# Patient Record
Sex: Male | Born: 1945
Health system: Southern US, Community
[De-identification: ages and names within clinical notes are randomized; demographics above are authoritative.]

## PROBLEM LIST (undated history)

## (undated) DIAGNOSIS — I5042 Chronic combined systolic (congestive) and diastolic (congestive) heart failure: Secondary | ICD-10-CM

## (undated) DIAGNOSIS — I10 Essential (primary) hypertension: Secondary | ICD-10-CM

## (undated) DIAGNOSIS — I214 Non-ST elevation (NSTEMI) myocardial infarction: Secondary | ICD-10-CM

## (undated) DIAGNOSIS — I509 Heart failure, unspecified: Secondary | ICD-10-CM

## (undated) DIAGNOSIS — N189 Chronic kidney disease, unspecified: Secondary | ICD-10-CM

## (undated) DIAGNOSIS — E785 Hyperlipidemia, unspecified: Secondary | ICD-10-CM

## (undated) DIAGNOSIS — N183 Chronic kidney disease, stage 3 unspecified: Secondary | ICD-10-CM

## (undated) DIAGNOSIS — L97919 Non-pressure chronic ulcer of unspecified part of right lower leg with unspecified severity: Secondary | ICD-10-CM

## (undated) DIAGNOSIS — I2510 Atherosclerotic heart disease of native coronary artery without angina pectoris: Secondary | ICD-10-CM

## (undated) DIAGNOSIS — D649 Anemia, unspecified: Secondary | ICD-10-CM

## (undated) DIAGNOSIS — I739 Peripheral vascular disease, unspecified: Secondary | ICD-10-CM

## (undated) DIAGNOSIS — N179 Acute kidney failure, unspecified: Secondary | ICD-10-CM

## (undated) HISTORY — DX: Atherosclerotic heart disease of native coronary artery without angina pectoris: I25.10

## (undated) HISTORY — DX: Hyperlipidemia, unspecified: E78.5

## (undated) HISTORY — DX: Essential (primary) hypertension: I10

## (undated) HISTORY — DX: Chronic combined systolic (congestive) and diastolic (congestive) heart failure: I50.42

## (undated) HISTORY — DX: Anemia, unspecified: D64.9

## (undated) HISTORY — DX: Peripheral vascular disease, unspecified: I73.9

## (undated) HISTORY — DX: Chronic kidney disease, stage 3 unspecified: N18.30

## (undated) HISTORY — DX: Non-ST elevation (NSTEMI) myocardial infarction: I21.4

## (undated) HISTORY — PX: COLONOSCOPY: SHX5424

## (undated) HISTORY — DX: Non-pressure chronic ulcer of unspecified part of right lower leg with unspecified severity: L97.919

---
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
## 2020-01-05 ENCOUNTER — Other Ambulatory Visit: Payer: Self-pay

## 2020-01-05 ENCOUNTER — Ambulatory Visit (INDEPENDENT_AMBULATORY_CARE_PROVIDER_SITE_OTHER): Payer: Medicare Other | Admitting: Primary Care

## 2020-01-05 ENCOUNTER — Encounter: Payer: Self-pay | Admitting: Primary Care

## 2020-01-05 ENCOUNTER — Other Ambulatory Visit (INDEPENDENT_AMBULATORY_CARE_PROVIDER_SITE_OTHER): Payer: Self-pay | Admitting: Vascular Surgery

## 2020-01-05 ENCOUNTER — Ambulatory Visit (INDEPENDENT_AMBULATORY_CARE_PROVIDER_SITE_OTHER): Payer: Medicare Other | Admitting: Cardiovascular Disease

## 2020-01-05 ENCOUNTER — Encounter: Payer: Self-pay | Admitting: Cardiovascular Disease

## 2020-01-05 VITALS — BP 180/120 | HR 77 | Temp 97.2°F | Ht 71.0 in | Wt 144.2 lb

## 2020-01-05 VITALS — BP 140/80 | HR 68 | Ht 71.0 in | Wt 138.0 lb

## 2020-01-05 DIAGNOSIS — R7989 Other specified abnormal findings of blood chemistry: Secondary | ICD-10-CM | POA: Insufficient documentation

## 2020-01-05 DIAGNOSIS — I252 Old myocardial infarction: Secondary | ICD-10-CM | POA: Diagnosis not present

## 2020-01-05 DIAGNOSIS — I10 Essential (primary) hypertension: Secondary | ICD-10-CM

## 2020-01-05 DIAGNOSIS — L97909 Non-pressure chronic ulcer of unspecified part of unspecified lower leg with unspecified severity: Secondary | ICD-10-CM

## 2020-01-05 DIAGNOSIS — I739 Peripheral vascular disease, unspecified: Secondary | ICD-10-CM | POA: Insufficient documentation

## 2020-01-05 DIAGNOSIS — I509 Heart failure, unspecified: Secondary | ICD-10-CM | POA: Diagnosis not present

## 2020-01-05 DIAGNOSIS — N183 Chronic kidney disease, stage 3 unspecified: Secondary | ICD-10-CM | POA: Insufficient documentation

## 2020-01-05 DIAGNOSIS — N189 Chronic kidney disease, unspecified: Secondary | ICD-10-CM

## 2020-01-05 DIAGNOSIS — L97912 Non-pressure chronic ulcer of unspecified part of right lower leg with fat layer exposed: Secondary | ICD-10-CM | POA: Diagnosis not present

## 2020-01-05 DIAGNOSIS — I5022 Chronic systolic (congestive) heart failure: Secondary | ICD-10-CM

## 2020-01-05 DIAGNOSIS — I5030 Unspecified diastolic (congestive) heart failure: Secondary | ICD-10-CM | POA: Insufficient documentation

## 2020-01-05 LAB — LIPID PANEL
Cholesterol: 133 mg/dL (ref 0–200)
HDL: 43.7 mg/dL (ref >39.00)
LDL Cholesterol: 77 mg/dL (ref 0–99)
NonHDL: 89.01
Total CHOL/HDL Ratio: 3
Triglycerides: 62 mg/dL (ref 0.0–149.0)
VLDL: 12.4 mg/dL (ref 0.0–40.0)

## 2020-01-05 LAB — COMPREHENSIVE METABOLIC PANEL
ALT: 14 U/L (ref 0–53)
AST: 22 U/L (ref 0–37)
Albumin: 3 g/dL — ABNORMAL LOW (ref 3.5–5.2)
Alkaline Phosphatase: 55 U/L (ref 39–117)
BUN: 36 mg/dL — ABNORMAL HIGH (ref 6–23)
CO2: 26 mEq/L (ref 19–32)
Calcium: 8.7 mg/dL (ref 8.4–10.5)
Chloride: 111 mEq/L (ref 96–112)
Creatinine, Ser: 2.03 mg/dL — ABNORMAL HIGH (ref 0.40–1.50)
GFR: 39.07 mL/min — ABNORMAL LOW (ref >60.00)
Glucose, Bld: 93 mg/dL (ref 70–99)
Potassium: 4.1 mEq/L (ref 3.5–5.1)
Sodium: 144 mEq/L (ref 135–145)
Total Bilirubin: 0.3 mg/dL (ref 0.2–1.2)
Total Protein: 7.9 g/dL (ref 6.0–8.3)

## 2020-01-05 LAB — CBC WITH DIFFERENTIAL/PLATELET
Basophils Absolute: 0 10*3/uL (ref 0.0–0.1)
Basophils Relative: 0.8 % (ref 0.0–3.0)
Eosinophils Absolute: 0.1 10*3/uL (ref 0.0–0.7)
Eosinophils Relative: 3 % (ref 0.0–5.0)
HCT: 28.6 % — ABNORMAL LOW (ref 39.0–52.0)
Hemoglobin: 8.9 g/dL — ABNORMAL LOW (ref 13.0–17.0)
Lymphocytes Relative: 27.5 % (ref 12.0–46.0)
Lymphs Abs: 1.3 10*3/uL (ref 0.7–4.0)
MCHC: 31 g/dL (ref 30.0–36.0)
MCV: 78.2 fl (ref 78.0–100.0)
Monocytes Absolute: 0.6 10*3/uL (ref 0.1–1.0)
Monocytes Relative: 11.7 % (ref 3.0–12.0)
Neutro Abs: 2.7 10*3/uL (ref 1.4–7.7)
Neutrophils Relative %: 57 % (ref 43.0–77.0)
Platelets: 268 10*3/uL (ref 150.0–400.0)
RBC: 3.66 Mil/uL — ABNORMAL LOW (ref 4.22–5.81)
RDW: 21.3 % — ABNORMAL HIGH (ref 11.5–15.5)
WBC: 4.8 10*3/uL (ref 4.0–10.5)

## 2020-01-05 LAB — TSH: TSH: 4.17 u[IU]/mL (ref 0.35–4.50)

## 2020-01-05 MED ORDER — ASPIRIN EC 81 MG PO TBEC: 81.0000 mg | DELAYED_RELEASE_TABLET | Freq: Every day | ORAL | Status: DC

## 2020-01-05 MED ORDER — AMLODIPINE BESYLATE 5 MG PO TABS: 5.0000 mg | ORAL_TABLET | Freq: Every day | ORAL | 5 refills | Status: DC

## 2020-01-05 NOTE — Assessment & Plan Note (Addendum)
Chronic for years, previously following with vascular services around 2007, no evaluation since.  Numerous ulcers to lower extremities, worse to right side. No obvious infection today. Referral placed to wound center for ulcer care, referral placed to vascular services for evaluation.  Will scan in LE arterial seg pressure report.

## 2020-01-05 NOTE — Assessment & Plan Note (Signed)
Uncontrolled today on current regimen. No recent renal function studies on file. History of NSTEMI with AKI and underlying (untreated CKD).  Stat referral to cardiology, will be seen today. Labs pending.

## 2020-01-05 NOTE — Assessment & Plan Note (Signed)
Undiagnosed until just recently. Renal US reviewed from hospital stay. Suspect CKD is secondary to uncontrolled hypertension.  Renal function testing pending. Patient to see cardiology today, we need to gain better BP control but given his recent history will defer to cards. He may need nephrology evaluation.

## 2020-01-05 NOTE — Patient Instructions (Signed)
Medication Instructions:  Your physician has recommended you make the following change in your medication:   START Aspirin 81 mg daily. This can be purchased over the counter.  START Amlodipine 5 mg daily. An Rx has been sent to your pharmacy.   *If you need a refill on your cardiac medications before your next appointment, please call your pharmacy*   Lab Work: None ordered If you have labs (blood work) drawn today and your tests are completely normal, you will receive your results only by: [name] MyChart Message (if you have MyChart) OR . A paper copy in the mail If you have any lab test that is abnormal or we need to change your treatment, we will call you to review the results.   Testing/Procedures: None ordered   Follow-Up: At [facility], you and your health needs are our priority.  As part of our continuing mission to provide you with exceptional heart care, we have created designated Provider Care Teams.  These Care Teams include your primary Cardiologist (physician) and Advanced Practice Providers (APPs -  Physician Assistants and Nurse Practitioners) who all work together to provide you with the care you need, when you need it.  We recommend signing up for the patient portal called "MyChart".  Sign up information is provided on this After Visit Summary.  MyChart is used to connect with patients for Virtual Visits (Telemedicine).  Patients are able to view lab/test results, encounter notes, upcoming appointments, etc.  Non-urgent messages can be sent to your provider as well.   To learn more about what you can do with MyChart, go to [email or web address].    Your next appointment:   2 month(s)  The format for your next appointment:   In Person  Provider:    You may see  Dr. [name] or one of the following Advanced Practice Providers on your designated Care Team:    Murray Hodgkins, NP  Christell Faith, PA-C  Marrianne Mood, PA-C    Other Instructions N/A

## 2020-01-05 NOTE — Assessment & Plan Note (Addendum)
Diagnosed during recent hospital admission in [place], echocardiogram reviewed. No recent use of furosemide per patient due to symptoms of frequent urination.   Continue carvedilol and isosorbide mononitrate.   Patient to see cardiology this afternoon.

## 2020-01-05 NOTE — Progress Notes (Signed)
Cardiology Office Note   Date:  01/05/2020   ID:  Narayan Scull, DOB 11/14/1945, MRN [id number]  PCP:  [name], NP  Cardiologist:   [name], MD   Chief Complaint  Patient presents with  . New Patient (Initial Visit)    Patient c/o some palpitations at time in his chest and SOB when walking. Meds reviewed verbally with patient.       History of Present Illness: Azariah Bonura is a 74 y.o. male who was referred by Alma Friendly to establish cardiovascular care.  The patient moved from [place] to [place] to be with his sister.  She had self-neglect there as he is nearly blind.  He also has history of peripheral arterial disease with lower extremity ulceration but ABI in March was normal. The patient did not seek medical attention for many years at least since 2007 and was living in a poor living condition by himself. He was diagnosed with heart failure in [place] and March 2021 with an EF of 40 to 45%.  He did have non-ST elevation myocardial infarction but was treated medically.  He does have chronic kidney disease with creatinine of 2.77.  His initial presentation was with a possible syncopal episode.  He was noted to be anemic and underwent an EGD which showed moderately erythematous stomach mucosa.  Colonoscopy showed internal hemorrhoids.  It was not clear if he had acute kidney injury or whether he had chronic kidney disease. The patient also has chronic ulceration of the right lower extremity for many years that has not been addressed. He is not a smoker and quit alcohol 12 years ago.  He is not diabetic.  He is retired and used to work in Tourist information centre manager.  He has no family history of coronary artery disease.   He has been doing reasonably well overall with no recent chest pain or leg edema.  He reports stable exertional dyspnea.  Past Medical History:  Diagnosis Date  . AKI (acute kidney injury) ([place])   . CHF (congestive heart failure) ([place])   . Chronic  ulcer of right lower extremity ([place])   . CKD (chronic kidney disease)   . NSTEMI (non-ST elevated myocardial infarction) ([place])   . PVD (peripheral vascular disease) ([place])     History reviewed. No pertinent surgical history.   Current Outpatient Medications  Medication Sig Dispense Refill  . carvedilol (COREG) 12.5 MG tablet Take 12.5 mg by mouth 2 (two) times daily with a meal.    . furosemide (LASIX) 40 MG tablet Take 40 mg by mouth.    . hydrALAZINE (APRESOLINE) 100 MG tablet Take 100 mg by mouth 2 (two) times daily.    . isosorbide mononitrate (IMDUR) 30 MG 24 hr tablet Take 30 mg by mouth daily.     No current facility-administered medications for this visit.    Allergies:   Patient has no known allergies.    Social History:  The patient  reports that he has never smoked. He has never used smokeless tobacco. He reports previous alcohol use.   Family History:  The patient's family history is negative for coronary artery disease.  There is family history of hypertension.   ROS:  Please see the history of present illness.   Otherwise, review of systems are positive for none.   All other systems are reviewed and negative.    PHYSICAL EXAM: VS:  BP 140/80 (BP Location: Right Arm, Patient Position: Sitting, Cuff Size: Normal)  Pulse 68   Ht 5\' 11"  (1.803 m)   Wt 138 lb (62.6 kg)   SpO2 90%   BMI 19.25 kg/m  , BMI Body mass index is 19.25 kg/m. GEN: Well nourished, well developed, in no acute distress  HEENT: normal  Neck: no JVD, carotid bruits, or masses Cardiac: RRR; no murmurs, rubs, or gallops,no edema  Respiratory:  clear to auscultation bilaterally, normal work of breathing GI: soft, nontender, nondistended, + BS MS: no deformity or atrophy  Skin: warm and dry, no rash Neuro:  Strength and sensation are intact Psych: euthymic mood, full affect   EKG:  EKG is ordered today. The ekg ordered today demonstrates sinus rhythm with PACs, LVH with repolarization  abnormalities.   Recent Labs: No results found for requested labs within last 8760 hours.    Lipid Panel No results found for: CHOL, TRIG, HDL, CHOLHDL, VLDL, LDLCALC, LDLDIRECT    Wt Readings from Last 3 Encounters:  01/05/20 138 lb (62.6 kg)  01/05/20 144 lb 4 oz (65.4 kg)      Other studies Reviewed: Additional studies/ records that were reviewed today include: Reviewed records from his hospitalization. Review of the above records demonstrates: Summarized above  No flowsheet data found.    ASSESSMENT AND PLAN:  1.  Chronic systolic heart failure: Likely due to hypertensive heart disease.  Ischemic cardiomyopathy cannot be completely excluded.  He is not a good candidate for invasive cardiac evaluation given advanced chronic kidney disease.  In addition, he has no convincing symptoms of angina at the present time.  I recommend continued medical therapy for now.  Continue treatment with carvedilol 12.5 mg twice daily, hydralazine 100 mg twice daily and Imdur 30 mg once daily.  No ACE inhibitors, ARB's or aldosterone blocker medications due to advanced chronic kidney disease. Continue current dose of furosemide 40 mg once daily I instructed him to start aspirin 81 mg once daily.  2.  Essential hypertension: Blood pressure is still not optimally controlled.  I elected to add amlodipine 5 mg once daily.  3.  Possible hyperlipidemia.  Will await the results of blood work with low threshold to start treatment with a statin given reported recent non-ST elevation myocardial infarction.   Disposition:   FU with me in 2 months  Signed,  [name], MD  01/05/2020 3:17 PM    [place]

## 2020-01-05 NOTE — Assessment & Plan Note (Signed)
Noted on labs from 12/14/19 from hospital stay, TSH of 4.5. repeat TSH pending. No known history of thyroid disease.

## 2020-01-05 NOTE — Assessment & Plan Note (Signed)
Recent hospital admission in [place], we have very little records, but will scan in what we have.  No evidence of cardiac catheterization.  Referral placed to cardiology, he will be seeing them later today.

## 2020-01-05 NOTE — Assessment & Plan Note (Signed)
Untreated for years, no obvious infection today. He has completed antibiotic treatment. Referral placed to vascular services and wound care services.

## 2020-01-05 NOTE — Progress Notes (Signed)
Subjective:    Patient ID: Michael Chang, male    DOB: October 11, 1945, 74 y.o.   MRN: [id number]  HPI  This visit occurred during the SARS-CoV-2 public health emergency.  Safety protocols were in place, including screening questions prior to the visit, additional usage of staff PPE, and extensive cleaning of exam room while observing appropriate contact time as indicated for disinfecting solutions.   Michael Chang is a 74 year old male who presents today to establish care and discuss the problems mentioned below. Will obtain/review records.   He is here with Michael Chang who is providing most of the information for HPI. She moved him from Michael home in [place] to her home here in [place] due to self neglect while living by himself. The patient is nearly blind, cannot take care of himself.   1) CHF: Appears to be new diagnosis from hospital stay in [place]. Echocardiogram from March 2021 with LVEF of 40-45%, increased left atrial pressure with reduced LV compliance, mild to moderate mitral regurgitation and tricuspid regurgitation. Chest xray with pulmonary edema. Currently managed on furosemide 40 mg. Diagnosed with NSTEMI at the time.   He is not taking furosemide as it causes him to have to urinate often. He does not have a local cardiologist. Compliant to carvedilol, isosorbide mononitrate. He denies lower extremity edema.   2) CKD: New diagnosis but appears to be a chronic issue. Creatinine of 2.77 with GFR of 27 from December 14, 2019. No recent labs. Hypertensive in the office today, no known diabetes. Renal ultrasound with bilateral renal cysts, and evidence of renal disease.   3) PVD/Chronic Ulcer: Chronic ulcer to right lower extremity that has been present since 1997. ABI's from hospital stay in March 2021 negative for arterial disease. Xray of right ankle without osteomyelitis   No medical care since 2007. He typically covers the site with pads and covers with neosporin. Treated with Amoxicillin  BID and clarithromycin BID x 14 days. Michael Chang endorses that Michael wound does appear better. He has another minor wound to right lateral ankle and left medial ankle.   4) Syncope/NSTEMI: Recent episode of syncope occurring at a [employer] , fell and hit Michael head on the refrigerator, woke up seconds later. Witnessed by bystandards. Patient evaluated at [facility] on 12/14/19. Admitted for syncope, NSTEMI, AKI, Hypertensive Urgency. Also noted to be covered in bed bugs upon arrival.  We have very little records from this visit but he was admitted to [facility] on 12/14/19, discharged home on 12/21/19 and moved in with Michael Chang who is Michael caregiver. He underwent upper and lower endoscopy, gastric polyps removed. Internal hemorrhoids on colonoscopy, otherwise unremarkable and negative for GI bleed. No evidence of cardiac catheterization from records.   5) Essential Hypertension: Currently managed on carvedilol 12.5 mg BID, hydralazine 100 mg BID, isosorbide mononitrite ER 30 mg daily. These medications were initiated during Michael hospital stay in [place]. Currently does not follow with cardiology, has not had medical care in over 10 years. Michael Chang is checking Michael BP at home which is running 170's-220/80's-110's.   BP Readings from Last 3 Encounters:  01/05/20 (!) 180/120      Review of Systems  Eyes:       Nearly blind  Respiratory: Negative for shortness of breath.   Cardiovascular: Negative for chest pain.  Gastrointestinal: Negative for abdominal pain.  Musculoskeletal: Positive for back pain.  Skin: Positive for wound.  Neurological: Negative for headaches.       Past Medical History:  Diagnosis Date  . AKI (acute kidney injury) ([place])   . CHF (congestive heart failure) ([place])   . Chronic ulcer of right lower extremity ([place])   . CKD (chronic kidney disease)   . NSTEMI (non-ST elevated myocardial infarction) ([place])   . PVD (peripheral  vascular disease) ([place])      Social History   Socioeconomic History  . Marital status: Single    Spouse name: Not on file  . Number of children: Not on file  . Years of education: Not on file  . Highest education level: Not on file  Occupational History  . Not on file  Tobacco Use  . Smoking status: Never Smoker  . Smokeless tobacco: Never Used  Substance and Sexual Activity  . Alcohol use: Not Currently  . Drug use: Not on file  . Sexual activity: Not on file  Other Topics Concern  . Not on file  Social History Narrative  . Not on file   Social Determinants of Health   Financial Resource Strain:   . Difficulty of Paying Living Expenses:   Food Insecurity:   . Worried About Charity fundraiser in the Last Year:   . Arboriculturist in the Last Year:   Transportation Needs:   . Film/video editor (Medical):   [name] Lack of Transportation (Non-Medical):   Physical Activity:   . Days of Exercise per Week:   . Minutes of Exercise per Session:   Stress:   . Feeling of Stress :   Social Connections:   . Frequency of Communication with Friends and Family:   . Frequency of Social Gatherings with Friends and Family:   . Attends Religious Services:   . Active Member of Clubs or Organizations:   . Attends Archivist Meetings:   [name] Marital Status:   Intimate Partner Violence:   . Fear of Current or Ex-Partner:   . Emotionally Abused:   [name] Physically Abused:   . Sexually Abused:     History reviewed. No pertinent surgical history.  No family history on file.  No Known Allergies  Current Outpatient Medications on File Prior to Visit  Medication Sig Dispense Refill  . amoxicillin (AMOXIL) 500 MG tablet Take 500 mg by mouth 2 (two) times daily.    . carvedilol (COREG) 12.5 MG tablet Take 12.5 mg by mouth 2 (two) times daily with a meal.    . furosemide (LASIX) 40 MG tablet Take 40 mg by mouth.    . hydrALAZINE (APRESOLINE) 100 MG tablet Take 100 mg by mouth 2  (two) times daily.    . isosorbide mononitrate (IMDUR) 30 MG 24 hr tablet Take 30 mg by mouth daily.     No current facility-administered medications on file prior to visit.    BP (!) 180/120   Pulse 77   Temp (!) 97.2 F (36.2 C) (Temporal)   Ht 5\' 11"  (1.803 m)   Wt 144 lb 4 oz (65.4 kg)   SpO2 98%   BMI 20.12 kg/m    Objective:   Physical Exam  Constitutional: He appears well-nourished.  Cardiovascular: Normal rate and regular rhythm.  Respiratory: Effort normal and breath sounds normal.  Musculoskeletal:     Cervical back: Neck supple.  Neurological: He is alert.  Answers questions correctly   Skin:  Moderately sized ulcer to right medial ankle with fat layer exposure. No foul smell.  Smaller superficial ulcer to right lateral ankle.   Psychiatric: He has a normal mood and affect.           Assessment & Plan:

## 2020-01-05 NOTE — Patient Instructions (Signed)
Stop by the lab prior to leaving today. I will notify you of your results once received.   Stop by the front desk and speak with either Rosaria Ferries or Charmaine regarding your referral to Cardiology.  It was a pleasure to meet you today! Please don't hesitate to call or message me with any questions. Welcome to [facility]!

## 2020-01-07 ENCOUNTER — Ambulatory Visit (INDEPENDENT_AMBULATORY_CARE_PROVIDER_SITE_OTHER): Payer: Medicare Other

## 2020-01-07 ENCOUNTER — Encounter: Payer: Medicare Other | Attending: Physician Assistant | Admitting: Physician Assistant

## 2020-01-07 ENCOUNTER — Other Ambulatory Visit: Payer: Self-pay

## 2020-01-07 ENCOUNTER — Encounter (INDEPENDENT_AMBULATORY_CARE_PROVIDER_SITE_OTHER): Payer: Self-pay | Admitting: Vascular Surgery

## 2020-01-07 ENCOUNTER — Ambulatory Visit (INDEPENDENT_AMBULATORY_CARE_PROVIDER_SITE_OTHER): Payer: Medicare Other | Admitting: Vascular Surgery

## 2020-01-07 VITALS — BP 144/75 | HR 44 | Ht 71.0 in | Wt <= 1120 oz

## 2020-01-07 DIAGNOSIS — I890 Lymphedema, not elsewhere classified: Secondary | ICD-10-CM | POA: Diagnosis not present

## 2020-01-07 DIAGNOSIS — I2510 Atherosclerotic heart disease of native coronary artery without angina pectoris: Secondary | ICD-10-CM | POA: Insufficient documentation

## 2020-01-07 DIAGNOSIS — I739 Peripheral vascular disease, unspecified: Secondary | ICD-10-CM

## 2020-01-07 DIAGNOSIS — L97312 Non-pressure chronic ulcer of right ankle with fat layer exposed: Secondary | ICD-10-CM | POA: Insufficient documentation

## 2020-01-07 DIAGNOSIS — L97522 Non-pressure chronic ulcer of other part of left foot with fat layer exposed: Secondary | ICD-10-CM | POA: Diagnosis present

## 2020-01-07 DIAGNOSIS — L97909 Non-pressure chronic ulcer of unspecified part of unspecified lower leg with unspecified severity: Secondary | ICD-10-CM | POA: Diagnosis not present

## 2020-01-07 DIAGNOSIS — I25118 Atherosclerotic heart disease of native coronary artery with other forms of angina pectoris: Secondary | ICD-10-CM | POA: Diagnosis not present

## 2020-01-07 DIAGNOSIS — I10 Essential (primary) hypertension: Secondary | ICD-10-CM

## 2020-01-07 DIAGNOSIS — N186 End stage renal disease: Secondary | ICD-10-CM | POA: Diagnosis not present

## 2020-01-07 DIAGNOSIS — I872 Venous insufficiency (chronic) (peripheral): Secondary | ICD-10-CM | POA: Insufficient documentation

## 2020-01-07 DIAGNOSIS — I120 Hypertensive chronic kidney disease with stage 5 chronic kidney disease or end stage renal disease: Secondary | ICD-10-CM | POA: Diagnosis not present

## 2020-01-07 DIAGNOSIS — L97529 Non-pressure chronic ulcer of other part of left foot with unspecified severity: Secondary | ICD-10-CM

## 2020-01-07 DIAGNOSIS — L97519 Non-pressure chronic ulcer of other part of right foot with unspecified severity: Secondary | ICD-10-CM | POA: Insufficient documentation

## 2020-01-07 NOTE — Progress Notes (Signed)
MRN : [id number]  Michael Chang is a 74 y.o. (04/08/1946) male who presents with chief complaint of  Chief Complaint  Patient presents with  . Follow-up    ultrasound follow up   .  History of Present Illness:   The patient is seen for evaluation of painful lower extremities and diminished pulses associated with ulceration of the foot.  The patient notes the ulcer has been present for multiple weeks and has not been improving.  It is very painful and has had some drainage.  No specific history of trauma noted by the patient.  The patient denies fever or chills.  the patient does have diabetes which has been difficult to control.  Patient notes prior to the ulcer developing the extremities were painful particularly with ambulation or activity and the discomfort is very consistent day today. Typically, the pain occurs at less than one block, progress is as activity continues to the point that the patient must stop walking. Resting including standing still for several minutes allowed resumption of the activity and the ability to walk a similar distance before stopping again. Uneven terrain and inclined shorten the distance. The pain has been progressive over the past several years.   The patient denies rest pain or dangling of an extremity off the side of the bed during the night for relief. No prior interventions or surgeries.  No history of back problems or DJD of the lumbar sacral spine.   The patient denies amaurosis fugax or recent TIA symptoms. There are no recent neurological changes noted. The patient denies history of DVT, PE or superficial thrombophlebitis. The patient denies recent episodes of angina or shortness of breath.   Current Meds  Medication Sig  . amLODipine (NORVASC) 5 MG tablet Take 1 tablet (5 mg total) by mouth daily.  [name] aspirin EC 81 MG tablet Take 1 tablet (81 mg total) by mouth daily.  . carvedilol (COREG) 12.5 MG tablet Take 12.5 mg by mouth 2 (two) times  daily with a meal.  . furosemide (LASIX) 40 MG tablet Take 40 mg by mouth.  . hydrALAZINE (APRESOLINE) 100 MG tablet Take 100 mg by mouth 2 (two) times daily.  . isosorbide mononitrate (IMDUR) 30 MG 24 hr tablet Take 30 mg by mouth daily.    Past Medical History:  Diagnosis Date  . AKI (acute kidney injury) ([place])   . CHF (congestive heart failure) ([place])   . Chronic ulcer of right lower extremity ([place])   . CKD (chronic kidney disease)   . NSTEMI (non-ST elevated myocardial infarction) ([place])   . PVD (peripheral vascular disease) ([place])     No past surgical history on file.  Social History Social History   Tobacco Use  . Smoking status: Never Smoker  . Smokeless tobacco: Never Used  Substance Use Topics  . Alcohol use: Not Currently  . Drug use: Not on file    Family History No family history of bleeding/clotting disorders, porphyria or autoimmune disease  No Known Allergies   REVIEW OF SYSTEMS (Negative unless checked)  Constitutional: [] Weight loss  [] Fever  [] Chills Cardiac: [] Chest pain   [] Chest pressure   [] Palpitations   [] Shortness of breath when laying flat   [] Shortness of breath with exertion. Vascular:  [] Pain in legs with walking   [x] Pain in legs at rest  [] History of DVT   [] Phlebitis   [] Swelling in legs   [] Varicose veins   [x] Non-healing ulcers Pulmonary:   [] Uses home oxygen   []   Productive cough   [] Hemoptysis   [] Wheeze  [] COPD   [] Asthma Neurologic:  [] Dizziness   [] Seizures   [] History of stroke   [] History of TIA  [] Aphasia   [] Vissual changes   [] Weakness or numbness in arm   [x] Weakness or numbness in leg Musculoskeletal:   [] Joint swelling   [x] Joint pain   [] Low back pain Hematologic:  [] Easy bruising  [] Easy bleeding   [] Hypercoagulable state   [] Anemic Gastrointestinal:  [] Diarrhea   [] Vomiting  [] Gastroesophageal reflux/heartburn   [] Difficulty swallowing. Genitourinary:  [] Chronic kidney disease   [] Difficult urination  [] Frequent urination    [] Blood in urine Skin:  [] Rashes   [] Ulcers  Psychological:  [] History of anxiety   []  History of major depression.  Physical Examination  Vitals:   01/07/20 1453  BP: (!) 144/75  Pulse: (!) 44  Weight: 70 lb (31.8 kg)  Height: 5\' 11"  (1.803 m)   Body mass index is 9.76 kg/m. Gen: WD/WN, NAD Head: [place]/AT, No temporalis wasting.  Ear/Nose/Throat: Hearing grossly intact, nares w/o erythema or drainage, poor dentition Eyes: PER, EOMI, sclera nonicteric.  Neck: Supple, no masses.  No bruit or JVD.  Pulmonary:  Good air movement, clear to auscultation bilaterally, no use of accessory muscles.  Cardiac: RRR, normal S1, S2, no Murmurs. Vascular: ulcers bilateral feet noninfected Vessel Right Left  Radial Palpable Palpable  PT Not Palpable Not Palpable  DP Not Palpable Not Palpable  Gastrointestinal: soft, non-distended. No guarding/no peritoneal signs.  Musculoskeletal: M/S 3/5 throughout.  No deformity but modest atrophy.  Neurologic: CN 2-12 intact. Pain and light touch intact in extremities.  Symmetrical.  Speech is fluent. Motor exam as listed above. Psychiatric: Judgment intact, Mood & affect appropriate for pt's clinical situation. Dermatologic: No rashes or ulcers noted.  No changes consistent with cellulitis.   CBC Lab Results  Component Value Date   WBC 4.8 01/05/2020   HGB 8.9 Repeated and verified X2. (L) 01/05/2020   HCT 28.6 (L) 01/05/2020   MCV 78.2 01/05/2020   PLT 268.0 01/05/2020    BMET    Component Value Date/Time   NA 144 01/05/2020 1028   K 4.1 01/05/2020 1028   CL 111 01/05/2020 1028   CO2 26 01/05/2020 1028   GLUCOSE 93 01/05/2020 1028   BUN 36 (H) 01/05/2020 1028   CREATININE 2.03 (H) 01/05/2020 1028   CALCIUM 8.7 01/05/2020 1028   Estimated Creatinine Clearance: 14.6 mL/min (A) (by C-G formula based on SCr of 2.03 mg/dL (H)).  COAG No results found for: INR, PROTIME  Radiology No results found.   Assessment/Plan 1. Skin ulcers of  foot, bilateral ([place]) Continue dressing as noted Will refer to Podiatry  2. PVD (peripheral vascular disease) ([place])  Recommend:  The patient has evidence of atherosclerosis of the lower extremities.  Noninvasive studies suggest good perfusion with triphasic signals bilaterally and good toe tracing.  The patient does not voice lifestyle limiting changes at this point in time.  No invasive studies, angiography or surgery at this time The patient should continue walking and begin a more formal exercise program.  The patient should continue antiplatelet therapy and aggressive treatment of the lipid abnormalities  No changes in the patient's medications at this time  3. Essential hypertension Continue antihypertensive medications as already ordered, these medications have been reviewed and there are no changes at this time.   4. Coronary artery disease of native artery of native heart with stable angina pectoris (HCC) Continue cardiac and antihypertensive medications  as already ordered and reviewed, no changes at this time.  Continue statin as ordered and reviewed, no changes at this time  Nitrates PRN for chest pain     Hortencia Pilar, MD  01/07/2020 3:17 PM

## 2020-01-08 ENCOUNTER — Telehealth: Payer: Self-pay | Admitting: Primary Care

## 2020-01-08 ENCOUNTER — Ambulatory Visit: Payer: Medicare Other | Admitting: Physician Assistant

## 2020-01-08 NOTE — Telephone Encounter (Signed)
Patient's sister returned your call  Unable to attache to lab encounter

## 2020-01-08 NOTE — Progress Notes (Signed)
BOBBI, KOZAKIEWICZ ([id number]) Visit Report for 01/07/2020 Abuse/Suicide Risk Screen Details Patient Name: Michael Chang, Michael Chang Date of Service: 01/07/2020 9:45 AM Medical Record Number: [id number] Patient Account Number: [id number] Date of Birth/Sex: [birth year] (74 y.o. M) Treating RN: Montey Hora Primary Care Mar Zettler: Alma Friendly Other Clinician: Referring Malaika Arnall: Alma Friendly Treating Derrisha Foos/Extender: Melburn Hake, HOYT Weeks in Treatment: 0 Abuse/Suicide Risk Screen Items Answer ABUSE RISK SCREEN: Has anyone close to you tried to hurt or harm you recentlyo No Do you feel uncomfortable with anyone in your familyo No Has anyone forced you do things that you didnot want to doo No Electronic Signature(s) Signed: 01/07/2020 4:13:45 PM By: Montey Hora Entered By: Montey Hora on 01/07/2020 09:49:41 Michael Chang ([id number]) -------------------------------------------------------------------------------- Activities of Daily Living Details Patient Name: Michael Chang Date of Service: 01/07/2020 9:45 AM Medical Record Number: [id number] Patient Account Number: [id number] Date of Birth/Sex: [birth year] (73 y.o. M) Treating RN: Montey Hora Primary Care Joliana Claflin: Alma Friendly Other Clinician: Referring Samina Weekes: Alma Friendly Treating Devean Skoczylas/Extender: Melburn Hake, HOYT Weeks in Treatment: 0 Activities of Daily Living Items Answer Activities of Daily Living (Please select one for each item) Drive Automobile Not Able Take Medications Need Assistance Use Telephone Completely Able Care for Appearance Completely Able Use Toilet Completely Able Bath / Shower Need Assistance Dress Self Completely Able Feed Self Completely Able Walk Need Assistance Get In / Out Bed Completely [place] for Self Need Assistance Electronic Signature(s) Signed: 01/07/2020 4:13:45 PM By: Montey Hora Entered  By: Montey Hora on 01/07/2020 09:50:37 Michael Chang ([id number]) -------------------------------------------------------------------------------- Education Screening Details Patient Name: Michael Chang Date of Service: 01/07/2020 9:45 AM Medical Record Number: [id number] Patient Account Number: [id number] Date of Birth/Sex: 1946/05/16 (73 y.o. M) Treating RN: Montey Hora Primary Care Leylani Duley: Alma Friendly Other Clinician: Referring Akita Maxim: Alma Friendly Treating Jashley Yellin/Extender: Melburn Hake, HOYT Weeks in Treatment: 0 Primary Learner Assessed: Patient Learning Preferences/Education Level/Primary Language Learning Preference: Explanation, Demonstration Highest Education Level: High School Preferred Language: English Cognitive Barrier Language Barrier: No Translator Needed: No Memory Deficit: No Emotional Barrier: No Cultural/Religious Beliefs Affecting Medical Care: No Physical Barrier Impaired Vision: No Impaired Hearing: No Decreased Hand dexterity: No Knowledge/Comprehension Knowledge Level: Medium Comprehension Level: Medium Ability to understand written instructions: Medium Ability to understand verbal instructions: Medium Motivation Anxiety Level: Calm Cooperation: Cooperative Education Importance: Acknowledges Need Interest in Health Problems: Asks Questions Perception: Coherent Willingness to Engage in Self-Management Medium Activities: Readiness to Engage in Self-Management Medium Activities: Electronic Signature(s) Signed: 01/07/2020 4:13:45 PM By: Montey Hora Entered By: Montey Hora on 01/07/2020 09:50:59 Michael Chang ([id number]) -------------------------------------------------------------------------------- Fall Risk Assessment Details Patient Name: Michael Chang Date of Service: 01/07/2020 9:45 AM Medical Record Number: [id number] Patient Account Number: [id number] Date of Birth/Sex: 07/29/1946 (73 y.o. M) Treating RN: Montey Hora Primary Care Lassie Demorest: Alma Friendly Other Clinician: Referring Jeanifer Halliday: Alma Friendly Treating Arrietty Dercole/Extender: Melburn Hake, HOYT Weeks in Treatment: 0 Fall Risk Assessment Items Have you had 2 or more falls in the last 12 monthso 0 No Have you had any fall that resulted in injury in the last 12 monthso 0 No FALLS RISK SCREEN History of falling - immediate or within 3 months 25 Yes Secondary diagnosis (Do you have 2 or more medical diagnoseso) 0 No Ambulatory aid None/bed rest/wheelchair/nurse 0 No Crutches/cane/walker 15 Yes Furniture 0 No Intravenous therapy Access/Saline/Heparin Lock 0 No Gait/Transferring Normal/ bed rest/ wheelchair 0 No Weak (short steps with or without shuffle, stooped but able to  lift head while walking, may seek 10 Yes support from furniture) Impaired (short steps with shuffle, may have difficulty arising from chair, head down, impaired 0 No balance) Mental Status Oriented to own ability 0 Yes Electronic Signature(s) Signed: 01/07/2020 4:13:45 PM By: Montey Hora Entered By: Montey Hora on 01/07/2020 09:55:21 Michael Chang ([id number]) -------------------------------------------------------------------------------- Nutrition Risk Screening Details Patient Name: Michael Chang Date of Service: 01/07/2020 9:45 AM Medical Record Number: [id number] Patient Account Number: [id number] Date of Birth/Sex: June 28, 1946 (73 y.o. M) Treating RN: Montey Hora Primary Care Markham Dumlao: Alma Friendly Other Clinician: Referring Marjean Imperato: Alma Friendly Treating Gailene Youkhana/Extender: Melburn Hake, HOYT Weeks in Treatment: 0 Height (in): Weight (lbs): Body Mass Index (BMI): Nutrition Risk Screening Items Score Screening NUTRITION RISK SCREEN: I have an illness or condition that made me change the kind and/or amount of food I eat 0 No I eat fewer than two meals per day 0 No I eat few fruits and vegetables, or milk products 0 No I have three or more  drinks of beer, liquor or wine almost every day 0 No I have tooth or mouth problems that make it hard for me to eat 0 No I don't always have enough money to buy the food I need 0 No I eat alone most of the time 0 No I take three or more different prescribed or over-the-counter drugs a day 1 Yes Without wanting to, I have lost or gained 10 pounds in the last six months 0 No I am not always physically able to shop, cook and/or feed myself 0 No Nutrition Protocols Good Risk Protocol 0 No interventions needed Moderate Risk Protocol High Risk Proctocol Risk Level: Good Risk Score: 1 Electronic Signature(s) Signed: 01/07/2020 4:13:45 PM By: Montey Hora Entered By: Montey Hora on 01/07/2020 09:55:31

## 2020-01-08 NOTE — Progress Notes (Addendum)
JJESUS, DINGLEY ([id number]) Visit Report for 01/07/2020 Allergy List Details Patient Name: Michael Chang, Michael Chang Date of Service: 01/07/2020 9:45 AM Medical Record Number: [id number] Patient Account Number: [id number] Date of Birth/Sex: 1946-04-12 (74 y.o. M) Treating RN: Montey Hora Primary Care Aranza Geddes: Alma Friendly Other Clinician: Referring Jemmie Ledgerwood: Alma Friendly Treating Arham Symmonds/Extender: Melburn Hake, HOYT Weeks in Treatment: 0 Allergies Active Allergies furosemide Reaction: diarrhea Allergy Notes Electronic Signature(s) Signed: 01/07/2020 4:13:45 PM By: Montey Hora Entered By: Montey Hora on 01/07/2020 09:49:33 Michael Chang ([id number]) -------------------------------------------------------------------------------- Arrival Information Details Patient Name: Michael Chang Date of Service: 01/07/2020 9:45 AM Medical Record Number: [id number] Patient Account Number: [id number] Date of Birth/Sex: [birth year] (73 y.o. M) Treating RN: Montey Hora Primary Care Deasha Clendenin: Alma Friendly Other Clinician: Referring Ling Flesch: Alma Friendly Treating Josedaniel Haye/Extender: Melburn Hake, HOYT Weeks in Treatment: 0 Visit Information Patient Arrived: Wheel Chair Arrival Time: 09:47 Accompanied By: sister Transfer Assistance: None Patient Identification Verified: Yes Secondary Verification Process Completed: Yes Electronic Signature(s) Signed: 01/07/2020 4:13:45 PM By: Montey Hora Entered By: Montey Hora on 01/07/2020 09:48:51 Michael Chang ([id number]) -------------------------------------------------------------------------------- Clinic Level of Care Assessment Details Patient Name: Michael Chang Date of Service: 01/07/2020 9:45 AM Medical Record Number: [id number] Patient Account Number: [id number] Date of Birth/Sex: Dec 04, 1945 (73 y.o. M) Treating RN: Army Melia Primary Care Davinci Glotfelty: Alma Friendly Other Clinician: Referring Bettylee Feig: Alma Friendly Treating  Imanii Gosdin/Extender: Melburn Hake, HOYT Weeks in Treatment: 0 Clinic Level of Care Assessment Items TOOL 2 Quantity Score []  - Use when only an EandM is performed on the INITIAL visit 0 ASSESSMENTS - Nursing Assessment / Reassessment X - General Physical Exam (combine w/ comprehensive assessment (listed just below) when performed on new pt. 1 20 evals) X- 1 25 Comprehensive Assessment (HX, ROS, Risk Assessments, Wounds Hx, etc.) ASSESSMENTS - Wound and Skin Assessment / Reassessment []  - Simple Wound Assessment / Reassessment - one wound 0 X- 3 5 Complex Wound Assessment / Reassessment - multiple wounds []  - 0 Dermatologic / Skin Assessment (not related to wound area) ASSESSMENTS - Ostomy and/or Continence Assessment and Care []  - Incontinence Assessment and Management 0 []  - 0 Ostomy Care Assessment and Management (repouching, etc.) PROCESS - Coordination of Care X - Simple Patient / Family Education for ongoing care 1 15 []  - 0 Complex (extensive) Patient / Family Education for ongoing care []  - 0 Staff obtains Programmer, systems, Records, Test Results / Process Orders []  - 0 Staff telephones HHA, Nursing Homes / Clarify orders / etc []  - 0 Routine Transfer to another Facility (non-emergent condition) []  - 0 Routine Hospital Admission (non-emergent condition) []  - 0 New Admissions / Biomedical engineer / Ordering NPWT, Apligraf, etc. []  - 0 Emergency Hospital Admission (emergent condition) X- 1 10 Simple Discharge Coordination []  - 0 Complex (extensive) Discharge Coordination PROCESS - Special Needs []  - Pediatric / Minor Patient Management 0 []  - 0 Isolation Patient Management []  - 0 Hearing / Language / Visual special needs []  - 0 Assessment of Community assistance (transportation, D/C planning, etc.) []  - 0 Additional assistance / Altered mentation []  - 0 Support Surface(s) Assessment (bed, cushion, seat, etc.) INTERVENTIONS - Wound Cleansing / Measurement X - Wound  Imaging (photographs - any number of wounds) 1 5 Beets, Shirley ([id number]) []  - 0 Wound Tracing (instead of photographs) []  - 0 Simple Wound Measurement - one wound X- 3 5 Complex Wound Measurement - multiple wounds []  - 0 Simple Wound Cleansing - one wound X- 3 5 Complex Wound Cleansing - multiple wounds INTERVENTIONS - Wound  Dressings []  - Small Wound Dressing one or multiple wounds 0 X- 3 15 Medium Wound Dressing one or multiple wounds []  - 0 Large Wound Dressing one or multiple wounds []  - 0 Application of Medications - injection INTERVENTIONS - Miscellaneous []  - External ear exam 0 []  - 0 Specimen Collection (cultures, biopsies, blood, body fluids, etc.) []  - 0 Specimen(s) / Culture(s) sent or taken to Lab for analysis []  - 0 Patient Transfer (multiple staff / Civil Service fast streamer / Similar devices) []  - 0 Simple Staple / Suture removal (25 or less) []  - 0 Complex Staple / Suture removal (26 or more) []  - 0 Hypo / Hyperglycemic Management (close monitor of Blood Glucose) X- 1 15 Ankle / Brachial Index (ABI) - do not check if billed separately Has the patient been seen at the hospital within the last three years: Yes Total Score: 180 Level Of Care: New/Established - Level 5 Electronic Signature(s) Signed: 01/08/2020 10:29:33 AM By: Army Melia Previous Signature: 01/07/2020 1:30:06 PM Version By: Army Melia Entered By: Army Melia on 01/08/2020 10:26:11 Michael Chang ([id number]) -------------------------------------------------------------------------------- Encounter Discharge Information Details Patient Name: Michael Chang Date of Service: 01/07/2020 9:45 AM Medical Record Number: [id number] Patient Account Number: [id number] Date of Birth/Sex: 12-30-1945 (74 y.o. M) Treating RN: Army Melia Primary Care Jesstin Studstill: Alma Friendly Other Clinician: Referring Tamara Kenyon: Alma Friendly Treating Yves Fodor/Extender: Melburn Hake, HOYT Weeks in Treatment: 0 Encounter  Discharge Information Items Discharge Condition: Stable Ambulatory Status: Wheelchair Discharge Destination: Home Transportation: Private Auto Accompanied By: daughter Schedule Follow-up Appointment: Yes Clinical Summary of Care: Electronic Signature(s) Signed: 01/07/2020 1:30:06 PM By: Army Melia Entered By: Army Melia on 01/07/2020 11:07:59 Michael Chang ([id number]) -------------------------------------------------------------------------------- Lower Extremity Assessment Details Patient Name: Michael Chang Date of Service: 01/07/2020 9:45 AM Medical Record Number: [id number] Patient Account Number: [id number] Date of Birth/Sex: 10-07-1945 (73 y.o. M) Treating RN: Montey Hora Primary Care Jessee Mezera: Alma Friendly Other Clinician: Referring Dondrell Loudermilk: Alma Friendly Treating Gemini Beaumier/Extender: Melburn Hake, HOYT Weeks in Treatment: 0 Edema Assessment Assessed: [Left: No] [Right: No] Edema: [Left: Yes] [Right: Yes] Calf Left: Right: Point of Measurement: 36 cm From Medial Instep 29 cm 31 cm Ankle Left: Right: Point of Measurement: 12 cm From Medial Instep 20.5 cm 22.5 cm Vascular Assessment Pulses: Dorsalis Pedis Palpable: [Left:Yes] [Right:Yes] Posterior Tibial Palpable: [Left:No] [Right:No] Notes ABIs scheduled for this afternoon Electronic Signature(s) Signed: 01/07/2020 4:13:45 PM By: Montey Hora Entered By: Montey Hora on 01/07/2020 10:21:36 Michael Chang ([id number]) -------------------------------------------------------------------------------- Multi Wound Chart Details Patient Name: Michael Chang Date of Service: 01/07/2020 9:45 AM Medical Record Number: [id number] Patient Account Number: [id number] Date of Birth/Sex: May 20, 1946 (74 y.o. M) Treating RN: Army Melia Primary Care Adra Shepler: Alma Friendly Other Clinician: Referring Kelby Lotspeich: Alma Friendly Treating Lorelee Mclaurin/Extender: Melburn Hake, HOYT Weeks in Treatment: 0 Vital Signs Height(in):  71 Pulse(bpm): 56 Weight(lbs): 138 Blood Pressure(mmHg): 208/110 Body Mass Index(BMI): 19 Temperature(F): 98.6 Respiratory Rate(breaths/min): 16 Photos: Wound Location: Left Foot Right, Medial Ankle Right, Lateral Malleolus Wounding Event: Blister Gradually Appeared Gradually Appeared Primary Etiology: Venous Leg Ulcer Venous Leg Ulcer Venous Leg Ulcer Comorbid History: Hypertension, Myocardial Infarction, Hypertension, Myocardial Infarction, Hypertension, Myocardial Infarction, Peripheral Venous Disease, End Peripheral Venous Disease, End Peripheral Venous Disease, End Stage Renal Disease Stage Renal Disease Stage Renal Disease Date Acquired: 01/07/2019 01/07/2019 01/07/2019 Weeks of Treatment: 0 0 0 Wound Status: Open Open Open Measurements L x W x D (cm) 1.4x1x0.1 8x5.2x0.4 1.2x0.8x0.1 Area (cm) : 1.1 32.673 0.754 Volume (cm) : 0.11 13.069 0.075 Classification: Full Thickness Without Exposed  Full Thickness Without Exposed Full Thickness Without Exposed Support Structures Support Structures Support Structures Exudate Amount: Medium Medium Medium Exudate Type: Serous Serous Serous Exudate Color: amber amber amber Wound Margin: Indistinct, nonvisible Thickened Indistinct, nonvisible Granulation Amount: None Present (0%) Large (67-100%) Medium (34-66%) Granulation Quality: N/A Pink Pink Necrotic Amount: Large (67-100%) Small (1-33%) Medium (34-66%) Exposed Structures: Fat Layer (Subcutaneous Tissue) Fat Layer (Subcutaneous Tissue) Fat Layer (Subcutaneous Tissue) Exposed: Yes Exposed: Yes Exposed: Yes Fascia: No Fascia: No Fascia: No Tendon: No Tendon: No Tendon: No Muscle: No Muscle: No Muscle: No Joint: No Joint: No Joint: No Bone: No Bone: No Bone: No Epithelialization: None None None Treatment Notes Electronic Signature(s) Signed: 01/07/2020 1:30:06 PM By: Army Melia Entered By: Army Melia on 01/07/2020 10:58:58 JHONATAN, LOMELI ([id number]) LAVANTE, TOSO  ([id number]) -------------------------------------------------------------------------------- Multi-Disciplinary Care Plan Details Patient Name: Michael Chang Date of Service: 01/07/2020 9:45 AM Medical Record Number: [id number] Patient Account Number: [id number] Date of Birth/Sex: 03/03/1946 (73 y.o. M) Treating RN: Army Melia Primary Care Hartlee Amedee: Alma Friendly Other Clinician: Referring Owenn Rothermel: Alma Friendly Treating Kadedra Vanaken/Extender: Melburn Hake, HOYT Weeks in Treatment: 0 Active Inactive Orientation to the Wound Care Program Nursing Diagnoses: Knowledge deficit related to the wound healing center program Goals: Patient/caregiver will verbalize understanding of the [place] Program Date Initiated: 01/08/2020 Target Resolution Date: 02/05/2020 Goal Status: Active Interventions: Provide education on orientation to the wound center Notes: Venous Leg Ulcer Nursing Diagnoses: Potential for venous Insuffiency (use before diagnosis confirmed) Goals: Patient/caregiver will verbalize understanding of disease process and disease management Date Initiated: 01/08/2020 Target Resolution Date: 02/05/2020 Goal Status: Active Interventions: Assess peripheral edema status every visit. Notes: Wound/Skin Impairment Nursing Diagnoses: Impaired tissue integrity Goals: Ulcer/skin breakdown will have a volume reduction of 30% by week 4 Date Initiated: 01/08/2020 Target Resolution Date: 02/05/2020 Goal Status: Active Interventions: Assess ulceration(s) every visit Notes: Electronic Signature(s) Signed: 01/08/2020 10:28:03 AM By: Army Melia Previous Signature: 01/07/2020 1:30:06 PM Version By: Army Melia Entered By: Army Melia on 01/08/2020 10:28:03 CARLAS, VANDYNE ([id number]) ELHADJ, GIRTON ([id number]) -------------------------------------------------------------------------------- Pain Assessment Details Patient Name: Michael Chang Date of Service: 01/07/2020 9:45  AM Medical Record Number: [id number] Patient Account Number: [id number] Date of Birth/Sex: [birth year] (74 y.o. M) Treating RN: Montey Hora Primary Care Caroly Purewal: Alma Friendly Other Clinician: Referring Oseph Imburgia: Alma Friendly Treating Hakeen Shipes/Extender: Melburn Hake, HOYT Weeks in Treatment: 0 Active Problems Location of Pain Severity and Description of Pain Patient Has Paino No Site Locations Pain Management and Medication Current Pain Management: Electronic Signature(s) Signed: 01/07/2020 4:13:45 PM By: Montey Hora Entered By: Montey Hora on 01/07/2020 09:58:47 Michael Chang ([id number]) -------------------------------------------------------------------------------- Patient/Caregiver Education Details Patient Name: Michael Chang Date of Service: 01/07/2020 9:45 AM Medical Record Number: [id number] Patient Account Number: [id number] Date of Birth/Gender: 09-05-1946 (73 y.o. M) Treating RN: Army Melia Primary Care Physician: Alma Friendly Other Clinician: Referring Physician: Alma Friendly Treating Physician/Extender: Sharalyn Ink in Treatment: 0 Education Assessment Education Provided To: Patient Education Topics Provided Wound/Skin Impairment: Handouts: Caring for Your Ulcer Methods: Demonstration, Explain/Verbal Responses: State content correctly Electronic Signature(s) Signed: 01/07/2020 1:30:06 PM By: Army Melia Entered By: Army Melia on 01/07/2020 11:07:21 Michael Chang ([id number]) -------------------------------------------------------------------------------- Wound Assessment Details Patient Name: Michael Chang Date of Service: 01/07/2020 9:45 AM Medical Record Number: [id number] Patient Account Number: [id number] Date of Birth/Sex: 09/04/1946 (73 y.o. M) Treating RN: Montey Hora Primary Care Krithi Bray: Alma Friendly Other Clinician: Referring Bienvenido Proehl: Alma Friendly Treating Isabellamarie Randa/Extender: Melburn Hake, HOYT Weeks in  Treatment: 0 Wound Status Wound Number: 1  Primary Venous Leg Ulcer Etiology: Wound Location: Left Foot Wound Open Wounding Event: Blister Status: Date Acquired: 01/07/2019 Comorbid Hypertension, Myocardial Infarction, Peripheral Venous Weeks Of Treatment: 0 History: Disease, End Stage Renal Disease Clustered Wound: No Photos Wound Measurements Length: (cm) 1.4 Width: (cm) 1 Depth: (cm) 0.1 Area: (cm) 1.1 Volume: (cm) 0.11 % Reduction in Area: % Reduction in Volume: Epithelialization: None Tunneling: No Undermining: No Wound Description Classification: Full Thickness Without Exposed Support Structu Wound Margin: Indistinct, nonvisible Exudate Amount: Medium Exudate Type: Serous Exudate Color: amber res Foul Odor After Cleansing: No Slough/Fibrino Yes Wound Bed Granulation Amount: None Present (0%) Exposed Structure Necrotic Amount: Large (67-100%) Fascia Exposed: No Necrotic Quality: Adherent Slough Fat Layer (Subcutaneous Tissue) Exposed: Yes Tendon Exposed: No Muscle Exposed: No Joint Exposed: No Bone Exposed: No Electronic Signature(s) Signed: 01/07/2020 4:13:45 PM By: Montey Hora Entered By: Montey Hora on 01/07/2020 10:15:30 Michael Chang ([id number]) -------------------------------------------------------------------------------- Wound Assessment Details Patient Name: Michael Chang Date of Service: 01/07/2020 9:45 AM Medical Record Number: [id number] Patient Account Number: [id number] Date of Birth/Sex: [birth year] (73 y.o. M) Treating RN: Montey Hora Primary Care Anasophia Pecor: Alma Friendly Other Clinician: Referring Dashonna Chagnon: Alma Friendly Treating Happy Begeman/Extender: Melburn Hake, HOYT Weeks in Treatment: 0 Wound Status Wound Number: 2 Primary Venous Leg Ulcer Etiology: Wound Location: Right, Medial Ankle Wound Open Wounding Event: Gradually Appeared Status: Date Acquired: 01/07/2019 Comorbid Hypertension, Myocardial Infarction, Peripheral  Venous Weeks Of Treatment: 0 History: Disease, End Stage Renal Disease Clustered Wound: No Photos Wound Measurements Length: (cm) 8 Width: (cm) 5.2 Depth: (cm) 0.4 Area: (cm) 32.673 Volume: (cm) 13.069 % Reduction in Area: % Reduction in Volume: Epithelialization: None Tunneling: No Undermining: No Wound Description Classification: Full Thickness Without Exposed Support Structu Wound Margin: Thickened Exudate Amount: Medium Exudate Type: Serous Exudate Color: amber res Foul Odor After Cleansing: No Slough/Fibrino Yes Wound Bed Granulation Amount: Large (67-100%) Exposed Structure Granulation Quality: Pink Fascia Exposed: No Necrotic Amount: Small (1-33%) Fat Layer (Subcutaneous Tissue) Exposed: Yes Necrotic Quality: Adherent Slough Tendon Exposed: No Muscle Exposed: No Joint Exposed: No Bone Exposed: No Treatment Notes Wound #2 (Right, Medial Ankle) Notes prisma, ABD, conform, tubi F Electronic Signature(s) Signed: 01/07/2020 4:13:45 PM By: Anne Shutter ([id number]) Entered By: Montey Hora on 01/07/2020 10:16:40 Michael Chang ([id number]) -------------------------------------------------------------------------------- Wound Assessment Details Patient Name: Michael Chang Date of Service: 01/07/2020 9:45 AM Medical Record Number: [id number] Patient Account Number: [id number] Date of Birth/Sex: 13-May-1946 (74 y.o. M) Treating RN: Montey Hora Primary Care Lexington Devine: Alma Friendly Other Clinician: Referring Bluford Sedler: Alma Friendly Treating Makenzi Bannister/Extender: Melburn Hake, HOYT Weeks in Treatment: 0 Wound Status Wound Number: 3 Primary Venous Leg Ulcer Etiology: Wound Location: Right, Lateral Malleolus Wound Open Wounding Event: Gradually Appeared Status: Date Acquired: 01/07/2019 Comorbid Hypertension, Myocardial Infarction, Peripheral Venous Weeks Of Treatment: 0 History: Disease, End Stage Renal Disease Clustered Wound:  No Photos Wound Measurements Length: (cm) 1.2 Width: (cm) 0.8 Depth: (cm) 0.1 Area: (cm) 0.754 Volume: (cm) 0.075 % Reduction in Area: % Reduction in Volume: Epithelialization: None Tunneling: No Undermining: No Wound Description Classification: Full Thickness Without Exposed Support Structu Wound Margin: Indistinct, nonvisible Exudate Amount: Medium Exudate Type: Serous Exudate Color: amber res Foul Odor After Cleansing: No Slough/Fibrino Yes Wound Bed Granulation Amount: Medium (34-66%) Exposed Structure Granulation Quality: Pink Fascia Exposed: No Necrotic Amount: Medium (34-66%) Fat Layer (Subcutaneous Tissue) Exposed: Yes Necrotic Quality: Adherent Slough Tendon Exposed: No Muscle Exposed: No Joint Exposed: No Bone Exposed: No Treatment Notes Wound #3 (Right, Lateral Malleolus) Notes prisma, ABD,  conform, tubi F Electronic Signature(s) Signed: 01/07/2020 4:13:45 PM By: Anne Shutter ([id number]) Entered By: Montey Hora on 01/07/2020 10:17:54 RAMADAN, COUEY ([id number]) -------------------------------------------------------------------------------- Vitals Details Patient Name: Michael Chang Date of Service: 01/07/2020 9:45 AM Medical Record Number: [id number] Patient Account Number: [id number] Date of Birth/Sex: 1946-04-16 (74 y.o. M) Treating RN: Montey Hora Primary Care Cherylynn Liszewski: Alma Friendly Other Clinician: Referring Gabriela Giannelli: Alma Friendly Treating Kymere Fullington/Extender: Melburn Hake, HOYT Weeks in Treatment: 0 Vital Signs Time Taken: 09:58 Temperature (F): 98.6 Height (in): 71 Pulse (bpm): 71 Source: Measured Respiratory Rate (breaths/min): 16 Weight (lbs): 138 Blood Pressure (mmHg): 208/110 Source: Measured Reference Range: 80 - 120 mg / dl Body Mass Index (BMI): 19.2 Notes manual BP 210/102 - [place] notified Electronic Signature(s) Signed: 01/07/2020 4:13:45 PM By: Montey Hora Entered By: Montey Hora on 01/07/2020  10:11:59

## 2020-01-08 NOTE — Progress Notes (Signed)
JADAKISS, BARISH ([id number]) Visit Report for 01/07/2020 Chief Complaint Document Details Patient Name: Michael Chang, Michael Chang Date of Service: 01/07/2020 9:45 AM Medical Record Number: [id number] Patient Account Number: [id number] Date of Birth/Sex: [birth year] Chang (74 y.o. M) Treating RN: Army Melia Primary Care Provider: Alma Friendly Other Clinician: Referring Provider: Alma Friendly Treating Provider/Extender: Melburn Hake, Dyon Rotert Weeks in Treatment: 0 Information Obtained from: Patient Chief Complaint Right foot and ankle ulcers Electronic Signature(s) Signed: 01/07/2020 10:29:37 AM By: Worthy Keeler PA-C Entered By: Worthy Keeler on 01/07/2020 10:29:36 Michael Chang ([id number]) -------------------------------------------------------------------------------- Debridement Details Patient Name: Michael Chang Date of Service: 01/07/2020 9:45 AM Medical Record Number: [id number] Patient Account Number: [id number] Date of Birth/Sex: Michael [birth year] (73 y.o. M) Treating RN: Army Melia Primary Care Provider: Alma Friendly Other Clinician: Referring Provider: Alma Friendly Treating Provider/Extender: Melburn Hake, Gordon Vandunk Weeks in Treatment: 0 Debridement Performed for Wound #1 Left,Lateral Foot Assessment: Performed By: Physician STONE III, Jessenia Filippone E., PA-C Debridement Type: Chemical/Enzymatic/Mechanical Agent Used: Gauze and saline Severity of Tissue Pre Debridement: Fat layer exposed Level of Consciousness (Pre- Awake and Alert procedure): Pre-procedure Verification/Time Out Yes - 11:05 Taken: Start Time: 11:06 Pain Control: Lidocaine Instrument: Other : gauze and saline Bleeding: None End Time: 11:08 Response to Treatment: Procedure was tolerated well Level of Consciousness (Post- Awake and Alert procedure): Post Debridement Measurements of Total Wound Length: (cm) 1.4 Width: (cm) 1 Depth: (cm) 0.1 Volume: (cm) 0.11 Character of Wound/Ulcer Post Debridement: Stable Severity of  Tissue Post Debridement: Fat layer exposed Post Procedure Diagnosis Same as Pre-procedure Electronic Signature(s) Signed: 01/08/2020 10:25:39 AM By: Army Melia Signed: 01/08/2020 10:Chang:16 AM By: Worthy Keeler PA-C Previous Signature: 01/07/2020 1:30:06 PM Version By: Army Melia Entered By: Army Melia on 01/08/2020 10:25:38 Michael Chang ([id number]) -------------------------------------------------------------------------------- Debridement Details Patient Name: Michael Chang Date of Service: 01/07/2020 9:45 AM Medical Record Number: [id number] Patient Account Number: [id number] Date of Birth/Sex: Michael [birth year] (73 y.o. M) Treating RN: Army Melia Primary Care Provider: Alma Friendly Other Clinician: Referring Provider: Alma Friendly Treating Provider/Extender: Melburn Hake, [name] Weeks in Treatment: 0 Debridement Performed for Wound #2 Right,Medial Ankle Assessment: Performed By: Physician STONE III, Md Smola E., PA-C Debridement Type: Chemical/Enzymatic/Mechanical Agent Used: Gauze and saline Severity of Tissue Pre Debridement: Fat layer exposed Level of Consciousness (Pre- Awake and Alert procedure): Pre-procedure Verification/Time Out Yes - 11:05 Taken: Start Time: 11:06 Pain Control: Lidocaine Instrument: Other : gauze and saline Bleeding: None End Time: 11:08 Response to Treatment: Procedure was tolerated well Level of Consciousness (Post- Awake and Alert procedure): Post Debridement Measurements of Total Wound Length: (cm) 8 Width: (cm) 5.2 Depth: (cm) 0.4 Volume: (cm) 13.069 Character of Wound/Ulcer Post Debridement: Stable Severity of Tissue Post Debridement: Fat layer exposed Post Procedure Diagnosis Same as Pre-procedure Electronic Signature(s) Signed: 01/08/2020 10:25:47 AM By: Army Melia Signed: 01/08/2020 10:Chang:16 AM By: Worthy Keeler PA-C Previous Signature: 01/07/2020 1:30:06 PM Version By: Army Melia Entered By: Army Melia on 01/08/2020  10:25:47 Michael Chang ([id number]) -------------------------------------------------------------------------------- Debridement Details Patient Name: Michael Chang Date of Service: 01/07/2020 9:45 AM Medical Record Number: [id number] Patient Account Number: [id number] Date of Birth/Sex: Michael Chang, Michael Chang (73 y.o. M) Treating RN: Army Melia Primary Care Provider: Alma Friendly Other Clinician: Referring Provider: Alma Friendly Treating Provider/Extender: Melburn Hake, Almeter Westhoff Weeks in Treatment: 0 Debridement Performed for Wound #3 Right,Lateral Malleolus Assessment: Performed By: Physician STONE III, Khloei Spiker E., PA-C Debridement Type: Chemical/Enzymatic/Mechanical Agent Used: Gauze and saline Severity of Tissue Pre Debridement: Fat layer exposed Level of Consciousness (Pre- Awake and Alert procedure): Pre-procedure  Verification/Time Out Yes - 11:05 Taken: Start Time: 11:06 Pain Control: Lidocaine Instrument: Other : gauze and saline Bleeding: None End Time: 11:08 Response to Treatment: Procedure was tolerated well Level of Consciousness (Post- Awake and Alert procedure): Post Debridement Measurements of Total Wound Length: (cm) 1.2 Width: (cm) 0.8 Depth: (cm) 0.1 Volume: (cm) 0.075 Character of Wound/Ulcer Post Debridement: Stable Severity of Tissue Post Debridement: Fat layer exposed Post Procedure Diagnosis Same as Pre-procedure Electronic Signature(s) Signed: 01/08/2020 10:25:54 AM By: Army Melia Signed: 01/08/2020 10:Chang:16 AM By: Worthy Keeler PA-C Previous Signature: 01/07/2020 1:30:06 PM Version By: Army Melia Entered By: Army Melia on 01/08/2020 10:25:54 Michael Chang ([id number]) -------------------------------------------------------------------------------- HPI Details Patient Name: Michael Chang Date of Service: 01/07/2020 9:45 AM Medical Record Number: [id number] Patient Account Number: [id number] Date of Birth/Sex: Michael [birth year], Michael Chang (73 y.o. M) Treating RN:  Army Melia Primary Care Provider: Alma Friendly Other Clinician: Referring Provider: Alma Friendly Treating Provider/Extender: Melburn Hake, Zachrey Deutscher Weeks in Treatment: 0 History of Present Illness HPI Description: 01/07/2020 patient presents today for initial evaluation in our clinic concerning issues that he has been having with ulcers on his bilateral feet. More specifically at his left foot and his right ankle and on the ankle it is medial and lateral. With that being said these wounds have apparently been present for a significant amount of time in fact the patient states "20 years". When I questioned this and even ask his family member that was with him today she states that the last time she saw them was around 2006 and they were not this bad. Nonetheless I think he has had this for a significant amount of time although I am unsure of the exact locations of wounds and if some have come and gone. He does have chronic venous insufficiency, lymphedema, hypertension, and has had a heart attack in the recent past few months. Subsequently he just moved from [place] to this area to live with family. His hypertension has been very uncontrolled he does see Dr. [name] and was placed on amlodipine 5 mg. With that being said unfortunately this does not seem according to family to have helped much with his blood pressure currently though to be honest it somewhat hard to know for sure as he had not taken the medicine before he came in today his blood pressure was significantly elevated but apparently it was when he saw his primary on Tuesday as well but then by the time he took his medicine got to the cardiologist this was much lower dropping from around 528 systolic to around 413. Nonetheless my suggestion in this regard is can be for her to keep a track of his blood pressure readings morning noon and night in order to see how this goes throughout the day for the next week and then we will address it  following. In regard to the wounds we are going to see what we can do as far as helping with this currently. He also goes to vascular for testing later today. Electronic Signature(s) Signed: 01/07/2020 6:12:59 PM By: Worthy Keeler PA-C Entered By: Worthy Keeler on 01/07/2020 18:12:59 Michael Chang, Michael Chang ([id number]) -------------------------------------------------------------------------------- Physical Exam Details Patient Name: Michael Chang Date of Service: 01/07/2020 9:45 AM Medical Record Number: [id number] Patient Account Number: [id number] Date of Birth/Sex: [birth year] (73 y.o. M) Treating RN: Army Melia Primary Care Provider: Alma Friendly Other Clinician: Referring Provider: Alma Friendly Treating Provider/Extender: Melburn Hake, Lundyn Coste Weeks in Treatment: 0 Constitutional patient is hypertensive.. pulse regular and within target  range for patient.[name] respirations regular, non-labored and within target range for patient.[name] temperature within target range for patient.. Well-nourished and well-hydrated in no acute distress. Eyes conjunctiva clear no eyelid edema noted. pupils equal round and reactive to light and accommodation. Ears, Nose, Mouth, and Throat no gross abnormality of ear auricles or external auditory canals. normal hearing noted during conversation. mucus membranes moist. Respiratory normal breathing without difficulty. Cardiovascular 2+ dorsalis pedis/posterior tibialis pulses. 1+ pitting edema of the bilateral lower extremities. Musculoskeletal normal gait and posture. no significant deformity or arthritic changes, no loss or range of motion, no clubbing. Psychiatric this patient is able to make decisions and demonstrates good insight into disease process. Alert and Oriented x 3. pleasant and cooperative. Notes Patient's wounds currently actually appear to be doing quite well without any signs of active infection at this time which is great news. He has a lot  of new epithelial growth which is excellent news. With that being said I think that the hospital did an excellent job taking care of him and now upon discharge they have been doing a good job at home as well. He does have minimal swelling nothing too significant at this time. All in all I feel like the wounds are headed in the right direction but again it sounds like he has had these for significant amount of time. Electronic Signature(s) Signed: 01/07/2020 6:13:36 PM By: Worthy Keeler PA-C Entered By: Worthy Keeler on 01/07/2020 18:13:36 Michael Chang ([id number]) -------------------------------------------------------------------------------- Physician Orders Details Patient Name: Michael Chang Date of Service: 01/07/2020 9:45 AM Medical Record Number: [id number] Patient Account Number: [id number] Date of Birth/Sex: [birth year] (73 y.o. 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 o Cleanse wound with mild soap and water Wound #2 Right,Medial Ankle o Clean wound with Normal Saline. o Cleanse wound with mild soap and water Wound #3 Right,Lateral Malleolus o Clean wound with Normal Saline. o Cleanse wound with mild soap and water Primary Wound Dressing Wound #1 Left,Lateral Foot o Silver Collagen - moisten  with saline Wound #2 Right,Medial Ankle o Silver Collagen - moisten with saline Wound #3 Right,Lateral Malleolus o Silver Collagen - moisten with saline Secondary Dressing Wound #1 Left,Lateral Foot o ABD and Kerlix/Conform Wound #2 Right,Medial Ankle o ABD and Kerlix/Conform Wound #3 Right,Lateral Malleolus o ABD and Kerlix/Conform Dressing Change Frequency Wound #1 Left,Lateral Foot o Change dressing every other day. Wound #2 Right,Medial Ankle o Change dressing every other day. Wound #3 Right,Lateral Malleolus o Change dressing every other day. ALIJAH, AKRAM ([id number]) Follow-up Appointments Wound #1 Left,Lateral Foot o Return Appointment in 1 week. Wound #2 Right,Medial Ankle o Return Appointment in 1 week. Wound #3 Right,Lateral Malleolus o Return Appointment in 1 week. Edema Control Wound #1 Left,Lateral Foot o Other: - tubi grip F Wound #2 Right,Medial Ankle o Other: - tubi grip F Wound #3 Right,Lateral Malleolus o Other: - tubi grip F Electronic Signature(s) Signed: 01/07/2020 1:30:06 PM By: Army Melia Signed: 01/08/2020 10:Chang:16  AM By: Worthy Keeler PA-C Entered By: Army Melia on 01/07/2020 11:06:33 Michael Chang ([id number]) -------------------------------------------------------------------------------- Problem List Details Patient Name: Michael Chang Date of Service: 01/07/2020 9:45 AM Medical Record Number: [id number] Patient Account Number: [id number] Date of Birth/Sex: [birth year] (73 y.o. M) Treating RN: Army Melia Primary Care Provider: Alma Friendly Other Clinician: Referring Provider: Alma Friendly Treating Provider/Extender: Melburn Hake, Hurshell Dino Weeks in Treatment: 0 Active Problems ICD-10 Evaluated Encounter Code Description Active Date Today Diagnosis I87.2 Venous insufficiency (chronic) (peripheral) 01/07/2020 No Yes I89.0 Lymphedema, not elsewhere classified 01/07/2020 No Yes L97.522 Non-pressure chronic  ulcer of other part of left foot with fat layer exposed 01/07/2020 No Yes L97.312 Non-pressure chronic ulcer of right ankle with fat layer exposed 01/07/2020 No Yes I10 Essential (primary) hypertension 01/07/2020 No Yes I25.10 Atherosclerotic heart disease of native coronary artery without angina 01/07/2020 No Yes pectoris Inactive Problems Resolved Problems Electronic Signature(s) Signed: 01/07/2020 10:29:22 AM By: Worthy Keeler PA-C Entered By: Worthy Keeler on 01/07/2020 10:29:21 Michael Chang ([id number]) -------------------------------------------------------------------------------- Progress Note Details Patient Name: Michael Chang Date of Service: 01/07/2020 9:45 AM Medical Record Number: [id number] Patient Account Number: [id number] Date of Birth/Sex: [birth year] Michael Chang (73 y.o. M) Treating RN: Army Melia Primary Care Provider: Alma Friendly Other Clinician: Referring Provider: Alma Friendly Treating Provider/Extender: Melburn Hake, Donovin Kraemer Weeks in Treatment: 0 Subjective Chief Complaint Information obtained from Patient Right foot and ankle ulcers History of Present Illness (HPI) 01/07/2020 patient presents today for initial evaluation in our clinic concerning issues that he has been having with ulcers on his bilateral feet. More specifically at his left foot and his right ankle and on the ankle it is medial and lateral. With that being said these wounds have apparently been present for a significant amount of time in fact the patient states "20 years". When I questioned this and even ask his family member that was with him today she states that the last time she saw them was around 2006 and they were not this bad. Nonetheless I think he has had this for a significant amount of time although I am unsure of the exact locations of wounds and if some have come and gone. He does have chronic venous insufficiency, lymphedema, hypertension, and has had a heart attack in the recent past few  months. Subsequently he just moved from [place] to this area to live with family. His hypertension has been very uncontrolled he does see Dr. [name] and was placed on amlodipine 5 mg. With that being said unfortunately this does not seem according to family to have helped much with his blood pressure currently though to be honest it somewhat hard to know for sure as he had not taken the medicine before he came in today his blood pressure was significantly elevated but apparently it was when he saw his primary on Tuesday as well but then by the time he took his medicine got to the cardiologist this was much lower dropping from around 364 systolic to around 680. Nonetheless my suggestion in this regard is can be for her to keep a track of his blood pressure readings morning noon and night in order to see how this goes throughout the day for the next week and then we will address it following. In regard to the wounds we are going to see what we can do as far as helping with this currently. He also goes to vascular for testing later today. Patient History Information obtained from Patient. Allergies furosemide (Reaction: diarrhea) Family  History Diabetes - Mother, No family history of Cancer, Heart Disease, Hereditary Spherocytosis, Hypertension, Kidney Disease, Lung Disease, Seizures, Stroke, Thyroid Problems, Tuberculosis. Social History Never smoker, Marital Status - Single, Alcohol Use - Never, Drug Use - No History, Caffeine Use - Never. Medical History Cardiovascular Patient has history of Hypertension, Myocardial Infarction - 11/2019, Peripheral Venous Disease Denies history of Angina, Arrhythmia, Congestive Heart Failure, Coronary Artery Disease, Deep Vein Thrombosis, Hypotension, Peripheral Arterial Disease, Phlebitis, Vasculitis Genitourinary Patient has history of End Stage Renal Disease - CKD Integumentary (Skin) Denies history of History of Burn, History of pressure wounds Review  of Systems (ROS) Constitutional Symptoms (General Health) Denies complaints or symptoms of Fatigue, Fever, Chills, Marked Weight Change. Eyes Denies complaints or symptoms of Dry Eyes, Vision Changes, Glasses / Contacts. Ear/Nose/Mouth/Throat Denies complaints or symptoms of Difficult clearing ears, Sinusitis. Hematologic/Lymphatic Denies complaints or symptoms of Bleeding / Clotting Disorders, Human Immunodeficiency Virus. Respiratory Denies complaints or symptoms of Chronic or frequent coughs, Shortness of Breath. Cardiovascular Denies complaints or symptoms of Chest pain, LE edema. Michael Chang, Michael Chang ([id number]) Gastrointestinal Denies complaints or symptoms of Frequent diarrhea, Nausea, Vomiting. Endocrine Denies complaints or symptoms of Hepatitis, Thyroid disease, Polydypsia (Excessive Thirst). Genitourinary Complains or has symptoms of Kidney failure/ Dialysis - CKD. Denies complaints or symptoms of Incontinence/dribbling. Immunological Denies complaints or symptoms of Hives, Itching. Integumentary (Skin) Complains or has symptoms of Wounds. Denies complaints or symptoms of Bleeding or bruising tendency, Breakdown, Swelling. Musculoskeletal Denies complaints or symptoms of Muscle Pain, Muscle Weakness. Neurologic Denies complaints or symptoms of Numbness/parasthesias, Focal/Weakness. Psychiatric Denies complaints or symptoms of Anxiety, Claustrophobia. Objective Constitutional patient is hypertensive.. pulse regular and within target range for patient.[name] respirations regular, non-labored and within target range for patient.[name] temperature within target range for patient.. Well-nourished and well-hydrated in no acute distress. Vitals Time Taken: 9:58 AM, Height: 71 in, Source: Measured, Weight: 138 lbs, Source: Measured, BMI: 19.2, Temperature: 98.6 F, Pulse: 71 bpm, Respiratory Rate: 16 breaths/min, Blood Pressure: 208/110 mmHg. General Notes: manual BP 210/102 - Aruna Nestler  notified Eyes conjunctiva clear no eyelid edema noted. pupils equal round and reactive to light and accommodation. Ears, Nose, Mouth, and Throat no gross abnormality of ear auricles or external auditory canals. normal hearing noted during conversation. mucus membranes moist. Respiratory normal breathing without difficulty. Cardiovascular 2+ dorsalis pedis/posterior tibialis pulses. 1+ pitting edema of the bilateral lower extremities. Musculoskeletal normal gait and posture. no significant deformity or arthritic changes, no loss or range of motion, no clubbing. Psychiatric this patient is able to make decisions and demonstrates good insight into disease process. Alert and Oriented x 3. pleasant and cooperative. General Notes: Patient's wounds currently actually appear to be doing quite well without any signs of active infection at this time which is great news. He has a lot of new epithelial growth which is excellent news. With that being said I think that the hospital did an excellent job taking care of him and now upon discharge they have been doing a good job at home as well. He does have minimal swelling nothing too significant at this time. All in all I feel like the wounds are headed in the right direction but again it sounds like he has had these for significant amount of time. Integumentary (Hair, Skin) Wound #1 status is Open. Original cause of wound was Blister. The wound is located on the Left,Lateral Foot. The wound measures 1.4cm length x 1cm width x 0.1cm depth; 1.1cm^2 area and 0.11cm^3 volume. There is Fat  Layer (Subcutaneous Tissue) Exposed exposed. There is no tunneling or undermining noted. There is a medium amount of serous drainage noted. The wound margin is indistinct and nonvisible. There is no granulation within the wound bed. There is a large (67-100%) amount of necrotic tissue within the wound bed including Adherent Slough. Wound #2 status is Open. Original cause of  wound was Gradually Appeared. The wound is located on the Right,Medial Ankle. The wound measures 8cm length x 5.2cm width x 0.4cm depth; 32.673cm^2 area and 13.069cm^3 volume. There is Fat Layer (Subcutaneous Tissue) Exposed exposed. There is no tunneling or undermining noted. There is a medium amount of serous drainage noted. The wound margin is thickened. There is large (67- 100%) pink granulation within the wound bed. There is a small (1-33%) amount of necrotic tissue within the wound bed including Adherent Slough. Michael Chang, Michael Chang ([id number]) Wound #3 status is Open. Original cause of wound was Gradually Appeared. The wound is located on the Right,Lateral Malleolus. The wound measures 1.2cm length x 0.8cm width x 0.1cm depth; 0.754cm^2 area and 0.075cm^3 volume. There is Fat Layer (Subcutaneous Tissue) Exposed exposed. There is no tunneling or undermining noted. There is a medium amount of serous drainage noted. The wound margin is indistinct and nonvisible. There is medium (34-66%) pink granulation within the wound bed. There is a medium (34-66%) amount of necrotic tissue within the wound bed including Adherent Slough. 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 There was a Chemical/Enzymatic/Mechanical debridement performed by STONE III, Cami Delawder E., PA-C. With the following instrument(s): gauze and saline after achieving pain control using Lidocaine. Other agent used was Gauze and saline. A time out was  conducted at 11:05, prior to the start of the procedure. There was no bleeding. The procedure was tolerated well. Post Debridement Measurements: 1.4cm length x 1cm width x 0.1cm depth; 0.11cm^3 volume. Character of Wound/Ulcer Post Debridement is stable. Severity of Tissue Post Debridement is: Fat layer exposed. Post procedure Diagnosis Wound #1: Same as Pre-Procedure Wound #2 Pre-procedure diagnosis of Wound #2 is a Venous Leg Ulcer located on the Right,Medial Ankle .Severity of Tissue Pre Debridement is: Fat layer exposed. There was a Chemical/Enzymatic/Mechanical debridement performed by STONE III, Nowell Sites E., PA-C. With the following instrument(s): gauze and saline after achieving pain control using Lidocaine. Other agent used was Gauze and saline. A time out was conducted at 11:05, prior to the start of the procedure. There was no bleeding. The procedure was tolerated well. Post Debridement Measurements: 8cm length x 5.2cm width x 0.4cm depth; 13.069cm^3 volume. Character of Wound/Ulcer Post Debridement is stable. Severity of Tissue Post Debridement is: Fat layer exposed. Post procedure Diagnosis Wound #2: Same as Pre-Procedure Wound #3 Pre-procedure diagnosis of Wound #3 is a Venous Leg Ulcer located on the Right,Lateral Malleolus .Severity of Tissue Pre Debridement is: Fat layer exposed. There was a Chemical/Enzymatic/Mechanical debridement performed by STONE III, [place]ear[place] Evitt E., PA-C. With the following instrument(s): gauze and saline after achieving pain control using Lidocaine. Other agent used was Gauze and saline. A time out was conducted at 11:05, prior to the start of the procedure. There  was no bleeding. The procedure was tolerated well. Post Debridement Measurements: 1.2cm length x 0.8cm width x 0.1cm depth; 0.075cm^3 volume. Character of Wound/Ulcer Post Debridement is stable. Severity of Tissue Post Debridement is: Fat layer exposed. Post procedure Diagnosis Wound #3: Same as  Pre-Procedure Plan Wound Cleansing: Wound #1 Left,Lateral Foot: Clean wound with Normal Saline. Cleanse wound with mild soap and water Michael Chang, Michael Chang ([id number]) Wound #2 Right,Medial Ankle: Clean wound with Normal Saline. Cleanse wound with mild soap and water Wound #3 Right,Lateral Malleolus: Clean wound with Normal Saline. Cleanse wound with mild soap and water Primary Wound Dressing: Wound #1 Left,Lateral Foot: Silver Collagen - moisten with saline Wound #2 Right,Medial Ankle: Silver Collagen - moisten with saline Wound #3 Right,Lateral Malleolus: Silver Collagen - moisten with saline Secondary Dressing: Wound #1 Left,Lateral Foot: ABD and Kerlix/Conform Wound #2 Right,Medial Ankle: ABD and Kerlix/Conform Wound #3 Right,Lateral Malleolus: ABD and Kerlix/Conform Dressing Change Frequency: Wound #1 Left,Lateral Foot: Change dressing every other day. Wound #2 Right,Medial Ankle: Change dressing every other day. Wound #3 Right,Lateral Malleolus: Change dressing every other day. Follow-up Appointments: Wound #1 Left,Lateral Foot: Return Appointment in 1 week. Wound #2 Right,Medial Ankle: Return Appointment in 1 week. Wound #3 Right,Lateral Malleolus: Return Appointment in 1 week. Edema Control: Wound #1 Left,Lateral Foot: Other: - tubi grip F Wound #2 Right,Medial Ankle: Other: - tubi grip F Wound #3 Right,Lateral Malleolus: Other: - tubi grip F 1. My suggestion at this time is good to be that we go ahead and initiate treatment with a silver collagen dressing to all wound locations. I think this will help stimulate epithelial growth. 2. We will get a cover this with an ABD pad and roll gauze at all locations. 3. We will also get a let his family member continue to change the dressing she really does not want to keep the wrap on all week for now since he has been doing well with just using roll gauze I am get a use Tubigrip just to give some compression and we will  see how things do over the next week. He is also to be seen vascular later today so we will put the Tubigrip on yet we will let them do it after the appointment. We will see patient back for reevaluation in 1 week here in the clinic. If anything worsens or changes patient will contact our office for additional recommendations. Electronic Signature(s) Signed: 01/08/2020 10:35:27 AM By: Gretta Cool, BSN, RN, CWS, Kim RN, BSN Signed: 01/08/2020 10:36:10 AM By: Worthy Keeler PA-C Previous Signature: 01/07/2020 6:14:30 PM Version By: Worthy Keeler PA-C Entered By: Gretta Cool BSN, RN, CWS, Kim on 01/08/2020 10:35:26 Michael Chang, Michael Chang ([id number]) -------------------------------------------------------------------------------- ROS/PFSH Details Patient Name: Michael Chang Date of Service: 01/07/2020 9:45 AM Medical Record Number: [id number] Patient Account Number: [id number] Date of Birth/Sex: [birth year] (73 y.o. M) Treating RN: Montey Hora Primary Care Provider: Alma Friendly Other Clinician: Referring Provider: Alma Friendly Treating Provider/Extender: Melburn Hake, Kaiven Vester Weeks in Treatment: 0 Information Obtained From Patient Constitutional Symptoms (General Health) Complaints and Symptoms: Negative for: Fatigue; Fever; Chills; Marked Weight Change Eyes Complaints and Symptoms: Negative for: Dry Eyes; Vision Changes; Glasses / Contacts Ear/Nose/Mouth/Throat Complaints and Symptoms: Negative for: Difficult clearing ears; Sinusitis Hematologic/Lymphatic Complaints and Symptoms: Negative for: Bleeding / Clotting Disorders; Human Immunodeficiency Virus Respiratory Complaints and Symptoms: Negative for: Chronic or frequent coughs; Shortness of Breath Cardiovascular Complaints and Symptoms: Negative for: Chest pain; LE edema Medical History: Positive for: Hypertension; Myocardial Infarction - 11/2019; Peripheral Venous Disease  Negative for: Angina; Arrhythmia; Congestive Heart Failure; Coronary  Artery Disease; Deep Vein Thrombosis; Hypotension; Peripheral Arterial Disease; Phlebitis; Vasculitis Gastrointestinal Complaints and Symptoms: Negative for: Frequent diarrhea; Nausea; Vomiting Endocrine Complaints and Symptoms: Negative for: Hepatitis; Thyroid disease; Polydypsia (Excessive Thirst) Genitourinary Complaints and Symptoms: Positive for: Kidney failure/ Dialysis - CKD Negative for: Incontinence/dribbling Medical History: Positive for: End Stage Renal Disease - CKD Immunological Vesey, Ernest ([id number]) Complaints and Symptoms: Negative for: Hives; Itching Integumentary (Skin) Complaints and Symptoms: Positive for: Wounds Negative for: Bleeding or bruising tendency; Breakdown; Swelling Medical History: Negative for: History of Burn; History of pressure wounds Musculoskeletal Complaints and Symptoms: Negative for: Muscle Pain; Muscle Weakness Neurologic Complaints and Symptoms: Negative for: Numbness/parasthesias; Focal/Weakness Psychiatric Complaints and Symptoms: Negative for: Anxiety; Claustrophobia Oncologic Immunizations Pneumococcal Vaccine: Received Pneumococcal Vaccination: No Implantable Devices None Family and Social History Cancer: No; Diabetes: Yes - Mother; Heart Disease: No; Hereditary Spherocytosis: No; Hypertension: No; Kidney Disease: No; Lung Disease: No; Seizures: No; Stroke: No; Thyroid Problems: No; Tuberculosis: No; Never smoker; Marital Status - Single; Alcohol Use: Never; Drug Use: No History; Caffeine Use: Never; Financial Concerns: No; Food, Clothing or Shelter Needs: No; Support System Lacking: No; Transportation Concerns: No Electronic Signature(s) Signed: 01/07/2020 4:13:45 PM By: Montey Hora Signed: 01/08/2020 10:Chang:16 AM By: Worthy Keeler PA-C Entered By: Montey Hora on 01/07/2020 09:58:29 Michael Chang ([id number]) -------------------------------------------------------------------------------- SuperBill  Details Patient Name: Michael Chang Date of Service: 01/07/2020 Medical Record Number: [id number] Patient Account Number: [id number] Date of Birth/Sex: [birth year] (74 y.o. 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 I10 Essential (primary) hypertension I25.10 Atherosclerotic heart disease of native coronary artery without angina pectoris Facility Procedures CPT4 Code: 45038882 Description: 80034 - WOUND CARE VISIT-LEV 5 EST PT Modifier: Quantity: 1 Physician Procedures CPT4 Code: 9179150 Description: WC PHYS LEVEL 3 o NEW PT Modifier: Quantity: 1 CPT4 Code: Description: ICD-10 Diagnosis Description I87.2 Venous insufficiency (chronic) (peripheral) I89.0 Lymphedema, not elsewhere classified L97.522 Non-pressure chronic ulcer of other part of left foot with fat layer expo L97.312 Non-pressure chronic ulcer of  right ankle with fat layer exposed Modifier: sed Quantity: Electronic Signature(s) Signed: 01/08/2020 10:34:55 AM By: Gretta Cool, BSN, RN, CWS, Kim RN, BSN Signed: 01/08/2020 10:36:10 AM By: Worthy Keeler PA-C Previous Signature: 01/07/2020 6:14:51 PM Version By: Worthy Keeler PA-C Entered By: Gretta Cool, BSN, RN, CWS, Kim on 01/08/2020 10:34:53

## 2020-01-11 ENCOUNTER — Other Ambulatory Visit: Payer: Self-pay | Admitting: Primary Care

## 2020-01-11 DIAGNOSIS — N189 Chronic kidney disease, unspecified: Secondary | ICD-10-CM

## 2020-01-11 NOTE — Telephone Encounter (Signed)
Addressed in result note.  

## 2020-01-14 ENCOUNTER — Encounter: Payer: Medicare Other | Admitting: Physician Assistant

## 2020-01-14 ENCOUNTER — Telehealth: Payer: Self-pay | Admitting: Cardiovascular Disease

## 2020-01-14 ENCOUNTER — Telehealth: Payer: Self-pay | Admitting: Primary Care

## 2020-01-14 ENCOUNTER — Other Ambulatory Visit: Payer: Self-pay

## 2020-01-14 DIAGNOSIS — L97522 Non-pressure chronic ulcer of other part of left foot with fat layer exposed: Secondary | ICD-10-CM | POA: Diagnosis not present

## 2020-01-14 NOTE — Telephone Encounter (Signed)
Patient's sister Mayer Vondrak called and left a voicemail regarding getting a FL2 form filled out for Nursing home placement.   Please advise, thanks.

## 2020-01-14 NOTE — Progress Notes (Addendum)
DEMONTRE, PADIN ([id number]) Visit Report for 01/14/2020 Chief Complaint Document Details Patient Name: Michael Chang, Michael Chang Date of Service: 01/14/2020 12:45 PM Medical Record Number: [id number] Patient Account Number: [id number] Date of Birth/Sex: 14-Sep-1946 (74 y.o. M) Treating RN: Army Melia Primary Care Provider: Alma Friendly Other Clinician: Referring Provider: Alma Friendly Treating Provider/Extender: Melburn Hake, Wylene Weissman Weeks in Treatment: 1 Information Obtained from: Patient Chief Complaint Right foot and ankle ulcers Electronic Signature(s) Signed: 01/14/2020 1:18:12 PM By: Worthy Keeler PA-C Entered By: Worthy Keeler on 01/14/2020 13:18:11 Michael Chang ([id number]) -------------------------------------------------------------------------------- HPI Details Patient Name: Michael Chang Date of Service: 01/14/2020 12:45 PM Medical Record Number: [id number] Patient Account Number: [id number] Date of Birth/Sex: [birth year] (74 y.o. M) Treating RN: Army Melia Primary Care Provider: Alma Friendly Other Clinician: Referring Provider: Alma Friendly Treating Provider/Extender: Melburn Hake, Socorro Kanitz Weeks in Treatment: 1 History of Present Illness HPI Description: 01/07/2020 patient presents today for initial evaluation in our clinic concerning issues that he has been having with ulcers on his bilateral feet. More specifically at his left foot and his right ankle and on the ankle it is medial and lateral. With that being said these wounds have apparently been present for a significant amount of time in fact the patient states "20 years". When I questioned this and even ask his family member that was with him today she states that the last time she saw them was around 2006 and they were not this bad. Nonetheless I think he has had this for a significant amount of time although I am unsure of the exact locations of wounds and if some have come and gone. He does have chronic  venous insufficiency, lymphedema, hypertension, and has had a heart attack in the recent past few months. Subsequently he just moved from [place] to this area to live with family. His hypertension has been very uncontrolled he does see Dr. [name] and was placed on amlodipine 5 mg. With that being said unfortunately this does not seem according to family to have helped much with his blood pressure currently though to be honest it somewhat hard to know for sure as he had not taken the medicine before he came in today his blood pressure was significantly elevated but apparently it was when he saw his primary on Tuesday as well but then by the time he took his medicine got to the cardiologist this was much lower dropping from around 814 systolic to around 481. Nonetheless my suggestion in this regard is can be for her to keep a track of his blood pressure readings morning noon and night in order to see how this goes throughout the day for the next week and then we will address it following. In regard to the wounds we are going to see what we can do as far as helping with this currently. He also goes to vascular for testing later today. 01/14/2020 upon evaluation today patient's wound actually appears to be doing quite well in general at all locations. He has a lot of new epithelial growth which is great news. Fortunately there is no signs of active infection at this time. He did have his arterial study on the right this was a ABI of 1.44 with a TBI of 1.10 on the left an ABI of 1.46 with a TBI of 0.93. The good news is he has no signs of arterial insufficiency. The patient also did have blood pressure readings that his family member took over the past week morning and evening. Consistently  he was much lower in the evening than he was in the morning. They feel like that his amlodipine probably needs to be increased again I explained that I would give there cardiologist, Dr. [name], a call but that that is not  something that I would manage here at the wound care center as far as his blood pressure is concerned. Be more than happy to relay that information to Dr. Tyrell Antonio office however. Electronic Signature(s) Signed: 01/14/2020 5:16:11 PM By: Worthy Keeler PA-C Entered By: Worthy Keeler on 01/14/2020 17:16:11 Michael Chang, Michael Chang ([id number]) -------------------------------------------------------------------------------- Physical Exam Details Patient Name: Michael Chang Date of Service: 01/14/2020 12:45 PM Medical Record Number: [id number] Patient Account Number: [id number] Date of Birth/Sex: 1946-04-22 (74 y.o. M) Treating RN: Army Melia Primary Care Provider: Alma Friendly Other Clinician: Referring Provider: Alma Friendly Treating Provider/Extender: Melburn Hake, Judee Hennick Weeks in Treatment: 1 Constitutional Well-nourished and well-hydrated in no acute distress. Respiratory normal breathing without difficulty. Psychiatric this patient is able to make decisions and demonstrates good insight into disease process. Alert and Oriented x 3. pleasant and cooperative. Notes Upon inspection patient's wounds again showed signs of excellent epithelization I am extremely pleased with how things seem to be progressing and overall I think the patient is doing quite nicely. Electronic Signature(s) Signed: 01/14/2020 5:16:26 PM By: Worthy Keeler PA-C Entered By: Worthy Keeler on 01/14/2020 17:16:26 Michael Chang ([id number]) -------------------------------------------------------------------------------- Physician Orders Details Patient Name: Michael Chang Date of Service: 01/14/2020 12:45 PM Medical Record Number: [id number] Patient Account Number: [id number] Date of Birth/Sex: 1946-08-28 (74 y.o. M) Treating RN: Army Melia Primary Care Provider: Alma Friendly Other Clinician: Referring Provider: Alma Friendly Treating Provider/Extender: Melburn Hake, Rushie Brazel Weeks in Treatment: 1 Verbal /  Phone Orders: No Diagnosis Coding ICD-10 Coding Code Description I87.2 Venous insufficiency (chronic) (peripheral) I89.0 Lymphedema, not elsewhere classified L97.522 Non-pressure chronic ulcer of other part of left foot with fat layer exposed L97.312 Non-pressure chronic ulcer of right ankle with fat layer exposed I10 Essential (primary) hypertension I25.10 Atherosclerotic heart disease of native coronary 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 o Cleanse wound with mild soap and water Wound #2 Right,Medial Ankle o Clean wound with Normal Saline. o Cleanse wound with mild soap and water Wound #3 Right,Lateral Malleolus o Clean wound with Normal Saline. o Cleanse wound with mild soap and water Primary Wound Dressing Wound #1 Left,Lateral Foot o Silver Collagen - moisten with saline Secondary Dressing Wound #1 Left,Lateral Foot o ABD and Kerlix/Conform Wound #2 Right,Medial Ankle o ABD and Kerlix/Conform Wound #3 Right,Lateral Malleolus o ABD and Kerlix/Conform Dressing Change Frequency Wound #1 Left,Lateral Foot o Change dressing every other day. Wound #2 Right,Medial Ankle o Change dressing every other day. Wound #3 Right,Lateral Malleolus o Change dressing every other day. Follow-up Appointments Wound #1 Left,Lateral Foot o Return Appointment in 2 weeks. Wound #2 Right,Medial Ankle Michael Chang, Michael Chang ([id number]) o Return Appointment in 2 weeks. Wound #3 Right,Lateral Malleolus o Return Appointment in 2 weeks. Edema Control Wound #1 Left,Lateral Foot o Other: - tubi grip F bilateral Wound #2 Right,Medial Ankle o Other: - tubi grip F bilateral Wound #3 Right,Lateral Malleolus o Other: - tubi grip F bilateral Electronic Signature(s) Signed: 01/14/2020 3:28:23 PM By: Army Melia Signed: 01/14/2020 5:42:16 PM By: Worthy Keeler PA-C Entered By: Army Melia on  01/14/2020 13:24:01 Michael Chang ([id number]) -------------------------------------------------------------------------------- Problem List Details Patient Name: Michael Chang Date of Service: 01/14/2020 12:45 PM Medical Record Number:  [id number] Patient Account Number: [id number] Date of Birth/Sex: [birth year] (74 y.o. M) Treating RN: Army Melia Primary Care Provider: Alma Friendly Other Clinician: Referring Provider: Alma Friendly Treating Provider/Extender: Melburn Hake, Khanh Cordner Weeks in Treatment: 1 Active Problems ICD-10 Evaluated Encounter Code Description Active Date Today Diagnosis I87.2 Venous insufficiency (chronic) (peripheral) 01/07/2020 No Yes I89.0 Lymphedema, not elsewhere classified 01/07/2020 No Yes L97.522 Non-pressure chronic ulcer of other part of left foot with fat layer exposed 01/07/2020 No Yes L97.312 Non-pressure chronic ulcer of right ankle with fat layer exposed 01/07/2020 No Yes I10 Essential (primary) hypertension 01/07/2020 No Yes I25.10 Atherosclerotic heart disease of native coronary artery without angina 01/07/2020 No Yes pectoris Inactive Problems Resolved Problems Electronic Signature(s) Signed: 01/14/2020 1:18:02 PM By: Worthy Keeler PA-C Entered By: Worthy Keeler on 01/14/2020 13:18:01 Michael Chang ([id number]) -------------------------------------------------------------------------------- Progress Note Details Patient Name: Michael Chang Date of Service: 01/14/2020 12:45 PM Medical Record Number: [id number] Patient Account Number: [id number] Date of Birth/Sex: [birth year] (73 y.o. M) Treating RN: Army Melia Primary Care Provider: Alma Friendly Other Clinician: Referring Provider: Alma Friendly Treating Provider/Extender: Melburn Hake, Diamonte Stavely Weeks in Treatment: 1 Subjective Chief Complaint Information obtained from Patient Right foot and ankle ulcers History of Present Illness (HPI) 01/07/2020 patient presents today for initial evaluation  in our clinic concerning issues that he has been having with ulcers on his bilateral feet. More specifically at his left foot and his right ankle and on the ankle it is medial and lateral. With that being said these wounds have apparently been present for a significant amount of time in fact the patient states "20 years". When I questioned this and even ask his family member that was with him today she states that the last time she saw them was around 2006 and they were not this bad. Nonetheless I think he has had this for a significant amount of time although I am unsure of the exact locations of wounds and if some have come and gone. He does have chronic venous insufficiency, lymphedema, hypertension, and has had a heart attack in the recent past few months. Subsequently he just moved from [place] to this area to live with family. His hypertension has been very uncontrolled he does see Dr. [name] and was placed on amlodipine 5 mg. With that being said unfortunately this does not seem according to family to have helped much with his blood pressure currently though to be honest it somewhat hard to know for sure as he had not taken the medicine before he came in today his blood pressure was significantly elevated but apparently it was when he saw his primary on Tuesday as well but then by the time he took his medicine got to the cardiologist this was much lower dropping from around 767 systolic to around 209. Nonetheless my suggestion in this regard is can be for her to keep a track of his blood pressure readings morning noon and night in order to see how this goes throughout the day for the next week and then we will address it following. In regard to the wounds we are going to see what we can do as far as helping with this currently. He also goes to vascular for testing later today. 01/14/2020 upon evaluation today patient's wound actually appears to be doing quite well in general at all locations. He  has a lot of new epithelial growth which is great news. Fortunately there is no signs of active infection at this time. He did have  his arterial study on the right this was a ABI of 1.44 with a TBI of 1.10 on the left an ABI of 1.46 with a TBI of 0.93. The good news is he has no signs of arterial insufficiency. The patient also did have blood pressure readings that his family member took over the past week morning and evening. Consistently he was much lower in the evening than he was in the morning. They feel like that his amlodipine probably needs to be increased again I explained that I would give there cardiologist, Dr. [name], a call but that that is not something that I would manage here at the wound care center as far as his blood pressure is concerned. Be more than happy to relay that information to Dr. Tyrell Antonio office however. Objective Constitutional Well-nourished and well-hydrated in no acute distress. Vitals Time Taken: 12:40 PM, Height: 71 in, Weight: 138 lbs, BMI: 19.2, Temperature: 98.7 F, Pulse: 54 bpm, Respiratory Rate: 16 breaths/min, Blood Pressure: 191/98 mmHg. Respiratory normal breathing without difficulty. Psychiatric this patient is able to make decisions and demonstrates good insight into disease process. Alert and Oriented x 3. pleasant and cooperative. General Notes: Upon inspection patient's wounds again showed signs of excellent epithelization I am extremely pleased with how things seem to be progressing and overall I think the patient is doing quite nicely. Integumentary (Hair, Skin) Wound #1 status is Open. Original cause of wound was Blister. The wound is located on the Left,Lateral Foot. The wound measures 0.6cm length x 0.6cm width x 0.1cm depth; 0.283cm^2 area and 0.028cm^3 volume. There is Fat Layer (Subcutaneous Tissue) Exposed exposed. There is no tunneling or undermining noted. There is a medium amount of serous drainage noted. The wound margin is  indistinct and nonvisible. There is no Michael Chang, Michael Chang ([id number]) granulation within the wound bed. There is a large (67-100%) amount of necrotic tissue within the wound bed including Adherent Slough. Wound #2 status is Open. Original cause of wound was Gradually Appeared. The wound is located on the Right,Medial Ankle. The wound measures 7.2cm length x 5.1cm width x 0.4cm depth; 28.84cm^2 area and 11.536cm^3 volume. There is Fat Layer (Subcutaneous Tissue) Exposed exposed. There is no tunneling or undermining noted. There is a medium amount of serous drainage noted. The wound margin is thickened. There is large (67- 100%) pink granulation within the wound bed. There is a small (1-33%) amount of necrotic tissue within the wound bed including Adherent Slough. Wound #3 status is Open. Original cause of wound was Gradually Appeared. The wound is located on the Right,Lateral Malleolus. The wound measures 1.1cm length x 0.9cm width x 0.1cm depth; 0.778cm^2 area and 0.078cm^3 volume. There is Fat Layer (Subcutaneous Tissue) Exposed exposed. There is no tunneling or undermining noted. There is a medium amount of serous drainage noted. The wound margin is indistinct and nonvisible. There is medium (34-66%) pink granulation within the wound bed. There is a medium (34-66%) amount of necrotic tissue within the wound bed including Adherent Slough. 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 Cleanse wound with mild soap and water Wound #2 Right,Medial Ankle: Clean wound with Normal Saline. Cleanse  wound with mild soap and water Wound #3  Right,Lateral Malleolus: Clean wound with Normal Saline. Cleanse wound with mild soap and water Primary Wound Dressing: Wound #1 Left,Lateral Foot: Silver Collagen - moisten with saline Secondary Dressing: Wound #1 Left,Lateral Foot: ABD and Kerlix/Conform Wound #2 Right,Medial Ankle: ABD and Kerlix/Conform Wound #3 Right,Lateral Malleolus: ABD and Kerlix/Conform Dressing Change Frequency: Wound #1 Left,Lateral Foot: Change dressing every other day. Wound #2 Right,Medial Ankle: Change dressing every other day. Wound #3 Right,Lateral Malleolus: Change dressing every other day. Follow-up Appointments: Wound #1 Left,Lateral Foot: Return Appointment in 2 weeks. Wound #2 Right,Medial Ankle: Return Appointment in 2 weeks. Wound #3 Right,Lateral Malleolus: Return Appointment in 2 weeks. Edema Control: Michael Chang, Michael Chang ([id number]) Wound #1 Left,Lateral Foot: Other: - tubi grip F bilateral Wound #2 Right,Medial Ankle: Other: - tubi grip F bilateral Wound #3 Right,Lateral Malleolus: Other: - tubi grip F bilateral 1. my suggestion currently is can be that we go ahead and continue with the wound care measures as before specifically with regard to the silver collagen to all wound locations. We will then subsequently also continue to put the ABD pads over secure with gauze and use the Tubigrip. His family member has been changing this morning that she is doing an excellent job as far as I am concerned we could compression wrap him but I am not sure he would derive any benefit from switching this up. 2. I am also can recommend he continue to elevate his legs is much as possible. 3. I would also recommend that we continue to monitor for infection though I see no evidence of infection at this time which is excellent news. 4. With regard to the blood pressure readings I did actually contact Dr. Tyrell Antonio office and spoke with his nurse and relayed the  readings she took note of several of them and how they differed from being much higher in the morning too much lower in the evening. This again spans over the past week. She states that she is going to discuss this with Dr. [name] and then they will subsequently give the patient a call from their office to advise what to do. I appreciated her time and consideration in taking my call. We will see patient back for reevaluation in 2 weeks here in the clinic. If anything worsens or changes patient will contact our office for additional recommendations. Electronic Signature(s) Signed: 01/14/2020 5:19:47 PM By: Worthy Keeler PA-C Previous Signature: 01/14/2020 5:17:45 PM Version By: Worthy Keeler PA-C Entered By: Worthy Keeler on 01/14/2020 17:19:47 Michael Chang ([id number]) -------------------------------------------------------------------------------- SuperBill Details Patient Name: Michael Chang Date of Service: 01/14/2020 Medical Record Number: [id number] Patient Account Number: [id number] Date of Birth/Sex: 09-04-1946 (73 y.o. M) Treating RN: Army Melia Primary Care Provider: Alma Friendly Other Clinician: Referring Provider: Alma Friendly Treating Provider/Extender: Melburn Hake, Jovany Disano Weeks in Treatment: 1 Diagnosis Coding ICD-10 Codes Code Description I87.2 Venous insufficiency (chronic) (peripheral) I89.0 Lymphedema, not elsewhere classified L97.522 Non-pressure chronic ulcer of other part of left foot with fat layer exposed L97.312 Non-pressure chronic ulcer of right ankle with fat layer exposed I10 Essential (primary) hypertension I25.10 Atherosclerotic heart disease of native coronary artery without angina pectoris Facility Procedures CPT4 Code: [id number] Description: 99214 - WOUND CARE VISIT-LEV 4 EST PT Modifier: Quantity: 1 Physician Procedures CPT4 Code: 0923300 Description: 99214 - WC PHYS LEVEL 4 - EST PT Modifier: Quantity: 1 CPT4 Code: Description:  ICD-10 Diagnosis Description I87.2 Venous insufficiency (chronic) (peripheral) I89.0 Lymphedema, not elsewhere classified L97.522 Non-pressure chronic ulcer of other part of left foot  with fat layer expose L97.312 Non-pressure chronic ulcer  of right ankle with fat layer exposed Modifier: d Quantity: Electronic Signature(s) Signed: 01/14/2020 5:18:17 PM By: Worthy Keeler PA-C Entered By: Worthy Keeler on 01/14/2020 17:18:17

## 2020-01-14 NOTE — Telephone Encounter (Addendum)
Incoming call transferred from the [place]. Spoke with Jeri Cos, PA @ the wound center. Margarita Grizzle sts that the patient has contacted their office to report consistently elevated BP readings. BP readings tend to be higher in the morning.  Patient was started on Amlodipine 5mg  qd at his 01/07/20 appt wit Dr. [name]. He is also taking his other anti-hypertensive medications as prescribed.  Patient reports the following BP readings below:  216/99 am 169/79 pm  193/71 am 179/70 pm  200/107 am 146/69 pm  Margarita Grizzle adv the patient that he would not be the appropriate provider to manage his hypertension and adjust his medications. He did adv the patient that he will get an update to Dr. [name] for his recommendation and that in the interim the patient should increase Amlodipine to 5 mg bid. Margarita Grizzle sts that the patients treatment with wound care is going well and the patient has no signs of infection. Alison Stalling that I will fwd the update to Dr. [name] and f/u with the patient directly with Dr. Tyrell Antonio recommendation. Margarita Grizzle voiced appreciation for the assistance.

## 2020-01-14 NOTE — Progress Notes (Signed)
Michael Chang, Michael Chang ([id number]) Visit Report for 01/14/2020 Arrival Information Details Patient Name: Michael Chang, Michael Chang Date of Service: 01/14/2020 12:45 PM Medical Record Number: [id number] Patient Account Number: [id number] Date of Birth/Sex: 1946/02/09 (74 y.o. M) Treating RN: Army Melia Primary Care Michael Chang: Alma Friendly Other Clinician: Referring Michael Chang: Alma Friendly Treating Kemya Shed/Extender: Melburn Hake, HOYT Weeks in Treatment: 1 Visit Information History Since Last Visit Added or deleted any medications: No Patient Arrived: Wheel Chair Any new allergies or adverse reactions: No Arrival Time: 12:43 Had a fall or experienced change in No Accompanied By: sister activities of daily living that may affect Transfer Assistance: None risk of falls: Signs or symptoms of abuse/neglect since last visito No Hospitalized since last visit: No Implantable device outside of the clinic excluding No cellular tissue based products placed in the center since last visit: Has Dressing in Place as Prescribed: Yes Pain Present Now: No Electronic Signature(s) Signed: 01/14/2020 3:47:49 PM By: Lorine Bears RCP, RRT, CHT Entered By: Lorine Bears on 01/14/2020 12:44:50 Michael Chang ([id number]) -------------------------------------------------------------------------------- Clinic Level of Care Assessment Details Patient Name: Michael Chang Date of Service: 01/14/2020 12:45 PM Medical Record Number: [id number] Patient Account Number: [id number] Date of Birth/Sex: 1946/09/09 (74 y.o. M) Treating RN: Army Melia Primary Care Michael Chang: Alma Friendly Other Clinician: Referring Michael Chang: Alma Friendly Treating Michael Chang/Extender: Melburn Hake, HOYT Weeks in Treatment: 1 Clinic Level of Care Assessment Items TOOL 4 Quantity Score []  - Use when only an EandM is performed on FOLLOW-UP visit 0 ASSESSMENTS - Nursing Assessment / Reassessment X - Reassessment of  Co-morbidities (includes updates in patient status) 1 10 X- 1 5 Reassessment of Adherence to Treatment Plan ASSESSMENTS - Wound and Skin Assessment / Reassessment []  - Simple Wound Assessment / Reassessment - one wound 0 X- 3 5 Complex Wound Assessment / Reassessment - multiple wounds []  - 0 Dermatologic / Skin Assessment (not related to wound area) ASSESSMENTS - Focused Assessment []  - Circumferential Edema Measurements - multi extremities 0 []  - 0 Nutritional Assessment / Counseling / Intervention []  - 0 Lower Extremity Assessment (monofilament, tuning fork, pulses) []  - 0 Peripheral Arterial Disease Assessment (using hand held doppler) ASSESSMENTS - Ostomy and/or Continence Assessment and Care []  - Incontinence Assessment and Management 0 []  - 0 Ostomy Care Assessment and Management (repouching, etc.) PROCESS - Coordination of Care X - Simple Patient / Family Education for ongoing care 1 15 []  - 0 Complex (extensive) Patient / Family Education for ongoing care []  - 0 Staff obtains Programmer, systems, Records, Test Results / Process Orders []  - 0 Staff telephones HHA, Nursing Homes / Clarify orders / etc []  - 0 Routine Transfer to another Facility (non-emergent condition) []  - 0 Routine Hospital Admission (non-emergent condition) []  - 0 New Admissions / Biomedical engineer / Ordering NPWT, Apligraf, etc. []  - 0 Emergency Hospital Admission (emergent condition) X- 1 10 Simple Discharge Coordination []  - 0 Complex (extensive) Discharge Coordination PROCESS - Special Needs []  - Pediatric / Minor Patient Management 0 []  - 0 Isolation Patient Management []  - 0 Hearing / Language / Visual special needs []  - 0 Assessment of Community assistance (transportation, D/C planning, etc.) Michael Chang ([id number]) []  - 0 Additional assistance / Altered mentation []  - 0 Support Surface(s) Assessment (bed, cushion, seat, etc.) INTERVENTIONS - Wound Cleansing / Measurement []  -  Simple Wound Cleansing - one wound 0 X- 3 5 Complex Wound Cleansing - multiple wounds X- 1 5 Wound Imaging (photographs - any number of wounds) []  - 0 Wound  Tracing (instead of photographs) []  - 0 Simple Wound Measurement - one wound X- 3 5 Complex Wound Measurement - multiple wounds INTERVENTIONS - Wound Dressings []  - Small Wound Dressing one or multiple wounds 0 X- 3 15 Medium Wound Dressing one or multiple wounds []  - 0 Large Wound Dressing one or multiple wounds []  - 0 Application of Medications - topical []  - 0 Application of Medications - injection INTERVENTIONS - Miscellaneous []  - External ear exam 0 []  - 0 Specimen Collection (cultures, biopsies, blood, body fluids, etc.) []  - 0 Specimen(s) / Culture(s) sent or taken to Lab for analysis []  - 0 Patient Transfer (multiple staff / Civil Service fast streamer / Similar devices) []  - 0 Simple Staple / Suture removal (25 or less) []  - 0 Complex Staple / Suture removal (26 or more) []  - 0 Hypo / Hyperglycemic Management (close monitor of Blood Glucose) []  - 0 Ankle / Brachial Index (ABI) - do not check if billed separately X- 1 5 Vital Signs Has the patient been seen at the hospital within the last three years: Yes Total Score: 140 Level Of Care: New/Established - Level 4 Electronic Signature(s) Signed: 01/14/2020 3:28:23 PM By: Army Melia Entered By: Army Melia on 01/14/2020 13:24:51 Michael Chang ([id number]) -------------------------------------------------------------------------------- Encounter Discharge Information Details Patient Name: Michael Chang Date of Service: 01/14/2020 12:45 PM Medical Record Number: [id number] Patient Account Number: [id number] Date of Birth/Sex: 07-25-1946 (74 y.o. M) Treating RN: Army Melia Primary Care Denean Pavon: Alma Friendly Other Clinician: Referring Kearie Mennen: Alma Friendly Treating Camillo Quadros/Extender: Melburn Hake, HOYT Weeks in Treatment: 1 Encounter Discharge Information  Items Discharge Condition: Stable Ambulatory Status: Ambulatory Discharge Destination: Home Transportation: Private Auto Accompanied By: daughter Schedule Follow-up Appointment: Yes Clinical Summary of Care: Electronic Signature(s) Signed: 01/14/2020 3:28:23 PM By: Army Melia Entered By: Army Melia on 01/14/2020 13:25:48 Michael Chang ([id number]) -------------------------------------------------------------------------------- Lower Extremity Assessment Details Patient Name: Michael Chang Date of Service: 01/14/2020 12:45 PM Medical Record Number: [id number] Patient Account Number: [id number] Date of Birth/Sex: 10-Nov-1945 (73 y.o. M) Treating RN: Montey Hora Primary Care Wenceslaus Gist: Alma Friendly Other Clinician: Referring Kishawn Pickar: Alma Friendly Treating Umi Mainor/Extender: Melburn Hake, HOYT Weeks in Treatment: 1 Edema Assessment Assessed: [Left: No] [Right: No] Edema: [Left: Yes] [Right: Yes] Vascular Assessment Pulses: Dorsalis Pedis Palpable: [Left:Yes] [Right:Yes] Blood Pressure: Brachial: [Left:123] [Right:123] Dorsalis Pedis: 179 [Left:Dorsalis Pedis: 188] Ankle: Posterior Tibial: 184 [Left:Posterior Tibial: 183 1.50] [Right:1.49] Notes ABI from [facility] 01/07/20 with TBI L .93 and R 1.10 Electronic Signature(s) Signed: 01/14/2020 3:40:49 PM By: Montey Hora Entered By: Montey Hora on 01/14/2020 12:59:43 Michael Chang ([id number]) -------------------------------------------------------------------------------- Multi Wound Chart Details Patient Name: Michael Chang Date of Service: 01/14/2020 12:45 PM Medical Record Number: [id number] Patient Account Number: [id number] Date of Birth/Sex: 1946/03/13 (74 y.o. M) Treating RN: Army Melia Primary Care Melania Kirks: Alma Friendly Other Clinician: Referring Genavive Kubicki: Alma Friendly Treating Juliani Laduke/Extender: Melburn Hake, HOYT Weeks in Treatment: 1 Vital Signs Height(in): 71 Pulse(bpm): 33 Weight(lbs):  138 Blood Pressure(mmHg): 191/98 Body Mass Index(BMI): 19 Temperature(F): 98.7 Respiratory Rate(breaths/min): 16 Photos: Wound Location: Left, Lateral Foot Right, Medial Ankle Right, Lateral Malleolus Wounding Event: Blister Gradually Appeared Gradually Appeared Primary Etiology: Venous Leg Ulcer Venous Leg Ulcer Venous Leg Ulcer Comorbid History: Hypertension, Myocardial Infarction, Hypertension, Myocardial Infarction, Hypertension, Myocardial Infarction, Peripheral Venous Disease, End Peripheral Venous Disease, End Peripheral Venous Disease, End Stage Renal Disease Stage Renal Disease Stage Renal Disease Date Acquired: 01/07/2019 01/07/2019 01/07/2019 Weeks of Treatment: 1 1 1  Wound Status: Open Open Open Measurements L x W x D (  cm) 0.6x0.6x0.1 7.2x5.1x0.4 1.1x0.9x0.1 Area (cm) : 0.283 28.84 0.778 Volume (cm) : 0.028 11.536 0.078 % Reduction in Area: 74.30% 11.70% -3.20% % Reduction in Volume: 74.50% 11.70% -4.00% Classification: Full Thickness Without Exposed Full Thickness Without Exposed Full Thickness Without Exposed Support Structures Support Structures Support Structures Exudate Amount: Medium Medium Medium Exudate Type: Serous Serous Serous Exudate Color: amber amber amber Wound Margin: Indistinct, nonvisible Thickened Indistinct, nonvisible Granulation Amount: None Present (0%) Large (67-100%) Medium (34-66%) Granulation Quality: N/A Pink Pink Necrotic Amount: Large (67-100%) Small (1-33%) Medium (34-66%) Exposed Structures: Fat Layer (Subcutaneous Tissue) Fat Layer (Subcutaneous Tissue) Fat Layer (Subcutaneous Tissue) Exposed: Yes Exposed: Yes Exposed: Yes Fascia: No Fascia: No Fascia: No Tendon: No Tendon: No Tendon: No Muscle: No Muscle: No Muscle: No Joint: No Joint: No Joint: No Bone: No Bone: No Bone: No Epithelialization: None None None Treatment Notes Electronic Signature(s) Signed: 01/14/2020 3:28:23 PM By: Vinnie Langton  ([id number]) Entered By: Army Melia on 01/14/2020 13:20:12 Michael Chang ([id number]) -------------------------------------------------------------------------------- Multi-Disciplinary Care Plan Details Patient Name: Michael Chang Date of Service: 01/14/2020 12:45 PM Medical Record Number: [id number] Patient Account Number: [id number] Date of Birth/Sex: September 04, 1946 (73 y.o. M) Treating RN: Army Melia Primary Care Shantana Christon: Alma Friendly Other Clinician: Referring Chace Klippel: Alma Friendly Treating Ocia Simek/Extender: Melburn Hake, HOYT Weeks in Treatment: 1 Active Inactive Orientation to the Wound Care Program Nursing Diagnoses: Knowledge deficit related to the wound healing center program Goals: Patient/caregiver will verbalize understanding of the [place] Program Date Initiated: 01/08/2020 Target Resolution Date: 02/05/2020 Goal Status: Active Interventions: Provide education on orientation to the wound center Notes: Venous Leg Ulcer Nursing Diagnoses: Potential for venous Insuffiency (use before diagnosis confirmed) Goals: Patient/caregiver will verbalize understanding of disease process and disease management Date Initiated: 01/08/2020 Target Resolution Date: 02/05/2020 Goal Status: Active Interventions: Assess peripheral edema status every visit. Notes: Wound/Skin Impairment Nursing Diagnoses: Impaired tissue integrity Goals: Ulcer/skin breakdown will have a volume reduction of 30% by week 4 Date Initiated: 01/08/2020 Target Resolution Date: 02/05/2020 Goal Status: Active Interventions: Assess ulceration(s) every visit Notes: Electronic Signature(s) Signed: 01/14/2020 3:28:23 PM By: Army Melia Entered By: Army Melia on 01/14/2020 13:20:03 Michael Chang, Michael Chang ([id number]) Michael Chang, Michael Chang ([id number]) -------------------------------------------------------------------------------- Pain Assessment Details Patient Name: Michael Chang Date of Service:  01/14/2020 12:45 PM Medical Record Number: [id number] Patient Account Number: [id number] Date of Birth/Sex: 1946-02-12 (74 y.o. M) Treating RN: Montey Hora Primary Care Ronika Kelson: Alma Friendly Other Clinician: Referring Armya Westerhoff: Alma Friendly Treating Samrat Hayward/Extender: Melburn Hake, HOYT Weeks in Treatment: 1 Active Problems Location of Pain Severity and Description of Pain Patient Has Paino No Site Locations Pain Management and Medication Current Pain Management: Electronic Signature(s) Signed: 01/14/2020 3:40:49 PM By: Montey Hora Entered By: Montey Hora on 01/14/2020 12:47:01 Michael Chang ([id number]) -------------------------------------------------------------------------------- Patient/Caregiver Education Details Patient Name: Michael Chang Date of Service: 01/14/2020 12:45 PM Medical Record Number: [id number] Patient Account Number: [id number] Date of Birth/Gender: [birth year] (73 y.o. M) Treating RN: Army Melia Primary Care Physician: Alma Friendly Other Clinician: Referring Physician: Alma Friendly Treating Physician/Extender: Sharalyn Ink in Treatment: 1 Education Assessment Education Provided To: Patient Education Topics Provided Wound/Skin Impairment: Handouts: Caring for Your Ulcer Methods: Demonstration, Explain/Verbal Responses: State content correctly Electronic Signature(s) Signed: 01/14/2020 3:28:23 PM By: Army Melia Entered By: Army Melia on 01/14/2020 13:25:02 Michael Chang ([id number]) -------------------------------------------------------------------------------- Wound Assessment Details Patient Name: Michael Chang Date of Service: 01/14/2020 12:45 PM Medical Record Number: [id number] Patient Account Number: [id number] Date of Birth/Sex: [birth year] (73 y.o. M) Treating RN: Montey Hora  Primary Care Vartan Kerins: Alma Friendly Other Clinician: Referring Colonel Krauser: Alma Friendly Treating Tareva Leske/Extender: Melburn Hake,  HOYT Weeks in Treatment: 1 Wound Status Wound Number: 1 Primary Venous Leg Ulcer Etiology: Wound Location: Left, Lateral Foot Wound Open Wounding Event: Blister Status: Date Acquired: 01/07/2019 Comorbid Hypertension, Myocardial Infarction, Peripheral Venous Weeks Of Treatment: 1 History: Disease, End Stage Renal Disease Clustered Wound: No Photos Wound Measurements Length: (cm) 0.6 Width: (cm) 0.6 Depth: (cm) 0.1 Area: (cm) 0.283 Volume: (cm) 0.028 % Reduction in Area: 74.3% % Reduction in Volume: 74.5% Epithelialization: None Tunneling: No Undermining: No Wound Description Classification: Full Thickness Without Exposed Support Structu Wound Margin: Indistinct, nonvisible Exudate Amount: Medium Exudate Type: Serous Exudate Color: amber res Foul Odor After Cleansing: No Slough/Fibrino Yes Wound Bed Granulation Amount: None Present (0%) Exposed Structure Necrotic Amount: Large (67-100%) Fascia Exposed: No Necrotic Quality: Adherent Slough Fat Layer (Subcutaneous Tissue) Exposed: Yes Tendon Exposed: No Muscle Exposed: No Joint Exposed: No Bone Exposed: No Treatment Notes Wound #1 (Left, Lateral Foot) Notes collagen, ABD, conform, tubi Electronic Signature(s) Signed: 01/14/2020 3:40:49 PM By: Anne Shutter ([id number]) Entered By: Montey Hora on 01/14/2020 12:57:52 Michael Chang ([id number]) -------------------------------------------------------------------------------- Wound Assessment Details Patient Name: Michael Chang Date of Service: 01/14/2020 12:45 PM Medical Record Number: [id number] Patient Account Number: [id number] Date of Birth/Sex: [birth year] (74 y.o. M) Treating RN: Montey Hora Primary Care Jandi Swiger: Alma Friendly Other Clinician: Referring Caelin Rosen: Alma Friendly Treating Willodene Stallings/Extender: Melburn Hake, HOYT Weeks in Treatment: 1 Wound Status Wound Number: 2 Primary Venous Leg Ulcer Etiology: Wound Location:  Right, Medial Ankle Wound Open Wounding Event: Gradually Appeared Status: Date Acquired: 01/07/2019 Comorbid Hypertension, Myocardial Infarction, Peripheral Venous Weeks Of Treatment: 1 History: Disease, End Stage Renal Disease Clustered Wound: No Photos Wound Measurements Length: (cm) 7.2 Width: (cm) 5.1 Depth: (cm) 0.4 Area: (cm) 28.84 Volume: (cm) 11.536 % Reduction in Area: 11.7% % Reduction in Volume: 11.7% Epithelialization: None Tunneling: No Undermining: No Wound Description Classification: Full Thickness Without Exposed Support Structu Wound Margin: Thickened Exudate Amount: Medium Exudate Type: Serous Exudate Color: amber res Foul Odor After Cleansing: No Slough/Fibrino Yes Wound Bed Granulation Amount: Large (67-100%) Exposed Structure Granulation Quality: Pink Fascia Exposed: No Necrotic Amount: Small (1-33%) Fat Layer (Subcutaneous Tissue) Exposed: Yes Necrotic Quality: Adherent Slough Tendon Exposed: No Muscle Exposed: No Joint Exposed: No Bone Exposed: No Treatment Notes Wound #2 (Right, Medial Ankle) Notes collagen, ABD, conform, tubi Electronic Signature(s) Signed: 01/14/2020 3:40:49 PM By: Anne Shutter ([id number]) Entered By: Montey Hora on 01/14/2020 12:58:11 Michael Chang ([id number]) -------------------------------------------------------------------------------- Wound Assessment Details Patient Name: Michael Chang Date of Service: 01/14/2020 12:45 PM Medical Record Number: [id number] Patient Account Number: [id number] Date of Birth/Sex: 1946/08/10 (74 y.o. M) Treating RN: Montey Hora Primary Care Flynn Lininger: Alma Friendly Other Clinician: Referring Caedon Bond: Alma Friendly Treating Daphene Chisholm/Extender: Melburn Hake, HOYT Weeks in Treatment: 1 Wound Status Wound Number: 3 Primary Venous Leg Ulcer Etiology: Wound Location: Right, Lateral Malleolus Wound Open Wounding Event: Gradually Appeared Status: Date  Acquired: 01/07/2019 Comorbid Hypertension, Myocardial Infarction, Peripheral Venous Weeks Of Treatment: 1 History: Disease, End Stage Renal Disease Clustered Wound: No Photos Wound Measurements Length: (cm) 1.1 Width: (cm) 0.9 Depth: (cm) 0.1 Area: (cm) 0.778 Volume: (cm) 0.078 % Reduction in Area: -3.2% % Reduction in Volume: -4% Epithelialization: None Tunneling: No Undermining: No Wound Description Classification: Full Thickness Without Exposed Support Structu Wound Margin: Indistinct, nonvisible Exudate Amount: Medium Exudate Type: Serous Exudate Color: amber res Foul Odor After Cleansing: No Slough/Fibrino Yes Wound Bed Granulation  Amount: Medium (34-66%) Exposed Structure Granulation Quality: Pink Fascia Exposed: No Necrotic Amount: Medium (34-66%) Fat Layer (Subcutaneous Tissue) Exposed: Yes Necrotic Quality: Adherent Slough Tendon Exposed: No Muscle Exposed: No Joint Exposed: No Bone Exposed: No Treatment Notes Wound #3 (Right, Lateral Malleolus) Notes collagen, ABD, conform, tubi Electronic Signature(s) Signed: 01/14/2020 3:40:49 PM By: Anne Shutter ([id number]) Entered By: Montey Hora on 01/14/2020 12:58:31 Michael Chang ([id number]) -------------------------------------------------------------------------------- Vitals Details Patient Name: Michael Chang Date of Service: 01/14/2020 12:45 PM Medical Record Number: [id number] Patient Account Number: [id number] Date of Birth/Sex: Apr 07, 1946 (74 y.o. M) Treating RN: Army Melia Primary Care Ellianne Gowen: Alma Friendly Other Clinician: Referring Sharonna Vinje: Alma Friendly Treating Shylo Dillenbeck/Extender: Melburn Hake, HOYT Weeks in Treatment: 1 Vital Signs Time Taken: 12:40 Temperature (F): 98.7 Height (in): 71 Pulse (bpm): 54 Weight (lbs): 138 Respiratory Rate (breaths/min): 16 Body Mass Index (BMI): 19.2 Blood Pressure (mmHg): 191/98 Reference Range: 80 - 120 mg / dl Electronic  Signature(s) Signed: 01/14/2020 3:47:49 PM By: Lorine Bears RCP, RRT, CHT Entered By: Lorine Bears on 01/14/2020 12:45:21

## 2020-01-14 NOTE — Telephone Encounter (Signed)
His blood pressure machine might not be accurate given that his blood pressure with [place] and with Dr. Delana Meyer was not that elevated.  He should bring his blood pressure machine to his next visit to check with our reading.  He can continue amlodipine 5 mg twice daily for now in addition to other antihypertensive medications.

## 2020-01-14 NOTE — Telephone Encounter (Signed)
Pt c/o BP issue: STAT if pt c/o blurred vision, one-sided weakness or slurred speech  1. What are your last 5 BP readings? Provider has readings  2. Are you having any other symptoms (ex. Dizziness, headache, blurred vision, passed out)?  Unknown   3. What is your BP issue? Provider at wound center wants to discuss readings

## 2020-01-15 MED ORDER — ISOSORBIDE MONONITRATE ER 30 MG PO TB24: 30.0000 mg | ORAL_TABLET | Freq: Every day | ORAL | 1 refills | Status: DC

## 2020-01-15 MED ORDER — HYDRALAZINE HCL 100 MG PO TABS: 100.0000 mg | ORAL_TABLET | Freq: Two times a day (BID) | ORAL | 1 refills | Status: DC

## 2020-01-15 MED ORDER — CARVEDILOL 12.5 MG PO TABS: 12.5000 mg | ORAL_TABLET | Freq: Two times a day (BID) | ORAL | 1 refills | Status: DC

## 2020-01-15 MED ORDER — AMLODIPINE BESYLATE 5 MG PO TABS: 5.0000 mg | ORAL_TABLET | Freq: Two times a day (BID) | ORAL | 5 refills | Status: DC

## 2020-01-15 MED ORDER — FUROSEMIDE 40 MG PO TABS: 40.0000 mg | ORAL_TABLET | Freq: Every day | ORAL | 1 refills | Status: DC

## 2020-01-15 NOTE — Telephone Encounter (Signed)
Phone number listed for the patient belongs to his sister Solmon Ice. Spoke with the patients sister and made her aware of Dr. Tyrell Antonio response and recommendation. The patient will need an updated Rx for the increased dosage of Amlodipine 5mg  bid. Rx sent to the patients pharmacy.  The patient has recently relocated from [place] and needs refills on all his other BP and cardiac medications. Message sent to Dr. [name] for an ok to refill the medications.

## 2020-01-15 NOTE — Telephone Encounter (Signed)
Called to give the patient Dr. Arida's response and recommendation. [email or web address]. 

## 2020-01-15 NOTE — Telephone Encounter (Signed)
Completed and placed in Chan's inbox. We need medication lists attached to each form. She will pick up Monday next week.

## 2020-01-15 NOTE — Telephone Encounter (Signed)
Yes okay to refill cardiac medications.

## 2020-01-15 NOTE — Telephone Encounter (Signed)
Noted.Cardiac medications refilled.

## 2020-01-15 NOTE — Telephone Encounter (Signed)
There is a MyChart message from the sister regarding this

## 2020-01-15 NOTE — Telephone Encounter (Signed)
Printed and attached  with copies. Left in the front office.

## 2020-01-29 ENCOUNTER — Encounter: Payer: Medicare Other | Admitting: Physician Assistant

## 2020-01-29 ENCOUNTER — Other Ambulatory Visit
Admission: RE | Admit: 2020-01-29 | Discharge: 2020-01-29 | Disposition: A | Discharge status: Home / Self Care | Payer: Medicare Other | Source: Ambulatory Visit | Attending: Physician Assistant | Admitting: Physician Assistant

## 2020-01-29 ENCOUNTER — Other Ambulatory Visit: Payer: Self-pay

## 2020-01-29 DIAGNOSIS — B999 Unspecified infectious disease: Secondary | ICD-10-CM | POA: Diagnosis present

## 2020-01-29 DIAGNOSIS — L97522 Non-pressure chronic ulcer of other part of left foot with fat layer exposed: Secondary | ICD-10-CM | POA: Diagnosis not present

## 2020-01-29 NOTE — Progress Notes (Addendum)
ASHLEY, MONTMINY ([id number]) Visit Report for 01/29/2020 Chief Complaint Document Details Patient Name: Michael Chang, Michael Chang Date of Service: 01/29/2020 12:30 PM Medical Record Number: [id number] Patient Account Number: [id number] Date of Birth/Sex: 11-17-1945 (74 y.o. M) Treating RN: Montey Hora Primary Care Provider: Alma Friendly Other Clinician: Referring Provider: Alma Friendly Treating Provider/Extender: Melburn Hake, Kyren Vaux Weeks in Treatment: 3 Information Obtained from: Patient Chief Complaint Right foot and ankle ulcers Electronic Signature(s) Signed: 01/29/2020 12:47:31 PM By: Worthy Keeler PA-C Entered By: Worthy Keeler on 01/29/2020 12:47:31 [name] ([id number]) -------------------------------------------------------------------------------- Debridement Details Patient Name: [name] Date of Service: 01/29/2020 12:30 PM Medical Record Number: [id number] Patient Account Number: [id number] Date of Birth/Sex: [birth year] (74 y.o. M) Treating RN: Montey Hora Primary Care Provider: Alma Friendly Other Clinician: Referring Provider: Alma Friendly Treating Provider/Extender: Melburn Hake, Saraann Enneking Weeks in Treatment: 3 Debridement Performed for Wound #2 Right,Medial Ankle Assessment: Performed By: Physician STONE III, Virlan Kempker E., PA-C Debridement Type: Debridement Severity of Tissue Pre Debridement: Fat layer exposed Level of Consciousness (Pre- Awake and Alert procedure): Pre-procedure Verification/Time Out Yes - 13:14 Taken: Start Time: 13:14 Pain Control: Lidocaine 4% Topical Solution Total Area Debrided (L x W): 6.7 (cm) x 4 (cm) = 26.8 (cm) Tissue and other material Viable, Non-Viable, Slough, Subcutaneous, Slough debrided: Level: Skin/Subcutaneous Tissue Debridement Description: Excisional Instrument: Curette Specimen: Swab, Number of Specimens Taken: 1 Bleeding: Minimum Hemostasis Achieved: Pressure End Time: 13:16 Procedural Pain: 0 Post  Procedural Pain: 0 Response to Treatment: Procedure was tolerated well Level of Consciousness (Post- Awake and Alert procedure): Post Debridement Measurements of Total Wound Length: (cm) 6.7 Width: (cm) 4 Depth: (cm) 0.4 Volume: (cm) 8.419 Character of Wound/Ulcer Post Debridement: Improved Severity of Tissue Post Debridement: Fat layer exposed Post Procedure Diagnosis Same as Pre-procedure Electronic Signature(s) Signed: 01/29/2020 4:19:26 PM By: Montey Hora Signed: 01/29/2020 5:05:02 PM By: Worthy Keeler PA-C Entered By: Montey Hora on 01/29/2020 13:18:05 [name] ([id number]) -------------------------------------------------------------------------------- HPI Details Patient Name: [name] Date of Service: 01/29/2020 12:30 PM Medical Record Number: [id number] Patient Account Number: [id number] Date of Birth/Sex: 1945-10-30 (74 y.o. M) Treating RN: Montey Hora Primary Care Provider: Alma Friendly Other Clinician: Referring Provider: Alma Friendly Treating Provider/Extender: Melburn Hake, Kedar Sedano Weeks in Treatment: 3 History of Present Illness HPI Description: 01/07/2020 patient presents today for initial evaluation in our clinic concerning issues that he has been having with ulcers on his bilateral feet. More specifically at his left foot and his right ankle and on the ankle it is medial and lateral. With that being said these wounds have apparently been present for a significant amount of time in fact the patient states "20 years". When I questioned this and even ask his family member that was with him today she states that the last time she saw them was around 2006 and they were not this bad. Nonetheless I think he has had this for a significant amount of time although I am unsure of the exact locations of wounds and if some have come and gone. He does have chronic venous insufficiency, lymphedema, hypertension, and has had a heart attack in the recent past  few months. Subsequently he just moved from [place] to this area to live with family. His hypertension has been very uncontrolled he does see Dr. [name] and was placed on amlodipine 5 mg. With that being said unfortunately this does not seem according to family to have helped much with his blood pressure currently though to be honest it somewhat hard to  know for sure as he had not taken the medicine before he came in today his blood pressure was significantly elevated but apparently it was when he saw his primary on Tuesday as well but then by the time he took his medicine got to the cardiologist this was much lower dropping from around 295 systolic to around 284. Nonetheless my suggestion in this regard is can be for her to keep a track of his blood pressure readings morning noon and night in order to see how this goes throughout the day for the next week and then we will address it following. In regard to the wounds we are going to see what we can do as far as helping with this currently. He also goes to vascular for testing later today. 01/14/2020 upon evaluation today patient's wound actually appears to be doing quite well in general at all locations. He has a lot of new epithelial growth which is great news. Fortunately there is no signs of active infection at this time. He did have his arterial study on the right this was a ABI of 1.44 with a TBI of 1.10 on the left an ABI of 1.46 with a TBI of 0.93. The good news is he has no signs of arterial insufficiency. The patient also did have blood pressure readings that his family member took over the past week morning and evening. Consistently he was much lower in the evening than he was in the morning. They feel like that his amlodipine probably needs to be increased again I explained that I would give there cardiologist, Dr. [name], a call but that that is not something that I would manage here at the wound care center as far as his blood pressure  is concerned. Be more than happy to relay that information to Dr. Tyrell Antonio office however. 01/29/2020 upon evaluation today patient appears to be doing well in general with regard to his wounds. With that being said on the medial aspect he does have some purulent drainage noted which is somewhat reminiscent of potential Pseudomonas although there could be something else going on here as well. Nonetheless I think we do need to obtain a culture also think that antibiotics are probably can be necessary at this point. Electronic Signature(s) Signed: 01/29/2020 1:20:46 PM By: Worthy Keeler PA-C Entered By: Worthy Keeler on 01/29/2020 13:20:46 CLEVER, GERALDO ([id number]) -------------------------------------------------------------------------------- Physical Exam Details Patient Name: [name] Date of Service: 01/29/2020 12:30 PM Medical Record Number: [id number] Patient Account Number: [id number] Date of Birth/Sex: 10-14-1945 (74 y.o. M) Treating RN: Montey Hora Primary Care Provider: Alma Friendly Other Clinician: Referring Provider: Alma Friendly Treating Provider/Extender: Melburn Hake, Lawsen Arnott Weeks in Treatment: 3 Constitutional Well-nourished and well-hydrated in no acute distress. Respiratory normal breathing without difficulty. Psychiatric this patient is able to make decisions and demonstrates good insight into disease process. Alert and Oriented x 3. pleasant and cooperative. Notes Upon inspection patient's wound bed actually showed signs of good granulation at this time. Fortunately there is no evidence of systemic infection although I believe there may be some local cellulitis on the medial wound which is the larger of his wounds. It appears to be a little erythematous and warm to touch as well. I did perform debridement to clear away some of the slough so that I can get a good sample/culture and then subsequently we are going to also place him on an oral antibiotic today  as well. Electronic Signature(s) Signed: 01/29/2020 1:21:15 PM By: Worthy Keeler  PA-C Entered By: Worthy Keeler on 01/29/2020 13:21:15 [name] ([id number]) -------------------------------------------------------------------------------- Physician Orders Details Patient Name: [name] Date of Service: 01/29/2020 12:30 PM Medical Record Number: [id number] Patient Account Number: [id number] Date of Birth/Sex: Jul 01, 1946 (74 y.o. M) Treating RN: Montey Hora Primary Care Provider: Alma Friendly Other Clinician: Referring Provider: Alma Friendly Treating Provider/Extender: Melburn Hake, Siddhant Hashemi Weeks in Treatment: 3 Verbal / Phone Orders: No Diagnosis Coding ICD-10 Coding Code Description I87.2 Venous insufficiency (chronic) (peripheral) I89.0 Lymphedema, not elsewhere classified L97.522 Non-pressure chronic ulcer of other part of left foot with fat layer exposed L97.312 Non-pressure chronic ulcer of right ankle with fat layer exposed I10 Essential (primary) hypertension I25.10 Atherosclerotic heart disease of native coronary 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 o Cleanse wound with mild soap and water Wound #2 Right,Medial Ankle o Clean wound with Normal Saline. o Cleanse wound with mild soap and water Wound #3 Right,Lateral Malleolus o Clean wound with Normal Saline. o Cleanse wound with mild soap and water Primary Wound Dressing Wound #1 Left,Lateral Foot o Silver Collagen - moisten with saline Secondary Dressing Wound #1 Left,Lateral Foot o ABD and Kerlix/Conform Wound #2 Right,Medial Ankle o ABD and Kerlix/Conform Wound #3 Right,Lateral Malleolus o ABD and Kerlix/Conform Dressing Change Frequency Wound #1 Left,Lateral Foot o Change dressing every other day. Wound #2 Right,Medial Ankle o Change dressing every other day. Wound #3 Right,Lateral Malleolus o  Change dressing every other day. Follow-up Appointments o Return Appointment in 1 week. Edema Control Wound #1 Left,Lateral Foot o Other: - tubi grip F bilateral Dorin, Hutchinson ([id number]) Wound #2 Right,Medial Ankle o Other: - tubi grip F bilateral Wound #3 Right,Lateral Malleolus o Other: - tubi grip F bilateral Laboratory o Bacteria identified in Wound by Culture (MICRO) oooo LOINC Code: 3474-2 oooo Convenience Name: Wound culture routine Patient Medications Allergies: furosemide Notifications Medication Indication Start End Levaquin 01/29/2020 DOSE 1 - oral 500 mg tablet - 1 tablet oral taken 1 time per day for 14 days Electronic Signature(s) Signed: 01/29/2020 1:22:22 PM By: Worthy Keeler PA-C Entered By: Worthy Keeler on 01/29/2020 13:22:20 [name] ([id number]) -------------------------------------------------------------------------------- Problem List Details Patient Name: [name] Date of Service: 01/29/2020 12:30 PM Medical Record Number: [id number] Patient Account Number: [id number] Date of Birth/Sex: 1945-10-10 (73 y.o. M) Treating RN: Montey Hora Primary Care Provider: Alma Friendly Other Clinician: Referring Provider: Alma Friendly Treating Provider/Extender: Melburn Hake, Marcellas Marchant Weeks in Treatment: 3 Active Problems ICD-10 Encounter Code Description Active Date MDM Diagnosis I87.2 Venous insufficiency (chronic) (peripheral) 01/07/2020 No Yes I89.0 Lymphedema, not elsewhere classified 01/07/2020 No Yes L97.522 Non-pressure chronic ulcer of other part of left foot with fat layer 01/07/2020 No Yes exposed L97.312 Non-pressure chronic ulcer of right ankle with fat layer exposed 01/07/2020 No Yes I10 Essential (primary) hypertension 01/07/2020 No Yes I25.10 Atherosclerotic heart disease of native coronary artery without angina 01/07/2020 No Yes pectoris Inactive Problems Resolved Problems Electronic Signature(s) Signed: 01/29/2020 12:47:24 PM  By: Worthy Keeler PA-C Entered By: Worthy Keeler on 01/29/2020 12:47:23 [name] ([id number]) -------------------------------------------------------------------------------- Progress Note Details Patient Name: [name] Date of Service: 01/29/2020 12:30 PM Medical Record Number: [id number] Patient Account Number: [id number] Date of Birth/Sex: [birth year] (73 y.o. M) Treating RN: Montey Hora Primary Care Provider: Alma Friendly Other Clinician: Referring Provider: Alma Friendly Treating Provider/Extender: Melburn Hake, Kenard Morawski Weeks in Treatment: 3 Subjective Chief Complaint Information obtained from Patient Right foot and ankle ulcers History of Present Illness (  HPI) 01/07/2020 patient presents today for initial evaluation in our clinic concerning issues that he has been having with ulcers on his bilateral feet. More specifically at his left foot and his right ankle and on the ankle it is medial and lateral. With that being said these wounds have apparently been present for a significant amount of time in fact the patient states "20 years". When I questioned this and even ask his family member that was with him today she states that the last time she saw them was around 2006 and they were not this bad. Nonetheless I think he has had this for a significant amount of time although I am unsure of the exact locations of wounds and if some have come and gone. He does have chronic venous insufficiency, lymphedema, hypertension, and has had a heart attack in the recent past few months. Subsequently he just moved from [place] to this area to live with family. His hypertension has been very uncontrolled he does see Dr. [name] and was placed on amlodipine 5 mg. With that being said unfortunately this does not seem according to family to have helped much with his blood pressure currently though to be honest it somewhat hard to know for sure as he had not taken the medicine before he came  in today his blood pressure was significantly elevated but apparently it was when he saw his primary on Tuesday as well but then by the time he took his medicine got to the cardiologist this was much lower dropping from around 161 systolic to around 096. Nonetheless my suggestion in this regard is can be for her to keep a track of his blood pressure readings morning noon and night in order to see how this goes throughout the day for the next week and then we will address it following. In regard to the wounds we are going to see what we can do as far as helping with this currently. He also goes to vascular for testing later today. 01/14/2020 upon evaluation today patient's wound actually appears to be doing quite well in general at all locations. He has a lot of new epithelial growth which is great news. Fortunately there is no signs of active infection at this time. He did have his arterial study on the right this was a ABI of 1.44 with a TBI of 1.10 on the left an ABI of 1.46 with a TBI of 0.93. The good news is he has no signs of arterial insufficiency. The patient also did have blood pressure readings that his family member took over the past week morning and evening. Consistently he was much lower in the evening than he was in the morning. They feel like that his amlodipine probably needs to be increased again I explained that I would give there cardiologist, Dr. [name], a call but that that is not something that I would manage here at the wound care center as far as his blood pressure is concerned. Be more than happy to relay that information to Dr. Tyrell Antonio office however. 01/29/2020 upon evaluation today patient appears to be doing well in general with regard to his wounds. With that being said on the medial aspect he does have some purulent drainage noted which is somewhat reminiscent of potential Pseudomonas although there could be something else going on here as well. Nonetheless I think we do  need to obtain a culture also think that antibiotics are probably can be necessary at this point. Objective Constitutional Well-nourished  and well-hydrated in no acute distress. Vitals Time Taken: 12:40 PM, Height: 71 in, Weight: 138 lbs, BMI: 19.2, Temperature: 98.4 F, Pulse: 65 bpm, Respiratory Rate: 16 breaths/min, Blood Pressure: 157/80 mmHg. Respiratory normal breathing without difficulty. Psychiatric this patient is able to make decisions and demonstrates good insight into disease process. Alert and Oriented x 3. pleasant and cooperative. General Notes: Upon inspection patient's wound bed actually showed signs of good granulation at this time. Fortunately there is no evidence of systemic infection although I believe there may be some local cellulitis on the medial wound which is the larger of his wounds. It appears to be a little erythematous and warm to touch as well. I did perform debridement to clear away some of the slough so that I can get a good sample/culture and then subsequently we are going to also place him on an oral antibiotic today as well. Integumentary (Hair, Skin) Wound #1 status is Open. Original cause of wound was Blister. The wound is located on the Left,Lateral Foot. The wound measures 0.1cm length Bickle, Garek ([id number]) x 0.1cm width x 0.1cm depth; 0.008cm^2 area and 0.001cm^3 volume. There is Fat Layer (Subcutaneous Tissue) Exposed exposed. There is a medium amount of serous drainage noted. The wound margin is indistinct and nonvisible. There is no granulation within the wound bed. There is a large (67-100%) amount of necrotic tissue within the wound bed including Adherent Slough. Wound #2 status is Open. Original cause of wound was Gradually Appeared. The wound is located on the Right,Medial Ankle. The wound measures 6.7cm length x 4cm width x 0.3cm depth; 21.049cm^2 area and 6.315cm^3 volume. There is Fat Layer (Subcutaneous Tissue) Exposed exposed. There  is a medium amount of serous drainage noted. The wound margin is thickened. There is large (67-100%) pink granulation within the wound bed. There is a small (1-33%) amount of necrotic tissue within the wound bed including Adherent Slough. Wound #3 status is Open. Original cause of wound was Gradually Appeared. The wound is located on the Right,Lateral Malleolus. The wound measures 1cm length x 1cm width x 0.1cm depth; 0.785cm^2 area and 0.079cm^3 volume. There is Fat Layer (Subcutaneous Tissue) Exposed exposed. There is a medium amount of serous drainage noted. The wound margin is indistinct and nonvisible. There is medium (34-66%) pink granulation within the wound bed. There is a medium (34-66%) amount of necrotic tissue within the wound bed including Adherent Slough. 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 There was a Excisional Skin/Subcutaneous Tissue Debridement with a total area of 26.8 sq cm performed by STONE III, Leslye Puccini E., PA-C. With the following instrument(s): Curette to remove Viable and Non-Viable tissue/material. Material removed includes Subcutaneous Tissue and Slough and after achieving pain control using Lidocaine 4% Topical Solution. 1 specimen was taken by a Swab and sent to the lab per facility protocol. A time out was conducted at 13:14, prior to the start of the procedure. A Minimum amount of bleeding was controlled with Pressure. The procedure was tolerated well with a pain level  of 0 throughout and a pain level of 0 following the procedure. Post  Debridement Measurements: 6.7cm length x 4cm width x 0.4cm depth; 8.419cm^3 volume. Character of Wound/Ulcer Post Debridement is improved. Severity of Tissue Post Debridement is: Fat layer exposed. Post procedure Diagnosis Wound #2: Same as Pre-Procedure Plan Wound Cleansing: Wound #1 Left,Lateral Foot: Clean wound with Normal Saline. Cleanse wound with mild soap and water Wound #2 Right,Medial Ankle: Clean wound with Normal Saline. Cleanse wound with mild soap and water Wound #3 Right,Lateral Malleolus: Clean wound with Normal Saline. Cleanse wound with mild soap and water Primary Wound Dressing: Wound #1 Left,Lateral Foot: Silver Collagen - moisten with saline Secondary Dressing: Wound #1 Left,Lateral Foot: ABD and Kerlix/Conform Wound #2 Right,Medial Ankle: ABD and Kerlix/Conform Wound #3 Right,Lateral Malleolus: ABD and Kerlix/Conform Dressing Change Frequency: Spanos, Chrissie Noa ([id number]) Wound #1 Left,Lateral Foot: Change dressing every other day. Wound #2 Right,Medial Ankle: Change dressing every other day. Wound #3 Right,Lateral Malleolus: Change dressing every other day. Follow-up Appointments: Return Appointment in 1 week. Edema Control: Wound #1 Left,Lateral Foot: Other: - tubi grip F bilateral Wound #2 Right,Medial Ankle: Other: - tubi grip F bilateral Wound #3 Right,Lateral Malleolus: Other: - tubi grip F bilateral Laboratory ordered were: Wound culture routine The following medication(s) was prescribed: Levaquin oral 500 mg tablet 1 1 tablet oral taken 1 time per day for 14 days starting 01/29/2020 1. I would recommend currently that we go ahead and initiate treatment with a continuation of the silver collagen I think that still a good option for him he seems to be doing well with this. 2. I am also going to go ahead and suggest that we start him on Levaquin at this point in order to help  treat a potential Pseudomonas infection this also provides good coverage is for staph infection as well. 3. I am going to suggest that we continue with the dressing changes at home including the Tubigrip which seems to be doing well his family member is doing an excellent job changing his dressings. We will see patient back for reevaluation in 1 week here in the clinic. If anything worsens or changes patient will contact our office for additional recommendations. Electronic Signature(s) Signed: 01/29/2020 1:23:07 PM By: Worthy Keeler PA-C Entered By: Worthy Keeler on 01/29/2020 13:23:07 [name] ([id number]) -------------------------------------------------------------------------------- SuperBill Details Patient Name: [name] Date of Service: 01/29/2020 Medical Record Number: [id number] Patient Account Number: [id number] Date of Birth/Sex: 05-11-1946 (73 y.o. M) Treating RN: Montey Hora Primary Care Provider: Alma Friendly Other Clinician: Referring Provider: Alma Friendly Treating Provider/Extender: Melburn Hake, Dawnna Gritz Weeks in Treatment: 3 Diagnosis Coding ICD-10 Codes Code Description I87.2 Venous insufficiency (chronic) (peripheral) I89.0 Lymphedema, not elsewhere classified L97.522 Non-pressure chronic ulcer of other part of left foot with fat layer exposed L97.312 Non-pressure chronic ulcer of right ankle with fat layer exposed I10 Essential (primary) hypertension I25.10 Atherosclerotic heart disease of native coronary artery without angina pectoris Facility Procedures CPT4 Code: 99242683 Description: 41962 - DEB SUBQ TISSUE 20 SQ CM/< Modifier: Quantity: 1 CPT4 Code: Description: ICD-10 Diagnosis Description L97.312 Non-pressure chronic ulcer of right ankle with fat layer exposed Modifier: Quantity: CPT4 Code: 22979892 Description: 11045 - DEB SUBQ TISS EA ADDL 20CM Modifier: Quantity: 1 CPT4 Code: Description: ICD-10 Diagnosis Description L97.312  Non-pressure chronic ulcer of right ankle with fat layer exposed Modifier: Quantity: Physician Procedures CPT4 Code: 1194174 Description: 99214 - WC PHYS LEVEL 4 - EST PT Modifier: 25 Quantity: 1 CPT4 Code: Description: ICD-10 Diagnosis Description I87.2 Venous insufficiency (chronic) (peripheral) I89.0 Lymphedema, not elsewhere classified L97.522 Non-pressure chronic  ulcer of other part of left foot with fat layer expo L97.312 Non-pressure chronic ulcer of  right ankle with fat layer exposed Modifier: sed Quantity: CPT4 Code: 2010071 Description: 11042 - WC PHYS SUBQ TISS 20 SQ CM Modifier: Quantity: 1 CPT4 Code: Description: ICD-10 Diagnosis Description Q19.758 Non-pressure chronic ulcer of right ankle with fat layer exposed Modifier: Quantity: CPT4 Code: 8325498 Description: 26415 - WC PHYS SUBQ TISS EA ADDL 20 CM Modifier: Quantity: 1 CPT4 Code: Description: ICD-10 Diagnosis Description A30.940 Non-pressure chronic ulcer of right ankle with fat layer exposed Modifier: Quantity: Electronic Signature(s) Signed: 01/29/2020 1:23:48 PM By: Worthy Keeler PA-C Entered By: Worthy Keeler on 01/29/2020 13:23:48

## 2020-01-29 NOTE — Progress Notes (Signed)
FABIEN, TRAVELSTEAD ([id number]) Visit Report for 01/29/2020 Arrival Information Details Patient Name: Michael Chang, Michael Chang Date of Service: 01/29/2020 12:30 PM Medical Record Number: [id number] Patient Account Number: [id number] Date of Birth/Sex: 10/11/1945 (74 y.o. M) Treating RN: Montey Hora Primary Care Michael Chang: Alma Friendly Other Clinician: Referring Christerpher Clos: Alma Friendly Treating Zoua Caporaso/Extender: Melburn Hake, HOYT Weeks in Treatment: 3 Visit Information History Since Last Visit Added or deleted any medications: No Patient Arrived: Wheel Chair Any new allergies or adverse reactions: No Arrival Time: 12:43 Had a fall or experienced change in No Accompanied By: sister activities of daily living that may affect Transfer Assistance: None risk of falls: Patient Identification Verified: Yes Signs or symptoms of abuse/neglect since last visito No Secondary Verification Process Completed: Yes Hospitalized since last visit: No Implantable device outside of the clinic excluding No cellular tissue based products placed in the center since last visit: Has Dressing in Place as Prescribed: Yes Pain Present Now: No Electronic Signature(s) Signed: 01/29/2020 4:14:40 PM By: Lorine Bears RCP, RRT, CHT Entered By: Lorine Bears on 01/29/2020 12:45:38 Michael Chang ([id number]) -------------------------------------------------------------------------------- Lower Extremity Assessment Details Patient Name: Michael Chang Date of Service: 01/29/2020 12:30 PM Medical Record Number: [id number] Patient Account Number: [id number] Date of Birth/Sex: Dec 08, 1945 (74 y.o. M) Treating RN: Army Melia Primary Care Allissa Albright: Alma Friendly Other Clinician: Referring Rilynn Habel: Alma Friendly Treating Khaleem Burchill/Extender: Melburn Hake, HOYT Weeks in Treatment: 3 Edema Assessment Assessed: [Left: No] [Right: No] Edema: [Left: No] [Right: No] Vascular  Assessment Pulses: Dorsalis Pedis Palpable: [Left:Yes] [Right:Yes] Electronic Signature(s) Signed: 01/29/2020 3:34:13 PM By: Army Melia Entered By: Army Melia on 01/29/2020 12:55:40 Michael Chang ([id number]) -------------------------------------------------------------------------------- Multi Wound Chart Details Patient Name: Michael Chang Date of Service: 01/29/2020 12:30 PM Medical Record Number: [id number] Patient Account Number: [id number] Date of Birth/Sex: Jan 15, 1946 (73 y.o. M) Treating RN: Montey Hora Primary Care Sahra Converse: Alma Friendly Other Clinician: Referring Avish Torry: Alma Friendly Treating Armari Fussell/Extender: Melburn Hake, HOYT Weeks in Treatment: 3 Vital Signs Height(in): 71 Pulse(bpm): 43 Weight(lbs): 138 Blood Pressure(mmHg): 157/80 Body Mass Index(BMI): 19 Temperature(F): 98.4 Respiratory Rate(breaths/min): 16 Photos: Wound Location: Left, Lateral Foot Right, Medial Ankle Right, Lateral Malleolus Wounding Event: Blister Gradually Appeared Gradually Appeared Primary Etiology: Venous Leg Ulcer Venous Leg Ulcer Venous Leg Ulcer Comorbid History: Hypertension, Myocardial Infarction, Hypertension, Myocardial Infarction, Hypertension, Myocardial Infarction, Peripheral Venous Disease, End Peripheral Venous Disease, End Peripheral Venous Disease, End Stage Renal Disease Stage Renal Disease Stage Renal Disease Date Acquired: 01/07/2019 01/07/2019 01/07/2019 Weeks of Treatment: 3 3 3  Wound Status: Open Open Open Measurements L x W x D (cm) 0.1x0.1x0.1 6.7x4x0.3 1x1x0.1 Area (cm) : 0.008 21.049 0.785 Volume (cm) : 0.001 6.315 0.079 % Reduction in Area: 99.30% 35.60% -4.10% % Reduction in Volume: 99.10% 51.70% -5.30% Classification: Full Thickness Without Exposed Full Thickness Without Exposed Full Thickness Without Exposed Support Structures Support Structures Support Structures Exudate Amount: Medium Medium Medium Exudate Type: Serous Serous  Serous Exudate Color: Geophysical data processor Wound Margin: Indistinct, nonvisible Thickened Indistinct, nonvisible Granulation Amount: None Present (0%) Large (67-100%) Medium (34-66%) Granulation Quality: N/A Pink Pink Necrotic Amount: Large (67-100%) Small (1-33%) Medium (34-66%) Exposed Structures: Fat Layer (Subcutaneous Tissue) Fat Layer (Subcutaneous Tissue) Fat Layer (Subcutaneous Tissue) Exposed: Yes Exposed: Yes Exposed: Yes Fascia: No Fascia: No Fascia: No Tendon: No Tendon: No Tendon: No Muscle: No Muscle: No Muscle: No Joint: No Joint: No Joint: No Bone: No Bone: No Bone: No Epithelialization: None None None Treatment Notes Electronic Signature(s) Signed: 01/29/2020 4:19:26 PM By: Montey Hora Entered By: Marjory Lies,  Di Kindle on 01/29/2020 13:11:22 Michael Chang, Michael Chang ([id number]) -------------------------------------------------------------------------------- [place] Details Patient Name: Michael Chang, Michael Chang Date of Service: 01/29/2020 12:30 PM Medical Record Number: [id number] Patient Account Number: [id number] Date of Birth/Sex: [birth year] (74 y.o. M) Treating RN: Montey Hora Primary Care Briona Korpela: Alma Friendly Other Clinician: Referring Psalm Schappell: Alma Friendly Treating Jedidiah Demartini/Extender: Melburn Hake, HOYT Weeks in Treatment: 3 Active Inactive Orientation to the Wound Care Program Nursing Diagnoses: Knowledge deficit related to the wound healing center program Goals: Patient/caregiver will verbalize understanding of the [place] Program Date Initiated: 01/08/2020 Target Resolution Date: 02/05/2020 Goal Status: Active Interventions: Provide education on orientation to the wound center Notes: Venous Leg Ulcer Nursing Diagnoses: Potential for venous Insuffiency (use before diagnosis confirmed) Goals: Patient/caregiver will verbalize understanding of disease process and disease management Date Initiated: 01/08/2020 Target Resolution  Date: 02/05/2020 Goal Status: Active Interventions: Assess peripheral edema status every visit. Notes: Wound/Skin Impairment Nursing Diagnoses: Impaired tissue integrity Goals: Ulcer/skin breakdown will have a volume reduction of 30% by week 4 Date Initiated: 01/08/2020 Target Resolution Date: 02/05/2020 Goal Status: Active Interventions: Assess ulceration(s) every visit Notes: Electronic Signature(s) Signed: 01/29/2020 4:19:26 PM By: Montey Hora Entered By: Montey Hora on 01/29/2020 13:11:14 Michael Chang ([id number]) -------------------------------------------------------------------------------- Pain Assessment Details Patient Name: Michael Chang Date of Service: 01/29/2020 12:30 PM Medical Record Number: [id number] Patient Account Number: [id number] Date of Birth/Sex: 1946-05-04 (74 y.o. M) Treating RN: Army Melia Primary Care Nicola Heinemann: Alma Friendly Other Clinician: Referring Slayde Brault: Alma Friendly Treating Dilara Navarrete/Extender: Melburn Hake, HOYT Weeks in Treatment: 3 Active Problems Location of Pain Severity and Description of Pain Patient Has Paino No Site Locations Pain Management and Medication Current Pain Management: Electronic Signature(s) Signed: 01/29/2020 3:34:13 PM By: Army Melia Entered By: Army Melia on 01/29/2020 12:51:11 Michael Chang ([id number]) -------------------------------------------------------------------------------- Patient/Caregiver Education Details Patient Name: Michael Chang Date of Service: 01/29/2020 12:30 PM Medical Record Number: [id number] Patient Account Number: [id number] Date of Birth/Gender: 1946/01/09 (73 y.o. M) Treating RN: Montey Hora Primary Care Physician: Alma Friendly Other Clinician: Referring Physician: Alma Friendly Treating Physician/Extender: Sharalyn Ink in Treatment: 3 Education Assessment Education Provided To: Patient and Caregiver Education Topics Provided Wound/Skin  Impairment: Handouts: Other: wound care as ordered Methods: Demonstration, Explain/Verbal Responses: State content correctly Electronic Signature(s) Signed: 01/29/2020 4:19:26 PM By: Montey Hora Entered By: Montey Hora on 01/29/2020 13:31:48 Michael Chang ([id number]) -------------------------------------------------------------------------------- Wound Assessment Details Patient Name: Michael Chang Date of Service: 01/29/2020 12:30 PM Medical Record Number: [id number] Patient Account Number: [id number] Date of Birth/Sex: 08/17/1946 (73 y.o. M) Treating RN: Army Melia Primary Care Victoriya Pol: Alma Friendly Other Clinician: Referring Maxyne Derocher: Alma Friendly Treating Gwyn Hieronymus/Extender: Melburn Hake, HOYT Weeks in Treatment: 3 Wound Status Wound Number: 1 Primary Venous Leg Ulcer Etiology: Wound Location: Left, Lateral Foot Wound Open Wounding Event: Blister Status: Date Acquired: 01/07/2019 Comorbid Hypertension, Myocardial Infarction, Peripheral Venous Weeks Of Treatment: 3 History: Disease, End Stage Renal Disease Clustered Wound: No Photos Wound Measurements Length: (cm) 0.1 Width: (cm) 0.1 Depth: (cm) 0.1 Area: (cm) 0.008 Volume: (cm) 0.001 % Reduction in Area: 99.3% % Reduction in Volume: 99.1% Epithelialization: None Wound Description Classification: Full Thickness Without Exposed Support Structu Wound Margin: Indistinct, nonvisible Exudate Amount: Medium Exudate Type: Serous Exudate Color: amber res Foul Odor After Cleansing: No Slough/Fibrino Yes Wound Bed Granulation Amount: None Present (0%) Exposed Structure Necrotic Amount: Large (67-100%) Fascia Exposed: No Necrotic Quality: Adherent Slough Fat Layer (Subcutaneous Tissue) Exposed: Yes Tendon Exposed: No Muscle Exposed: No Joint Exposed: No Bone Exposed: No Treatment Notes Wound #  1 (Left, Lateral Foot) Notes collagen, ABD, conform, tubi Electronic Signature(s) Signed: 01/29/2020 3:34:13  PM By: Vinnie Langton ([id number]) Entered By: Army Melia on 01/29/2020 12:54:49 Michael Chang, Michael Chang ([id number]) -------------------------------------------------------------------------------- Wound Assessment Details Patient Name: Michael Chang Date of Service: 01/29/2020 12:30 PM Medical Record Number: [id number] Patient Account Number: [id number] Date of Birth/Sex: [birth year] (73 y.o. M) Treating RN: Army Melia Primary Care Roshan Roback: Alma Friendly Other Clinician: Referring Michael Chang: Alma Friendly Treating Jamaria Amborn/Extender: Melburn Hake, HOYT Weeks in Treatment: 3 Wound Status Wound Number: 2 Primary Venous Leg Ulcer Etiology: Wound Location: Right, Medial Ankle Wound Open Wounding Event: Gradually Appeared Status: Date Acquired: 01/07/2019 Comorbid Hypertension, Myocardial Infarction, Peripheral Venous Weeks Of Treatment: 3 History: Disease, End Stage Renal Disease Clustered Wound: No Photos Wound Measurements Length: (cm) 6.7 Width: (cm) 4 Depth: (cm) 0.3 Area: (cm) 21.049 Volume: (cm) 6.315 % Reduction in Area: 35.6% % Reduction in Volume: 51.7% Epithelialization: None Wound Description Classification: Full Thickness Without Exposed Support Structu Wound Margin: Thickened Exudate Amount: Medium Exudate Type: Serous Exudate Color: amber res Foul Odor After Cleansing: No Slough/Fibrino Yes Wound Bed Granulation Amount: Large (67-100%) Exposed Structure Granulation Quality: Pink Fascia Exposed: No Necrotic Amount: Small (1-33%) Fat Layer (Subcutaneous Tissue) Exposed: Yes Necrotic Quality: Adherent Slough Tendon Exposed: No Muscle Exposed: No Joint Exposed: No Bone Exposed: No Treatment Notes Wound #2 (Right, Medial Ankle) Notes collagen, ABD, conform, tubi Electronic Signature(s) Signed: 01/29/2020 3:34:13 PM By: Vinnie Langton ([id number]) Entered By: Army Melia on 01/29/2020 12:55:06 Michael Chang  ([id number]) -------------------------------------------------------------------------------- Wound Assessment Details Patient Name: Michael Chang Date of Service: 01/29/2020 12:30 PM Medical Record Number: [id number] Patient Account Number: [id number] Date of Birth/Sex: 15-Nov-1945 (74 y.o. M) Treating RN: Army Melia Primary Care Bernisha Verma: Alma Friendly Other Clinician: Referring Elleana Stillson: Alma Friendly Treating Dajha Urquilla/Extender: Melburn Hake, HOYT Weeks in Treatment: 3 Wound Status Wound Number: 3 Primary Venous Leg Ulcer Etiology: Wound Location: Right, Lateral Malleolus Wound Open Wounding Event: Gradually Appeared Status: Date Acquired: 01/07/2019 Comorbid Hypertension, Myocardial Infarction, Peripheral Venous Weeks Of Treatment: 3 History: Disease, End Stage Renal Disease Clustered Wound: No Photos Wound Measurements Length: (cm) 1 Width: (cm) 1 Depth: (cm) 0.1 Area: (cm) 0.785 Volume: (cm) 0.079 % Reduction in Area: -4.1% % Reduction in Volume: -5.3% Epithelialization: None Wound Description Classification: Full Thickness Without Exposed Support Structures Wound Margin: Indistinct, nonvisible Exudate Amount: Medium Exudate Type: Serous Exudate Color: amber Foul Odor After Cleansing: No Slough/Fibrino Yes Wound Bed Granulation Amount: Medium (34-66%) Exposed Structure Granulation Quality: Pink Fascia Exposed: No Necrotic Amount: Medium (34-66%) Fat Layer (Subcutaneous Tissue) Exposed: Yes Necrotic Quality: Adherent Slough Tendon Exposed: No Muscle Exposed: No Joint Exposed: No Bone Exposed: No Treatment Notes Wound #3 (Right, Lateral Malleolus) Notes collagen, ABD, conform, tubi Electronic Signature(s) Signed: 01/29/2020 3:34:13 PM By: Vinnie Langton ([id number]) Entered By: Army Melia on 01/29/2020 12:55:22 Michael Chang ([id number]) -------------------------------------------------------------------------------- Vitals  Details Patient Name: Michael Chang Date of Service: 01/29/2020 12:30 PM Medical Record Number: [id number] Patient Account Number: [id number] Date of Birth/Sex: 12/18/1945 (74 y.o. M) Treating RN: Montey Hora Primary Care Depaul Arizpe: Alma Friendly Other Clinician: Referring Elizabeht Suto: Alma Friendly Treating Simrah Chatham/Extender: Melburn Hake, HOYT Weeks in Treatment: 3 Vital Signs Time Taken: 12:40 Temperature (F): 98.4 Height (in): 71 Pulse (bpm): 65 Weight (lbs): 138 Respiratory Rate (breaths/min): 16 Body Mass Index (BMI): 19.2 Blood Pressure (mmHg): 157/80 Reference Range: 80 - 120 mg / dl Electronic Signature(s) Signed: 01/29/2020 4:14:40 PM By: Lorine Bears RCP, RRT, CHT Entered By: Juleen China,  RCP,RRT,CHT, Sallie on 01/29/2020 12:46:03

## 2020-02-01 ENCOUNTER — Other Ambulatory Visit: Payer: Self-pay

## 2020-02-01 ENCOUNTER — Ambulatory Visit (INDEPENDENT_AMBULATORY_CARE_PROVIDER_SITE_OTHER): Payer: Medicare Other | Admitting: Primary Care

## 2020-02-01 ENCOUNTER — Encounter: Payer: Self-pay | Admitting: Primary Care

## 2020-02-01 DIAGNOSIS — I5022 Chronic systolic (congestive) heart failure: Secondary | ICD-10-CM | POA: Diagnosis not present

## 2020-02-01 DIAGNOSIS — I25118 Atherosclerotic heart disease of native coronary artery with other forms of angina pectoris: Secondary | ICD-10-CM | POA: Diagnosis not present

## 2020-02-01 DIAGNOSIS — L97529 Non-pressure chronic ulcer of other part of left foot with unspecified severity: Secondary | ICD-10-CM

## 2020-02-01 DIAGNOSIS — L97912 Non-pressure chronic ulcer of unspecified part of right lower leg with fat layer exposed: Secondary | ICD-10-CM | POA: Diagnosis not present

## 2020-02-01 DIAGNOSIS — I10 Essential (primary) hypertension: Secondary | ICD-10-CM | POA: Diagnosis not present

## 2020-02-01 DIAGNOSIS — R7989 Other specified abnormal findings of blood chemistry: Secondary | ICD-10-CM

## 2020-02-01 DIAGNOSIS — L97519 Non-pressure chronic ulcer of other part of right foot with unspecified severity: Secondary | ICD-10-CM

## 2020-02-01 LAB — AEROBIC CULTURE W GRAM STAIN (SUPERFICIAL SPECIMEN)

## 2020-02-01 NOTE — Progress Notes (Signed)
Subjective:    Patient ID: Michael Chang, male    DOB: Jan 31, 1946, 74 y.o.   MRN: [id number]  HPI  This visit occurred during the SARS-CoV-2 public health emergency.  Safety protocols were in place, including screening questions prior to the visit, additional usage of staff PPE, and extensive cleaning of exam room while observing appropriate contact time as indicated for disinfecting solutions.   Michael Chang is a 74 year old male with a history of CHF, PVD, CAD, hypertension, CKD, chronic lower extremity ulcer who presents today with his sister to follow up regarding blood pressure.  He is currently managed on isosorbide mononitrate 30 mg, amlodipine 5 mg BID, carvedilol 12.5 mg BID, furosemide 40 mg, hydralazine 100 mg BID. Following with cardiology, last visit being in April 2021, amlodipine 5 mg was added and then increased to 5 mg BID shortly after.   His sister is checking his BP two to three times daily at home which is running 120's-190's/60's-low 100's. Evening readings are mostly in the 120's-150's/60's-70's. Last night his BP was 115/59 with symptoms of dizziness. The patient isn't taking furosemide as it's causing increased urinary frequency.   He is following with the wound center for chronic ulcers and has noticed significant improvement. He denies chest pain, shortness of breath, nausea.   BP Readings from Last 3 Encounters:  02/01/20 (!) 168/100  01/07/20 (!) 144/75  01/05/20 140/80     Review of Systems  Respiratory: Negative for shortness of breath.   Cardiovascular: Negative for chest pain, palpitations and leg swelling.  Neurological:       Dizziness last night       Past Medical History:  Diagnosis Date  . AKI (acute kidney injury) ([place])   . CHF (congestive heart failure) ([place])   . Chronic ulcer of right lower extremity ([place])   . CKD (chronic kidney disease)   . NSTEMI (non-ST elevated myocardial infarction) ([place])   . PVD (peripheral vascular disease) ([place])       Social History   Socioeconomic History  . Marital status: Single    Spouse name: Not on file  . Number of children: Not on file  . Years of education: Not on file  . Highest education level: Not on file  Occupational History  . Not on file  Tobacco Use  . Smoking status: Never Smoker  . Smokeless tobacco: Never Used  Substance and Sexual Activity  . Alcohol use: Not Currently  . Drug use: Not on file  . Sexual activity: Not on file  Other Topics Concern  . Not on file  Social History Narrative  . Not on file   Social Determinants of Health   Financial Resource Strain:   . Difficulty of Paying Living Expenses:   Food Insecurity:   . Worried About Charity fundraiser in the Last Year:   . Arboriculturist in the Last Year:   Transportation Needs:   . Film/video editor (Medical):   [name] Lack of Transportation (Non-Medical):   Physical Activity:   . Days of Exercise per Week:   . Minutes of Exercise per Session:   Stress:   . Feeling of Stress :   Social Connections:   . Frequency of Communication with Friends and Family:   . Frequency of Social Gatherings with Friends and Family:   . Attends Religious Services:   . Active Member of Clubs or Organizations:   . Attends Archivist Meetings:   .  Marital Status:   Intimate Partner Violence:   . Fear of Current or Ex-Partner:   . Emotionally Abused:   [name] Physically Abused:   . Sexually Abused:     No past surgical history on file.  No family history on file.  No Known Allergies  Current Outpatient Medications on File Prior to Visit  Medication Sig Dispense Refill  . amLODipine (NORVASC) 5 MG tablet Take 1 tablet (5 mg total) by mouth in the morning and at bedtime. 60 tablet 5  . aspirin EC 81 MG tablet Take 1 tablet (81 mg total) by mouth daily.    . carvedilol (COREG) 12.5 MG tablet Take 1 tablet (12.5 mg total) by mouth 2 (two) times daily with a meal. 180 tablet 1  . furosemide (LASIX) 40 MG  tablet Take 1 tablet (40 mg total) by mouth daily. 90 tablet 1  . hydrALAZINE (APRESOLINE) 100 MG tablet Take 1 tablet (100 mg total) by mouth 2 (two) times daily. 180 tablet 1  . isosorbide mononitrate (IMDUR) 30 MG 24 hr tablet Take 1 tablet (30 mg total) by mouth daily. 90 tablet 1   No current facility-administered medications on file prior to visit.    BP (!) 168/100   Pulse (!) 51   Temp (!) 96.4 F (35.8 C) (Temporal)   Ht 5\' 11"  (1.803 m)   Wt 139 lb 8 oz (63.3 kg)   SpO2 98%   BMI 19.46 kg/m    Objective:   Physical Exam  Constitutional: He appears well-nourished.  Cardiovascular: Normal rate and regular rhythm.  Respiratory: Effort normal and breath sounds normal.  Musculoskeletal:     Cervical back: Neck supple.  Skin: Skin is warm and dry.  Psychiatric: He has a normal mood and affect.           Assessment & Plan:

## 2020-02-01 NOTE — Assessment & Plan Note (Signed)
Asymptomatic.  Continue to work on BP control. Continue carvedilol.

## 2020-02-01 NOTE — Assessment & Plan Note (Signed)
Following with cardiology, not taking furosemide due to urinary frequency. Appears euvolemic today.

## 2020-02-01 NOTE — Assessment & Plan Note (Addendum)
Home readings all over the place with home machine. Home machine check today with reading of 195/96 and our manual reading of 200/100.   Home evening readings are improved overall compared to morning readings, but it does seem like their home machine is accurate. Continue current medication regimen today without changes. Will update cardiology.

## 2020-02-01 NOTE — Assessment & Plan Note (Signed)
Improving with treatment at wound center. Continue same.

## 2020-02-01 NOTE — Patient Instructions (Signed)
Continue taking your blood pressure medications as prescribed.  I will speak with Dr. [name] regarding our visit today.  It was a pleasure to see you today!

## 2020-02-01 NOTE — Assessment & Plan Note (Signed)
TSH within normal range from last visit.

## 2020-02-01 NOTE — Assessment & Plan Note (Signed)
Improving with treatment at the wound center, continue same.

## 2020-02-04 ENCOUNTER — Telehealth: Payer: Self-pay | Admitting: Primary Care

## 2020-02-04 DIAGNOSIS — I10 Essential (primary) hypertension: Secondary | ICD-10-CM

## 2020-02-04 MED ORDER — ISOSORBIDE MONONITRATE ER 60 MG PO TB24: 60.0000 mg | ORAL_TABLET | Freq: Every day | ORAL | 0 refills | Status: DC

## 2020-02-04 NOTE — Telephone Encounter (Signed)
Please notify patient and his daughter that I spoke with Dr. [name] who recommends we increase his isosorbide mononitrate to 60 mg. He is currently taking 30 mg. I will send a prescription for the 60 mg dose.   Have his sister monitor BP and notify us if she continues to get readings at or above 140/90.

## 2020-02-04 NOTE — Telephone Encounter (Signed)
Spoken and notified patient's sister of Tawni Millers comments. Patient's sister verbalized understanding.

## 2020-02-05 ENCOUNTER — Other Ambulatory Visit: Payer: Self-pay

## 2020-02-05 ENCOUNTER — Encounter: Payer: Medicare Other | Attending: Physician Assistant | Admitting: Physician Assistant

## 2020-02-05 DIAGNOSIS — I872 Venous insufficiency (chronic) (peripheral): Secondary | ICD-10-CM | POA: Diagnosis not present

## 2020-02-05 DIAGNOSIS — I2510 Atherosclerotic heart disease of native coronary artery without angina pectoris: Secondary | ICD-10-CM | POA: Diagnosis not present

## 2020-02-05 DIAGNOSIS — L97312 Non-pressure chronic ulcer of right ankle with fat layer exposed: Secondary | ICD-10-CM | POA: Diagnosis not present

## 2020-02-05 DIAGNOSIS — I890 Lymphedema, not elsewhere classified: Secondary | ICD-10-CM | POA: Insufficient documentation

## 2020-02-05 DIAGNOSIS — I252 Old myocardial infarction: Secondary | ICD-10-CM | POA: Diagnosis not present

## 2020-02-05 DIAGNOSIS — I739 Peripheral vascular disease, unspecified: Secondary | ICD-10-CM | POA: Diagnosis not present

## 2020-02-05 DIAGNOSIS — L97522 Non-pressure chronic ulcer of other part of left foot with fat layer exposed: Secondary | ICD-10-CM | POA: Insufficient documentation

## 2020-02-05 DIAGNOSIS — N186 End stage renal disease: Secondary | ICD-10-CM | POA: Diagnosis not present

## 2020-02-05 DIAGNOSIS — I120 Hypertensive chronic kidney disease with stage 5 chronic kidney disease or end stage renal disease: Secondary | ICD-10-CM | POA: Insufficient documentation

## 2020-02-05 NOTE — Progress Notes (Addendum)
MACARIO, SHEAR ([id number]) Visit Report for 02/05/2020 Chief Complaint Document Details Patient Name: Michael Chang, Michael Chang Date of Service: 02/05/2020 1:30 PM Medical Record Number: [id number] Patient Account Number: [id number] Date of Birth/Sex: Mar 12, 1946 (74 y.o. M) Treating RN: Montey Hora Primary Care Provider: Alma Friendly Other Clinician: Referring Provider: Alma Friendly Treating Provider/Extender: Melburn Hake, Zakyria Metzinger Weeks in Treatment: 4 Information Obtained from: Patient Chief Complaint Right foot and ankle ulcers Electronic Signature(s) Signed: 02/05/2020 1:34:25 PM By: Worthy Keeler PA-C Entered By: Worthy Keeler on 02/05/2020 13:34:25 Michael Chang ([id number]) -------------------------------------------------------------------------------- HPI Details Patient Name: Michael Chang Date of Service: 02/05/2020 1:30 PM Medical Record Number: [id number] Patient Account Number: [id number] Date of Birth/Sex: 1946/04/07 (74 y.o. M) Treating RN: Montey Hora Primary Care Provider: Alma Friendly Other Clinician: Referring Provider: Alma Friendly Treating Provider/Extender: Melburn Hake, Gretna Bergin Weeks in Treatment: 4 History of Present Illness HPI Description: 01/07/2020 patient presents today for initial evaluation in our clinic concerning issues that he has been having with ulcers on his bilateral feet. More specifically at his left foot and his right ankle and on the ankle it is medial and lateral. With that being said these wounds have apparently been present for a significant amount of time in fact the patient states "20 years". When I questioned this and even ask his family member that was with him today she states that the last time she saw them was around 2006 and they were not this bad. Nonetheless I think he has had this for a significant amount of time although I am unsure of the exact locations of wounds and if some have come and gone. He does have chronic venous  insufficiency, lymphedema, hypertension, and has had a heart attack in the recent past few months. Subsequently he just moved from [place] to this area to live with family. His hypertension has been very uncontrolled he does see Dr. [name] and was placed on amlodipine 5 mg. With that being said unfortunately this does not seem according to family to have helped much with his blood pressure currently though to be honest it somewhat hard to know for sure as he had not taken the medicine before he came in today his blood pressure was significantly elevated but apparently it was when he saw his primary on Tuesday as well but then by the time he took his medicine got to the cardiologist this was much lower dropping from around 024 systolic to around 097. Nonetheless my suggestion in this regard is can be for her to keep a track of his blood pressure readings morning noon and night in order to see how this goes throughout the day for the next week and then we will address it following. In regard to the wounds we are going to see what we can do as far as helping with this currently. He also goes to vascular for testing later today. 01/14/2020 upon evaluation today patient's wound actually appears to be doing quite well in general at all locations. He has a lot of new epithelial growth which is great news. Fortunately there is no signs of active infection at this time. He did have his arterial study on the right this was a ABI of 1.44 with a TBI of 1.10 on the left an ABI of 1.46 with a TBI of 0.93. The good news is he has no signs of arterial insufficiency. The patient also did have blood pressure readings that his family member took over the past week morning and evening. Consistently  he was much lower in the evening than he was in the morning. They feel like that his amlodipine probably needs to be increased again I explained that I would give there cardiologist, Dr. [name], a call but that that is not  something that I would manage here at the wound care center as far as his blood pressure is concerned. Be more than happy to relay that information to Dr. Tyrell Antonio office however. 01/29/2020 upon evaluation today patient appears to be doing well in general with regard to his wounds. With that being said on the medial aspect he does have some purulent drainage noted which is somewhat reminiscent of potential Pseudomonas although there could be something else going on here as well. Nonetheless I think we do need to obtain a culture also think that antibiotics are probably can be necessary at this point. 02/05/2020 upon evaluation today patient appears to be doing excellent in regard to his wounds. In fact 2 wounds are completely healed as of today just the remaining right medial ankle ulcer is still open but appears to be doing much better there is no evidence of infection today he has been on the antibiotic, Levaquin, that I prescribed for him last week which was appropriate for the infection which showed positive for Morganella and Staphylococcus. Electronic Signature(s) Signed: 02/05/2020 2:00:11 PM By: Worthy Keeler PA-C Entered By: Worthy Keeler on 02/05/2020 14:00:11 Michael Chang, Michael Chang ([id number]) -------------------------------------------------------------------------------- Physical Exam Details Patient Name: Michael Chang Date of Service: 02/05/2020 1:30 PM Medical Record Number: [id number] Patient Account Number: [id number] Date of Birth/Sex: Aug 28, 1946 (74 y.o. M) Treating RN: Montey Hora Primary Care Provider: Alma Friendly Other Clinician: Referring Provider: Alma Friendly Treating Provider/Extender: Melburn Hake, Reign Bartnick Weeks in Treatment: 4 Constitutional Well-nourished and well-hydrated in no acute distress. Respiratory normal breathing without difficulty. Psychiatric this patient is able to make decisions and demonstrates good insight into disease process. Alert and Oriented  x 3. pleasant and cooperative. Notes On inspection again patient's wounds on his feet laterally appear to be completely healed which is great news the medial ankle ulcer on the right is still open but appears to be doing much better there is no evidence of infection at this point. I am very pleased in this regard. Electronic Signature(s) Signed: 02/05/2020 2:00:26 PM By: Worthy Keeler PA-C Entered By: Worthy Keeler on 02/05/2020 14:00:26 Michael Chang ([id number]) -------------------------------------------------------------------------------- Physician Orders Details Patient Name: Michael Chang Date of Service: 02/05/2020 1:30 PM Medical Record Number: [id number] Patient Account Number: [id number] Date of Birth/Sex: 02-Feb-1946 (73 y.o. 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 o Cleanse wound with mild soap and water Primary Wound Dressing Wound #2 Right,Medial Ankle o Silver Collagen Secondary Dressing Wound #2 Right,Medial Ankle o ABD and Kerlix/Conform Dressing Change Frequency Wound #2 Right,Medial Ankle o Change dressing every other day. Follow-up Appointments o Return Appointment in 1 week. Edema Control Wound #2 Right,Medial Ankle o Other: - tubi grip F bilateral Electronic  Signature(s)  Signed: 02/05/2020 3:24:15 PM By: Montey Hora Signed: 02/05/2020 4:02:49 PM By: Worthy Keeler PA-C Entered By: Montey Hora on 02/05/2020 13:57:32 Michael Chang, Michael Chang ([id number]) -------------------------------------------------------------------------------- Problem List Details Patient Name: Michael Chang Date of Service: 02/05/2020 1:30 PM Medical Record Number: [id number] Patient Account Number: [id number] Date of Birth/Sex: 12/05/1945 (73 y.o. M) Treating RN: Montey Hora Primary Care Provider: Alma Friendly Other Clinician: Referring Provider: Alma Friendly Treating Provider/Extender: Melburn Hake, Pooja Camuso Weeks in Treatment: 4 Active Problems ICD-10 Encounter Code Description Active Date MDM Diagnosis I87.2 Venous insufficiency (chronic) (peripheral) 01/07/2020 No Yes I89.0 Lymphedema, not elsewhere classified 01/07/2020 No Yes L97.522 Non-pressure chronic ulcer of other part of left foot with fat layer 01/07/2020 No Yes exposed L97.312 Non-pressure chronic ulcer of right ankle with fat layer exposed 01/07/2020 No Yes I10 Essential (primary) hypertension 01/07/2020 No Yes I25.10 Atherosclerotic heart disease of native coronary artery without angina 01/07/2020 No Yes pectoris Inactive Problems Resolved Problems Electronic Signature(s) Signed: 02/05/2020 1:34:13 PM By: Worthy Keeler PA-C Entered By: Worthy Keeler on 02/05/2020 13:34:12 Michael Chang ([id number]) -------------------------------------------------------------------------------- Progress Note Details Patient Name: Michael Chang Date of Service: 02/05/2020 1:30 PM Medical Record Number: [id number] Patient Account Number: [id number] Date of Birth/Sex: October 22, 1945 (73 y.o. M) Treating RN: Montey Hora Primary Care Provider: Alma Friendly Other Clinician: Referring Provider: Alma Friendly Treating Provider/Extender: Melburn Hake, Kandas Oliveto Weeks in Treatment: 4 Subjective Chief Complaint Information  obtained from Patient Right foot and ankle ulcers History of Present Illness (HPI) 01/07/2020 patient presents today for initial evaluation in our clinic concerning issues that he has been having with ulcers on his bilateral feet. More specifically at his left foot and his right ankle and on the ankle it is medial and lateral. With that being said these wounds have apparently been present for a significant amount of time in fact the patient states "20 years". When I questioned this and even ask his family member that was with him today she states that the last time she saw them was around 2006 and they were not this bad. Nonetheless I think he has had this for a significant amount of time although I am unsure of the exact locations of wounds and if some have come and gone. He does have chronic venous insufficiency, lymphedema, hypertension, and has had a heart attack in the recent past few months. Subsequently he just moved from [place] to this area to live with family. His hypertension has been very uncontrolled he does see Dr. [name] and was placed on amlodipine 5 mg. With that being said unfortunately this does not seem according to family to have helped much with his blood pressure currently though to be honest it somewhat hard to know for sure as he had not taken the medicine before he came in today his blood pressure was significantly elevated but apparently it was when he saw his primary on Tuesday as well but then by the time he took his medicine got to the cardiologist this was much lower dropping from around 767 systolic to around 209. Nonetheless my suggestion in this regard is can be for her to keep a track of his blood pressure readings morning noon and night in order to see how this goes throughout the day for the next week and then we will address it following. In regard to the wounds we are going to see what we can do as far as helping with this currently. He also goes to vascular for  testing later today. 01/14/2020 upon  evaluation today patient's wound actually appears to be doing quite well in general at all locations. He has a lot of new epithelial growth which is great news. Fortunately there is no signs of active infection at this time. He did have his arterial study on the right this was a ABI of 1.44 with a TBI of 1.10 on the left an ABI of 1.46 with a TBI of 0.93. The good news is he has no signs of arterial insufficiency. The patient also did have blood pressure readings that his family member took over the past week morning and evening. Consistently he was much lower in the evening than he was in the morning. They feel like that his amlodipine probably needs to be increased again I explained that I would give there cardiologist, Dr. [name], a call but that that is not something that I would manage here at the wound care center as far as his blood pressure is concerned. Be more than happy to relay that information to Dr. Tyrell Antonio office however. 01/29/2020 upon evaluation today patient appears to be doing well in general with regard to his wounds. With that being said on the medial aspect he does have some purulent drainage noted which is somewhat reminiscent of potential Pseudomonas although there could be something else going on here as well. Nonetheless I think we do need to obtain a culture also think that antibiotics are probably can be necessary at this point. 02/05/2020 upon evaluation today patient appears to be doing excellent in regard to his wounds. In fact 2 wounds are completely healed as of today just the remaining right medial ankle ulcer is still open but appears to be doing much better there is no evidence of infection today he has been on the antibiotic, Levaquin, that I prescribed for him last week which was appropriate for the infection which showed positive for Morganella and Staphylococcus. Objective Constitutional Well-nourished and well-hydrated in no  acute distress. Vitals Time Taken: 1:38 PM, Height: 71 in, Weight: 138 lbs, BMI: 19.2, Temperature: 98.0 F, Pulse: 64 bpm, Respiratory Rate: 16 breaths/min, Blood Pressure: 180/82 mmHg. Respiratory normal breathing without difficulty. Psychiatric this patient is able to make decisions and demonstrates good insight into disease process. Alert and Oriented x 3. pleasant and cooperative. General Notes: On inspection again patient's wounds on his feet laterally appear to be completely healed which is great news the medial ankle ulcer on the right is still open but appears to be doing much better there is no evidence of infection at this point. I am very pleased in this regard. Michael Chang, Michael Chang ([id number]) Integumentary (Hair, Skin) Wound #1 status is Healed - Epithelialized. Original cause of wound was Blister. The wound is located on the Left,Lateral Foot. The wound measures 0cm length x 0cm width x 0cm depth; 0cm^2 area and 0cm^3 volume. There is Fat Layer (Subcutaneous Tissue) Exposed exposed. There is a medium amount of serous drainage noted. The wound margin is indistinct and nonvisible. There is no granulation within the wound bed. There is a large (67-100%) amount of necrotic tissue within the wound bed including Adherent Slough. Wound #2 status is Open. Original cause of wound was Gradually Appeared. The wound is located on the Right,Medial Ankle. The wound measures 5.7cm length x 3.5cm width x 0.3cm depth; 15.669cm^2 area and 4.701cm^3 volume. There is Fat Layer (Subcutaneous Tissue) Exposed exposed. There is a medium amount of serous drainage noted. The wound margin is thickened. There is large (67-100%) pink granulation within the  wound bed. There is a small (1-33%) amount of necrotic tissue within the wound bed including Adherent Slough. Wound #3 status is Healed - Epithelialized. Original cause of wound was Gradually Appeared. The wound is located on the Right,Lateral Malleolus. The  wound measures 0cm length x 0cm width x 0cm depth; 0cm^2 area and 0cm^3 volume. There is Fat Layer (Subcutaneous Tissue) Exposed exposed. There is a medium amount of serous drainage noted. The wound margin is indistinct and nonvisible. There is medium (34-66%) pink granulation within the wound bed. There is a medium (34-66%) amount of necrotic tissue within the wound bed including Adherent Slough. 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 Cleanse wound with mild soap and water Primary Wound Dressing: Wound #2 Right,Medial Ankle: Silver Collagen Secondary Dressing: Wound #2 Right,Medial Ankle: ABD and Kerlix/Conform Dressing Change Frequency: Wound #2 Right,Medial Ankle: Change dressing every other day. Follow-up Appointments: Return Appointment in 1 week. Edema Control: Wound #2 Right,Medial Ankle: Other: - tubi grip F bilateral 1. I would recommend currently that we go ahead and initiate a continuation of therapy with Levaquin over the next week he will complete the previous course. 2. I am also going to recommend that we continue with the collagen for the right medial ankle ulcer which is the 1 remaining open area at this point. The patient seems to be doing very well in this regard. 3. Also recommend he continue to clean the area with warm soap and water or saline with each dressing change. His family member is doing this for him she is taking excellent care of him in my opinion. We are still using the Tubigrip as well. We will see patient back for reevaluation in 1 week  here in the clinic. If anything worsens or changes patient will contact our office for additional recommendations. Electronic Signature(s) Signed: 02/05/2020 2:01:08 PM By: Worthy Keeler PA-C Entered By: Worthy Keeler on 02/05/2020 14:01:08 Michael Chang, Michael Chang ([id number]) Michael Chang, Michael Chang ([id number]) -------------------------------------------------------------------------------- SuperBill Details Patient Name: Michael Chang Date of Service: 02/05/2020 Medical Record Number: [id number] Patient Account Number: [id number] Date of Birth/Sex: 1946-02-20 (74 y.o. 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 [place] (primary) hypertension I25.10 Atherosclerotic heart disease of native coronary artery without angina pectoris Facility Procedures CPT4 Code: [id number] Description: 99214 - WOUND CARE VISIT-LEV 4 EST PT Modifier: Quantity: 1 Physician Procedures CPT4 Code: [id number] Description: 99213 - WC PHYS LEVEL 3 - EST PT Modifier: Quantity: 1 CPT4 Code: Description: ICD-10 Diagnosis Description I87.2 Venous insufficiency (chronic) (peripheral) I89.0 Lymphedema, not elsewhere classified L97.522 Non-pressure chronic ulcer of other part of left foot with fat layer ex L97.312 Non-pressure chronic ulcer of  right ankle with fat layer exposed Modifier: posed Quantity: Electronic Signature(s) Signed: 02/05/2020 2:06:11 PM By: Worthy Keeler PA-C Entered By: Worthy Keeler on 02/05/2020 14:06:10

## 2020-02-05 NOTE — Progress Notes (Addendum)
Michael Chang, Michael Chang ([id number]) Visit Report for 02/05/2020 Arrival Information Details Patient Name: Michael Chang Date of Service: 02/05/2020 1:30 PM Medical Record Number: [id number] Patient Account Number: [id number] Date of Birth/Sex: Jun 25, 1946 (74 y.o. M) Treating RN: Montey Hora Primary Care Dondi Aime: Alma Friendly Other Clinician: Referring [name]: Alma Friendly Treating Admire Bunnell/Extender: Melburn Hake, HOYT Weeks in Treatment: 4 Visit Information History Since Last Visit Added or deleted any medications: No Patient Arrived: Wheel Chair Any new allergies or adverse reactions: No Arrival Time: 13:36 Had a fall or experienced change in No Accompanied By: daughter activities of daily living that may affect Transfer Assistance: None risk of falls: Patient Identification Verified: Yes Signs or symptoms of abuse/neglect since last visito No Secondary Verification Process Completed: Yes Hospitalized since last visit: No Implantable device outside of the clinic excluding No cellular tissue based products placed in the center since last visit: Has Dressing in Place as Prescribed: Yes Pain Present Now: No Electronic Signature(s) Signed: 02/05/2020 2:37:45 PM By: Lorine Bears RCP, RRT, CHT Entered By: Lorine Bears on 02/05/2020 13:37:07 Michael Chang ([id number]) -------------------------------------------------------------------------------- Clinic Level of Care Assessment Details Patient Name: Michael Chang Date of Service: 02/05/2020 1:30 PM Medical Record Number: [id number] Patient Account Number: [id number] Date of Birth/Sex: [birth year] (73 y.o. M) Treating RN: Montey Hora Primary Care Shterna Laramee: Alma Friendly Other Clinician: Referring [name]: Alma Friendly Treating Magnus Crescenzo/Extender: Melburn Hake, HOYT Weeks in Treatment: 4 Clinic Level of Care Assessment Items TOOL 4 Quantity Score []  - Use when only an EandM is performed on  FOLLOW-UP visit 0 ASSESSMENTS - Nursing Assessment / Reassessment X - Reassessment of Co-morbidities (includes updates in patient status) 1 10 X- 1 5 Reassessment of Adherence to Treatment Plan ASSESSMENTS - Wound and Skin Assessment / Reassessment []  - Simple Wound Assessment / Reassessment - one wound 0 X- 3 5 Complex Wound Assessment / Reassessment - multiple wounds []  - 0 Dermatologic / Skin Assessment (not related to wound area) ASSESSMENTS - Focused Assessment []  - Circumferential Edema Measurements - multi extremities 0 []  - 0 Nutritional Assessment / Counseling / Intervention X- 1 5 Lower Extremity Assessment (monofilament, tuning fork, pulses) []  - 0 Peripheral Arterial Disease Assessment (using hand held doppler) ASSESSMENTS - Ostomy and/or Continence Assessment and Care []  - Incontinence Assessment and Management 0 []  - 0 Ostomy Care Assessment and Management (repouching, etc.) PROCESS - Coordination of Care X - Simple Patient / Family Education for ongoing care 1 15 []  - 0 Complex (extensive) Patient / Family Education for ongoing care X- 1 10 Staff obtains Programmer, systems, Records, Test Results / Process Orders []  - 0 Staff telephones HHA, Nursing Homes / Clarify orders / etc []  - 0 Routine Transfer to another Facility (non-emergent condition) []  - 0 Routine Hospital Admission (non-emergent condition) []  - 0 New Admissions / Biomedical engineer / Ordering NPWT, Apligraf, etc. []  - 0 Emergency Hospital Admission (emergent condition) X- 1 10 Simple Discharge Coordination []  - 0 Complex (extensive) Discharge Coordination PROCESS - Special Needs []  - Pediatric / Minor Patient Management 0 []  - 0 Isolation Patient Management []  - 0 Hearing / Language / Visual special needs []  - 0 Assessment of Community assistance (transportation, D/C planning, etc.) []  - 0 Additional assistance / Altered mentation []  - 0 Support Surface(s) Assessment (bed, cushion, seat,  etc.) INTERVENTIONS - Wound Cleansing / Measurement Michael Chang ([id number]) []  - 0 Simple Wound Cleansing - one wound X- 3 5 Complex Wound Cleansing - multiple wounds X- 1 5 Wound Imaging (photographs -  any number of wounds) []  - 0 Wound Tracing (instead of photographs) []  - 0 Simple Wound Measurement - one wound X- 3 5 Complex Wound Measurement - multiple wounds INTERVENTIONS - Wound Dressings X - Small Wound Dressing one or multiple wounds 1 10 []  - 0 Medium Wound Dressing one or multiple wounds []  - 0 Large Wound Dressing one or multiple wounds []  - 0 Application of Medications - topical []  - 0 Application of Medications - injection INTERVENTIONS - Miscellaneous []  - External ear exam 0 []  - 0 Specimen Collection (cultures, biopsies, blood, body fluids, etc.) []  - 0 Specimen(s) / Culture(s) sent or taken to Lab for analysis []  - 0 Patient Transfer (multiple staff / Civil Service fast streamer / Similar devices) []  - 0 Simple Staple / Suture removal (25 or less) []  - 0 Complex Staple / Suture removal (26 or more) []  - 0 Hypo / Hyperglycemic Management (close monitor of Blood Glucose) []  - 0 Ankle / Brachial Index (ABI) - do not check if billed separately X- 1 5 Vital Signs Has the patient been seen at the hospital within the last three years: Yes Total Score: 120 Level Of Care: New/Established - Level 4 Electronic Signature(s) Signed: 02/05/2020 3:24:15 PM By: Montey Hora Entered By: Montey Hora on 02/05/2020 13:58:03 Michael Chang ([id number]) -------------------------------------------------------------------------------- Encounter Discharge Information Details Patient Name: Michael Chang Date of Service: 02/05/2020 1:30 PM Medical Record Number: [id number] Patient Account Number: [id number] Date of Birth/Sex: 12/15/1945 (74 y.o. M) Treating RN: Montey Hora Primary Care Kori Goins: Alma Friendly Other Clinician: Referring [name]: Alma Friendly Treating  Blaize Epple/Extender: Melburn Hake, HOYT Weeks in Treatment: 4 Encounter Discharge Information Items Discharge Condition: Stable Ambulatory Status: Wheelchair Discharge Destination: Home Transportation: Private Auto Accompanied By: sister Schedule Follow-up Appointment: Yes Clinical Summary of Care: Electronic Signature(s) Signed: 02/05/2020 3:24:15 PM By: Montey Hora Entered By: Montey Hora on 02/05/2020 13:59:01 Michael Chang ([id number]) -------------------------------------------------------------------------------- Lower Extremity Assessment Details Patient Name: Michael Chang Date of Service: 02/05/2020 1:30 PM Medical Record Number: [id number] Patient Account Number: [id number] Date of Birth/Sex: 01-07-1946 (73 y.o. M) Treating RN: Army Melia Primary Care Analei Whinery: Alma Friendly Other Clinician: Referring Sherah Lund: Alma Friendly Treating Tabbitha Janvrin/Extender: Melburn Hake, HOYT Weeks in Treatment: 4 Edema Assessment Assessed: [Left: No] [Right: No] Edema: [Left: No] [Right: No] Vascular Assessment Pulses: Dorsalis Pedis Palpable: [Left:Yes] [Right:Yes] Electronic Signature(s) Signed: 02/08/2020 9:21:34 AM By: Army Melia Entered By: Army Melia on 02/05/2020 13:44:15 Michael Chang ([id number]) -------------------------------------------------------------------------------- Multi Wound Chart Details Patient Name: Michael Chang Date of Service: 02/05/2020 1:30 PM Medical Record Number: [id number] Patient Account Number: [id number] Date of Birth/Sex: January 22, 1946 (74 y.o. M) Treating RN: Montey Hora Primary Care Christan Ciccarelli: Alma Friendly Other Clinician: Referring Seng Larch: Alma Friendly Treating Jaidyn Usery/Extender: Melburn Hake, HOYT Weeks in Treatment: 4 Vital Signs Height(in): 71 Pulse(bpm): 37 Weight(lbs): 138 Blood Pressure(mmHg): 180/82 Body Mass Index(BMI): 19 Temperature(F): 98.0 Respiratory Rate(breaths/min): 16 Photos: Wound Location: Left,  Lateral Foot Right, Medial Ankle Right, Lateral Malleolus Wounding Event: Blister Gradually Appeared Gradually Appeared Primary Etiology: Venous Leg Ulcer Venous Leg Ulcer Venous Leg Ulcer Comorbid History: Hypertension, Myocardial Infarction, Hypertension, Myocardial Infarction, Hypertension, Myocardial Infarction, Peripheral Venous Disease, End Peripheral Venous Disease, End Peripheral Venous Disease, End Stage Renal Disease Stage Renal Disease Stage Renal Disease Date Acquired: 01/07/2019 01/07/2019 01/07/2019 Weeks of Treatment: 4 4 4  Wound Status: Healed - Epithelialized Open Healed - Epithelialized Measurements L x W x D (cm) 0x0x0 5.7x3.5x0.3 0x0x0 Area (cm) : 0 15.669 0 Volume (cm) : 0 4.701 0 % Reduction in  Area: 100.00% 52.00% 100.00% % Reduction in Volume: 100.00% 64.00% 100.00% Classification: Full Thickness Without Exposed Full Thickness Without Exposed Full Thickness Without Exposed Support Structures Support Structures Support Structures Exudate Amount: Medium Medium Medium Exudate Type: Serous Serous Serous Exudate Color: amber amber amber Wound Margin: Indistinct, nonvisible Thickened Indistinct, nonvisible Granulation Amount: None Present (0%) Large (67-100%) Medium (34-66%) Granulation Quality: N/A Pink Pink Necrotic Amount: Large (67-100%) Small (1-33%) Medium (34-66%) Exposed Structures: Fat Layer (Subcutaneous Tissue) Fat Layer (Subcutaneous Tissue) Fat Layer (Subcutaneous Tissue) Exposed: Yes Exposed: Yes Exposed: Yes Fascia: No Fascia: No Fascia: No Tendon: No Tendon: No Tendon: No Muscle: No Muscle: No Muscle: No Joint: No Joint: No Joint: No Bone: No Bone: No Bone: No Epithelialization: None None None Treatment Notes Electronic Signature(s) Signed: 02/05/2020 3:24:15 PM By: Montey Hora Entered By: Montey Hora on 02/05/2020 13:56:58 Michael Chang  ([id number]) -------------------------------------------------------------------------------- Multi-Disciplinary Care Plan Details Patient Name: Michael Chang Date of Service: 02/05/2020 1:30 PM Medical Record Number: [id number] Patient Account Number: [id number] Date of Birth/Sex: 1946/06/14 (73 y.o. M) Treating RN: Montey Hora Primary Care Chin Wachter: Alma Friendly Other Clinician: Referring Manases Etchison: Alma Friendly Treating Eligio Angert/Extender: Melburn Hake, HOYT Weeks in Treatment: 4 Active Inactive Orientation to the Wound Care Program Nursing Diagnoses: Knowledge deficit related to the wound healing center program Goals: Patient/caregiver will verbalize understanding of the [place] Program Date Initiated: 01/08/2020 Target Resolution Date: 02/05/2020 Goal Status: Active Interventions: Provide education on orientation to the wound center Notes: Venous Leg Ulcer Nursing Diagnoses: Potential for venous Insuffiency (use before diagnosis confirmed) Goals: Patient/caregiver will verbalize understanding of disease process and disease management Date Initiated: 01/08/2020 Target Resolution Date: 02/05/2020 Goal Status: Active Interventions: Assess peripheral edema status every visit. Notes: Wound/Skin Impairment Nursing Diagnoses: Impaired tissue integrity Goals: Ulcer/skin breakdown will have a volume reduction of 30% by week 4 Date Initiated: 01/08/2020 Target Resolution Date: 02/05/2020 Goal Status: Active Interventions: Assess ulceration(s) every visit Notes: Electronic Signature(s) Signed: 02/05/2020 3:24:15 PM By: Montey Hora Entered By: Montey Hora on 02/05/2020 13:56:49 Michael Chang ([id number]) -------------------------------------------------------------------------------- Pain Assessment Details Patient Name: Michael Chang Date of Service: 02/05/2020 1:30 PM Medical Record Number: [id number] Patient Account Number: [id number] Date of Birth/Sex:  22-Mar-1946 (73 y.o. M) Treating RN: Army Melia Primary Care Delsy Etzkorn: Alma Friendly Other Clinician: Referring Shaguana Love: Alma Friendly Treating Lailanie Hasley/Extender: Melburn Hake, HOYT Weeks in Treatment: 4 Active Problems Location of Pain Severity and Description of Pain Patient Has Paino No Site Locations Pain Management and Medication Current Pain Management: Electronic Signature(s) Signed: 02/08/2020 9:21:34 AM By: Army Melia Entered By: Army Melia on 02/05/2020 13:43:36 Michael Chang ([id number]) -------------------------------------------------------------------------------- Patient/Caregiver Education Details Patient Name: Michael Chang Date of Service: 02/05/2020 1:30 PM Medical Record Number: [id number] Patient Account Number: [id number] Date of Birth/Gender: 09/03/1946 (73 y.o. M) Treating RN: Montey Hora Primary Care Physician: Alma Friendly Other Clinician: Referring Physician: Alma Friendly Treating Physician/Extender: Sharalyn Ink in Treatment: 4 Education Assessment Education Provided To: Patient and Caregiver Education Topics Provided Wound/Skin Impairment: Handouts: Other: wound care as ordered Methods: Demonstration, Explain/Verbal Responses: State content correctly Electronic Signature(s) Signed: 02/05/2020 3:24:15 PM By: Montey Hora Entered By: Montey Hora on 02/05/2020 13:58:25 Michael Chang ([id number]) -------------------------------------------------------------------------------- Wound Assessment Details Patient Name: Michael Chang Date of Service: 02/05/2020 1:30 PM Medical Record Number: [id number] Patient Account Number: [id number] Date of Birth/Sex: May 28, 1946 (73 y.o. M) Treating RN: Montey Hora Primary Care Ladarian Bonczek: Alma Friendly Other Clinician: Referring Starlin Steib: Alma Friendly Treating Silena Wyss/Extender: Melburn Hake, HOYT Weeks in Treatment: 4 Wound Status  Wound Number: 1 Primary Venous Leg  Ulcer Etiology: Wound Location: Left, Lateral Foot Wound Healed - Epithelialized Wounding Event: Blister Status: Date Acquired: 01/07/2019 Comorbid Hypertension, Myocardial Infarction, Peripheral Venous Weeks Of Treatment: 4 History: Disease, End Stage Renal Disease Clustered Wound: No Photos Wound Measurements Length: (cm) 0 Width: (cm) 0 Depth: (cm) 0 Area: (cm) Volume: (cm) % Reduction in Area: 100% % Reduction in Volume: 100% Epithelialization: None 0 0 Wound Description Classification: Full Thickness Without Exposed Support Structu Wound Margin: Indistinct, nonvisible Exudate Amount: Medium Exudate Type: Serous Exudate Color: amber res Foul Odor After Cleansing: No Slough/Fibrino Yes Wound Bed Granulation Amount: None Present (0%) Exposed Structure Necrotic Amount: Large (67-100%) Fascia Exposed: No Necrotic Quality: Adherent Slough Fat Layer (Subcutaneous Tissue) Exposed: Yes Tendon Exposed: No Muscle Exposed: No Joint Exposed: No Bone Exposed: No Electronic Signature(s) Signed: 02/05/2020 3:24:15 PM By: Montey Hora Entered By: Montey Hora on 02/05/2020 13:54:25 Michael Chang ([id number]) -------------------------------------------------------------------------------- Wound Assessment Details Patient Name: Michael Chang Date of Service: 02/05/2020 1:30 PM Medical Record Number: [id number] Patient Account Number: [id number] Date of Birth/Sex: 1946/03/31 (74 y.o. M) Treating RN: Army Melia Primary Care Shloima Clinch: Alma Friendly Other Clinician: Referring Gunner Iodice: Alma Friendly Treating Lorris Carducci/Extender: Melburn Hake, HOYT Weeks in Treatment: 4 Wound Status Wound Number: 2 Primary Venous Leg Ulcer Etiology: Wound Location: Right, Medial Ankle Wound Open Wounding Event: Gradually Appeared Status: Date Acquired: 01/07/2019 Comorbid Hypertension, Myocardial Infarction, Peripheral Venous Weeks Of Treatment: 4 History: Disease, End Stage Renal  Disease Clustered Wound: No Photos Wound Measurements Length: (cm) 5.7 Width: (cm) 3.5 Depth: (cm) 0.3 Area: (cm) 15.669 Volume: (cm) 4.701 % Reduction in Area: 52% % Reduction in Volume: 64% Epithelialization: None Wound Description Classification: Full Thickness Without Exposed Support Structu Wound Margin: Thickened Exudate Amount: Medium Exudate Type: Serous Exudate Color: amber res Foul Odor After Cleansing: No Slough/Fibrino Yes Wound Bed Granulation Amount: Large (67-100%) Exposed Structure Granulation Quality: Pink Fascia Exposed: No Necrotic Amount: Small (1-33%) Fat Layer (Subcutaneous Tissue) Exposed: Yes Necrotic Quality: Adherent Slough Tendon Exposed: No Muscle Exposed: No Joint Exposed: No Bone Exposed: No Treatment Notes Wound #2 (Right, Medial Ankle) Notes collagen, ABD, conform, tubi Electronic Signature(s) Signed: 02/08/2020 9:21:34 AM By: Vinnie Langton ([id number]) Entered By: Army Melia on 02/05/2020 13:45:13 Michael Chang ([id number]) -------------------------------------------------------------------------------- Wound Assessment Details Patient Name: Michael Chang Date of Service: 02/05/2020 1:30 PM Medical Record Number: [id number] Patient Account Number: [id number] Date of Birth/Sex: [birth year] (74 y.o. M) Treating RN: Montey Hora Primary Care Marylin Lathon: Alma Friendly Other Clinician: Referring Patriece Archbold: Alma Friendly Treating Ivyanna Sibert/Extender: Melburn Hake, HOYT Weeks in Treatment: 4 Wound Status Wound Number: 3 Primary Venous Leg Ulcer Etiology: Wound Location: Right, Lateral Malleolus Wound Healed - Epithelialized Wounding Event: Gradually Appeared Status: Date Acquired: 01/07/2019 Comorbid Hypertension, Myocardial Infarction, Peripheral Venous Weeks Of Treatment: 4 History: Disease, End Stage Renal Disease Clustered Wound: No Photos Wound Measurements Length: (cm) 0 Width: (cm) 0 Depth: (cm) 0 Area:  (cm) Volume: (cm) % Reduction in Area: 100% % Reduction in Volume: 100% Epithelialization: None 0 0 Wound Description Classification: Full Thickness Without Exposed Support Structu Wound Margin: Indistinct, nonvisible Exudate Amount: Medium Exudate Type: Serous Exudate Color: amber res Foul Odor After Cleansing: No Slough/Fibrino Yes Wound Bed Granulation Amount: Medium (34-66%) Exposed Structure Granulation Quality: Pink Fascia Exposed: No Necrotic Amount: Medium (34-66%) Fat Layer (Subcutaneous Tissue) Exposed: Yes Necrotic Quality: Adherent Slough Tendon Exposed: No Muscle Exposed: No Joint Exposed: No Bone Exposed: No Electronic Signature(s) Signed: 02/05/2020 3:24:15 PM By: Marjory Lies,  Di Kindle Entered By: Montey Hora on 02/05/2020 13:56:43 Michael Chang ([id number]) -------------------------------------------------------------------------------- Vitals Details Patient Name: Michael Chang Date of Service: 02/05/2020 1:30 PM Medical Record Number: [id number] Patient Account Number: [id number] Date of Birth/Sex: 1945/12/25 (73 y.o. M) Treating RN: Montey Hora Primary Care Adelfa Lozito: Alma Friendly Other Clinician: Referring Albertus Chiarelli: Alma Friendly Treating Nazariah Cadet/Extender: Melburn Hake, HOYT Weeks in Treatment: 4 Vital Signs Time Taken: 13:38 Temperature (F): 98.0 Height (in): 71 Pulse (bpm): 64 Weight (lbs): 138 Respiratory Rate (breaths/min): 16 Body Mass Index (BMI): 19.2 Blood Pressure (mmHg): 180/82 Reference Range: 80 - 120 mg / dl Electronic Signature(s) Signed: 02/05/2020 2:37:45 PM By: Lorine Bears RCP, RRT, CHT Entered By: Lorine Bears on 02/05/2020 13:40:54

## 2020-02-12 ENCOUNTER — Encounter: Payer: Medicare Other | Admitting: Physician Assistant

## 2020-02-14 ENCOUNTER — Observation Stay
Admission: EM | Admit: 2020-02-14 | Discharge: 2020-02-15 | Disposition: A | Discharge status: Home / Self Care | Payer: Medicare Other | Attending: Internal Medicine | Admitting: Internal Medicine

## 2020-02-14 ENCOUNTER — Observation Stay (HOSPITAL_BASED_OUTPATIENT_CLINIC_OR_DEPARTMENT_OTHER)
Admit: 2020-02-14 | Discharge: 2020-02-14 | Disposition: A | Discharge status: Home / Self Care | Payer: Medicare Other | Attending: Internal Medicine | Admitting: Internal Medicine

## 2020-02-14 ENCOUNTER — Encounter: Payer: Self-pay | Admitting: Emergency Medicine

## 2020-02-14 ENCOUNTER — Emergency Department: Payer: Medicare Other

## 2020-02-14 ENCOUNTER — Other Ambulatory Visit: Payer: Self-pay

## 2020-02-14 DIAGNOSIS — Z79899 Other long term (current) drug therapy: Secondary | ICD-10-CM | POA: Insufficient documentation

## 2020-02-14 DIAGNOSIS — I5042 Chronic combined systolic (congestive) and diastolic (congestive) heart failure: Secondary | ICD-10-CM | POA: Diagnosis not present

## 2020-02-14 DIAGNOSIS — I130 Hypertensive heart and chronic kidney disease with heart failure and stage 1 through stage 4 chronic kidney disease, or unspecified chronic kidney disease: Secondary | ICD-10-CM | POA: Diagnosis not present

## 2020-02-14 DIAGNOSIS — W19XXXA Unspecified fall, initial encounter: Secondary | ICD-10-CM | POA: Diagnosis not present

## 2020-02-14 DIAGNOSIS — E440 Moderate protein-calorie malnutrition: Secondary | ICD-10-CM | POA: Diagnosis not present

## 2020-02-14 DIAGNOSIS — N183 Chronic kidney disease, stage 3 unspecified: Secondary | ICD-10-CM | POA: Diagnosis not present

## 2020-02-14 DIAGNOSIS — N1832 Chronic kidney disease, stage 3b: Secondary | ICD-10-CM | POA: Diagnosis not present

## 2020-02-14 DIAGNOSIS — Z7982 Long term (current) use of aspirin: Secondary | ICD-10-CM | POA: Diagnosis not present

## 2020-02-14 DIAGNOSIS — S0181XA Laceration without foreign body of other part of head, initial encounter: Secondary | ICD-10-CM | POA: Insufficient documentation

## 2020-02-14 DIAGNOSIS — R55 Syncope and collapse: Secondary | ICD-10-CM

## 2020-02-14 DIAGNOSIS — I252 Old myocardial infarction: Secondary | ICD-10-CM | POA: Insufficient documentation

## 2020-02-14 DIAGNOSIS — I10 Essential (primary) hypertension: Secondary | ICD-10-CM | POA: Diagnosis present

## 2020-02-14 DIAGNOSIS — D509 Iron deficiency anemia, unspecified: Secondary | ICD-10-CM

## 2020-02-14 DIAGNOSIS — I2510 Atherosclerotic heart disease of native coronary artery without angina pectoris: Secondary | ICD-10-CM | POA: Insufficient documentation

## 2020-02-14 DIAGNOSIS — I255 Ischemic cardiomyopathy: Secondary | ICD-10-CM | POA: Diagnosis present

## 2020-02-14 DIAGNOSIS — I739 Peripheral vascular disease, unspecified: Secondary | ICD-10-CM | POA: Diagnosis not present

## 2020-02-14 DIAGNOSIS — I350 Nonrheumatic aortic (valve) stenosis: Secondary | ICD-10-CM | POA: Insufficient documentation

## 2020-02-14 DIAGNOSIS — I6782 Cerebral ischemia: Secondary | ICD-10-CM | POA: Diagnosis not present

## 2020-02-14 DIAGNOSIS — D631 Anemia in chronic kidney disease: Secondary | ICD-10-CM | POA: Insufficient documentation

## 2020-02-14 DIAGNOSIS — N189 Chronic kidney disease, unspecified: Secondary | ICD-10-CM | POA: Diagnosis present

## 2020-02-14 DIAGNOSIS — Z20822 Contact with and (suspected) exposure to covid-19: Secondary | ICD-10-CM | POA: Insufficient documentation

## 2020-02-14 DIAGNOSIS — R531 Weakness: Secondary | ICD-10-CM

## 2020-02-14 HISTORY — DX: Syncope and collapse: R55

## 2020-02-14 LAB — ABO/RH: ABO/RH(D): A POS

## 2020-02-14 LAB — CBC WITH DIFFERENTIAL/PLATELET
Abs Immature Granulocytes: 0.01 10*3/uL (ref 0.00–0.07)
Basophils Absolute: 0 10*3/uL (ref 0.0–0.1)
Basophils Relative: 1 %
Eosinophils Absolute: 0.4 10*3/uL (ref 0.0–0.5)
Eosinophils Relative: 6 %
HCT: 28.8 % — ABNORMAL LOW (ref 39.0–52.0)
Hemoglobin: 8.8 g/dL — ABNORMAL LOW (ref 13.0–17.0)
Immature Granulocytes: 0 %
Lymphocytes Relative: 23 %
Lymphs Abs: 1.4 10*3/uL (ref 0.7–4.0)
MCH: 24.9 pg — ABNORMAL LOW (ref 26.0–34.0)
MCHC: 30.6 g/dL (ref 30.0–36.0)
MCV: 81.4 fL (ref 80.0–100.0)
Monocytes Absolute: 0.6 10*3/uL (ref 0.1–1.0)
Monocytes Relative: 10 %
Neutro Abs: 3.5 10*3/uL (ref 1.7–7.7)
Neutrophils Relative %: 60 %
Platelets: 270 10*3/uL (ref 150–400)
RBC: 3.54 MIL/uL — ABNORMAL LOW (ref 4.22–5.81)
RDW: 22 % — ABNORMAL HIGH (ref 11.5–15.5)
WBC: 5.8 10*3/uL (ref 4.0–10.5)
nRBC: 0 % (ref 0.0–0.2)

## 2020-02-14 LAB — COMPREHENSIVE METABOLIC PANEL
ALT: 16 U/L (ref 0–44)
AST: 29 U/L (ref 15–41)
Albumin: 2.9 g/dL — ABNORMAL LOW (ref 3.5–5.0)
Alkaline Phosphatase: 51 U/L (ref 38–126)
Anion gap: 7 (ref 5–15)
BUN: 39 mg/dL — ABNORMAL HIGH (ref 8–23)
CO2: 26 mmol/L (ref 22–32)
Calcium: 8.5 mg/dL — ABNORMAL LOW (ref 8.9–10.3)
Chloride: 106 mmol/L (ref 98–111)
Creatinine, Ser: 1.97 mg/dL — ABNORMAL HIGH (ref 0.61–1.24)
GFR calc Af Amer: 38 mL/min — ABNORMAL LOW (ref >60)
GFR calc non Af Amer: 33 mL/min — ABNORMAL LOW (ref >60)
Glucose, Bld: 112 mg/dL — ABNORMAL HIGH (ref 70–99)
Potassium: 3.7 mmol/L (ref 3.5–5.1)
Sodium: 139 mmol/L (ref 135–145)
Total Bilirubin: 0.6 mg/dL (ref 0.3–1.2)
Total Protein: 8.3 g/dL — ABNORMAL HIGH (ref 6.5–8.1)

## 2020-02-14 LAB — TROPONIN I (HIGH SENSITIVITY)
Troponin I (High Sensitivity): 44 ng/L — ABNORMAL HIGH (ref <18)
Troponin I (High Sensitivity): 36 ng/L — ABNORMAL HIGH (ref <18)
Troponin I (High Sensitivity): 36 ng/L — ABNORMAL HIGH (ref <18)

## 2020-02-14 LAB — ECHOCARDIOGRAM COMPLETE
Height: 71 in
Weight: 2256 oz

## 2020-02-14 LAB — SARS CORONAVIRUS 2 BY RT PCR (HOSPITAL ORDER, PERFORMED IN ~~LOC~~ HOSPITAL LAB): SARS Coronavirus 2: NEGATIVE

## 2020-02-14 MED ORDER — ACETAMINOPHEN 650 MG RE SUPP: 650.0000 mg | Freq: Four times a day (QID) | RECTAL | Status: DC | PRN

## 2020-02-14 MED ORDER — HEPARIN SODIUM (PORCINE) 5000 UNIT/ML IJ SOLN
5000.0000 [IU] | Freq: Three times a day (TID) | INTRAMUSCULAR | Status: DC
  Administered 2020-02-14 – 2020-02-15 (×3): 5000 [IU] via SUBCUTANEOUS
  Filled 2020-02-14 (×3): qty 1

## 2020-02-14 MED ORDER — ASPIRIN EC 81 MG PO TBEC
81.0000 mg | DELAYED_RELEASE_TABLET | Freq: Every day | ORAL | Status: DC
  Administered 2020-02-14 – 2020-02-15 (×2): 81 mg via ORAL
  Filled 2020-02-14 (×2): qty 1

## 2020-02-14 MED ORDER — HYDRALAZINE HCL 100 MG PO TABS: 100.0000 mg | ORAL_TABLET | Freq: Two times a day (BID) | ORAL | Status: DC

## 2020-02-14 MED ORDER — SODIUM CHLORIDE 0.9% FLUSH: 3.0000 mL | INTRAVENOUS | Status: DC | PRN

## 2020-02-14 MED ORDER — SODIUM CHLORIDE 0.9 % IV SOLN: 250.0000 mL | INTRAVENOUS | Status: DC | PRN

## 2020-02-14 MED ORDER — FERROUS SULFATE 325 (65 FE) MG PO TABS
325.0000 mg | ORAL_TABLET | Freq: Every day | ORAL | Status: DC
  Administered 2020-02-14 – 2020-02-15 (×2): 325 mg via ORAL
  Filled 2020-02-14 (×2): qty 1

## 2020-02-14 MED ORDER — ADULT MULTIVITAMIN W/MINERALS CH
1.0000 | ORAL_TABLET | Freq: Every day | ORAL | Status: DC
  Administered 2020-02-14 – 2020-02-15 (×2): 1 via ORAL
  Filled 2020-02-14 (×2): qty 1

## 2020-02-14 MED ORDER — ASPIRIN 81 MG PO CHEW
324.0000 mg | CHEWABLE_TABLET | Freq: Once | ORAL | Status: AC
  Administered 2020-02-14: 324 mg via ORAL
  Filled 2020-02-14: qty 4

## 2020-02-14 MED ORDER — SODIUM CHLORIDE 0.9% FLUSH
3.0000 mL | Freq: Two times a day (BID) | INTRAVENOUS | Status: DC
  Administered 2020-02-14 – 2020-02-15 (×3): 3 mL via INTRAVENOUS

## 2020-02-14 MED ORDER — ISOSORBIDE MONONITRATE ER 60 MG PO TB24
60.0000 mg | ORAL_TABLET | Freq: Every day | ORAL | Status: DC
  Administered 2020-02-14 – 2020-02-15 (×2): 60 mg via ORAL
  Filled 2020-02-14 (×2): qty 1

## 2020-02-14 MED ORDER — HYDRALAZINE HCL 50 MG PO TABS
100.0000 mg | ORAL_TABLET | Freq: Two times a day (BID) | ORAL | Status: DC
  Administered 2020-02-14 – 2020-02-15 (×3): 100 mg via ORAL
  Filled 2020-02-14 (×3): qty 2

## 2020-02-14 MED ORDER — ACETAMINOPHEN 325 MG PO TABS: 650.0000 mg | ORAL_TABLET | Freq: Four times a day (QID) | ORAL | Status: DC | PRN

## 2020-02-14 MED ORDER — SODIUM CHLORIDE 0.9% FLUSH
3.0000 mL | Freq: Two times a day (BID) | INTRAVENOUS | Status: DC
  Administered 2020-02-14: 3 mL via INTRAVENOUS

## 2020-02-14 MED ORDER — CARVEDILOL 12.5 MG PO TABS
12.5000 mg | ORAL_TABLET | Freq: Two times a day (BID) | ORAL | Status: DC
  Administered 2020-02-14 – 2020-02-15 (×3): 12.5 mg via ORAL
  Filled 2020-02-14 (×2): qty 1
  Filled 2020-02-14: qty 2
  Filled 2020-02-14 (×2): qty 1

## 2020-02-14 MED ORDER — AMLODIPINE BESYLATE 5 MG PO TABS: 5.0000 mg | ORAL_TABLET | Freq: Every day | ORAL | Status: DC

## 2020-02-14 NOTE — ED Notes (Signed)
Cardiologist at bedside.  

## 2020-02-14 NOTE — H&P (Signed)
History and Physical    Michael Chang ZOX:[id number] DOB: 11/19/1945 DOA: 02/14/2020  PCP: [name], NP   Patient coming from: Home  I have personally briefly reviewed patient's old medical records in [place]  Chief Complaint: Fall  HPI: Michael Chang is a 74 y.o. male with medical history significant for stage III chronic kidney disease, coronary artery disease, hypertension and ischemic cardiomyopathy (LVEF 40 - 45%) who presents to the emergency room via EMS after he had a fall at home.  Patient states that he was getting up from bed to go to the bathroom when he abruptly became dizzy and lost his balance.  He denies any loss of consciousness even though his sister states she had to arouse him for him to wake up.  He denies having any preceding symptoms such as chest pain, shortness of breath, nausea, vomiting or palpitations.  Patient states that his symptoms are consistent with when he had his heart attack in March. Twelve-lead EKG showed sinus rhythm with LVH.  He has a slight elevation in his troponin levels.  ED Course: Patient is a 74 year old who presents to emergency room after he had a near syncopal episode at home which he states is similar to when he had his heart attack in March.  He has no acute EKG changes.  He will be referred to observation for further evaluation.  Review of Systems: As per HPI otherwise 10 point review of systems negative.    Past Medical History:  Diagnosis Date  . AKI (acute kidney injury) ([place])   . CHF (congestive heart failure) ([place])   . Chronic ulcer of right lower extremity ([place])   . CKD (chronic kidney disease)   . NSTEMI (non-ST elevated myocardial infarction) ([place])   . PVD (peripheral vascular disease) ([place])     History reviewed. No pertinent surgical history.   reports that he has never smoked. He has never used smokeless tobacco. He reports previous alcohol use. No history on file for drug.  No Known  Allergies  History reviewed. No pertinent family history.   Prior to Admission medications   Medication Sig Start Date End Date Taking? Authorizing Provider  amLODipine (NORVASC) 5 MG tablet Take 1 tablet (5 mg total) by mouth in the morning and at bedtime. 01/15/20 04/14/20 Yes Wellington Hampshire, MD  aspirin EC 81 MG tablet Take 1 tablet (81 mg total) by mouth daily. 01/05/20  Yes Wellington Hampshire, MD  carvedilol (COREG) 12.5 MG tablet Take 1 tablet (12.5 mg total) by mouth 2 (two) times daily with a meal. 01/15/20  Yes Wellington Hampshire, MD  ferrous sulfate 325 (65 FE) MG tablet Take 325 mg by mouth daily with breakfast.   Yes [provider]  hydrALAZINE (APRESOLINE) 100 MG tablet Take 1 tablet (100 mg total) by mouth 2 (two) times daily. 01/15/20  Yes Wellington Hampshire, MD  isosorbide mononitrate (IMDUR) 60 MG 24 hr tablet Take 1 tablet (60 mg total) by mouth daily. For blood pressure. 02/04/20  Yes [name], NP  Multiple Vitamin (MULTIVITAMIN WITH MINERALS) TABS tablet Take 1 tablet by mouth daily.   Yes [provider]    Physical Exam: Vitals:   02/14/20 0228 02/14/20 0248 02/14/20 0249 02/14/20 0300  BP:  (!) 149/80  (!) 161/82  Pulse:  (!) 59  (!) 57  Resp:  16  12  Temp:  98 F (36.7 C)    TempSrc:  Oral  SpO2: 100% 100%  100%  Weight:   64 kg   Height:   5\' 11"  (1.803 m)      Vitals:   02/14/20 0228 02/14/20 0248 02/14/20 0249 02/14/20 0300  BP:  (!) 149/80  (!) 161/82  Pulse:  (!) 59  (!) 57  Resp:  16  12  Temp:  98 F (36.7 C)    TempSrc:  Oral    SpO2: 100% 100%  100%  Weight:   64 kg   Height:   5\' 11"  (1.803 m)     Constitutional: NAD, alert and oriented x 3. Laceration top right forehead, Thin Eyes: PERRL, lids and conjunctivae pallor ENMT: Mucous membranes are moist.  Neck: normal, supple, no masses, no thyromegaly Respiratory: clear to auscultation bilaterally, no wheezing, no crackles. Normal respiratory effort. No accessory  muscle use.  Cardiovascular: Regular rate and rhythm, no murmurs / rubs / gallops. No extremity edema. 2+ pedal pulses. No carotid bruits.  Abdomen: no tenderness, no masses palpated. No hepatosplenomegaly. Bowel sounds positive.  Musculoskeletal: no clubbing / cyanosis. No joint deformity upper and lower extremities.  Skin: no rashes, lesions, ulcers.  Neurologic: No gross focal neurologic deficit. Psychiatric: Normal mood and affect.   Labs on Admission: I have personally reviewed following labs and imaging studies  CBC: Recent Labs  Lab 02/14/20 0247  WBC 5.8  NEUTROABS 3.5  HGB 8.8*  HCT 28.8*  MCV 81.4  PLT 161   Basic Metabolic Panel: Recent Labs  Lab 02/14/20 0247  NA 139  K 3.7  CL 106  CO2 26  GLUCOSE 112*  BUN 39*  CREATININE 1.97*  CALCIUM 8.5*   GFR: Estimated Creatinine Clearance: 30.2 mL/min (A) (by C-G formula based on SCr of 1.97 mg/dL (H)). Liver Function Tests: Recent Labs  Lab 02/14/20 0247  AST 29  ALT 16  ALKPHOS 51  BILITOT 0.6  PROT 8.3*  ALBUMIN 2.9*   No results for input(s): LIPASE, AMYLASE in the last 168 hours. No results for input(s): AMMONIA in the last 168 hours. Coagulation Profile: No results for input(s): INR, PROTIME in the last 168 hours. Cardiac Enzymes: No results for input(s): CKTOTAL, CKMB, CKMBINDEX, TROPONINI in the last 168 hours. BNP (last 3 results) No results for input(s): PROBNP in the last 8760 hours. HbA1C: No results for input(s): HGBA1C in the last 72 hours. CBG: No results for input(s): GLUCAP in the last 168 hours. Lipid Profile: No results for input(s): CHOL, HDL, LDLCALC, TRIG, CHOLHDL, LDLDIRECT in the last 72 hours. Thyroid Function Tests: No results for input(s): TSH, T4TOTAL, FREET4, T3FREE, THYROIDAB in the last 72 hours. Anemia Panel: No results for input(s): VITAMINB12, FOLATE, FERRITIN, TIBC, IRON, RETICCTPCT in the last 72 hours. Urine analysis: No results found for: COLORURINE,  APPEARANCEUR, [place], [place], GLUCOSEU, HGBUR, BILIRUBINUR, KETONESUR, PROTEINUR, UROBILINOGEN, NITRITE, LEUKOCYTESUR  Radiological Exams on Admission: CT Head Wo Contrast  Result Date: 02/14/2020 CLINICAL DATA:  Head trauma. EXAM: CT HEAD WITHOUT CONTRAST TECHNIQUE: Contiguous axial images were obtained from the base of the skull through the vertex without intravenous contrast. COMPARISON:  None. FINDINGS: Brain: Mild generalized age related parenchymal volume loss with commensurate dilatation of the ventricles and sulci. Chronic small vessel ischemic changes within the bilateral periventricular and subcortical white matter regions. No mass, hemorrhage, edema or other evidence of acute parenchymal abnormality. No extra-axial hemorrhage. Vascular: Chronic calcified atherosclerotic changes of the large vessels at the skull base. No unexpected hyperdense vessel. Skull: Normal. Negative for fracture or  focal lesion. Sinuses/Orbits: Chronic appearing frontal sinus disease. Periorbital and retro-orbital soft tissues are unremarkable. Other: Scalp edema/laceration overlying the RIGHT frontal bone. No underlying fracture. IMPRESSION: 1. Scalp edema/laceration overlying the RIGHT frontal bone. No underlying fracture. 2. No acute intracranial abnormality. No intracranial mass, hemorrhage or edema. 3. Chronic small vessel ischemic changes in the white matter. Electronically Signed   By: Franki Cabot M.D.   On: 02/14/2020 04:26   DG Chest Port 1 View  Result Date: 02/14/2020 CLINICAL DATA:  Fall EXAM: PORTABLE CHEST 1 VIEW COMPARISON:  None. FINDINGS: The heart size and mediastinal contours are within normal limits. Both lungs are clear. The visualized skeletal structures are unremarkable. IMPRESSION: No active disease. Electronically Signed   By: Ulyses Jarred M.D.   On: 02/14/2020 03:47    EKG: Independently reviewed.  Sinus rhythm LVH  Assessment/Plan Principal Problem:   Near syncope Active  Problems:   PVD (peripheral vascular disease) (HCC)   Chronic kidney disease   Essential hypertension   Cardiomyopathy, ischemic    Near syncope Etiology unclear Orthostatic blood pressure checks Obtain 2D echocardiogram to assess LVEF Holding parameters for carvedilol since patient had bradycardia We will request cardiology consult   Stage III chronic kidney disease Stable Monitor closely during this hospitalization   Ischemic cardiomyopathy Continue carvedilol, hydralazine and nitrates   Essential hypertension Blood pressure stable on hydralazine, nitrates and carvedilol   Peripheral vascular disease Continue aspirin and beta-blockers   Anemia of chronic kidney disease H&H is stable  DVT prophylaxis: Heparin Code Status: Full Family Communication: Greater than 50% of time was spent discussing patient's condition and plan of care with him at the bedside.  All questions and concerns have been addressed. Disposition Plan: Back to previous home environment Consults called: Cardiology    Jeylin Woodmansee MD [facility]     02/14/2020, 8:14 AM

## 2020-02-14 NOTE — ED Notes (Signed)
Breakfast meal given 

## 2020-02-14 NOTE — Plan of Care (Signed)
Pt received from the ED. Oriented to room and unit.

## 2020-02-14 NOTE — ED Provider Notes (Signed)
[facility] Emergency Department Provider Note  ____________________________________________   First MD Initiated Contact with Patient 02/14/20 0230     (approximate)  I have reviewed the triage vital signs and the nursing notes.   HISTORY  Chief Complaint Fall   HPI Michael Chang is a 74 y.o. male with below list of previous medical conditions including acute kidney injury CHF NSTEMI peripheral vascular disease presents to the emergency department secondary to near syncopal episode.  Patient states that he was getting up from bed to go to the bathroom when he abruptly became dizzy lost his balance.  Patient denies any loss of consciousness.  Patient denies any preceding symptoms no chest pain shortness of breath.  Patient denies any lower extremity pain or swelling.  Patient denies any palpitations.  Patient does state that symptoms are consistent with when he had his heart attack before        Past Medical History:  Diagnosis Date  . AKI (acute kidney injury) ([place])   . CHF (congestive heart failure) ([place])   . Chronic ulcer of right lower extremity ([place])   . CKD (chronic kidney disease)   . NSTEMI (non-ST elevated myocardial infarction) ([place])   . PVD (peripheral vascular disease) [facility])     Patient Active Problem List   Diagnosis Date Noted  . Skin ulcers of foot, bilateral ([place]) 01/07/2020  . CAD (coronary artery disease) 01/07/2020  . History of non-ST elevation myocardial infarction (NSTEMI) 01/05/2020  . Congestive heart failure ([place]) 01/05/2020  . PVD (peripheral vascular disease) ([place]) 01/05/2020  . Chronic ulcer of right lower extremity with fat layer exposed ([place]) 01/05/2020  . Chronic kidney disease 01/05/2020  . Essential hypertension 01/05/2020  . Elevated TSH 01/05/2020    History reviewed. No pertinent surgical history.  Prior to Admission medications   Medication Sig Start Date End Date Taking? Authorizing Provider  amLODipine  (NORVASC) 5 MG tablet Take 1 tablet (5 mg total) by mouth in the morning and at bedtime. 01/15/20 04/14/20 Yes Wellington Hampshire, MD  aspirin EC 81 MG tablet Take 1 tablet (81 mg total) by mouth daily. 01/05/20  Yes Wellington Hampshire, MD  carvedilol (COREG) 12.5 MG tablet Take 1 tablet (12.5 mg total) by mouth 2 (two) times daily with a meal. 01/15/20  Yes Wellington Hampshire, MD  ferrous sulfate 325 (65 FE) MG tablet Take 325 mg by mouth daily with breakfast.   Yes [provider]  hydrALAZINE (APRESOLINE) 100 MG tablet Take 1 tablet (100 mg total) by mouth 2 (two) times daily. 01/15/20  Yes Wellington Hampshire, MD  isosorbide mononitrate (IMDUR) 60 MG 24 hr tablet Take 1 tablet (60 mg total) by mouth daily. For blood pressure. 02/04/20  Yes [name], NP  Multiple Vitamin (MULTIVITAMIN WITH MINERALS) TABS tablet Take 1 tablet by mouth daily.   Yes [provider]    Allergies Patient has no known allergies.  History reviewed. No pertinent family history.  Social History Social History   Tobacco Use  . Smoking status: Never Smoker  . Smokeless tobacco: Never Used  Substance Use Topics  . Alcohol use: Not Currently  . Drug use: Not on file    Review of Systems Constitutional: No fever/chills Eyes: No visual changes. ENT: No sore throat. Cardiovascular: Denies chest pain. Respiratory: Denies shortness of breath. Gastrointestinal: No abdominal pain.  No nausea, no vomiting.  No diarrhea.  No constipation. Genitourinary: Negative for dysuria. Musculoskeletal: Negative for neck  pain.  Negative for back pain. Integumentary: Negative for rash. Neurological: Negative for headaches, focal weakness or numbness.  ____________________________________________   PHYSICAL EXAM:  VITAL SIGNS: ED Triage Vitals  Enc Vitals Group     BP 02/14/20 0248 (!) 149/80     Pulse Rate 02/14/20 0248 (!) 59     Resp 02/14/20 0248 16     Temp 02/14/20 0248 98 F (36.7 C)     Temp  Source 02/14/20 0248 Oral     SpO2 02/14/20 0228 100 %     Weight 02/14/20 0249 64 kg (141 lb)     Height 02/14/20 0249 1.803 m (5\' 11" )     Head Circumference --      Peak Flow --      Pain Score 02/14/20 0249 0     Pain Loc --      Pain Edu? --      Excl. in [place]? --    Constitutional: Alert and oriented.  Eyes: Conjunctivae are normal.  Head:  Mouth/Throat: Patient is wearing a mask. Neck: No stridor.  No meningeal signs.   Cardiovascular: Normal rate, regular rhythm. Good peripheral circulation. Grossly normal heart sounds. Respiratory: Normal respiratory effort.  No retractions. Gastrointestinal: Soft and nontender. No distention.  Musculoskeletal: No lower extremity tenderness nor edema. No gross deformities of extremities. Neurologic:  Normal speech and language. No gross focal neurologic deficits are appreciated.  Skin: 1 inch right forehead linear laceration Psychiatric: Mood and affect are normal. Speech and behavior are normal.  ____________________________________________   LABS (all labs ordered are listed, but only abnormal results are displayed)  Labs Reviewed  CBC WITH DIFFERENTIAL/PLATELET - Abnormal; Notable for the following components:      Result Value   RBC 3.54 (*)    Hemoglobin 8.8 (*)    HCT 28.8 (*)    MCH 24.9 (*)    RDW 22.0 (*)    All other components within normal limits  COMPREHENSIVE METABOLIC PANEL - Abnormal; Notable for the following components:   Glucose, Bld 112 (*)    BUN 39 (*)    Creatinine, Ser 1.97 (*)    Calcium 8.5 (*)    Total Protein 8.3 (*)    Albumin 2.9 (*)    GFR calc non Af Amer 33 (*)    GFR calc Af Amer 38 (*)    All other components within normal limits  TROPONIN I (HIGH SENSITIVITY) - Abnormal; Notable for the following components:   Troponin I (High Sensitivity) 44 (*)    All other components within normal limits  CBG MONITORING, ED  ABO/RH   ____________________________________________  EKG ED ECG  REPORT I, [place] N Burnett Spray, the attending physician, personally viewed and interpreted this ECG.   Date: 02/14/2020  EKG Time: 2:38 AM  Rate: 63  Rhythm: Normal sinus rhythm with LVH  Axis: Normal  Intervals: Normal  ST&T Change: None   ____________________________________________  RADIOLOGY I, [place] Darriel Utter, personally viewed and evaluated these images (plain radiographs) as part of my medical decision making, as well as reviewing the written report by the radiologist.  ED MD interpretation: No acute intracranial abnormality noted on CT head per radiologist.  Official radiology report(s): CT Head Wo Contrast  Result Date: 02/14/2020 CLINICAL DATA:  Head trauma. EXAM: CT HEAD WITHOUT CONTRAST TECHNIQUE: Contiguous axial images were obtained from the base of the skull through the vertex without intravenous contrast. COMPARISON:  None. FINDINGS: Brain: Mild generalized age related parenchymal volume loss  with commensurate dilatation of the ventricles and sulci. Chronic small vessel ischemic changes within the bilateral periventricular and subcortical white matter regions. No mass, hemorrhage, edema or other evidence of acute parenchymal abnormality. No extra-axial hemorrhage. Vascular: Chronic calcified atherosclerotic changes of the large vessels at the skull base. No unexpected hyperdense vessel. Skull: Normal. Negative for fracture or focal lesion. Sinuses/Orbits: Chronic appearing frontal sinus disease. Periorbital and retro-orbital soft tissues are unremarkable. Other: Scalp edema/laceration overlying the RIGHT frontal bone. No underlying fracture. IMPRESSION: 1. Scalp edema/laceration overlying the RIGHT frontal bone. No underlying fracture. 2. No acute intracranial abnormality. No intracranial mass, hemorrhage or edema. 3. Chronic small vessel ischemic changes in the white matter. Electronically Signed   By: Franki Cabot M.D.   On: 02/14/2020 04:26   DG Chest Port 1 View  Result  Date: 02/14/2020 CLINICAL DATA:  Fall EXAM: PORTABLE CHEST 1 VIEW COMPARISON:  None. FINDINGS: The heart size and mediastinal contours are within normal limits. Both lungs are clear. The visualized skeletal structures are unremarkable. IMPRESSION: No active disease. Electronically Signed   By: Ulyses Jarred M.D.   On: 02/14/2020 03:47     .Marland KitchenLaceration Repair  Date/Time: 02/14/2020 9:46 PM Performed by: Gregor Hams, MD Authorized by: Gregor Hams, MD   Consent:    Consent obtained:  Verbal   Consent given by:  Patient   Risks discussed:  Infection, pain, retained foreign body, poor cosmetic result and poor wound healing Anesthesia (see MAR for exact dosages):    Anesthesia method:  None Repair type:    Repair type:  Simple Exploration:    Hemostasis achieved with:  Direct pressure   Contaminated: no   Treatment:    Area cleansed with:  Saline and Betadine   Amount of cleaning:  Extensive   Irrigation solution:  Sterile saline   Visualized foreign bodies/material removed: no   Skin repair:    Repair method:  Tissue adhesive Approximation:    Approximation:  Close Post-procedure details:    Dressing:  Sterile dressing   Patient tolerance of procedure:  Tolerated well, no immediate complications     ____________________________________________   INITIAL IMPRESSION / MDM / ASSESSMENT AND PLAN / ED COURSE  As part of my medical decision making, I reviewed the following data within the [place] NUMBER   74 year old male presenting with above-stated history and physical exam secondary to near syncopal episode.  Differential diagnosis including but not limited to ACS intracranial pathology including CVA cardiac arrhythmia.  EKG revealed no evidence of arrhythmia no evidence of ischemia or infarction.  CT head revealed no acute intracranial abnormality.  Laboratory data notable for an elevated troponin of 44.  As such patient discussed with hospitalist for  hospital admission further evaluation and management of near syncopal episode with elevated troponin.  Patient remained chest pain-free in the emergency department.  ____________________________________________  FINAL CLINICAL IMPRESSION(S) / ED DIAGNOSES  Final diagnoses:  Near syncope  Right forehead laceration   MEDICATIONS GIVEN DURING THIS VISIT:  Medications  aspirin chewable tablet 324 mg (has no administration in time range)     ED Discharge Orders    None      *Please note:  Michael Chang was evaluated in Emergency Department on 02/14/2020 for the symptoms described in the history of present illness. He was evaluated in the context of the global COVID-19 pandemic, which necessitated consideration that the patient might be at risk for infection with the SARS-CoV-2 virus that causes COVID-19.  Institutional protocols and algorithms that pertain to the evaluation of patients at risk for COVID-19 are in a state of rapid change based on information released by regulatory bodies including the CDC and federal and state organizations. These policies and algorithms were followed during the patient's care in the ED.  Some ED evaluations and interventions may be delayed as a result of limited staffing during the pandemic.*  Note:  This document was prepared using Dragon voice recognition software and may include unintentional dictation errors.   Gregor Hams, MD 02/14/20 2146

## 2020-02-14 NOTE — Consult Note (Signed)
Cardiology Consultation:   Patient ID: Michael Chang MRN: [id number]; DOB: [birth year]  Admit date: 02/14/2020 Date of Consult: 02/14/2020  Primary Care Provider: [name], NP Primary Cardiologist: Dr. [name] Reason for consult: Fall Physician requesting consult: Dr. Francine Graven    Patient Profile:   Michael Chang is a 74 y.o. male with a hx of coronary artery disease, ejection fraction 40 to 66%, chronic systolic diastolic CHF, chronic kidney disease, PAD, lower extremity ulcers secondary to ischemia, former alcohol quit 12 years ago, frequent falls/gait instability, anemia, presenting after fall   History of Present Illness:   Michael Chang reports that he has had several falls in the past month or so at home.  Last month reported getting up out of bed to walk to the bathroom fell going around a corner (similar circumstances as leading to his presentation today).  Also had recent fall in the bathroom.  Denies neuropathy but reports his legs are weak, has ulcers on his feet.  Typically uses a cane to get around, does not use a walker.  It is unclear if falls occurred when he was not using his cane  Reports living with his sister, she does most of his ADLs Otherwise very sedentary, legs getting progressively weak  On this admission reported he was getting up out of bed to go to the restroom lost his balance fell down hit his forehead Sister found him on the ground  When asked if he was dizzy, reports that he is to have dizziness when he was younger but does not have much dizziness recently -When asked him as to the mechanism of his fall, he reported his legs just came out from under him  In the emergency room bradycardic, systolic pressures ranging from 599 up to 357 systolic over 017 No orthostatics documented   Past Medical History:  Diagnosis Date  . AKI (acute kidney injury) ([place])   . CHF (congestive heart failure) ([place])   . Chronic ulcer of right lower extremity ([place])     . CKD (chronic kidney disease)   . NSTEMI (non-ST elevated myocardial infarction) ([place])   . PVD (peripheral vascular disease) ([place])     History reviewed. No pertinent surgical history.   Home Medications:  Prior to Admission medications   Medication Sig Start Date End Date Taking? Authorizing Provider  amLODipine (NORVASC) 5 MG tablet Take 1 tablet (5 mg total) by mouth in the morning and at bedtime. 01/15/20 04/14/20 Yes Wellington Hampshire, MD  aspirin EC 81 MG tablet Take 1 tablet (81 mg total) by mouth daily. 01/05/20  Yes Wellington Hampshire, MD  carvedilol (COREG) 12.5 MG tablet Take 1 tablet (12.5 mg total) by mouth 2 (two) times daily with a meal. 01/15/20  Yes Wellington Hampshire, MD  ferrous sulfate 325 (65 FE) MG tablet Take 325 mg by mouth daily with breakfast.   Yes [provider]  hydrALAZINE (APRESOLINE) 100 MG tablet Take 1 tablet (100 mg total) by mouth 2 (two) times daily. 01/15/20  Yes Wellington Hampshire, MD  isosorbide mononitrate (IMDUR) 60 MG 24 hr tablet Take 1 tablet (60 mg total) by mouth daily. For blood pressure. 02/04/20  Yes [name], NP  Multiple Vitamin (MULTIVITAMIN WITH MINERALS) TABS tablet Take 1 tablet by mouth daily.   Yes [provider]    Inpatient Medications: Scheduled Meds: . aspirin EC  81 mg Oral Daily  . carvedilol  12.5 mg Oral BID WC  . ferrous sulfate  325 mg Oral Q breakfast  . heparin  5,000 Units Subcutaneous Q8H  . hydrALAZINE  100 mg Oral BID  . isosorbide mononitrate  60 mg Oral Daily  . multivitamin with minerals  1 tablet Oral Daily  . sodium chloride flush  3 mL Intravenous Q12H  . sodium chloride flush  3 mL Intravenous Q12H   Continuous Infusions: . sodium chloride     PRN Meds: sodium chloride, acetaminophen **OR** acetaminophen, sodium chloride flush  Allergies:   No Known Allergies  Social History:   Social History   Socioeconomic History  . Marital status: Single    Spouse name: Not on file  .  Number of children: Not on file  . Years of education: Not on file  . Highest education level: Not on file  Occupational History  . Not on file  Tobacco Use  . Smoking status: Never Smoker  . Smokeless tobacco: Never Used  Substance and Sexual Activity  . Alcohol use: Not Currently  . Drug use: Not on file  . Sexual activity: Not on file  Other Topics Concern  . Not on file  Social History Narrative  . Not on file   Social Determinants of Health   Financial Resource Strain:   . Difficulty of Paying Living Expenses:   Food Insecurity:   . Worried About Charity fundraiser in the Last Year:   . Arboriculturist in the Last Year:   Transportation Needs:   . Film/video editor (Medical):   [name] Lack of Transportation (Non-Medical):   Physical Activity:   . Days of Exercise per Week:   . Minutes of Exercise per Session:   Stress:   . Feeling of Stress :   Social Connections:   . Frequency of Communication with Friends and Family:   . Frequency of Social Gatherings with Friends and Family:   . Attends Religious Services:   . Active Member of Clubs or Organizations:   . Attends Archivist Meetings:   [name] Marital Status:   Intimate Partner Violence:   . Fear of Current or Ex-Partner:   . Emotionally Abused:   [name] Physically Abused:   . Sexually Abused:     Family History:   History reviewed. No pertinent family history.   ROS:  Please see the history of present illness.  Review of Systems  Constitutional: Negative.   HENT: Negative.   Respiratory: Negative.   Cardiovascular: Negative.   Gastrointestinal: Negative.   Musculoskeletal: Positive for falls.       Weak legs, poor balance  Neurological: Negative.   Psychiatric/Behavioral: Negative.   All other systems reviewed and are negative.     Physical Exam/Data:   Vitals:   02/14/20 1030 02/14/20 1100 02/14/20 1130 02/14/20 1200  BP: 136/83 (!) 155/81 (!) 147/82 (!) 158/84  Pulse: (!) 57 (!) 56 60 (!)  56  Resp: 14  16   Temp:      TempSrc:      SpO2: 100% 100% 100% 99%  Weight:      Height:       No intake or output data in the 24 hours ending 02/14/20 1443 Last 3 Weights 02/14/2020 02/01/2020 01/07/2020  Weight (lbs) 141 lb 139 lb 8 oz 70 lb  Weight (kg) 63.957 kg 63.277 kg 31.752 kg     Body mass index is 19.67 kg/m.  General:  Well nourished, well developed, in no acute distress HEENT: normal Lymph: no adenopathy Neck:  no JVD Endocrine:  No thryomegaly Vascular: No carotid bruits; FA pulses 2+ bilaterally without bruits  Cardiac:  normal S1, S2; RRR; no murmur  Lungs:  clear to auscultation bilaterally, no wheezing, rhonchi or rales  Abd: soft, nontender, no hepatomegaly  Ext: no edema Musculoskeletal:  No deformities, BUE and BLE strength normal and equal Skin: warm and dry  Neuro:  CNs 2-12 intact, no focal abnormalities noted Psych:  Normal affect   EKG:  The EKG was personally reviewed and demonstrates:   Normal sinus rhythm with rate 61 bpm LVH unable to exclude old anterior MI  Telemetry:  Telemetry was personally reviewed and demonstrates: Normal sinus rhythm  Relevant CV Studies: Echocardiogram pending  Laboratory Data:  High Sensitivity Troponin:   Recent Labs  Lab 02/14/20 0247 02/14/20 0646  TROPONINIHS 44* 36*     Chemistry Recent Labs  Lab 02/14/20 0247  NA 139  K 3.7  CL 106  CO2 26  GLUCOSE 112*  BUN 39*  CREATININE 1.97*  CALCIUM 8.5*  GFRNONAA 33*  GFRAA 38*  ANIONGAP 7    Recent Labs  Lab 02/14/20 0247  PROT 8.3*  ALBUMIN 2.9*  AST 29  ALT 16  ALKPHOS 51  BILITOT 0.6   Hematology Recent Labs  Lab 02/14/20 0247  WBC 5.8  RBC 3.54*  HGB 8.8*  HCT 28.8*  MCV 81.4  MCH 24.9*  MCHC 30.6  RDW 22.0*  PLT 270   BNPNo results for input(s): BNP, PROBNP in the last 168 hours.  DDimer No results for input(s): DDIMER in the last 168 hours.   Radiology/Studies:  CT Head Wo Contrast  Result Date: 02/14/2020 CLINICAL  DATA:  Head trauma. EXAM: CT HEAD WITHOUT CONTRAST TECHNIQUE: Contiguous axial images were obtained from the base of the skull through the vertex without intravenous contrast. COMPARISON:  None. FINDINGS: Brain: Mild generalized age related parenchymal volume loss with commensurate dilatation of the ventricles and sulci. Chronic small vessel ischemic changes within the bilateral periventricular and subcortical white matter regions. No mass, hemorrhage, edema or other evidence of acute parenchymal abnormality. No extra-axial hemorrhage. Vascular: Chronic calcified atherosclerotic changes of the large vessels at the skull base. No unexpected hyperdense vessel. Skull: Normal. Negative for fracture or focal lesion. Sinuses/Orbits: Chronic appearing frontal sinus disease. Periorbital and retro-orbital soft tissues are unremarkable. Other: Scalp edema/laceration overlying the RIGHT frontal bone. No underlying fracture. IMPRESSION: 1. Scalp edema/laceration overlying the RIGHT frontal bone. No underlying fracture. 2. No acute intracranial abnormality. No intracranial mass, hemorrhage or edema. 3. Chronic small vessel ischemic changes in the white matter. Electronically Signed   By: Franki Cabot M.D.   On: 02/14/2020 04:26   DG Chest Port 1 View  Result Date: 02/14/2020 CLINICAL DATA:  Fall EXAM: PORTABLE CHEST 1 VIEW COMPARISON:  None. FINDINGS: The heart size and mediastinal contours are within normal limits. Both lungs are clear. The visualized skeletal structures are unremarkable. IMPRESSION: No active disease. Electronically Signed   By: Ulyses Jarred M.D.   On: 02/14/2020 03:47     Assessment and Plan:   1. Falls Etiology unclear, reports having a fall last month similar circumstances, On my review with him he is denying any dizziness and reports having mechanical falls Reports having a fall in the bathroom as well, legs give out -Reports he walks with a cane, does not have a walker -We will check  orthostatics but blood pressure has been running high -Blood pressure sometimes in the 50s on his  beta-blocker, but good perfusion pressure -Would recommend we have him work with PT OT, address fall risk -Echocardiogram pending -Would continue telemetry -He denies neuropathy but he has severe vascular disease, reports having nonhealing ulcerations on his feet.  This could potentially contribute to his unsteady gait -By his account he is very sedentary, sister does everything  2. PAD Lower extremity arterial disease, followed by Dr. [name] Aspirin, statin  3. CAD,  History of diastolic systolic heart failure Is not on diuretic as an outpatient Appears euvolemic  4.  Malignant hypertension On amlodipine, carvedilol, hydralazine, isosorbide Blood pressure continues to run high Additional titration of medications may be needed  5.  Acute on chronic renal failure Creatinine around 2, at his baseline Secondary to chronic poorly controlled hypertension  6.  Protein calorie malnutrition, moderate Very thin on exam, albumin 2.9 We will need to discuss with his sister  52.  Anemia Hemoglobin 8.8 Likely poor intake, may be iron deficient Albumin running lower, Will check iron studies Was low last month 8.9    Total encounter time more than 110 minutes  Greater than 50% was spent in counseling and coordination of care with the patient   For questions or updates, please contact [facility] Please consult [email or web address] for contact info under     Signed, Ida Rogue, MD  02/14/2020 2:43 PM

## 2020-02-14 NOTE — Progress Notes (Signed)
*  PRELIMINARY RESULTS* Echocardiogram 2D Echocardiogram has been performed.  Michael Chang 02/14/2020, 12:28 PM

## 2020-02-14 NOTE — ED Notes (Signed)
Pt taken to CT at this time.

## 2020-02-14 NOTE — ED Notes (Signed)
Pt taken to restroom, 1 unmeasured void

## 2020-02-14 NOTE — Progress Notes (Addendum)
Pt was admitted on the floor and noted that pt was not tested for covid. Notify Ouma NP. Will continue to monitor.  Update 2247: Ouma ordered covid test for pt. Will continue to monitor.

## 2020-02-14 NOTE — ED Notes (Addendum)
No pacemaker present  

## 2020-02-14 NOTE — ED Triage Notes (Signed)
Pt from home and brought to the ED via [facility] for a fall. Pt st he was attempting to walk to the restroom when all of a sudden he got dizzy and lost his balance.   Per pt's sister she had to arouse him in order to wake up. Pt has a 1" lac note to the top right forehead. Pt denies LOC.  Pt has a hx of cardiac events * last March MI with similar syncopal sx*

## 2020-02-15 ENCOUNTER — Ambulatory Visit (INDEPENDENT_AMBULATORY_CARE_PROVIDER_SITE_OTHER): Payer: Medicare Other | Admitting: Vascular Surgery

## 2020-02-15 DIAGNOSIS — R55 Syncope and collapse: Secondary | ICD-10-CM | POA: Diagnosis not present

## 2020-02-15 DIAGNOSIS — I10 Essential (primary) hypertension: Secondary | ICD-10-CM

## 2020-02-15 DIAGNOSIS — I739 Peripheral vascular disease, unspecified: Secondary | ICD-10-CM

## 2020-02-15 DIAGNOSIS — N1832 Chronic kidney disease, stage 3b: Secondary | ICD-10-CM | POA: Diagnosis not present

## 2020-02-15 DIAGNOSIS — R531 Weakness: Secondary | ICD-10-CM

## 2020-02-15 DIAGNOSIS — D500 Iron deficiency anemia secondary to blood loss (chronic): Secondary | ICD-10-CM | POA: Diagnosis not present

## 2020-02-15 DIAGNOSIS — D509 Iron deficiency anemia, unspecified: Secondary | ICD-10-CM

## 2020-02-15 LAB — IRON AND TIBC
Iron: 40 ug/dL — ABNORMAL LOW (ref 45–182)
Saturation Ratios: 17 % — ABNORMAL LOW (ref 17.9–39.5)
TIBC: 230 ug/dL — ABNORMAL LOW (ref 250–450)
UIBC: 190 ug/dL

## 2020-02-15 LAB — BASIC METABOLIC PANEL
Anion gap: 8 (ref 5–15)
BUN: 43 mg/dL — ABNORMAL HIGH (ref 8–23)
CO2: 26 mmol/L (ref 22–32)
Calcium: 8.6 mg/dL — ABNORMAL LOW (ref 8.9–10.3)
Chloride: 106 mmol/L (ref 98–111)
Creatinine, Ser: 2.06 mg/dL — ABNORMAL HIGH (ref 0.61–1.24)
GFR calc Af Amer: 36 mL/min — ABNORMAL LOW (ref >60)
GFR calc non Af Amer: 31 mL/min — ABNORMAL LOW (ref >60)
Glucose, Bld: 91 mg/dL (ref 70–99)
Potassium: 4.2 mmol/L (ref 3.5–5.1)
Sodium: 140 mmol/L (ref 135–145)

## 2020-02-15 LAB — CBC
HCT: 25.3 % — ABNORMAL LOW (ref 39.0–52.0)
Hemoglobin: 7.9 g/dL — ABNORMAL LOW (ref 13.0–17.0)
MCH: 24.5 pg — ABNORMAL LOW (ref 26.0–34.0)
MCHC: 31.2 g/dL (ref 30.0–36.0)
MCV: 78.3 fL — ABNORMAL LOW (ref 80.0–100.0)
Platelets: 254 10*3/uL (ref 150–400)
RBC: 3.23 MIL/uL — ABNORMAL LOW (ref 4.22–5.81)
RDW: 22.1 % — ABNORMAL HIGH (ref 11.5–15.5)
WBC: 4.7 10*3/uL (ref 4.0–10.5)
nRBC: 0 % (ref 0.0–0.2)

## 2020-02-15 LAB — FERRITIN: Ferritin: 41 ng/mL (ref 24–336)

## 2020-02-15 LAB — GLUCOSE, CAPILLARY: Glucose-Capillary: 88 mg/dL (ref 70–99)

## 2020-02-15 MED ORDER — AMLODIPINE BESYLATE 5 MG PO TABS: 5.0000 mg | ORAL_TABLET | Freq: Every day | ORAL | Status: DC

## 2020-02-15 MED ORDER — AMLODIPINE BESYLATE 5 MG PO TABS: 5.0000 mg | ORAL_TABLET | Freq: Every day | ORAL | 5 refills | Status: DC

## 2020-02-15 NOTE — Progress Notes (Signed)
Progress Note  Patient Name: Michael Chang Date of Encounter: 02/15/2020  Primary Cardiologist: Dr. [name], [facility]  Subjective   Denies any CP or issues with breathing.  Reports occasional palpitations and dizziness with exertion / ambulation, such as using the restroom.  Of note, he reports that he does not have a cardiologist in the area and used to follow with a cardiologist in [place]; however, on review of documentation, he appears to have had a new pt office visit with Dr. [name] 01/05/20.  Inpatient Medications    Scheduled Meds: . amLODipine  5 mg Oral Daily  . aspirin EC  81 mg Oral Daily  . carvedilol  12.5 mg Oral BID WC  . ferrous sulfate  325 mg Oral Q breakfast  . hydrALAZINE  100 mg Oral BID  . isosorbide mononitrate  60 mg Oral Daily  . multivitamin with minerals  1 tablet Oral Daily  . sodium chloride flush  3 mL Intravenous Q12H   Continuous Infusions: . sodium chloride     PRN Meds: sodium chloride, acetaminophen **OR** acetaminophen, sodium chloride flush   Vital Signs    Vitals:   02/15/20 1003 02/15/20 1005 02/15/20 1006 02/15/20 1008  BP: (!) 162/80 (!) 142/88 (!) 151/96 (!) 172/98  Pulse: 70 73 79 79  Resp: 19     Temp: 98.4 F (36.9 C)     TempSrc: Oral     SpO2:  100%    Weight:      Height:        Intake/Output Summary (Last 24 hours) at 02/15/2020 1102 Last data filed at 02/15/2020 1000 Gross per 24 hour  Intake 243 ml  Output 802 ml  Net -559 ml   Last 3 Weights 02/15/2020 02/14/2020 02/14/2020  Weight (lbs) 142 lb 3.2 oz 147 lb 6.4 oz 141 lb  Weight (kg) 64.501 kg 66.86 kg 63.957 kg      Telemetry    NSR, 50s-low 70s - Personally Reviewed  ECG    No new tracings- Personally Reviewed  Physical Exam   GEN: Thin male. No acute distress.   Neck: No JVD Cardiac: RRR, no murmurs, rubs, or gallops.  Respiratory: CTAB. GI: Soft, nontender, non-distended  MS: No edema; No deformity. Neuro:  Nonfocal  Psych: Normal affect    Labs    High Sensitivity Troponin:   Recent Labs  Lab 02/14/20 0247 02/14/20 0646 02/14/20 1500  TROPONINIHS 44* 36* 36*      Chemistry Recent Labs  Lab 02/14/20 0247 02/15/20 0516  NA 139 140  K 3.7 4.2  CL 106 106  CO2 26 26  GLUCOSE 112* 91  BUN 39* 43*  CREATININE 1.97* 2.06*  CALCIUM 8.5* 8.6*  PROT 8.3*  --   ALBUMIN 2.9*  --   AST 29  --   ALT 16  --   ALKPHOS 51  --   BILITOT 0.6  --   GFRNONAA 33* 31*  GFRAA 38* 36*  ANIONGAP 7 8     Hematology Recent Labs  Lab 02/14/20 0247 02/15/20 0516  WBC 5.8 4.7  RBC 3.54* 3.23*  HGB 8.8* 7.9*  HCT 28.8* 25.3*  MCV 81.4 78.3*  MCH 24.9* 24.5*  MCHC 30.6 31.2  RDW 22.0* 22.1*  PLT 270 254    BNPNo results for input(s): BNP, PROBNP in the last 168 hours.   DDimer No results for input(s): DDIMER in the last 168 hours.   Radiology    CT Head Wo Contrast  Result Date: 02/14/2020 CLINICAL DATA:  Head trauma. EXAM: CT HEAD WITHOUT CONTRAST TECHNIQUE: Contiguous axial images were obtained from the base of the skull through the vertex without intravenous contrast. COMPARISON:  None. FINDINGS: Brain: Mild generalized age related parenchymal volume loss with commensurate dilatation of the ventricles and sulci. Chronic small vessel ischemic changes within the bilateral periventricular and subcortical white matter regions. No mass, hemorrhage, edema or other evidence of acute parenchymal abnormality. No extra-axial hemorrhage. Vascular: Chronic calcified atherosclerotic changes of the large vessels at the skull base. No unexpected hyperdense vessel. Skull: Normal. Negative for fracture or focal lesion. Sinuses/Orbits: Chronic appearing frontal sinus disease. Periorbital and retro-orbital soft tissues are unremarkable. Other: Scalp edema/laceration overlying the RIGHT frontal bone. No underlying fracture. IMPRESSION: 1. Scalp edema/laceration overlying the RIGHT frontal bone. No underlying fracture. 2. No acute  intracranial abnormality. No intracranial mass, hemorrhage or edema. 3. Chronic small vessel ischemic changes in the white matter. Electronically Signed   By: Franki Cabot M.D.   On: 02/14/2020 04:26   DG Chest Port 1 View  Result Date: 02/14/2020 CLINICAL DATA:  Fall EXAM: PORTABLE CHEST 1 VIEW COMPARISON:  None. FINDINGS: The heart size and mediastinal contours are within normal limits. Both lungs are clear. The visualized skeletal structures are unremarkable. IMPRESSION: No active disease. Electronically Signed   By: Ulyses Jarred M.D.   On: 02/14/2020 03:47   ECHOCARDIOGRAM COMPLETE  Result Date: 02/14/2020    ECHOCARDIOGRAM REPORT   Patient Name:   Michael Chang Date of Exam: 02/14/2020 Medical Rec #:  [id number]      Height:       71.0 in Accession #:    [id number]     Weight:       141.0 lb Date of Birth:  Feb 17, 1946       BSA:          1.817 m Patient Age:    74 years       BP:           136/83 mmHg Patient Gender: M              HR:           56 bpm. Exam Location:  [facility] Procedure: 2D Echo Indications:     Syncope 780.2/ R55  History:         Patient has no prior history of Echocardiogram examinations.  Sonographer:     Arville Go RDCS Referring Phys:  [id number] AGBATA Diagnosing Phys: Ida Rogue MD IMPRESSIONS  1. Left ventricular ejection fraction, by estimation, is 55 to 60%. The left ventricle has normal function. The left ventricle has no regional wall motion abnormalities. There is severe left ventricular hypertrophy. Left ventricular diastolic parameters  are consistent with Grade I diastolic dysfunction (impaired relaxation).  2. Right ventricular systolic function is normal. The right ventricular size is normal.  3. Left atrial size was mildly dilated. FINDINGS  Left Ventricle: Left ventricular ejection fraction, by estimation, is 55 to 60%. The left ventricle has normal function. The left ventricle has no regional wall motion abnormalities. The left ventricular internal  cavity size was normal in size. There is  severe left ventricular hypertrophy. Left ventricular diastolic parameters are consistent with Grade I diastolic dysfunction (impaired relaxation). Right Ventricle: The right ventricular size is normal. No increase in right ventricular wall thickness. Right ventricular systolic function is normal. Left Atrium: Left atrial size was mildly dilated. Right Atrium: Right atrial size was normal in size.  Pericardium: There is no evidence of pericardial effusion. Mitral Valve: The mitral valve is normal in structure. Normal mobility of the mitral valve leaflets. No evidence of mitral valve regurgitation. No evidence of mitral valve stenosis. Tricuspid Valve: The tricuspid valve is normal in structure. Tricuspid valve regurgitation is mild . No evidence of tricuspid stenosis. Aortic Valve: The aortic valve is normal in structure. Aortic valve regurgitation is not visualized. No aortic stenosis is present. Aortic valve peak gradient measures 8.4 mmHg. Pulmonic Valve: The pulmonic valve was normal in structure. Pulmonic valve regurgitation is not visualized. No evidence of pulmonic stenosis. Aorta: The aortic root is normal in size and structure. Venous: The inferior vena cava is normal in size with greater than 50% respiratory variability, suggesting right atrial pressure of 3 mmHg. IAS/Shunts: No atrial level shunt detected by color flow Doppler.  LEFT VENTRICLE PLAX 2D LVIDd:         4.52 cm  Diastology LVIDs:         3.05 cm  LV e' lateral:   4.24 cm/s LV PW:         1.90 cm  LV E/e' lateral: 15.4 LV IVS:        2.31 cm  LV e' medial:    4.79 cm/s LVOT diam:     2.20 cm  LV E/e' medial:  13.6 LV SV:         73 LV SV Index:   40 LVOT Area:     3.80 cm  RIGHT VENTRICLE RV Basal diam:  3.21 cm RV S prime:     15.40 cm/s TAPSE (M-mode): 2.4 cm LEFT ATRIUM             Index       RIGHT ATRIUM           Index LA diam:        4.50 cm 2.48 cm/m  RA Area:     17.70 cm LA Vol (A2C):    59.1 ml 32.52 ml/m RA Volume:   45.70 ml  25.14 ml/m LA Vol (A4C):   66.1 ml 36.37 ml/m LA Biplane Vol: 68.6 ml 37.74 ml/m  AORTIC VALVE                PULMONIC VALVE AV Area (Vmax): 2.50 cm    PV Vmax:       1.05 m/s AV Vmax:        145.00 cm/s PV Peak grad:  4.4 mmHg AV Peak Grad:   8.4 mmHg LVOT Vmax:      95.30 cm/s LVOT Vmean:     60.300 cm/s LVOT VTI:       0.191 m  AORTA Ao Root diam: 3.10 cm Ao Asc diam:  3.20 cm MITRAL VALVE MV Area (PHT): 2.83 cm    SHUNTS MV Decel Time: 268 msec    Systemic VTI:  0.19 m MV E velocity: 65.10 cm/s  Systemic Diam: 2.20 cm MV A velocity: 87.00 cm/s MV E/A ratio:  0.75 Ida Rogue MD Electronically signed by Ida Rogue MD Signature Date/Time: 02/14/2020/4:20:34 PM    Final     Cardiac Studies   Echo 02/14/20 1. Left ventricular ejection fraction, by estimation, is 55 to 60%. The  left ventricle has normal function. The left ventricle has no regional  wall motion abnormalities. There is severe left ventricular hypertrophy.  Left ventricular diastolic parameters  are consistent with Grade I diastolic dysfunction (impaired relaxation).  2. Right ventricular systolic function is  normal. The right ventricular  size is normal.  3. Left atrial size was mildly dilated.   01/07/20 [place] Vacular LE Summary:  Resting bilateral ankle-brachial indexes may be unreliable due to likely  medial calcification.  Doppler waveforms and normal toe-brachial indices suggest normal arterial  perfusion to the bilateral lower extremities.   Patient Profile     74 y.o. male with a history of CAD, HFpEF, CKD, PAD, LE ulcers 2/2 ischemia, frequent falls / gait instability, anemia, and former alcohol use (quit 12 years earlier), and seen today for recent fall.   Assessment & Plan    Fall --Reports ongoing intermittent dizziness with exertion and intermittent palpitations. No falls since admission. Most recent fall occurred after going to the bathroom and reports that  his legs gave out.  --Consider PAD as below as contributing to his fall. Also considered is his history of HFpEF, anemia, and CAD. No signs of arrhythmia on telemetry.  --PT recommends a walker in the future.  --On discussion with PT today, he may require a walker in the future if ambulating further distances or feeling weak.  --Echo EF improved to 55-60% and NRWMA. Severe LVH, G1DD, LAE. As below, no indication for further ischemic workup at this time.  --Telemetry shows NSR and no arrhythmia.  Could consider a Zio monitor at discharge for further workup.  --Recommend continue follow-up as an outpatient.   Hypertension --BP continues to be sub-optimally controlled. Continue current medications and titrate as needed for BP control. He is currently on Coreg, amlodipine, hydralazine, and Imdur. Of note, he was also recently started on lasix as an outpatient, though this appears to have been discontinued at some point since his last visit, possible due to his current renal function and with patient euvolemic on exam.  Could consider RAS workup in the future if BP continues to be suboptimally controlled on current medications.  PAD --Consider as contributing to his unsteady gait. Followed closely as an outpatient. Continue ASA and statin. Recommend ongoing risk factor modification.   CAD --No CP or sx concerning for angina. He reports prior heart attack in [place]. Echo with nl EF. HS Tn minimally elevated and flat trending. Not consistent with ACS. EKG without acute ST/T changes. No indication for further ischemic workup this admission. Continue current medications including ASA, Coreg, and statin. Continue aggressive risk factor modification. No ACE/ARBARNI due to CKD.    HFpEF --Euvolemic on exam. As above, he was previously on lasix as an outpatient, though this appears to have since been discontinued, possibly due to his current renal function. Most recent echo as above with improved EF. Likely  hypertensive heart disease though it has been noted in the past that ICM cannot completely be excluded. Continue current medications.   Acute on chronic renal failure --Continue to monitor with daily BMET. Most recent Cr 2.06 with BUN 43. No ACE/ARB/ARNI as above. Renally dose medications. Was previously on lasix in the past but this appears to have since been discontinued, possibly due to his current renal function.   Anemia --Consider as contributing to his current weakness. Hgb 7.9 with HCT 25.3. Continue to monitor with daily CBC. Denies s/sx of acute bleeding. Per IM.     For questions or updates, please contact [place] Please consult [email or web address] for contact info under        Signed, Arvil Chaco, PA-C  02/15/2020, 11:02 AM

## 2020-02-15 NOTE — Plan of Care (Signed)
  Problem: Health Behavior/Discharge Planning: Goal: Ability to manage health-related needs will improve Outcome: Progressing   Problem: Clinical Measurements: Goal: Cardiovascular complication will be avoided Outcome: Progressing   Problem: Activity: Goal: Risk for activity intolerance will decrease Outcome: Progressing   Problem: Safety: Goal: Ability to remain free from injury will improve Outcome: Progressing   

## 2020-02-15 NOTE — Discharge Summary (Signed)
[facility] NAME: Michael Chang    MR#:  [id number]  DATE OF BIRTH:  1946/04/16  DATE OF ADMISSION:  02/14/2020 ADMITTING PHYSICIAN: Collier Bullock, MD  DATE OF DISCHARGE: 02/15/2020 12:30 PM  PRIMARY CARE PHYSICIAN: [name], NP    ADMISSION DIAGNOSIS:  Near syncope [R55]  DISCHARGE DIAGNOSIS:  Principal Problem:   Near syncope Active Problems:   PVD (peripheral vascular disease) (HCC)   Chronic kidney disease   Essential hypertension   Cardiomyopathy, ischemic   SECONDARY DIAGNOSIS:   Past Medical History:  Diagnosis Date  . AKI (acute kidney injury) ([place])   . CHF (congestive heart failure) ([place])   . Chronic ulcer of right lower extremity ([place])   . CKD (chronic kidney disease)   . NSTEMI (non-ST elevated myocardial infarction) ([place])   . PVD (peripheral vascular disease) [facility])     HOSPITAL COURSE:   1.  Near syncope and fall.  Patient stated he felt dizzy and felt like he was going to pass out.  Patient was not orthostatic today.  Sister states that blood pressure sometimes can go low in the evening.  Can cut back Norvasc to once a day during the day.  Continue other medication since blood pressure is high. 2.  Iron deficiency anemia.  Patient had a drop in hemoglobin since yesterday.  Patient states that he had a colonoscopy.  Sister states that it showed a few polyps.  I do not have record of this since it was done in [place].  I recommended the sister getting these records over to their primary care physician.  I offered IV iron patient refused.  He is on oral iron.  Refer to hematology as outpatient.  Hemoglobin 7.9 upon disposition.  Patient told me he is going home today.  Patient deferred any further work-up here. 3.  Chronic kidney disease stage IIIb.  Patient sister states he has a follow-up appointment with nephrology as outpatient. 4.  Essential hypertension cut back Norvasc to daily dosing and  continue other medications 5.  History of peripheral vascular disease and NSTEMI.  Holding aspirin with iron deficiency anemia. 6.  Patient told me he is going home today since he has an eye doctor appointment for cataracts and he needs to be able to see. 6.  Weakness.  Physical therapy recommended home with home health and a rolling walker which was prescribed  DISCHARGE CONDITIONS:   Fair  CONSULTS OBTAINED:  Treatment Team:  Minna Merritts, MD  DRUG ALLERGIES:  No Known Allergies  DISCHARGE MEDICATIONS:   Allergies as of 02/15/2020   No Known Allergies     Medication List    STOP taking these medications   aspirin EC 81 MG tablet     TAKE these medications   amLODipine 5 MG tablet Commonly known as: NORVASC Take 1 tablet (5 mg total) by mouth daily. What changed: when to take this   carvedilol 12.5 MG tablet Commonly known as: COREG Take 1 tablet (12.5 mg total) by mouth 2 (two) times daily with a meal.   ferrous sulfate 325 (65 FE) MG tablet Take 325 mg by mouth daily with breakfast.   hydrALAZINE 100 MG tablet Commonly known as: APRESOLINE Take 1 tablet (100 mg total) by mouth 2 (two) times daily.   isosorbide mononitrate 60 MG 24 hr tablet Commonly known as: IMDUR Take 1 tablet (60 mg total) by mouth daily. For blood pressure.  multivitamin with minerals Tabs tablet Take 1 tablet by mouth daily.            Durable Medical Equipment  (From admission, onward)         Start     Ordered   02/15/20 1138  For home use only DME Walker rolling  Once    Question Answer Comment  Walker: With 5 Inch Wheels   Patient needs a walker to treat with the following condition Unsteady gait      02/15/20 1138           DISCHARGE INSTRUCTIONS:   Follow-up PMD 5 days Refer to hematology for iron deficiency anemia for iron infusion  If you experience worsening of your admission symptoms, develop shortness of breath, life threatening emergency,  suicidal or homicidal thoughts you must seek medical attention immediately by calling 911 or calling your MD immediately  if symptoms less severe.  You Must read complete instructions/literature along with all the possible adverse reactions/side effects for all the Medicines you take and that have been prescribed to you. Take any new Medicines after you have completely understood and accept all the possible adverse reactions/side effects.   Please note  You were cared for by a hospitalist during your hospital stay. If you have any questions about your discharge medications or the care you received while you were in the hospital after you are discharged, you can call the unit and asked to speak with the hospitalist on call if the hospitalist that took care of you is not available. Once you are discharged, your primary care physician will handle any further medical issues. Please note that NO REFILLS for any discharge medications will be authorized once you are discharged, as it is imperative that you return to your primary care physician (or establish a relationship with a primary care physician if you do not have one) for your aftercare needs so that they can reassess your need for medications and monitor your lab values.    Today   CHIEF COMPLAINT:   Chief Complaint  Patient presents with  . Fall    HISTORY OF PRESENT ILLNESS:  Michael Chang  is a 74 y.o. male presented after a fall.   VITAL SIGNS:  Blood pressure (!) 172/98, pulse 79, temperature 98.4 F (36.9 C), temperature source Oral, resp. rate 19, height 5\' 11"  (1.803 m), weight 64.5 kg, SpO2 100 %.  I/O:    Intake/Output Summary (Last 24 hours) at 02/15/2020 1718 Last data filed at 02/15/2020 1000 Gross per 24 hour  Intake 243 ml  Output 802 ml  Net -559 ml     PHYSICAL EXAMINATION:  GENERAL:  74 y.o.-year-old patient lying in the bed with no acute distress.  EYES: Pupils equal, round, reactive to light and  accommodation. No scleral icterus. Extraocular muscles intact.  HEENT: Head atraumatic, normocephalic. Oropharynx and nasopharynx clear.   LUNGS: Normal breath sounds bilaterally, no wheezing, rales,rhonchi or crepitation. No use of accessory muscles of respiration.  CARDIOVASCULAR: S1, S2 normal. No murmurs, rubs, or gallops.  ABDOMEN: Soft, non-tender, non-distended. Bowel sounds present. No organomegaly or mass.  EXTREMITIES: No pedal edema, cyanosis, or clubbing.  NEUROLOGIC: Cranial nerves II through XII are intact. Muscle strength 5/5 in all extremities. Sensation intact. Gait not checked.  PSYCHIATRIC: The patient is alert and oriented x 3.  SKIN: No obvious rash, lesion, or ulcer.   DATA REVIEW:   CBC Recent Labs  Lab 02/15/20 0516  WBC 4.7  HGB  7.9*  HCT 25.3*  PLT 254    Chemistries  Recent Labs  Lab 02/14/20 0247 02/14/20 0247 02/15/20 0516  NA 139   < > 140  K 3.7   < > 4.2  CL 106   < > 106  CO2 26   < > 26  GLUCOSE 112*   < > 91  BUN 39*   < > 43*  CREATININE 1.97*   < > 2.06*  CALCIUM 8.5*   < > 8.6*  AST 29  --   --   ALT 16  --   --   ALKPHOS 51  --   --   BILITOT 0.6  --   --    < > = values in this interval not displayed.    Microbiology Results  Results for orders placed or performed during the hospital encounter of 02/14/20  SARS Coronavirus 2 by RT PCR (hospital order, performed in [facility] hospital lab) Nasopharyngeal Nasopharyngeal Swab     Status: None   Collection Time: 02/14/20 10:57 PM   Specimen: Nasopharyngeal Swab  Result Value Ref Range Status   SARS Coronavirus 2 NEGATIVE NEGATIVE Final    Comment: (NOTE) SARS-CoV-2 target nucleic acids are NOT DETECTED. The SARS-CoV-2 RNA is generally detectable in upper and lower respiratory specimens during the acute phase of infection. The lowest concentration of SARS-CoV-2 viral copies this assay can detect is 250 copies / mL. A negative result does not preclude SARS-CoV-2 infection and  should not be used as the sole basis for treatment or other patient management decisions.  A negative result may occur with improper specimen collection / handling, submission of specimen other than nasopharyngeal swab, presence of viral mutation(s) within the areas targeted by this assay, and inadequate number of viral copies (<250 copies / mL). A negative result must be combined with clinical observations, patient history, and epidemiological information. Fact Sheet for Patients:   StrictlyIdeas.no Fact Sheet for Healthcare Providers: BankingDealers.co.za This test is not yet approved or cleared  by the [place] FDA and has been authorized for detection and/or diagnosis of SARS-CoV-2 by FDA under an Emergency Use Authorization (EUA).  This EUA will remain in effect (meaning this test can be used) for the duration of the COVID-19 declaration under Section 564(b)(1) of the Act, 21 U.S.C. section 360bbb-3(b)(1), unless the authorization is terminated or revoked sooner. Performed at [facility], [address], [place], [place] [address]     RADIOLOGY:  CT Head Wo Contrast  Result Date: 02/14/2020 CLINICAL DATA:  Head trauma. EXAM: CT HEAD WITHOUT CONTRAST TECHNIQUE: Contiguous axial images were obtained from the base of the skull through the vertex without intravenous contrast. COMPARISON:  None. FINDINGS: Brain: Mild generalized age related parenchymal volume loss with commensurate dilatation of the ventricles and sulci. Chronic small vessel ischemic changes within the bilateral periventricular and subcortical white matter regions. No mass, hemorrhage, edema or other evidence of acute parenchymal abnormality. No extra-axial hemorrhage. Vascular: Chronic calcified atherosclerotic changes of the large vessels at the skull base. No unexpected hyperdense vessel. Skull: Normal. Negative for fracture or focal lesion. Sinuses/Orbits:  Chronic appearing frontal sinus disease. Periorbital and retro-orbital soft tissues are unremarkable. Other: Scalp edema/laceration overlying the RIGHT frontal bone. No underlying fracture. IMPRESSION: 1. Scalp edema/laceration overlying the RIGHT frontal bone. No underlying fracture. 2. No acute intracranial abnormality. No intracranial mass, hemorrhage or edema. 3. Chronic small vessel ischemic changes in the white matter. Electronically Signed   By: Cherlynn Kaiser  Enriqueta Shutter M.D.   On: 02/14/2020 04:26   DG Chest Port 1 View  Result Date: 02/14/2020 CLINICAL DATA:  Fall EXAM: PORTABLE CHEST 1 VIEW COMPARISON:  None. FINDINGS: The heart size and mediastinal contours are within normal limits. Both lungs are clear. The visualized skeletal structures are unremarkable. IMPRESSION: No active disease. Electronically Signed   By: Ulyses Jarred M.D.   On: 02/14/2020 03:47   ECHOCARDIOGRAM COMPLETE  Result Date: 02/14/2020    ECHOCARDIOGRAM REPORT   Patient Name:   MARIANA GOYTIA Date of Exam: 02/14/2020 Medical Rec #:  [id number]      Height:       71.0 in Accession #:    [id number]     Weight:       141.0 lb Date of Birth:  June 11, 1946       BSA:          1.817 m Patient Age:    4 years       BP:           136/83 mmHg Patient Gender: M              HR:           56 bpm. Exam Location:  [facility] Procedure: 2D Echo Indications:     Syncope 780.2/ R55  History:         Patient has no prior history of Echocardiogram examinations.  Sonographer:     Arville Go RDCS Referring Phys:  [id number] AGBATA Diagnosing Phys: Ida Rogue MD IMPRESSIONS  1. Left ventricular ejection fraction, by estimation, is 55 to 60%. The left ventricle has normal function. The left ventricle has no regional wall motion abnormalities. There is severe left ventricular hypertrophy. Left ventricular diastolic parameters  are consistent with Grade I diastolic dysfunction (impaired relaxation).  2. Right ventricular systolic function is normal. The right  ventricular size is normal.  3. Left atrial size was mildly dilated. FINDINGS  Left Ventricle: Left ventricular ejection fraction, by estimation, is 55 to 60%. The left ventricle has normal function. The left ventricle has no regional wall motion abnormalities. The left ventricular internal cavity size was normal in size. There is  severe left ventricular hypertrophy. Left ventricular diastolic parameters are consistent with Grade I diastolic dysfunction (impaired relaxation). Right Ventricle: The right ventricular size is normal. No increase in right ventricular wall thickness. Right ventricular systolic function is normal. Left Atrium: Left atrial size was mildly dilated. Right Atrium: Right atrial size was normal in size. Pericardium: There is no evidence of pericardial effusion. Mitral Valve: The mitral valve is normal in structure. Normal mobility of the mitral valve leaflets. No evidence of mitral valve regurgitation. No evidence of mitral valve stenosis. Tricuspid Valve: The tricuspid valve is normal in structure. Tricuspid valve regurgitation is mild . No evidence of tricuspid stenosis. Aortic Valve: The aortic valve is normal in structure. Aortic valve regurgitation is not visualized. No aortic stenosis is present. Aortic valve peak gradient measures 8.4 mmHg. Pulmonic Valve: The pulmonic valve was normal in structure. Pulmonic valve regurgitation is not visualized. No evidence of pulmonic stenosis. Aorta: The aortic root is normal in size and structure. Venous: The inferior vena cava is normal in size with greater than 50% respiratory variability, suggesting right atrial pressure of 3 mmHg. IAS/Shunts: No atrial level shunt detected by color flow Doppler.  LEFT VENTRICLE PLAX 2D LVIDd:         4.52 cm  Diastology LVIDs:  3.05 cm  LV e' lateral:   4.24 cm/s LV PW:         1.90 cm  LV E/e' lateral: 15.4 LV IVS:        2.31 cm  LV e' medial:    4.79 cm/s LVOT diam:     2.20 cm  LV E/e' medial:  13.6 LV  SV:         73 LV SV Index:   40 LVOT Area:     3.80 cm  RIGHT VENTRICLE RV Basal diam:  3.21 cm RV S prime:     15.40 cm/s TAPSE (M-mode): 2.4 cm LEFT ATRIUM             Index       RIGHT ATRIUM           Index LA diam:        4.50 cm 2.48 cm/m  RA Area:     17.70 cm LA Vol (A2C):   59.1 ml 32.52 ml/m RA Volume:   45.70 ml  25.14 ml/m LA Vol (A4C):   66.1 ml 36.37 ml/m LA Biplane Vol: 68.6 ml 37.74 ml/m  AORTIC VALVE                PULMONIC VALVE AV Area (Vmax): 2.50 cm    PV Vmax:       1.05 m/s AV Vmax:        145.00 cm/s PV Peak grad:  4.4 mmHg AV Peak Grad:   8.4 mmHg LVOT Vmax:      95.30 cm/s LVOT Vmean:     60.300 cm/s LVOT VTI:       0.191 m  AORTA Ao Root diam: 3.10 cm Ao Asc diam:  3.20 cm MITRAL VALVE MV Area (PHT): 2.83 cm    SHUNTS MV Decel Time: 268 msec    Systemic VTI:  0.19 m MV E velocity: 65.10 cm/s  Systemic Diam: 2.20 cm MV A velocity: 87.00 cm/s MV E/A ratio:  0.75 Ida Rogue MD Electronically signed by Ida Rogue MD Signature Date/Time: 02/14/2020/4:20:34 PM    Final     Management plans discussed with the patient, family and the patient told me he is going home today.  CODE STATUS:     Code Status Orders  (From admission, onward)         Start     Ordered   02/14/20 0738  Full code  Continuous     02/14/20 0740        Code Status History    This patient has a current code status but no historical code status.   Advance Care Planning Activity      TOTAL TIME TAKING CARE OF THIS PATIENT: 35 minutes.    Loletha Grayer M.D on 02/15/2020 at 5:18 PM  Between 7am to 6pm - Pager - [phone]  After 6pm go to [email or web address] - password [email or web address]  [facility]  CC: Primary care physician; [name], NP

## 2020-02-15 NOTE — Evaluation (Addendum)
Physical Therapy Evaluation Patient Details Name: Michael Chang MRN: [id number] DOB: Jan 18, 1946 Today's Date: 02/15/2020   History of Present Illness  Michael Chang is a 35yoM who [facility] after fall at home. Pt was getting OOB to go to [facility], became dizzy and unsteady. Pt report similar presentation with MI in March '20. PMH: CKD3, CAD, PAD c pedal c pedal ulcers, HTN, ICM c LVEF 40-45%, NSTEMI  Clinical Impression  Pt admitted with above diagnosis. Pt currently with functional limitations due to the deficits listed below (see "PT Problem List"). Pt in bed upon entry, agreeable to PT evaluation. Some demonstrated potential for mild cognitive impairment, as some point of history are incongruent with medical record. Pt denies being established with cardiology since moving down from [place] 2 months ago, however there is evidence he has seen Dr. Audelia Acton. Orthostatic vitals established, noted BP flat, but post AMB BP significantly elevated 180s SBP, then 160s SBP 1 minute later seated EOB. Pt has unsteadiness, good awareness of this, makes efforts to remain safe with use of hands, given a RW with which he is able to mobilize with supervision level assistance. Pt has no syncopal prodrome this date. Pt is very near his baseline for basic mobility. Pt encouraged to continue to use SPC with mobility, RW when needed for additional distance or stability challenge. Pt will benefit from skilled PT intervention to increase independence and safety with basic mobility in preparation for discharge to the venue listed below.      02/15/20 1057  Therapy Vitals  Patient Position (if appropriate) Orthostatic Vitals  Orthostatic Lying   BP- Lying 134/73  Pulse- Lying 67  Orthostatic Sitting  BP- Sitting 131/69  Pulse- Sitting 66  Orthostatic Standing at 0 minutes  BP- Standing at 0 minutes 135/81  Pulse- Standing at 0 minutes 75  Orthostatic Standing at 3 minutes  BP- Standing at 3 minutes 184/87 (post AMB,  recovers to 162/46mmHg seated +1 minute)      Follow Up Recommendations Home health PT    Equipment Recommendations  Rolling walker with 5" wheels    Recommendations for Other Services       Precautions / Restrictions Precautions Precautions: Fall Restrictions Weight Bearing Restrictions: No      Mobility  Bed Mobility Overal bed mobility: Modified Independent                Transfers Overall transfer level: Needs assistance Equipment used: None Transfers: Sit to/from Stand Sit to Stand: Supervision;Min guard         General transfer comment: does not have SPC available at eval; appears safe with RW, but has visual imapirment that limit potential safe use at home.  Ambulation/Gait   Gait Distance (Feet): 260 Feet Assistive device: Rolling walker (2 wheeled) Gait Pattern/deviations: WFL(Within Functional Limits);Trunk flexed Gait velocity: 0.53m/s      Stairs            Wheelchair Mobility    Modified Rankin (Stroke Patients Only)       Balance Overall balance assessment: History of Falls;Mild deficits observed, not formally tested;Modified Independent                                           Pertinent Vitals/Pain Pain Assessment: No/denies pain    Home Living Family/patient expects to be discharged to:: Private residence Living Arrangements: Other relatives(Sister) Available  Help at Discharge: Family Type of Home: House Home Access: Stairs to enter   [employer] of Steps: 1 Home Layout: Bed/bath upstairs;Two level Home Equipment: Cane - single point;Shower seat      Prior Function Level of Independence: Independent with assistive device(s)         Comments: household distance AMB c SPC (x4 months);     Hand Dominance        Extremity/Trunk Assessment   Upper Extremity Assessment Upper Extremity Assessment: Overall WFL for tasks assessed    Lower Extremity Assessment Lower Extremity  Assessment: Overall WFL for tasks assessed    Cervical / Trunk Assessment Cervical / Trunk Assessment: Normal  Communication      Cognition Arousal/Alertness: Awake/alert Behavior During Therapy: WFL for tasks assessed/performed Overall Cognitive Status: Within Functional Limits for tasks assessed                                        General Comments      Exercises     Assessment/Plan    PT Assessment Patient needs continued PT services  PT Problem List Decreased strength;Decreased activity tolerance;Decreased balance;Decreased mobility;Decreased knowledge of precautions;Decreased safety awareness;Cardiopulmonary status limiting activity       PT Treatment Interventions DME instruction;Balance training;Gait training;Stair training;Functional mobility training;Therapeutic activities;Therapeutic exercise;Patient/family education    PT Goals (Current goals can be found in the Care Plan section)  Acute Rehab PT Goals Patient Stated Goal: return to home, improve confidence in mobility PT Goal Formulation: With patient Time For Goal Achievement: 02/29/20 Potential to Achieve Goals: Fair    Frequency Min 2X/week   Barriers to discharge        Co-evaluation               AM-PAC PT "6 Clicks" Mobility  Outcome Measure Help needed turning from your back to your side while in a flat bed without using bedrails?: None Help needed moving from lying on your back to sitting on the side of a flat bed without using bedrails?: None Help needed moving to and from a bed to a chair (including a wheelchair)?: A Little Help needed standing up from a chair using your arms (e.g., wheelchair or bedside chair)?: A Little Help needed to walk in hospital room?: A Little Help needed climbing 3-5 steps with a railing? : A Little 6 Click Score: 20    End of Session Equipment Utilized During Treatment: Gait belt Activity Tolerance: Patient tolerated treatment well;Patient  limited by fatigue Patient left: in bed;with call bell/phone within reach Nurse Communication: Mobility status PT Visit Diagnosis: Unsteadiness on feet (R26.81);Difficulty in walking, not elsewhere classified (R26.2);History of falling (Z91.81);Other abnormalities of gait and mobility (R26.89)    Time: 6237-6283 PT Time Calculation (min) (ACUTE ONLY): 33 min   Charges:   PT Evaluation $PT Eval Moderate Complexity: 1 Mod PT Treatments $Therapeutic Exercise: 23-37 mins        12:19 PM, 02/15/20 Etta Grandchild, PT, DPT Physical Therapist - [facility]  [phone] ([place])    Nebo C 02/15/2020, 12:13 PM

## 2020-02-17 ENCOUNTER — Ambulatory Visit (INDEPENDENT_AMBULATORY_CARE_PROVIDER_SITE_OTHER): Payer: Medicare Other

## 2020-02-17 ENCOUNTER — Telehealth: Payer: Self-pay | Admitting: Cardiovascular Disease

## 2020-02-17 VITALS — Ht 71.0 in | Wt 139.5 lb

## 2020-02-17 DIAGNOSIS — Z0000 Encounter for general adult medical examination without abnormal findings: Secondary | ICD-10-CM

## 2020-02-17 NOTE — Patient Instructions (Addendum)
Michael Chang , Thank you for taking time to come for your Medicare Wellness Visit. I appreciate your ongoing commitment to your health goals. Please review the following plan we discussed and let me know if I can assist you in the future.   Screening recommendations/referrals: Colonoscopy: Up to date, completed 11/30/2018 per patient  Recommended yearly ophthalmology/optometry visit for glaucoma screening and checkup Recommended yearly dental visit for hygiene and checkup  Vaccinations: Influenza vaccine: Fall 2021 Pneumococcal vaccine: decline Tdap vaccine: decline Shingles vaccine: decline    Advanced directives: Advance directive discussed with you today. Even though you declined this today please call our office should you change your mind and we can give you the proper paperwork for you to fill out.   Conditions/risks identified: hypertension  Next appointment: 02/18/2020 @ 10:20 am   Preventive Care 65 Years and Older, Male Preventive care refers to lifestyle choices and visits with your health care provider that can promote health and wellness. What does preventive care include?  A yearly physical exam. This is also called an annual well check.  Dental exams once or twice a year.  Routine eye exams. Ask your health care provider how often you should have your eyes checked.  Personal lifestyle choices, including:  Daily care of your teeth and gums.  Regular physical activity.  Eating a healthy diet.  Avoiding tobacco and drug use.  Limiting alcohol use.  Practicing safe sex.  Taking low doses of aspirin every day.  Taking vitamin and mineral supplements as recommended by your health care provider. What happens during an annual well check? The services and screenings done by your health care provider during your annual well check will depend on your age, overall health, lifestyle risk factors, and family history of disease. Counseling  Your health care provider may  ask you questions about your:  Alcohol use.  Tobacco use.  Drug use.  Emotional well-being.  Home and relationship well-being.  Sexual activity.  Eating habits.  History of falls.  Memory and ability to understand (cognition).  Work and work Statistician. Screening  You may have the following tests or measurements:  Height, weight, and BMI.  Blood pressure.  Lipid and cholesterol levels. These may be checked every 5 years, or more frequently if you are over 74 years old.  Skin check.  Lung cancer screening. You may have this screening every year starting at age 74 if you have a 30-pack-year history of smoking and currently smoke or have quit within the past 15 years.  Fecal occult blood test (FOBT) of the stool. You may have this test every year starting at age 74.  Flexible sigmoidoscopy or colonoscopy. You may have a sigmoidoscopy every 5 years or a colonoscopy every 10 years starting at age 74.  Prostate cancer screening. Recommendations will vary depending on your family history and other risks.  Hepatitis C blood test.  Hepatitis B blood test.  Sexually transmitted disease (STD) testing.  Diabetes screening. This is done by checking your blood sugar (glucose) after you have not eaten for a while (fasting). You may have this done every 1-3 years.  Abdominal aortic aneurysm (AAA) screening. You may need this if you are a current or former smoker.  Osteoporosis. You may be screened starting at age 74 if you are at high risk. Talk with your health care provider about your test results, treatment options, and if necessary, the need for more tests. Vaccines  Your health care provider may recommend certain vaccines,  such as:  Influenza vaccine. This is recommended every year.  Tetanus, diphtheria, and acellular pertussis (Tdap, Td) vaccine. You may need a Td booster every 10 years.  Zoster vaccine. You may need this after age 74.  Pneumococcal 13-valent  conjugate (PCV13) vaccine. One dose is recommended after age 74.  Pneumococcal polysaccharide (PPSV23) vaccine. One dose is recommended after age 74. Talk to your health care provider about which screenings and vaccines you need and how often you need them. This information is not intended to replace advice given to you by your health care provider. Make sure you discuss any questions you have with your health care provider. Document Released: 10/14/2015 Document Revised: 06/06/2016 Document Reviewed: 07/19/2015 Elsevier Interactive Patient Education  2017 [place] Prevention in the Home Falls can cause injuries. They can happen to people of all ages. There are many things you can do to make your home safe and to help prevent falls. What can I do on the outside of my home?  Regularly fix the edges of walkways and driveways and fix any cracks.  Remove anything that might make you trip as you walk through a door, such as a raised step or threshold.  Trim any bushes or trees on the path to your home.  Use bright outdoor lighting.  Clear any walking paths of anything that might make someone trip, such as rocks or tools.  Regularly check to see if handrails are loose or broken. Make sure that both sides of any steps have handrails.  Any raised decks and porches should have guardrails on the edges.  Have any leaves, snow, or ice cleared regularly.  Use sand or salt on walking paths during winter.  Clean up any spills in your garage right away. This includes oil or grease spills. What can I do in the bathroom?  Use night lights.  Install grab bars by the toilet and in the tub and shower. Do not use towel bars as grab bars.  Use non-skid mats or decals in the tub or shower.  If you need to sit down in the shower, use a plastic, non-slip stool.  Keep the floor dry. Clean up any water that spills on the floor as soon as it happens.  Remove soap buildup in the tub or  shower regularly.  Attach bath mats securely with double-sided non-slip rug tape.  Do not have throw rugs and other things on the floor that can make you trip. What can I do in the bedroom?  Use night lights.  Make sure that you have a light by your bed that is easy to reach.  Do not use any sheets or blankets that are too big for your bed. They should not hang down onto the floor.  Have a firm chair that has side arms. You can use this for support while you get dressed.  Do not have throw rugs and other things on the floor that can make you trip. What can I do in the kitchen?  Clean up any spills right away.  Avoid walking on wet floors.  Keep items that you use a lot in easy-to-reach places.  If you need to reach something above you, use a strong step stool that has a grab bar.  Keep electrical cords out of the way.  Do not use floor polish or wax that makes floors slippery. If you must use wax, use non-skid floor wax.  Do not have throw rugs and other things on  the floor that can make you trip. What can I do with my stairs?  Do not leave any items on the stairs.  Make sure that there are handrails on both sides of the stairs and use them. Fix handrails that are broken or loose. Make sure that handrails are as long as the stairways.  Check any carpeting to make sure that it is firmly attached to the stairs. Fix any carpet that is loose or worn.  Avoid having throw rugs at the top or bottom of the stairs. If you do have throw rugs, attach them to the floor with carpet tape.  Make sure that you have a light switch at the top of the stairs and the bottom of the stairs. If you do not have them, ask someone to add them for you. What else can I do to help prevent falls?  Wear shoes that:  Do not have high heels.  Have rubber bottoms.  Are comfortable and fit you well.  Are closed at the toe. Do not wear sandals.  If you use a stepladder:  Make sure that it is fully  opened. Do not climb a closed stepladder.  Make sure that both sides of the stepladder are locked into place.  Ask someone to hold it for you, if possible.  Clearly mark and make sure that you can see:  Any grab bars or handrails.  First and last steps.  Where the edge of each step is.  Use tools that help you move around (mobility aids) if they are needed. These include:  Canes.  Walkers.  Scooters.  Crutches.  Turn on the lights when you go into a dark area. Replace any light bulbs as soon as they burn out.  Set up your furniture so you have a clear path. Avoid moving your furniture around.  If any of your floors are uneven, fix them.  If there are any pets around you, be aware of where they are.  Review your medicines with your doctor. Some medicines can make you feel dizzy. This can increase your chance of falling. Ask your doctor what other things that you can do to help prevent falls. This information is not intended to replace advice given to you by your health care provider. Make sure you discuss any questions you have with your health care provider. Document Released: 07/14/2009 Document Revised: 02/23/2016 Document Reviewed: 10/22/2014 Elsevier Interactive Patient Education  2017 [employer].

## 2020-02-17 NOTE — Telephone Encounter (Signed)
   [facility] Pre-operative Risk Assessment    HEARTCARE STAFF: - Please ensure there is not already an duplicate clearance open for this procedure - Under Visit Info/Reason for Call, type in Other and utilize the format Clearance MM/DD/YY or Clearance TBD  Request for surgical clearance:  1. What type of surgery is being performed? Cataract extraction with intraocular lens implantation of the right eye followed by the left eye  2. When is this surgery scheduled? TBD - ASAP  3. What type of clearance is required (medical clearance vs. Pharmacy clearance to hold med vs. Both)? medical  4. Are there any medications that need to be held prior to surgery and how long? None, states medication does not need to be stopped  5. Practice name and name of physician performing surgery? [facility]  6. What is the office phone number? [phone]   7.   What is the office fax number?  [phone]  8.   Anesthesia type (None, local, MAC, general) ? Topical anesthesia with IV medication    Ace Gins 02/17/2020, 3:22 PM  _________________________________________________________________   (provider comments below)

## 2020-02-17 NOTE — Progress Notes (Signed)
PCP notes:  Health Maintenance: Declined all vaccines   Abnormal Screenings: MMSE- refused   Patient concerns: none   Nurse concerns: none   Next PCP appt.: 02/18/2020 @ 10:20 am

## 2020-02-17 NOTE — Progress Notes (Addendum)
Subjective:   Michael Chang is a 74 y.o. male who presents for an Initial Medicare Annual Wellness Visit.  Review of Systems: N/A    I connected with the patient today by telephone and verified that I am speaking with the correct person using two identifiers. Location patient: home Location nurse: work Persons participating in the virtual visit: patient, Michael Chang (sister) and nurse.   I discussed the limitations, risks, security and privacy concerns of performing an evaluation and management service by telephone and the availability of in person appointments. I also discussed with the patient that there may be a patient responsible charge related to this service. The patient expressed understanding and verbally consented to this telephonic visit.    Interactive audio and video telecommunications were attempted between this nurse and patient, however failed, due to patient having technical difficulties OR patient did not have access to video capability.  We continued and completed visit with audio only.     Cardiac Risk Factors include: advanced age (>56men, >78 women);male gender;hypertension    Objective:    Today's Vitals   02/17/20 1054 02/17/20 1121  Weight: 139 lb 8 oz (63.3 kg)   Height: 5\' 11"  (1.803 m)   PainSc:  4    Body mass index is 19.46 kg/m.  Advanced Directives 02/14/2020  Does Patient Have a Medical Advance Directive? No  Would patient like information on creating a medical advance directive? No - Patient declined    Current Medications (verified) Outpatient Encounter Medications as of 02/17/2020  Medication Sig  . amLODipine (NORVASC) 5 MG tablet Take 1 tablet (5 mg total) by mouth daily.  . carvedilol (COREG) 12.5 MG tablet Take 1 tablet (12.5 mg total) by mouth 2 (two) times daily with a meal.  . ferrous sulfate 325 (65 FE) MG tablet Take 325 mg by mouth daily with breakfast.  . hydrALAZINE (APRESOLINE) 100 MG tablet Take 1 tablet (100 mg total)  by mouth 2 (two) times daily.  . isosorbide mononitrate (IMDUR) 60 MG 24 hr tablet Take 1 tablet (60 mg total) by mouth daily. For blood pressure.  . Multiple Vitamin (MULTIVITAMIN WITH MINERALS) TABS tablet Take 1 tablet by mouth daily.   No facility-administered encounter medications on file as of 02/17/2020.    Allergies (verified) Patient has no known allergies.   History: Past Medical History:  Diagnosis Date  . AKI (acute kidney injury) ([place])   . CHF (congestive heart failure) ([place])   . Chronic ulcer of right lower extremity ([place])   . CKD (chronic kidney disease)   . NSTEMI (non-ST elevated myocardial infarction) ([place])   . PVD (peripheral vascular disease) ([place])    History reviewed. No pertinent surgical history. History reviewed. No pertinent family history. Social History   Socioeconomic History  . Marital status: Single    Spouse name: Not on file  . Number of children: Not on file  . Years of education: Not on file  . Highest education level: Not on file  Occupational History  . Not on file  Tobacco Use  . Smoking status: Never Smoker  . Smokeless tobacco: Never Used  Substance and Sexual Activity  . Alcohol use: Not Currently  . Drug use: Not on file  . Sexual activity: Not on file  Other Topics Concern  . Not on file  Social History Narrative  . Not on file   Social Determinants of Health   Financial Resource Strain: Low Risk   . Difficulty of Paying Living  Expenses: Not hard at all  Food Insecurity: No Food Insecurity  . Worried About Charity fundraiser in the Last Year: Never true  . Ran Out of Food in the Last Year: Never true  Transportation Needs: No Transportation Needs  . Lack of Transportation (Medical): No  . Lack of Transportation (Non-Medical): No  Physical Activity: Inactive  . Days of Exercise per Week: 0 days  . Minutes of Exercise per Session: 0 min  Stress: No Stress Concern Present  . Feeling of Stress : Not at all  Social  Connections:   . Frequency of Communication with Friends and Family:   . Frequency of Social Gatherings with Friends and Family:   . Attends Religious Services:   . Active Member of Clubs or Organizations:   . Attends Archivist Meetings:   [name] Marital Status:    Tobacco Counseling Counseling given: Not Answered   Clinical Intake:  Pre-visit preparation completed: Yes  Pain : 0-10 Pain Score: 4  Pain Type: Acute pain Pain Location: Neck Pain Descriptors / Indicators: Aching Pain Onset: 1 to 4 weeks ago     Nutritional Risks: None Diabetes: No  How often do you need to have someone help you when you read instructions, pamphlets, or other written materials from your doctor or pharmacy?: 1 - Never  Interpreter Needed?: No  Information entered by :: [place], LPN  Activities of Daily Living In your present state of health, do you have any difficulty performing the following activities: 02/17/2020 02/14/2020  Hearing? N Y  Vision? Y Y  Comment trouble with his hearing -  Difficulty concentrating or making decisions? N N  Walking or climbing stairs? N Y  Dressing or bathing? N Y  Doing errands, shopping? [place] Donning  Preparing Food and eating ? N -  Using the Toilet? N -  In the past six months, have you accidently leaked urine? N -  Do you have problems with loss of bowel control? N -  Managing your Medications? N -  Managing your Finances? N -  Housekeeping or managing your Housekeeping? N -     Immunizations and Health Maintenance  There is no immunization history on file for this patient. Health Maintenance Due  Topic Date Due  . Hepatitis C Screening  Never done  . COVID-19 Vaccine (1) Never done    Patient Care Team: [name], NP as PCP - General (Internal Medicine)  Indicate any recent Medical Services you may have received from other than [facility] providers in the past year (date may be approximate).    Assessment:   This is a routine wellness  examination for Michael Chang.  Hearing/Vision screen  Hearing Screening   125Hz  250Hz  500Hz  1000Hz  2000Hz  3000Hz  4000Hz  6000Hz  8000Hz   Right ear:           Left ear:           Vision Screening Comments: Patient gets annual eye exams   Dietary issues and exercise activities discussed: Current Exercise Habits: The patient does not participate in regular exercise at present, Exercise limited by: None identified  Goals    . Patient Stated     02/17/2020,  I will maintain and continue medications as prescribed.       Depression Screen PHQ 2/9 Scores 02/17/2020  PHQ - 2 Score 0  PHQ- 9 Score 0    Fall Risk Fall Risk  02/17/2020  Falls in the past year? 1  Comment passed out  Number falls in past yr: 1  Injury with Fall? 1  Comment head injury  Risk for fall due to : Impaired balance/gait;History of fall(s);Medication side effect;Impaired vision  Follow up Falls evaluation completed;Falls prevention discussed    Is the patient's home free of loose throw rugs in walkways, pet beds, electrical cords, etc?   yes      Grab bars in the bathroom? yes      Handrails on the stairs?   yes      Adequate lighting?   yes  Timed Get Up and Go performed: N/A  Cognitive Function: MMSE - Mini Mental State Exam 02/17/2020  Not completed: Unable to complete       Mini Cog  Mini-Cog screen was not completed. Patient refused and was very agitated on this call. Maximum score is 22. A value of 0 denotes this part of the MMSE was not completed or the patient failed this part of the Mini-Cog screening.  Screening Tests Health Maintenance  Topic Date Due  . Hepatitis C Screening  Never done  . COVID-19 Vaccine (1) Never done  . TETANUS/TDAP  02/16/2021 (Originally 04/03/1965)  . PNA vac Low Risk Adult (1 of 2 - PCV13) 02/17/2024 (Originally 04/04/2011)  . INFLUENZA VACCINE  05/01/2020  . COLONOSCOPY  11/29/2028    Qualifies for Shingles Vaccine: Yes  Cancer Screenings: Lung: Low Dose CT Chest  recommended if Age 64-80 years, 30 pack-year currently smoking OR have quit w/in 15 years. Patient does not qualify. Colorectal: completed 11/30/2018 per patient   Additional Screenings:  Hepatitis C Screening: due      Plan:    Patient will maintain and continue medications as prescribed.   I have personally reviewed and noted the following in the patient's chart:   . Medical and social history . Use of alcohol, tobacco or illicit drugs  . Current medications and supplements . Functional ability and status . Nutritional status . Physical activity . Advanced directives . List of other physicians . Hospitalizations, surgeries, and ER visits in previous 12 months . Vitals . Screenings to include cognitive, depression, and falls . Referrals and appointments  In addition, I have reviewed and discussed with patient certain preventive protocols, quality metrics, and best practice recommendations. A written personalized care plan for preventive services as well as general preventive health recommendations were provided to patient.     Andrez Grime, LPN   0/32/1224

## 2020-02-18 ENCOUNTER — Other Ambulatory Visit: Payer: Self-pay

## 2020-02-18 ENCOUNTER — Encounter: Payer: Self-pay | Admitting: Primary Care

## 2020-02-18 ENCOUNTER — Ambulatory Visit (INDEPENDENT_AMBULATORY_CARE_PROVIDER_SITE_OTHER): Payer: Medicare Other | Admitting: Primary Care

## 2020-02-18 ENCOUNTER — Telehealth: Payer: Self-pay

## 2020-02-18 VITALS — BP 180/100 | HR 65 | Temp 97.2°F | Ht 71.0 in | Wt 139.5 lb

## 2020-02-18 DIAGNOSIS — D509 Iron deficiency anemia, unspecified: Secondary | ICD-10-CM | POA: Diagnosis not present

## 2020-02-18 DIAGNOSIS — I10 Essential (primary) hypertension: Secondary | ICD-10-CM

## 2020-02-18 DIAGNOSIS — R55 Syncope and collapse: Secondary | ICD-10-CM

## 2020-02-18 DIAGNOSIS — N183 Chronic kidney disease, stage 3 unspecified: Secondary | ICD-10-CM

## 2020-02-18 DIAGNOSIS — R531 Weakness: Secondary | ICD-10-CM | POA: Diagnosis not present

## 2020-02-18 DIAGNOSIS — I5022 Chronic systolic (congestive) heart failure: Secondary | ICD-10-CM

## 2020-02-18 LAB — CBC
HCT: 28.3 % — ABNORMAL LOW (ref 39.0–52.0)
Hemoglobin: 9.2 g/dL — ABNORMAL LOW (ref 13.0–17.0)
MCHC: 32.5 g/dL (ref 30.0–36.0)
MCV: 79.6 fl (ref 78.0–100.0)
Platelets: 278 10*3/uL (ref 150.0–400.0)
RBC: 3.56 Mil/uL — ABNORMAL LOW (ref 4.22–5.81)
RDW: 23.9 % — ABNORMAL HIGH (ref 11.5–15.5)
WBC: 6 10*3/uL (ref 4.0–10.5)

## 2020-02-18 LAB — IBC + FERRITIN
Ferritin: 32.5 ng/mL (ref 22.0–322.0)
Iron: 38 ug/dL — ABNORMAL LOW (ref 42–165)
Saturation Ratios: 12.5 % — ABNORMAL LOW (ref 20.0–50.0)
Transferrin: 218 mg/dL (ref 212.0–360.0)

## 2020-02-18 NOTE — Assessment & Plan Note (Signed)
Recent hospital admission for near syncope, blood pressure fluctuation during hospital stay. He is anemic with drop since last check. Also with CKD.  Continue reduced dose of amlodipine to 5 mg daily. Continue other BP mediations as prescribed. His evening BP readings are mostly in the 130-150/70's range.   Referral placed to home health PT. He will follow up with nephrology as scheduled, consider hematology evaluation once evaluated by nephrology.   Today he appears stable and well overall. Back to baseline. Hospital labs, notes, imaging reviewed.

## 2020-02-18 NOTE — Assessment & Plan Note (Signed)
Continued fluctuation in BP, evening readings are overall better than morning readings. Continue current regimen. No changes made today. He will see cardiology in early June.

## 2020-02-18 NOTE — Telephone Encounter (Signed)
Michael Chang with [facility] left v/m requesting verbal orders for [facility] start of care on 02/22/20 at pt request.

## 2020-02-18 NOTE — Progress Notes (Signed)
Subjective:    Patient ID: Michael Chang, male    DOB: [birth year], 74 y.o.   MRN: [id number]  HPI  This visit occurred during the SARS-CoV-2 public health emergency.  Safety protocols were in place, including screening questions prior to the visit, additional usage of staff PPE, and extensive cleaning of exam room while observing appropriate contact time as indicated for disinfecting solutions.   Michael Chang is a 74 year old male with a history of hypertension, CHF, PVD, CAD, chronic lower extremity ulcers, iron deficiency anemia who presents today for hospital follow up.  He presented to [facility] ED with his sister on 02/14/20 for syncope. He was rising from bed to use the bathroom when he became dizzy and lost his balance. No loss of consciousness. During his stay in the ED he underwent CT head which was negative. He did have an elevated troponin of 44, but ECG without acute ischemia or infarction. Also with anemia and hemoglobin level of 7.9He sustained a right forehead laceration which was repaired. He was admitted for further evaluation.  During his hospital stay he underwent Echocardiogram which showed improvement to LVEF of 55-60%, but severe LVH. Carvedilol was held due to bradycardia. He was treated by PT/OT. He was evaluated by cardiology who did not believe symptoms or labs were suggestive of ACS. He was discharged home on 02/15/20 with recommendations to reduce amlodipine to 5 mg once daily, IV iron but patient refused, referral to hematology, [place] physical therapy.   Since his discharge home he has obtained his walker. He has an appointment with nephrology on June 2nd, and also an appointment with cardiology in June. He is compliant to his oral iron for which he's been taking for the last one month. His blood pressures continue to run 130's-180's/70's-90's. He denies chest pain, shortness of breath, dizziness, near syncope.   BP Readings from Last 3 Encounters:  02/18/20 (!) 180/100   02/15/20 (!) 172/98  02/01/20 (!) 200/100     Review of Systems  Eyes: Negative for visual disturbance.  Respiratory: Negative for shortness of breath.   Cardiovascular: Negative for chest pain.  Gastrointestinal: Negative for blood in stool.  Neurological: Negative for dizziness and headaches.       Past Medical History:  Diagnosis Date  . AKI (acute kidney injury) ([place])   . CHF (congestive heart failure) ([place])   . Chronic ulcer of right lower extremity ([place])   . CKD (chronic kidney disease)   . NSTEMI (non-ST elevated myocardial infarction) ([place])   . PVD (peripheral vascular disease) ([place])      Social History   Socioeconomic History  . Marital status: Single    Spouse name: Not on file  . Number of children: Not on file  . Years of education: Not on file  . Highest education level: Not on file  Occupational History  . Not on file  Tobacco Use  . Smoking status: Never Smoker  . Smokeless tobacco: Never Used  Substance and Sexual Activity  . Alcohol use: Not Currently  . Drug use: Not on file  . Sexual activity: Not on file  Other Topics Concern  . Not on file  Social History Narrative  . Not on file   Social Determinants of Health   Financial Resource Strain: Low Risk   . Difficulty of Paying Living Expenses: Not hard at all  Food Insecurity: No Food Insecurity  . Worried About Charity fundraiser in the Last Year: Never true  .  Ran Out of Food in the Last Year: Never true  Transportation Needs: No Transportation Needs  . Lack of Transportation (Medical): No  . Lack of Transportation (Non-Medical): No  Physical Activity: Inactive  . Days of Exercise per Week: 0 days  . Minutes of Exercise per Session: 0 min  Stress: No Stress Concern Present  . Feeling of Stress : Not at all  Social Connections:   . Frequency of Communication with Friends and Family:   . Frequency of Social Gatherings with Friends and Family:   . Attends Religious Services:   .  Active Member of Clubs or Organizations:   . Attends Archivist Meetings:   [name] Marital Status:   Intimate Partner Violence: Not At Risk  . Fear of Current or Ex-Partner: No  . Emotionally Abused: No  . Physically Abused: No  . Sexually Abused: No    No past surgical history on file.  No family history on file.  No Known Allergies  Current Outpatient Medications on File Prior to Visit  Medication Sig Dispense Refill  . amLODipine (NORVASC) 5 MG tablet Take 1 tablet (5 mg total) by mouth daily. 60 tablet 5  . carvedilol (COREG) 12.5 MG tablet Take 1 tablet (12.5 mg total) by mouth 2 (two) times daily with a meal. 180 tablet 1  . ferrous sulfate 325 (65 FE) MG tablet Take 325 mg by mouth daily with breakfast.    . hydrALAZINE (APRESOLINE) 100 MG tablet Take 1 tablet (100 mg total) by mouth 2 (two) times daily. 180 tablet 1  . isosorbide mononitrate (IMDUR) 60 MG 24 hr tablet Take 1 tablet (60 mg total) by mouth daily. For blood pressure. 90 tablet 0  . Multiple Vitamin (MULTIVITAMIN WITH MINERALS) TABS tablet Take 1 tablet by mouth daily.     No current facility-administered medications on file prior to visit.    BP (!) 180/100   Pulse 65   Temp (!) 97.2 F (36.2 C) (Temporal)   Ht 5\' 11"  (1.803 m)   Wt 139 lb 8 oz (63.3 kg)   SpO2 95%   BMI 19.46 kg/m    Objective:   Physical Exam  Constitutional: He appears well-nourished.  Cardiovascular: Normal rate and regular rhythm.  Respiratory: Effort normal and breath sounds normal.  Musculoskeletal:     Cervical back: Neck supple.  Skin: Skin is warm and dry.  Psychiatric: He has a normal mood and affect.           Assessment & Plan:

## 2020-02-18 NOTE — Assessment & Plan Note (Signed)
Unclear cause, colonoscopy negative per sister. Could be secondary to CKD? Continue oral iron. Repeat CBC and iron studies today. Consider hematology evaluation after nephrology evaluation.

## 2020-02-18 NOTE — Patient Instructions (Signed)
Stop by the lab prior to leaving today. I will notify you of your results once received.   Continue oral iron everyday.   Follow up with the kidney doctor, please update me after this visit.  You will be contacted regarding your referral to home health physical therapy.  Please let us know if you have not been contacted within two weeks.   It was a pleasure to see you today!

## 2020-02-18 NOTE — Assessment & Plan Note (Signed)
Appointment scheduled for June 2nd with nephrology.

## 2020-02-18 NOTE — Assessment & Plan Note (Addendum)
Improved on recent echocardiogram. Following with cardiology, refuses furosemide.  Continue carvedilol and Imdur.

## 2020-02-19 ENCOUNTER — Encounter: Payer: Medicare Other | Admitting: Internal Medicine

## 2020-02-19 DIAGNOSIS — I872 Venous insufficiency (chronic) (peripheral): Secondary | ICD-10-CM | POA: Diagnosis not present

## 2020-02-19 NOTE — Telephone Encounter (Signed)
Gave the approval for the verbal orders 

## 2020-02-19 NOTE — Telephone Encounter (Signed)
Approved.  

## 2020-02-19 NOTE — Progress Notes (Signed)
MISCHA, POLLARD ([id number]) Visit Report for 02/19/2020 Arrival Information Details Patient Name: Michael Chang, Michael Chang Date of Service: 02/19/2020 9:15 AM Medical Record Number: [id number] Patient Account Number: [id number] Date of Birth/Sex: 08-29-1946 (74 y.o. M) Treating RN: Army Melia Primary Care Brighton Pilley: Alma Friendly Other Clinician: Referring Sayuri Rhames: Alma Friendly Treating Shailah Gibbins/Extender: Tito Dine in Treatment: 6 Visit Information History Since Last Visit Added or deleted any medications: No Patient Arrived: Wheel Chair Any new allergies or adverse reactions: No Arrival Time: 09:37 Had a fall or experienced change in No Accompanied By: daughter activities of daily living that may affect Transfer Assistance: None risk of falls: Patient Identification Verified: Yes Signs or symptoms of abuse/neglect since last visito No Hospitalized since last visit: No Has Dressing in Place as Prescribed: Yes Pain Present Now: No Electronic Signature(s) Signed: 02/19/2020 11:08:48 AM By: Army Melia Entered By: Army Melia on 02/19/2020 09:38:01 [name] ([id number]) -------------------------------------------------------------------------------- Encounter Discharge Information Details Patient Name: [name] Date of Service: 02/19/2020 9:15 AM Medical Record Number: [id number] Patient Account Number: [id number] Date of Birth/Sex: 01-16-1946 (73 y.o. M) Treating RN: Montey Hora Primary Care Laurens Matheny: Alma Friendly Other Clinician: Referring Barabara Motz: Alma Friendly Treating Emelia Sandoval/Extender: Tito Dine in Treatment: 6 Encounter Discharge Information Items Post Procedure Vitals Discharge Condition: Stable Temperature (F): 98.2 Ambulatory Status: Cane Pulse (bpm): 63 Discharge Destination: Home Respiratory Rate (breaths/min): 16 Transportation: Private Auto Blood Pressure (mmHg): 188/86 Accompanied By: sister Schedule  Follow-up Appointment: Yes Clinical Summary of Care: Electronic Signature(s) Signed: 02/19/2020 1:36:47 PM By: Montey Hora Entered By: Montey Hora on 02/19/2020 10:17:49 [name] ([id number]) -------------------------------------------------------------------------------- Lower Extremity Assessment Details Patient Name: [name] Date of Service: 02/19/2020 9:15 AM Medical Record Number: [id number] Patient Account Number: [id number] Date of Birth/Sex: September 11, 1946 (73 y.o. M) Treating RN: Army Melia Primary Care Kamariyah Timberlake: Alma Friendly Other Clinician: Referring Siearra Amberg: Alma Friendly Treating Landen Knoedler/Extender: Ricard Dillon Weeks in Treatment: 6 Edema Assessment Assessed: [Left: No] [Right: No] Edema: [Left: N] [Right: o] Vascular Assessment Pulses: Dorsalis Pedis Palpable: [Right:Yes] Electronic Signature(s) Signed: 02/19/2020 11:08:48 AM By: Army Melia Entered By: Army Melia on 02/19/2020 09:40:16 [name] ([id number]) -------------------------------------------------------------------------------- Multi Wound Chart Details Patient Name: [name] Date of Service: 02/19/2020 9:15 AM Medical Record Number: [id number] Patient Account Number: [id number] Date of Birth/Sex: 06-Feb-1946 (73 y.o. M) Treating RN: Montey Hora Primary Care Ainslee Sou: Alma Friendly Other Clinician: Referring Takya Vandivier: Alma Friendly Treating Trameka Dorough/Extender: Tito Dine in Treatment: 6 Vital Signs Height(in): 71 Pulse(bpm): 17 Weight(lbs): 138 Blood Pressure(mmHg): 188/86 Body Mass Index(BMI): 19 Temperature(F): 98.2 Respiratory Rate(breaths/min): 16 Photos: [N/A:N/A] Wound Location: Right, Medial Ankle N/A N/A Wounding Event: Gradually Appeared N/A N/A Primary Etiology: Venous Leg Ulcer N/A N/A Comorbid History: Hypertension, Myocardial Infarction, N/A N/A Peripheral Venous Disease, End Stage Renal Disease Date Acquired: 01/07/2019  N/A N/A Weeks of Treatment: 6 N/A N/A Wound Status: Open N/A N/A Measurements L x W x D (cm) 3.5x3x0.2 N/A N/A Area (cm) : 8.247 N/A N/A Volume (cm) : 1.649 N/A N/A % Reduction in Area: 74.80% N/A N/A % Reduction in Volume: 87.40% N/A N/A Classification: Full Thickness Without Exposed N/A N/A Support Structures Exudate Amount: Medium N/A N/A Exudate Type: Serous N/A N/A Exudate Color: amber N/A N/A Wound Margin: Thickened N/A N/A Granulation Amount: Large (67-100%) N/A N/A Granulation Quality: Pink N/A N/A Necrotic Amount: Small (1-33%) N/A N/A Exposed Structures: Fat Layer (Subcutaneous Tissue) N/A N/A Exposed: Yes Fascia: No Tendon: No Muscle: No Joint: No Bone: No Epithelialization: None N/A N/A  Debridement: Debridement - Excisional N/A N/A Pre-procedure Verification/Time 10:13 N/A N/A Out Taken: Pain Control: Lidocaine 4% Topical Solution N/A N/A Tissue Debrided: Subcutaneous, Slough N/A N/A Level: Skin/Subcutaneous Tissue N/A N/A Debridement Area (sq cm): 10.5 N/A N/A Instrument: Curette N/A N/A Bleeding: Minimum N/A N/A Hemostasis Achieved: Pressure N/A N/A Procedural Pain: 0 N/A N/A Quiros, Estel ([id number]) Post Procedural Pain: 0 N/A N/A Debridement Treatment Procedure was tolerated well N/A N/A Response: Post Debridement 3.5x3x0.3 N/A N/A Measurements L x W x D (cm) Post Debridement Volume: 2.474 N/A N/A (cm) Procedures Performed: Debridement N/A N/A Treatment Notes Electronic Signature(s) Signed: 02/19/2020 12:54:43 PM By: Linton Ham MD Entered By: Linton Ham on 02/19/2020 10:17:06 [name] ([id number]) -------------------------------------------------------------------------------- Multi-Disciplinary Care Plan Details Patient Name: [name] Date of Service: 02/19/2020 9:15 AM Medical Record Number: [id number] Patient Account Number: [id number] Date of Birth/Sex: 1945/12/22 (73 y.o. M) Treating RN: Montey Hora Primary Care  Daejon Lich: Alma Friendly Other Clinician: Referring Braelon Sprung: Alma Friendly Treating Kaye Mitro/Extender: Tito Dine in Treatment: 6 Active Inactive Orientation to the Wound Care Program Nursing Diagnoses: Knowledge deficit related to the wound healing center program Goals: Patient/caregiver will verbalize understanding of the [place] Program Date Initiated: 01/08/2020 Target Resolution Date: 02/05/2020 Goal Status: Active Interventions: Provide education on orientation to the wound center Notes: Venous Leg Ulcer Nursing Diagnoses: Potential for venous Insuffiency (use before diagnosis confirmed) Goals: Patient/caregiver will verbalize understanding of disease process and disease management Date Initiated: 01/08/2020 Target Resolution Date: 02/05/2020 Goal Status: Active Interventions: Assess peripheral edema status every visit. Notes: Wound/Skin Impairment Nursing Diagnoses: Impaired tissue integrity Goals: Ulcer/skin breakdown will have a volume reduction of 30% by week 4 Date Initiated: 01/08/2020 Target Resolution Date: 02/05/2020 Goal Status: Active Interventions: Assess ulceration(s) every visit Notes: Electronic Signature(s) Signed: 02/19/2020 1:36:47 PM By: Montey Hora Entered By: Montey Hora on 02/19/2020 10:11:29 [name] ([id number]) -------------------------------------------------------------------------------- Pain Assessment Details Patient Name: [name] Date of Service: 02/19/2020 9:15 AM Medical Record Number: [id number] Patient Account Number: [id number] Date of Birth/Sex: 06/21/1946 (74 y.o. M) Treating RN: Army Melia Primary Care Jud Fanguy: Alma Friendly Other Clinician: Referring Zyron Deeley: Alma Friendly Treating Anvika Gashi/Extender: Tito Dine in Treatment: 6 Active Problems Location of Pain Severity and Description of Pain Patient Has Paino No Site Locations Pain Management and  Medication Current Pain Management: Electronic Signature(s) Signed: 02/19/2020 11:08:48 AM By: Army Melia Entered By: Army Melia on 02/19/2020 09:38:20 [name] ([id number]) -------------------------------------------------------------------------------- Patient/Caregiver Education Details Patient Name: [name] Date of Service: 02/19/2020 9:15 AM Medical Record Number: [id number] Patient Account Number: [id number] Date of Birth/Gender: 07/04/1946 (73 y.o. M) Treating RN: Montey Hora Primary Care Physician: Alma Friendly Other Clinician: Referring Physician: Alma Friendly Treating Physician/Extender: Tito Dine in Treatment: 6 Education Assessment Education Provided To: Patient and Caregiver Education Topics Provided Wound/Skin Impairment: Handouts: Other: wound care as ordered Methods: Demonstration, Explain/Verbal Responses: State content correctly Electronic Signature(s) Signed: 02/19/2020 1:36:47 PM By: Montey Hora Entered By: Montey Hora on 02/19/2020 10:17:03 [name] ([id number]) -------------------------------------------------------------------------------- Wound Assessment Details Patient Name: [name] Date of Service: 02/19/2020 9:15 AM Medical Record Number: [id number] Patient Account Number: [id number] Date of Birth/Sex: 08-20-1946 (73 y.o. M) Treating RN: Army Melia Primary Care Dock Baccam: Alma Friendly Other Clinician: Referring Isabellah Sobocinski: Alma Friendly Treating Keziah Avis/Extender: Tito Dine in Treatment: 6 Wound Status Wound Number: 2 Primary Venous Leg Ulcer Etiology: Wound Location: Right, Medial Ankle Wound Open Wounding Event: Gradually Appeared Status: Date Acquired: 01/07/2019 Comorbid Hypertension, Myocardial Infarction, Peripheral  Venous Weeks Of Treatment: 6 History: Disease, End Stage Renal Disease Clustered Wound: No Photos Wound Measurements Length: (cm) 3.5 Width:  (cm) 3 Depth: (cm) 0.2 Area: (cm) 8.247 Volume: (cm) 1.649 % Reduction in Area: 74.8% % Reduction in Volume: 87.4% Epithelialization: None Wound Description Classification: Full Thickness Without Exposed Support Structu Wound Margin: Thickened Exudate Amount: Medium Exudate Type: Serous Exudate Color: amber res Foul Odor After Cleansing: No Slough/Fibrino Yes Wound Bed Granulation Amount: Large (67-100%) Exposed Structure Granulation Quality: Pink Fascia Exposed: No Necrotic Amount: Small (1-33%) Fat Layer (Subcutaneous Tissue) Exposed: Yes Necrotic Quality: Adherent Slough Tendon Exposed: No Muscle Exposed: No Joint Exposed: No Bone Exposed: No Treatment Notes Wound #2 (Right, Medial Ankle) Notes hydrofera blue, ABD, conform, tubi Electronic Signature(s) Signed: 02/19/2020 11:08:48 AM By: Vinnie Langton ([id number]) Entered By: Army Melia on 02/19/2020 09:39:10 [name] ([id number]) -------------------------------------------------------------------------------- Vitals Details Patient Name: [name] Date of Service: 02/19/2020 9:15 AM Medical Record Number: [id number] Patient Account Number: [id number] Date of Birth/Sex: Aug 04, 1946 (73 y.o. M) Treating RN: Army Melia Primary Care Fatina Sprankle: Alma Friendly Other Clinician: Referring Rudransh Bellanca: Alma Friendly Treating Saidy Ormand/Extender: Tito Dine in Treatment: 6 Vital Signs Time Taken: 09:38 Temperature (F): 98.2 Height (in): 71 Pulse (bpm): 63 Weight (lbs): 138 Respiratory Rate (breaths/min): 16 Body Mass Index (BMI): 19.2 Blood Pressure (mmHg): 188/86 Reference Range: 80 - 120 mg / dl Electronic Signature(s) Signed: 02/19/2020 11:08:48 AM By: Army Melia Entered By: Army Melia on 02/19/2020 09:38:15

## 2020-02-19 NOTE — Progress Notes (Signed)
[name], [name] ([id number]) Visit Report for 02/19/2020 Debridement Details Patient Name: Michael Chang, Michael Chang Date of Service: 02/19/2020 9:15 AM Medical Record Number: [id number] Patient Account Number: [id number] Date of Birth/Sex: [birth year] (74 y.o. M) Treating RN: Montey Hora Primary Care Provider: Alma Friendly Other Clinician: Referring Provider: Alma Friendly Treating Provider/Extender: Tito Dine in Treatment: 6 Debridement Performed for Wound #2 Right,Medial Ankle Assessment: Performed By: Physician Ricard Dillon, MD Debridement Type: Debridement Severity of Tissue Pre Debridement: Fat layer exposed Level of Consciousness (Pre- Awake and Alert procedure): Pre-procedure Verification/Time Out Yes - 10:13 Taken: Start Time: 10:13 Pain Control: Lidocaine 4% Topical Solution Total Area Debrided (L x W): 3.5 (cm) x 3 (cm) = 10.5 (cm) Tissue and other material Viable, Non-Viable, Slough, Subcutaneous, Slough debrided: Level: Skin/Subcutaneous Tissue Debridement Description: Excisional Instrument: Curette Bleeding: Minimum Hemostasis Achieved: Pressure End Time: 10:16 Procedural Pain: 0 Post Procedural Pain: 0 Response to Treatment: Procedure was tolerated well Level of Consciousness (Post- Awake and Alert procedure): Post Debridement Measurements of Total Wound Length: (cm) 3.5 Width: (cm) 3 Depth: (cm) 0.3 Volume: (cm) 2.474 Character of Wound/Ulcer Post Debridement: Improved Severity of Tissue Post Debridement: Fat layer exposed Post Procedure Diagnosis Same as Pre-procedure Electronic Signature(s) Signed: 02/19/2020 12:54:43 PM By: Linton Ham MD Signed: 02/19/2020 1:36:47 PM By: Montey Hora Entered By: Linton Ham on 02/19/2020 10:17:20 Michael Chang ([id number]) -------------------------------------------------------------------------------- HPI Details Patient Name: Michael Chang Date of Service: 02/19/2020 9:15  AM Medical Record Number: [id number] Patient Account Number: [id number] Date of Birth/Sex: 1946/06/13 (74 y.o. M) Treating RN: Montey Hora Primary Care Provider: Alma Friendly Other Clinician: Referring Provider: Alma Friendly Treating Provider/Extender: Tito Dine in Treatment: 6 History of Present Illness HPI Description: 01/07/2020 patient presents today for initial evaluation in our clinic concerning issues that he has been having with ulcers on his bilateral feet. More specifically at his left foot and his right ankle and on the ankle it is medial and lateral. With that being said these wounds have apparently been present for a significant amount of time in fact the patient states "20 years". When I questioned this and even ask his family member that was with him today she states that the last time she saw them was around 2006 and they were not this bad. Nonetheless I think he has had this for a significant amount of time although I am unsure of the exact locations of wounds and if some have come and gone. He does have chronic venous insufficiency, lymphedema, hypertension, and has had a heart attack in the recent past few months. Subsequently he just moved from up [place] to this area to live with family. His hypertension has been very uncontrolled he does see Dr. [name] and was placed on amlodipine 5 mg. With that being said unfortunately this does not seem according to family to have helped much with his blood pressure currently though to be honest it somewhat hard to know for sure as he had not taken the medicine before he came in today his blood pressure was significantly elevated but apparently it was when he saw his primary on Tuesday as well but then by the time he took his medicine got to the cardiologist this was much lower dropping from around 856 systolic to around 314. Nonetheless my suggestion in this regard is can be for her to keep a track of his blood pressure  readings morning noon and night in order to see how this goes throughout the day for the next  week and then we will address it following. In regard to the wounds we are going to see what we can do as far as helping with this currently. He also goes to vascular for testing later today. 01/14/2020 upon evaluation today patient's wound actually appears to be doing quite well in general at all locations. He has a lot of new epithelial growth which is great news. Fortunately there is no signs of active infection at this time. He did have his arterial study on the right this was a ABI of 1.44 with a TBI of 1.10 on the left an ABI of 1.46 with a TBI of 0.93. The good news is he has no signs of arterial insufficiency. The patient also did have blood pressure readings that his family member took over the past week morning and evening. Consistently he was much lower in the evening than he was in the morning. They feel like that his amlodipine probably needs to be increased again I explained that I would give there cardiologist, Dr. [name], a call but that that is not something that I would manage here at the wound care center as far as his blood pressure is concerned. Be more than happy to relay that information to Dr. Tyrell Antonio office however. 01/29/2020 upon evaluation today patient appears to be doing well in general with regard to his wounds. With that being said on the medial aspect he does have some purulent drainage noted which is somewhat reminiscent of potential Pseudomonas although there could be something else going on here as well. Nonetheless I think we do need to obtain a culture also think that antibiotics are probably can be necessary at this point. 02/05/2020 upon evaluation today patient appears to be doing excellent in regard to his wounds. In fact 2 wounds are completely healed as of today just the remaining right medial ankle ulcer is still open but appears to be doing much better there is no  evidence of infection today he has been on the antibiotic, Levaquin, that I prescribed for him last week which was appropriate for the infection which showed positive for Morganella and Staphylococcus. 5/21. Still with an extensive area on the right medial ankle. We have been using silver collagen under Tubigrip Electronic Signature(s) Signed: 02/19/2020 12:54:43 PM By: Linton Ham MD Entered By: Linton Ham on 02/19/2020 10:18:15 Michael Chang ([id number]) -------------------------------------------------------------------------------- Physical Exam Details Patient Name: Michael Chang Date of Service: 02/19/2020 9:15 AM Medical Record Number: [id number] Patient Account Number: [id number] Date of Birth/Sex: 25-May-1946 (73 y.o. M) Treating RN: Montey Hora Primary Care Provider: Alma Friendly Other Clinician: Referring Provider: Alma Friendly Treating Provider/Extender: Tito Dine in Treatment: 6 Constitutional Patient is hypertensive.. Pulse regular and within target range for patient.[name] Respirations regular, non-labored and within target range.. Temperature is normal and within the target range for the patient.[name] appears in no distress. Notes Wound exam; the one remaining open areas on the right medial ankle. Using a #5 curette debrided of a relatively large amount of adherent debris and then a very gritty fibrinous adherent surface. Hemostasis with a pressure dressing. Electronic Signature(s) Signed: 02/19/2020 12:54:43 PM By: Linton Ham MD Entered By: Linton Ham on 02/19/2020 10:19:23 Michael Chang ([id number]) -------------------------------------------------------------------------------- Physician Orders Details Patient Name: Michael Chang Date of Service: 02/19/2020 9:15 AM Medical Record Number: [id number] Patient Account Number: [id number] Date of Birth/Sex: 1946-04-09 (73 y.o. M) Treating RN: Montey Hora Primary Care Provider: Alma Friendly Other Clinician: Referring Provider: Alma Friendly Treating Provider/Extender: Linton Ham  G Weeks in Treatment: 6 Verbal / Phone Orders: No Diagnosis Coding Wound Cleansing Wound #2 Right,Medial Ankle o Clean wound with Normal Saline. o Cleanse wound with mild soap and water Primary Wound Dressing Wound #2 Right,Medial Ankle o Hydrafera Blue Ready Transfer Secondary Dressing Wound #2 Right,Medial Ankle o ABD and Kerlix/Conform Dressing Change Frequency Wound #2 Right,Medial Ankle o Change dressing every other day. Follow-up Appointments o Return Appointment in 1 week. Edema Control Wound #2 Right,Medial Ankle o Other: - tubi grip E bilateral Electronic Signature(s) Signed: 02/19/2020 12:54:43 PM By: Linton Ham MD Signed: 02/19/2020 1:36:47 PM By: Montey Hora Entered By: Montey Hora on 02/19/2020 10:16:36 Michael Chang ([id number]) -------------------------------------------------------------------------------- Problem List Details Patient Name: Michael Chang Date of Service: 02/19/2020 9:15 AM Medical Record Number: [id number] Patient Account Number: [id number] Date of Birth/Sex: [birth year] (73 y.o. M) Treating RN: Montey Hora Primary Care Provider: Alma Friendly Other Clinician: Referring Provider: Alma Friendly Treating Provider/Extender: Tito Dine in Treatment: 6 Active Problems ICD-10 Encounter Code Description Active Date MDM Diagnosis I87.2 Venous insufficiency (chronic) (peripheral) 01/07/2020 No Yes I89.0 Lymphedema, not elsewhere classified 01/07/2020 No Yes L97.522 Non-pressure chronic ulcer of other part of left foot with fat layer 01/07/2020 No Yes exposed L97.312 Non-pressure chronic ulcer of right ankle with fat layer exposed 01/07/2020 No Yes I10 Essential (primary) hypertension 01/07/2020 No Yes I25.10 Atherosclerotic heart disease of native coronary artery without angina 01/07/2020 No  Yes pectoris Inactive Problems Resolved Problems Electronic Signature(s) Signed: 02/19/2020 12:54:43 PM By: Linton Ham MD Entered By: Linton Ham on 02/19/2020 10:16:55 Michael Chang ([id number]) -------------------------------------------------------------------------------- Progress Note Details Patient Name: Michael Chang Date of Service: 02/19/2020 9:15 AM Medical Record Number: [id number] Patient Account Number: [id number] Date of Birth/Sex: 12/07/1945 (73 y.o. M) Treating RN: Montey Hora Primary Care Provider: Alma Friendly Other Clinician: Referring Provider: Alma Friendly Treating Provider/Extender: Tito Dine in Treatment: 6 Subjective History of Present Illness (HPI) 01/07/2020 patient presents today for initial evaluation in our clinic concerning issues that he has been having with ulcers on his bilateral feet. More specifically at his left foot and his right ankle and on the ankle it is medial and lateral. With that being said these wounds have apparently been present for a significant amount of time in fact the patient states "20 years". When I questioned this and even ask his family member that was with him today she states that the last time she saw them was around 2006 and they were not this bad. Nonetheless I think he has had this for a significant amount of time although I am unsure of the exact locations of wounds and if some have come and gone. He does have chronic venous insufficiency, lymphedema, hypertension, and has had a heart attack in the recent past few months. Subsequently he just moved from [place] to this area to live with family. His hypertension has been very uncontrolled he does see Dr. [name] and was placed on amlodipine 5 mg. With that being said unfortunately this does not seem according to family to have helped much with his blood pressure currently though to be honest it somewhat hard to know for sure as he had not taken  the medicine before he came in today his blood pressure was significantly elevated but apparently it was when he saw his primary on Tuesday as well but then by the time he took his medicine got to the cardiologist this was much lower dropping from around 093 systolic to around 267. Nonetheless  my suggestion in this regard is can be for her to keep a track of his blood pressure readings morning noon and night in order to see how this goes throughout the day for the next week and then we will address it following. In regard to the wounds we are going to see what we can do as far as helping with this currently. He also goes to vascular for testing later today. 01/14/2020 upon evaluation today patient's wound actually appears to be doing quite well in general at all locations. He has a lot of new epithelial growth which is great news. Fortunately there is no signs of active infection at this time. He did have his arterial study on the right this was a ABI of 1.44 with a TBI of 1.10 on the left an ABI of 1.46 with a TBI of 0.93. The good news is he has no signs of arterial insufficiency. The patient also did have blood pressure readings that his family member took over the past week morning and evening. Consistently he was much lower in the evening than he was in the morning. They feel like that his amlodipine probably needs to be increased again I explained that I would give there cardiologist, Dr. [name], a call but that that is not something that I would manage here at the wound care center as far as his blood pressure is concerned. Be more than happy to relay that information to Dr. Tyrell Antonio office however. 01/29/2020 upon evaluation today patient appears to be doing well in general with regard to his wounds. With that being said on the medial aspect he does have some purulent drainage noted which is somewhat reminiscent of potential Pseudomonas although there could be something else going on here as well.  Nonetheless I think we do need to obtain a culture also think that antibiotics are probably can be necessary at this point. 02/05/2020 upon evaluation today patient appears to be doing excellent in regard to his wounds. In fact 2 wounds are completely healed as of today just the remaining right medial ankle ulcer is still open but appears to be doing much better there is no evidence of infection today he has been on the antibiotic, Levaquin, that I prescribed for him last week which was appropriate for the infection which showed positive for Morganella and Staphylococcus. 5/21. Still with an extensive area on the right medial ankle. We have been using silver collagen under Tubigrip Objective Constitutional Patient is hypertensive.. Pulse regular and within target range for patient.[name] Respirations regular, non-labored and within target range.. Temperature is normal and within the target range for the patient.[name] appears in no distress. Vitals Time Taken: 9:38 AM, Height: 71 in, Weight: 138 lbs, BMI: 19.2, Temperature: 98.2 F, Pulse: 63 bpm, Respiratory Rate: 16 breaths/min, Blood Pressure: 188/86 mmHg. General Notes: Wound exam; the one remaining open areas on the right medial ankle. Using a #5 curette debrided of a relatively large amount of adherent debris and then a very gritty fibrinous adherent surface. Hemostasis with a pressure dressing. Integumentary (Hair, Skin) Wound #2 status is Open. Original cause of wound was Gradually Appeared. The wound is located on the Right,Medial Ankle. The wound measures 3.5cm length x 3cm width x 0.2cm depth; 8.247cm^2 area and 1.649cm^3 volume. There is Fat Layer (Subcutaneous Tissue) Exposed exposed. There is a medium amount of serous drainage noted. The wound margin is thickened. There is large (67-100%) pink granulation within the wound bed. There is a small (1-33%) amount  of necrotic tissue within the wound bed including Adherent Slough. 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 There was a Excisional Skin/Subcutaneous Tissue Debridement with a total area of 10.5 sq cm performed by Ricard Dillon, MD. With the following instrument(s): Curette to remove Viable and Non-Viable tissue/material. Material removed includes Subcutaneous Tissue and Slough and after achieving pain control using Lidocaine 4% Topical Solution. No specimens were taken. A time out was conducted at 10:13, prior to the start of the procedure. A Minimum amount of bleeding was controlled with Pressure. The procedure was tolerated well with a pain level of 0 throughout and a pain level of 0 following the procedure. Post Debridement Measurements: 3.5cm length x 3cm width x 0.3cm depth; 2.474cm^3 volume. Character of Wound/Ulcer Post Debridement is improved. Severity of Tissue Post Debridement is: Fat layer exposed. Post procedure Diagnosis Wound #2: Same as Pre-Procedure Plan Wound Cleansing: Wound #2 Right,Medial Ankle: Clean wound with Normal Saline. Cleanse wound with mild soap and water Primary Wound Dressing: Wound #2 Right,Medial Ankle: Hydrafera Blue Ready Transfer Secondary Dressing: Wound #2 Right,Medial Ankle: ABD and Kerlix/Conform Dressing Change Frequency: Wound #2 Right,Medial Ankle: Change dressing every other day. Follow-up Appointments: Return Appointment in 1 week. Edema Control: Wound #2 Right,Medial Ankle: Other: - tubi  grip E bilateral 1. His wound areas are measuring smaller 2. I change the dressing from silver collagen to Hydrofera Blue. Still under Tubigrip compression 3. I answered the sister's questions about need for some form of compression on his bilateral lower legs Electronic Signature(s) Signed: 02/19/2020 12:54:43 PM By: Linton Ham MD Entered By: Linton Ham on 02/19/2020 10:20:04 Michael Chang ([id number]) -------------------------------------------------------------------------------- SuperBill Details Patient Name: Michael Chang Date of Service: 02/19/2020 Medical Record Number: [id number] Patient Account Number: [id number] Date of Birth/Sex: [birth year] (73 y.o. 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 I10 Essential (primary) hypertension I25.10 Atherosclerotic heart disease of native coronary artery without angina pectoris Facility Procedures CPT4 Code: [id number] Description: 62947 - DEB SUBQ TISSUE 20 SQ CM/< Modifier: Quantity: 1 CPT4 Code: Description: ICD-10 Diagnosis Description M54.650 Non-pressure chronic ulcer of right ankle with fat layer exposed I89.0 Lymphedema, not elsewhere classified Modifier: Quantity: Physician Procedures CPT4 Code: 3546568 Description: 11042 - WC PHYS SUBQ TISS 20 SQ CM Modifier: Quantity: 1 CPT4 Code: Description: ICD-10 Diagnosis Description L27.517 Non-pressure chronic ulcer of right ankle with fat layer exposed I89.0 Lymphedema, not elsewhere classified Modifier: Quantity: Electronic  Signature(s) Signed: 02/19/2020 12:54:43 PM By: Linton Ham MD Entered By: Linton Ham on 02/19/2020 10:20:25

## 2020-02-22 ENCOUNTER — Telehealth: Payer: Self-pay | Admitting: Primary Care

## 2020-02-22 NOTE — Telephone Encounter (Signed)
Gave the approval for the verbal orders 

## 2020-02-22 NOTE — Telephone Encounter (Signed)
Michael Chang with [facility] PT calling for VO  Verbal orders for home PT 2x week x 3 1x week x 3

## 2020-02-22 NOTE — Telephone Encounter (Signed)
Approved.  

## 2020-02-26 ENCOUNTER — Encounter: Payer: Medicare Other | Admitting: Physician Assistant

## 2020-02-26 ENCOUNTER — Other Ambulatory Visit: Payer: Self-pay

## 2020-02-26 DIAGNOSIS — I872 Venous insufficiency (chronic) (peripheral): Secondary | ICD-10-CM | POA: Diagnosis not present

## 2020-02-26 NOTE — Progress Notes (Signed)
Michael Chang, Michael Chang ([id number]) Visit Report for 02/26/2020 Arrival Information Details Patient Name: Michael Chang, Michael Chang Date of Service: 02/26/2020 12:15 PM Medical Record Number: [id number] Patient Account Number: [id number] Date of Birth/Sex: [birth year] (74 y.o. M) Treating RN: Primary Care Merelyn Klump: Alma Friendly Other Clinician: Referring Ashleigh Luckow: Alma Friendly Treating Ruffus Kamaka/Extender: Melburn Hake, HOYT Weeks in Treatment: 7 Visit Information History Since Last Visit Added or deleted any medications: No Patient Arrived: Wheel Chair Any new allergies or adverse reactions: No Arrival Time: 12:24 Had a fall or experienced change in No Accompanied By: daughter activities of daily living that may affect Transfer Assistance: None risk of falls: Patient Identification Verified: Yes Signs or symptoms of abuse/neglect since last visito No Secondary Verification Process Completed: Yes Hospitalized since last visit: No Implantable device outside of the clinic excluding No cellular tissue based products placed in the center since last visit: Has Dressing in Place as Prescribed: Yes Pain Present Now: No Electronic Signature(s) Signed: 02/26/2020 1:20:34 PM By: Lorine Bears RCP, RRT, CHT Entered By: Lorine Bears on 02/26/2020 12:25:51 Michael Chang ([id number]) -------------------------------------------------------------------------------- Clinic Level of Care Assessment Details Patient Name: Michael Chang Date of Service: 02/26/2020 12:15 PM Medical Record Number: [id number] Patient Account Number: [id number] Date of Birth/Sex: 17-Nov-1945 (74 y.o. M) Treating RN: Cornell Barman Primary Care Lynnox Girten: Alma Friendly Other Clinician: Referring Noelia Lenart: Alma Friendly Treating Anaysha Andre/Extender: Melburn Hake, HOYT Weeks in Treatment: 7 Clinic Level of Care Assessment Items TOOL 4 Quantity Score []  - Use when only an EandM is performed on FOLLOW-UP visit  0 ASSESSMENTS - Nursing Assessment / Reassessment X - Reassessment of Co-morbidities (includes updates in patient status) 1 10 X- 1 5 Reassessment of Adherence to Treatment Plan ASSESSMENTS - Wound and Skin Assessment / Reassessment X - Simple Wound Assessment / Reassessment - one wound 1 5 []  - 0 Complex Wound Assessment / Reassessment - multiple wounds []  - 0 Dermatologic / Skin Assessment (not related to wound area) ASSESSMENTS - Focused Assessment []  - Circumferential Edema Measurements - multi extremities 0 []  - 0 Nutritional Assessment / Counseling / Intervention []  - 0 Lower Extremity Assessment (monofilament, tuning fork, pulses) []  - 0 Peripheral Arterial Disease Assessment (using hand held doppler) ASSESSMENTS - Ostomy and/or Continence Assessment and Care []  - Incontinence Assessment and Management 0 []  - 0 Ostomy Care Assessment and Management (repouching, etc.) PROCESS - Coordination of Care X - Simple Patient / Family Education for ongoing care 1 15 []  - 0 Complex (extensive) Patient / Family Education for ongoing care []  - 0 Staff obtains Programmer, systems, Records, Test Results / Process Orders []  - 0 Staff telephones HHA, Nursing Homes / Clarify orders / etc []  - 0 Routine Transfer to another Facility (non-emergent condition) []  - 0 Routine Hospital Admission (non-emergent condition) []  - 0 New Admissions / Biomedical engineer / Ordering NPWT, Apligraf, etc. []  - 0 Emergency Hospital Admission (emergent condition) X- 1 10 Simple Discharge Coordination []  - 0 Complex (extensive) Discharge Coordination PROCESS - Special Needs []  - Pediatric / Minor Patient Management 0 []  - 0 Isolation Patient Management []  - 0 Hearing / Language / Visual special needs []  - 0 Assessment of Community assistance (transportation, D/C planning, etc.) []  - 0 Additional assistance / Altered mentation []  - 0 Support Surface(s) Assessment (bed, cushion, seat,  etc.) INTERVENTIONS - Wound Cleansing / Measurement Michael Chang, Michael Chang ([id number]) X- 1 5 Simple Wound Cleansing - one wound []  - 0 Complex Wound Cleansing - multiple wounds X- 1 5 Wound Imaging (photographs -  any number of wounds) []  - 0 Wound Tracing (instead of photographs) X- 1 5 Simple Wound Measurement - one wound []  - 0 Complex Wound Measurement - multiple wounds INTERVENTIONS - Wound Dressings []  - Small Wound Dressing one or multiple wounds 0 X- 1 15 Medium Wound Dressing one or multiple wounds []  - 0 Large Wound Dressing one or multiple wounds []  - 0 Application of Medications - topical []  - 0 Application of Medications - injection INTERVENTIONS - Miscellaneous []  - External ear exam 0 []  - 0 Specimen Collection (cultures, biopsies, blood, body fluids, etc.) []  - 0 Specimen(s) / Culture(s) sent or taken to Lab for analysis []  - 0 Patient Transfer (multiple staff / Civil Service fast streamer / Similar devices) []  - 0 Simple Staple / Suture removal (25 or less) []  - 0 Complex Staple / Suture removal (26 or more) []  - 0 Hypo / Hyperglycemic Management (close monitor of Blood Glucose) []  - 0 Ankle / Brachial Index (ABI) - do not check if billed separately X- 1 5 Vital Signs Has the patient been seen at the hospital within the last three years: Yes Total Score: 80 Level Of Care: New/Established - Level 3 Electronic Signature(s) Signed: 02/26/2020 2:01:12 PM By: Gretta Cool, BSN, RN, CWS, Kim RN, BSN Entered By: Gretta Cool, BSN, RN, CWS, Kim on 02/26/2020 13:21:55 Michael Chang ([id number]) -------------------------------------------------------------------------------- Encounter Discharge Information Details Patient Name: Michael Chang Date of Service: 02/26/2020 12:15 PM Medical Record Number: [id number] Patient Account Number: [id number] Date of Birth/Sex: 06/06/1946 (73 y.o. M) Treating RN: Cornell Barman Primary Care Maryelizabeth Eberle: Alma Friendly Other Clinician: Referring Prosper Paff:  Alma Friendly Treating Trust Leh/Extender: Melburn Hake, HOYT Weeks in Treatment: 7 Encounter Discharge Information Items Discharge Condition: Stable Ambulatory Status: Walker Discharge Destination: Home Transportation: Private Auto Schedule Follow-up Appointment: Yes Clinical Summary of Care: Electronic Signature(s) Signed: 02/26/2020 1:24:47 PM By: Gretta Cool, BSN, RN, CWS, Kim RN, BSN Entered By: Gretta Cool, BSN, RN, CWS, Kim on 02/26/2020 13:24:47 Michael Chang ([id number]) -------------------------------------------------------------------------------- Lower Extremity Assessment Details Patient Name: Michael Chang Date of Service: 02/26/2020 12:15 PM Medical Record Number: [id number] Patient Account Number: [id number] Date of Birth/Sex: Jul 04, 1946 (73 y.o. M) Treating RN: Cornell Barman Primary Care Meesha Sek: Alma Friendly Other Clinician: Referring Yahmir Sokolov: Alma Friendly Treating Mustaf Antonacci/Extender: Melburn Hake, HOYT Weeks in Treatment: 7 Vascular Assessment Pulses: Dorsalis Pedis Palpable: [Right:Yes] Electronic Signature(s) Signed: 02/26/2020 2:01:12 PM By: Gretta Cool, BSN, RN, CWS, Kim RN, BSN Entered By: Gretta Cool, BSN, RN, CWS, Kim on 02/26/2020 12:33:38 Michael Chang ([id number]) -------------------------------------------------------------------------------- Multi Wound Chart Details Patient Name: Michael Chang Date of Service: 02/26/2020 12:15 PM Medical Record Number: [id number] Patient Account Number: [id number] Date of Birth/Sex: Jul 25, 1946 (73 y.o. M) Treating RN: Cornell Barman Primary Care Chisum Habenicht: Alma Friendly Other Clinician: Referring Treesa Mccully: Alma Friendly Treating Billy Turvey/Extender: Melburn Hake, HOYT Weeks in Treatment: 7 Vital Signs Height(in): 71 Pulse(bpm): 64 Weight(lbs): 138 Blood Pressure(mmHg): 160/90 Body Mass Index(BMI): 19 Temperature(F): 98.2 Respiratory Rate(breaths/min): 16 Photos: [N/A:N/A] Wound Location: Right, Medial Ankle N/A N/A Wounding  Event: Gradually Appeared N/A N/A Primary Etiology: Venous Leg Ulcer N/A N/A Comorbid History: Hypertension, Myocardial Infarction, N/A N/A Peripheral Venous Disease, End Stage Renal Disease Date Acquired: 01/07/2019 N/A N/A Weeks of Treatment: 7 N/A N/A Wound Status: Open N/A N/A Measurements L x W x D (cm) 3x2.2x0.2 N/A N/A Area (cm) : 5.184 N/A N/A Volume (cm) : 1.037 N/A N/A % Reduction in Area: 84.10% N/A N/A % Reduction in Volume: 92.10% N/A N/A Classification: Full Thickness Without Exposed N/A N/A Support Structures Exudate Amount: Medium  N/A N/A Exudate Type: Serous N/A N/A Exudate Color: amber N/A N/A Wound Margin: Thickened N/A N/A Granulation Amount: Medium (34-66%) N/A N/A Granulation Quality: Pink N/A N/A Necrotic Amount: Medium (34-66%) N/A N/A Exposed Structures: Fat Layer (Subcutaneous Tissue) N/A N/A Exposed: Yes Fascia: No Tendon: No Muscle: No Joint: No Bone: No Epithelialization: None N/A N/A Treatment Notes Electronic Signature(s) Signed: 02/26/2020 2:01:12 PM By: Gretta Cool, BSN, RN, CWS, Kim RN, BSN Entered By: Gretta Cool, BSN, RN, CWS, Kim on 02/26/2020 12:34:51 Michael Chang ([id number]) -------------------------------------------------------------------------------- Multi-Disciplinary Care Plan Details Patient Name: Michael Chang Date of Service: 02/26/2020 12:15 PM Medical Record Number: [id number] Patient Account Number: [id number] Date of Birth/Sex: September 10, 1946 (73 y.o. M) Treating RN: Cornell Barman Primary Care Senovia Gauer: Alma Friendly Other Clinician: Referring Grizelda Piscopo: Alma Friendly Treating Piedad Standiford/Extender: Melburn Hake, HOYT Weeks in Treatment: 7 Active Inactive Orientation to the Wound Care Program Nursing Diagnoses: Knowledge deficit related to the wound healing center program Goals: Patient/caregiver will verbalize understanding of the [facility] Program Date Initiated: 01/08/2020 Target Resolution Date: 02/05/2020 Goal Status:  Active Interventions: Provide education on orientation to the wound center Notes: Venous Leg Ulcer Nursing Diagnoses: Potential for venous Insuffiency (use before diagnosis confirmed) Goals: Patient/caregiver will verbalize understanding of disease process and disease management Date Initiated: 01/08/2020 Target Resolution Date: 02/05/2020 Goal Status: Active Interventions: Assess peripheral edema status every visit. Notes: Wound/Skin Impairment Nursing Diagnoses: Impaired tissue integrity Goals: Ulcer/skin breakdown will have a volume reduction of 30% by week 4 Date Initiated: 01/08/2020 Target Resolution Date: 02/05/2020 Goal Status: Active Interventions: Assess ulceration(s) every visit Notes: Electronic Signature(s) Signed: 02/26/2020 2:01:12 PM By: Gretta Cool, BSN, RN, CWS, Kim RN, BSN Entered By: Gretta Cool, BSN, RN, CWS, Kim on 02/26/2020 12:34:43 Michael Chang ([id number]) -------------------------------------------------------------------------------- Pain Assessment Details Patient Name: Michael Chang Date of Service: 02/26/2020 12:15 PM Medical Record Number: [id number] Patient Account Number: [id number] Date of Birth/Sex: 17-Nov-1945 (73 y.o. M) Treating RN: Cornell Barman Primary Care Deena Shaub: Alma Friendly Other Clinician: Referring Ramisa Duman: Alma Friendly Treating Seretha Estabrooks/Extender: Melburn Hake, HOYT Weeks in Treatment: 7 Active Problems Location of Pain Severity and Description of Pain Patient Has Paino No Site Locations Pain Management and Medication Current Pain Management: Electronic Signature(s) Signed: 02/26/2020 2:01:12 PM By: Gretta Cool, BSN, RN, CWS, Kim RN, BSN Entered By: Gretta Cool, BSN, RN, CWS, Kim on 02/26/2020 12:31:38 Michael Chang ([id number]) -------------------------------------------------------------------------------- Patient/Caregiver Education Details Patient Name: Michael Chang Date of Service: 02/26/2020 12:15 PM Medical Record Number:  [id number] Patient Account Number: [id number] Date of Birth/Gender: 05-16-1946 (73 y.o. M) Treating RN: Cornell Barman Primary Care Physician: Alma Friendly Other Clinician: Referring Physician: Alma Friendly Treating Physician/Extender: Sharalyn Ink in Treatment: 7 Education Assessment Education Provided To: Patient Education Topics Provided Wound/Skin Impairment: Handouts: Other: wound care as prescribed Electronic Signature(s) Signed: 02/26/2020 2:01:12 PM By: Gretta Cool, BSN, RN, CWS, Kim RN, BSN Entered By: Gretta Cool, BSN, RN, CWS, Kim on 02/26/2020 13:22:13 Michael Chang ([id number]) -------------------------------------------------------------------------------- Wound Assessment Details Patient Name: Michael Chang Date of Service: 02/26/2020 12:15 PM Medical Record Number: [id number] Patient Account Number: [id number] Date of Birth/Sex: [birth year] (73 y.o. M) Treating RN: Cornell Barman Primary Care Kamyra Schroeck: Alma Friendly Other Clinician: Referring Braxtyn Dorff: Alma Friendly Treating Summar Mcglothlin/Extender: Melburn Hake, HOYT Weeks in Treatment: 7 Wound Status Wound Number: 2 Primary Venous Leg Ulcer Etiology: Wound Location: Right, Medial Ankle Wound Open Wounding Event: Gradually Appeared Status: Date Acquired: 01/07/2019 Comorbid Hypertension, Myocardial Infarction, Peripheral Venous Weeks Of Treatment: 7 History: Disease, End Stage Renal Disease Clustered Wound: No Photos Wound Measurements Length: (cm)  3 Width: (cm) 2.2 Depth: (cm) 0.2 Area: (cm) 5.184 Volume: (cm) 1.037 % Reduction in Area: 84.1% % Reduction in Volume: 92.1% Epithelialization: None Tunneling: No Undermining: No Wound Description Classification: Full Thickness Without Exposed Support Structu Wound Margin: Thickened Exudate Amount: Medium Exudate Type: Serous Exudate Color: amber res Foul Odor After Cleansing: No Slough/Fibrino Yes Wound Bed Granulation Amount: Medium (34-66%) Exposed  Structure Granulation Quality: Pink Fascia Exposed: No Necrotic Amount: Medium (34-66%) Fat Layer (Subcutaneous Tissue) Exposed: Yes Necrotic Quality: Adherent Slough Tendon Exposed: No Muscle Exposed: No Joint Exposed: No Bone Exposed: No Treatment Notes Wound #2 (Right, Medial Ankle) Notes hydrofera blue, ABD, conform, tubi Electronic Signature(s) Signed: 02/26/2020 2:01:12 PM By: Gretta Cool, BSN, RN, CWS, Kim RN, BSN Michael Chang, Michael Chang ([id number]) Entered By: Gretta Cool, BSN, RN, CWS, Kim on 02/26/2020 12:33:15 Michael Chang ([id number]) -------------------------------------------------------------------------------- Vitals Details Patient Name: Michael Chang Date of Service: 02/26/2020 12:15 PM Medical Record Number: [id number] Patient Account Number: [id number] Date of Birth/Sex: 1946/04/18 (73 y.o. M) Treating RN: Primary Care Anel Purohit: Alma Friendly Other Clinician: Referring Aravind Chrismer: Alma Friendly Treating Amritha Yorke/Extender: Melburn Hake, HOYT Weeks in Treatment: 7 Vital Signs Time Taken: 12:25 Temperature (F): 98.2 Height (in): 71 Pulse (bpm): 64 Weight (lbs): 138 Respiratory Rate (breaths/min): 16 Body Mass Index (BMI): 19.2 Blood Pressure (mmHg): 160/90 Reference Range: 80 - 120 mg / dl Electronic Signature(s) Signed: 02/26/2020 1:20:34 PM By: Lorine Bears RCP, RRT, CHT Entered By: Lorine Bears on 02/26/2020 12:30:36

## 2020-02-26 NOTE — Progress Notes (Addendum)
Michael Chang, Michael Chang ([id number]) Visit Report for 02/26/2020 Chief Complaint Document Details Patient Name: Michael Chang, Michael Chang Date of Service: 02/26/2020 12:15 PM Medical Record Number: [id number] Patient Account Number: [id number] Date of Birth/Sex: [birth year] (74 y.o. M) Treating RN: Primary Care Provider: Alma Friendly Other Clinician: Referring Provider: Alma Friendly Treating Provider/Extender: Melburn Hake, Jennipher Weatherholtz Weeks in Treatment: 7 Information Obtained from: Patient Chief Complaint Right foot and ankle ulcers Electronic Signature(s) Signed: 02/26/2020 12:38:02 PM By: Worthy Keeler PA-C Entered By: Worthy Keeler on 02/26/2020 12:38:02 Michael Chang ([id number]) -------------------------------------------------------------------------------- HPI Details Patient Name: Michael Chang Date of Service: 02/26/2020 12:15 PM Medical Record Number: [id number] Patient Account Number: [id number] Date of Birth/Sex: 1945-10-10 (73 y.o. M) Treating RN: Primary Care Provider: Alma Friendly Other Clinician: Referring Provider: Alma Friendly Treating Provider/Extender: Melburn Hake, Jeanmarc Viernes Weeks in Treatment: 7 History of Present Illness HPI Description: 01/07/2020 patient presents today for initial evaluation in our clinic concerning issues that he has been having with ulcers on his bilateral feet. More specifically at his left foot and his right ankle and on the ankle it is medial and lateral. With that being said these wounds have apparently been present for a significant amount of time in fact the patient states "20 years". When I questioned this and even ask his family member that was with him today she states that the last time she saw them was around 2006 and they were not this bad. Nonetheless I think he has had this for a significant amount of time although I am unsure of the exact locations of wounds and if some have come and gone. He does have chronic venous insufficiency, lymphedema,  hypertension, and has had a heart attack in the recent past few months. Subsequently he just moved from [place] to this area to live with family. His hypertension has been very uncontrolled he does see Dr. [name] and was placed on amlodipine 5 mg. With that being said unfortunately this does not seem according to family to have helped much with his blood pressure currently though to be honest it somewhat hard to know for sure as he had not taken the medicine before he came in today his blood pressure was significantly elevated but apparently it was when he saw his primary on Tuesday as well but then by the time he took his medicine got to the cardiologist this was much lower dropping from around 062 systolic to around 376. Nonetheless my suggestion in this regard is can be for her to keep a track of his blood pressure readings morning noon and night in order to see how this goes throughout the day for the next week and then we will address it following. In regard to the wounds we are going to see what we can do as far as helping with this currently. He also goes to vascular for testing later today. 01/14/2020 upon evaluation today patient's wound actually appears to be doing quite well in general at all locations. He has a lot of new epithelial growth which is great news. Fortunately there is no signs of active infection at this time. He did have his arterial study on the right this was a ABI of 1.44 with a TBI of 1.10 on the left an ABI of 1.46 with a TBI of 0.93. The good news is he has no signs of arterial insufficiency. The patient also did have blood pressure readings that his family member took over the past week morning and evening. Consistently he was much lower  in the evening than he was in the morning. They feel like that his amlodipine probably needs to be increased again I explained that I would give there cardiologist, Dr. [name], a call but that that is not something that I would manage  here at the wound care center as far as his blood pressure is concerned. Be more than happy to relay that information to Dr. Tyrell Antonio office however. 01/29/2020 upon evaluation today patient appears to be doing well in general with regard to his wounds. With that being said on the medial aspect he does have some purulent drainage noted which is somewhat reminiscent of potential Pseudomonas although there could be something else going on here as well. Nonetheless I think we do need to obtain a culture also think that antibiotics are probably can be necessary at this point. 02/05/2020 upon evaluation today patient appears to be doing excellent in regard to his wounds. In fact 2 wounds are completely healed as of today just the remaining right medial ankle ulcer is still open but appears to be doing much better there is no evidence of infection today he has been on the antibiotic, Levaquin, that I prescribed for him last week which was appropriate for the infection which showed positive for Morganella and Staphylococcus. 5/21. Still with an extensive area on the right medial ankle. We have been using silver collagen under Tubigrip 02/26/2020 upon evaluation today patient appears to be doing well in regard to his wounds at this point. In fact he just has 1 remaining currently and this actually showed signs of good epithelial growth. There does not appear to be any evidence of active infection at this time which is excellent news overall the patient is very pleased with how things seem to be progressing. He is having no pain at this point. Electronic Signature(s) Signed: 02/26/2020 1:47:19 PM By: Worthy Keeler PA-C Entered By: Worthy Keeler on 02/26/2020 13:47:18 Michael Chang, Michael Chang ([id number]) -------------------------------------------------------------------------------- Physical Exam Details Patient Name: Michael Chang Date of Service: 02/26/2020 12:15 PM Medical Record Number: [id number] Patient  Account Number: [id number] Date of Birth/Sex: 1945/11/16 (74 y.o. M) Treating RN: Primary Care Provider: Alma Friendly Other Clinician: Referring Provider: Alma Friendly Treating Provider/Extender: STONE III, Jerrie Gullo Weeks in Treatment: 7 Constitutional Well-nourished and well-hydrated in no acute distress. Respiratory normal breathing without difficulty. Psychiatric this patient is able to make decisions and demonstrates good insight into disease process. Alert and Oriented x 3. pleasant and cooperative. Notes Upon inspection patient's wound bed actually showed signs of good granulation at this time. Fortunately there is no evidence of active infection and he has a lot of new epithelial growth around the edges of the wound as well which is also great news. Electronic Signature(s) Signed: 02/26/2020 1:48:28 PM By: Worthy Keeler PA-C Entered By: Worthy Keeler on 02/26/2020 13:48:28 Michael Chang, Michael Chang ([id number]) -------------------------------------------------------------------------------- Physician Orders Details Patient Name: Michael Chang Date of Service: 02/26/2020 12:15 PM Medical Record Number: [id number] Patient Account Number: [id number] Date of Birth/Sex: 1946/03/07 (73 y.o. 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 o Cleanse wound with mild soap and water Primary Wound Dressing Wound #2 Right,Medial Ankle o Hydrafera Blue Ready Transfer Secondary Dressing Wound #2 Right,Medial Ankle o ABD and Kerlix/Conform Dressing Change Frequency Wound #2 Right,Medial Ankle o Change dressing every other day. Follow-up Appointments o Return Appointment in 1 week. Edema Control Wound #2 Right,Medial Ankle o Other: - tubi grip E bilateral Electronic Signature(s) Signed: 02/26/2020 2:01:12 PM By: Gretta Cool, BSN, RN, CWS, Kim RN, BSN Signed: 02/26/2020 2:31:22 PM By: Worthy Keeler PA-C Entered By: Gretta Cool, BSN, RN, CWS, Kim on 02/26/2020 12:54:41 Michael Chang, Michael Chang ([id number]) -------------------------------------------------------------------------------- Problem List Details Patient Name: Michael Chang Date of Service: 02/26/2020 12:15 PM Medical Record Number: [id number] Patient Account Number: [id number] Date of Birth/Sex: [birth year] (73 y.o. M) Treating RN: Primary Care Provider: Alma Friendly Other Clinician: Referring Provider: Alma Friendly Treating Provider/Extender: Melburn Hake, Alvon Nygaard Weeks in Treatment: 7 Active Problems ICD-10 Encounter Code Description Active Date MDM Diagnosis I87.2 Venous insufficiency (chronic) (peripheral) 01/07/2020 No Yes I89.0 Lymphedema, not elsewhere classified 01/07/2020 No Yes L97.522 Non-pressure chronic ulcer of other part of left foot with fat layer 01/07/2020 No Yes exposed L97.312 Non-pressure chronic ulcer of right ankle with fat layer exposed 01/07/2020 No Yes I10 Essential (primary) hypertension 01/07/2020 No Yes I25.10 Atherosclerotic heart disease of native coronary artery without angina 01/07/2020 No Yes pectoris Inactive Problems Resolved Problems Electronic Signature(s) Signed: 02/26/2020 12:37:53 PM By: Worthy Keeler PA-C Entered By: Worthy Keeler on 02/26/2020 12:37:53 Michael Chang  ([id number]) -------------------------------------------------------------------------------- Progress Note Details Patient Name: Michael Chang Date of Service: 02/26/2020 12:15 PM Medical Record Number: [id number] Patient Account Number: [id number] Date of Birth/Sex: 1946/03/15 (73 y.o. M) Treating RN: Primary Care Provider: Alma Friendly Other Clinician: Referring Provider: Alma Friendly Treating Provider/Extender: Melburn Hake, Laith Antonelli Weeks in Treatment: 7 Subjective Chief Complaint Information obtained from Patient Right foot and ankle ulcers History of Present Illness (HPI) 01/07/2020 patient presents today for initial evaluation in our clinic concerning issues that he has been having with ulcers on his bilateral feet. More specifically at his left foot and his right ankle and on the ankle it is medial and lateral. With that being said these wounds have apparently been present for a significant amount of time in fact the patient states "20 years". When I questioned this and even ask his family member that was with him today she states that the last time she saw them was around 2006 and they were not this bad. Nonetheless I think he has had this for a significant amount of time although I am unsure of the exact locations of wounds and if some have come and gone. He does have chronic venous insufficiency, lymphedema, hypertension, and has had a heart attack in the recent past few months. Subsequently he just moved from [place] to this area to live with family. His hypertension has been very uncontrolled he does see Dr. [name] and was placed on amlodipine 5 mg. With that being said unfortunately this does not seem according to family to have helped much with his blood pressure currently though to be honest it somewhat hard to know for sure as he had not taken the medicine before he came in today his blood pressure was significantly elevated but apparently it was when he saw his primary on  Tuesday as well but then by the time he took his medicine got to the cardiologist this was much  lower dropping from around 619 systolic to around 509. Nonetheless my suggestion in this regard is can be for her to keep a track of his blood pressure readings morning noon and night in order to see how this goes throughout the day for the next week and then we will address it following. In regard to the wounds we are going to see what we can do as far as helping with this currently. He also goes to vascular for testing later today. 01/14/2020 upon evaluation today patient's wound actually appears to be doing quite well in general at all locations. He has a lot of new epithelial growth which is great news. Fortunately there is no signs of active infection at this time. He did have his arterial study on the right this was a ABI of 1.44 with a TBI of 1.10 on the left an ABI of 1.46 with a TBI of 0.93. The good news is he has no signs of arterial insufficiency. The patient also did have blood pressure readings that his family member took over the past week morning and evening. Consistently he was much lower in the evening than he was in the morning. They feel like that his amlodipine probably needs to be increased again I explained that I would give there cardiologist, Dr. [name], a call but that that is not something that I would manage here at the wound care center as far as his blood pressure is concerned. Be more than happy to relay that information to Dr. Tyrell Antonio office however. 01/29/2020 upon evaluation today patient appears to be doing well in general with regard to his wounds. With that being said on the medial aspect he does have some purulent drainage noted which is somewhat reminiscent of potential Pseudomonas although there could be something else going on here as well. Nonetheless I think we do need to obtain a culture also think that antibiotics are probably can be necessary at this  point. 02/05/2020 upon evaluation today patient appears to be doing excellent in regard to his wounds. In fact 2 wounds are completely healed as of today just the remaining right medial ankle ulcer is still open but appears to be doing much better there is no evidence of infection today he has been on the antibiotic, Levaquin, that I prescribed for him last week which was appropriate for the infection which showed positive for Morganella and Staphylococcus. 5/21. Still with an extensive area on the right medial ankle. We have been using silver collagen under Tubigrip 02/26/2020 upon evaluation today patient appears to be doing well in regard to his wounds at this point. In fact he just has 1 remaining currently and this actually showed signs of good epithelial growth. There does not appear to be any evidence of active infection at this time which is excellent news overall the patient is very pleased with how things seem to be progressing. He is having no pain at this point. Objective Constitutional Well-nourished and well-hydrated in no acute distress. Vitals Time Taken: 12:25 PM, Height: 71 in, Weight: 138 lbs, BMI: 19.2, Temperature: 98.2 F, Pulse: 64 bpm, Respiratory Rate: 16 breaths/min, Blood Pressure: 160/90 mmHg. Respiratory normal breathing without difficulty. Psychiatric Michael Chang, Michael Chang ([id number]) this patient is able to make decisions and demonstrates good insight into disease process. Alert and Oriented x 3. pleasant and cooperative. General Notes: Upon inspection patient's wound bed actually showed signs of good granulation at this time. Fortunately there is no evidence of active infection and he has a  lot of new epithelial growth around the edges of the wound as well which is also great news. Integumentary (Hair, Skin) Wound #2 status is Open. Original cause of wound was Gradually Appeared. The wound is located on the Right,Medial Ankle. The wound measures 3cm length x 2.2cm width  x 0.2cm depth; 5.184cm^2 area and 1.037cm^3 volume. There is Fat Layer (Subcutaneous Tissue) Exposed exposed. There is no tunneling or undermining noted. There is a medium amount of serous drainage noted. The wound margin is thickened. There is medium (34-66%) pink granulation within the wound bed. There is a medium (34-66%) amount of necrotic tissue within the wound bed including Adherent Slough. 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 Cleanse wound with mild soap and water Primary Wound Dressing: Wound #2 Right,Medial Ankle: Hydrafera Blue Ready Transfer Secondary Dressing: Wound #2 Right,Medial Ankle: ABD and Kerlix/Conform Dressing Change Frequency: Wound #2 Right,Medial Ankle: Change dressing every other day. Follow-up Appointments: Return Appointment in 1 week. Edema Control: Wound #2 Right,Medial Ankle: Other: - tubi grip E bilateral 1., Recommend currently that we continue at this point with the [facility] dressing which seems to actually be doing quite well for him I am very pleased in that regard. The patient and his family member are in agreement with the plan. 2. I am also can recommend that we continue with the Tubigrip that seems to be doing well and they like the ability to be able to change the dressing on a fairly regular basis which I think is also good. We will see patient back for reevaluation in 1 week here in the clinic. If anything worsens or changes patient will contact our office for additional recommendations. Electronic Signature(s) Signed:  02/26/2020 1:50:07 PM By: Worthy Keeler PA-C Entered By: Worthy Keeler on 02/26/2020 13:50:07 Michael Chang ([id number]) -------------------------------------------------------------------------------- SuperBill Details Patient Name: Michael Chang Date of Service: 02/26/2020 Medical Record Number: [id number] Patient Account Number: [id number] Date of Birth/Sex: Sep 24, 1946 (73 y.o. M) Treating RN: Primary Care Provider: Alma Friendly Other Clinician: Referring Provider: Alma Friendly Treating Provider/Extender: Melburn Hake, Amontae Ng Weeks in Treatment: 7 Diagnosis Coding ICD-10 Codes Code Description I87.2 Venous insufficiency (chronic) (peripheral) I89.0 Lymphedema, not elsewhere classified L97.522 Non-pressure chronic ulcer of other part of left foot with fat layer exposed L97.312 Non-pressure chronic ulcer of right ankle with fat layer exposed [address] (primary) hypertension I25.10 Atherosclerotic heart disease of native coronary artery without angina pectoris Facility Procedures CPT4 Code: 11941740 Description: 99213 - WOUND CARE VISIT-LEV 3 EST PT Modifier: Quantity: 1 Physician Procedures CPT4 Code: [id number] Description: 99213 - WC PHYS LEVEL 3 - EST PT Modifier: Quantity: 1 CPT4 Code: Description: ICD-10 Diagnosis Description I87.2 Venous insufficiency (chronic) (peripheral) I89.0 Lymphedema, not elsewhere classified L97.522 Non-pressure chronic ulcer of other part of left foot with fat layer ex L97.312 Non-pressure chronic ulcer of  right ankle with fat layer exposed Modifier: posed Quantity: Electronic Signature(s) Signed: 02/26/2020 1:54:20 PM By: Worthy Keeler PA-C Entered By: Worthy Keeler on 02/26/2020 13:54:20

## 2020-03-01 DIAGNOSIS — I5022 Chronic systolic (congestive) heart failure: Secondary | ICD-10-CM | POA: Diagnosis not present

## 2020-03-01 DIAGNOSIS — I739 Peripheral vascular disease, unspecified: Secondary | ICD-10-CM | POA: Diagnosis not present

## 2020-03-01 DIAGNOSIS — L97319 Non-pressure chronic ulcer of right ankle with unspecified severity: Secondary | ICD-10-CM | POA: Diagnosis not present

## 2020-03-01 DIAGNOSIS — R55 Syncope and collapse: Secondary | ICD-10-CM | POA: Diagnosis not present

## 2020-03-01 DIAGNOSIS — I2510 Atherosclerotic heart disease of native coronary artery without angina pectoris: Secondary | ICD-10-CM | POA: Diagnosis not present

## 2020-03-01 DIAGNOSIS — H269 Unspecified cataract: Secondary | ICD-10-CM | POA: Diagnosis not present

## 2020-03-01 DIAGNOSIS — M545 Low back pain: Secondary | ICD-10-CM | POA: Diagnosis not present

## 2020-03-01 DIAGNOSIS — N183 Chronic kidney disease, stage 3 unspecified: Secondary | ICD-10-CM | POA: Diagnosis not present

## 2020-03-01 DIAGNOSIS — I252 Old myocardial infarction: Secondary | ICD-10-CM | POA: Diagnosis not present

## 2020-03-01 DIAGNOSIS — D509 Iron deficiency anemia, unspecified: Secondary | ICD-10-CM | POA: Diagnosis not present

## 2020-03-01 DIAGNOSIS — I87311 Chronic venous hypertension (idiopathic) with ulcer of right lower extremity: Secondary | ICD-10-CM | POA: Diagnosis not present

## 2020-03-01 DIAGNOSIS — S0181XD Laceration without foreign body of other part of head, subsequent encounter: Secondary | ICD-10-CM | POA: Diagnosis not present

## 2020-03-01 DIAGNOSIS — I130 Hypertensive heart and chronic kidney disease with heart failure and stage 1 through stage 4 chronic kidney disease, or unspecified chronic kidney disease: Secondary | ICD-10-CM | POA: Diagnosis not present

## 2020-03-01 DIAGNOSIS — W19XXXD Unspecified fall, subsequent encounter: Secondary | ICD-10-CM | POA: Diagnosis not present

## 2020-03-01 NOTE — Telephone Encounter (Signed)
   Primary Cardiologist: [name], MD  Chart reviewed as part of pre-operative protocol coverage. Cataract extractions are recognized in guidelines as low risk surgeries that do not typically require specific preoperative testing or holding of blood thinner therapy. Therefore, given past medical history and time since last visit, based on ACC/AHA guidelines, Rehman Levinson would be at acceptable risk for the planned procedure without further cardiovascular testing.   I will route this recommendation to the requesting party via Epic fax function and remove from pre-op pool.  Please call with questions.  [place], [place] 03/01/2020, 11:14 AM

## 2020-03-02 ENCOUNTER — Other Ambulatory Visit: Payer: Self-pay | Admitting: Nephrology

## 2020-03-02 ENCOUNTER — Other Ambulatory Visit (HOSPITAL_COMMUNITY): Payer: Self-pay | Admitting: Nephrology

## 2020-03-02 DIAGNOSIS — D649 Anemia, unspecified: Secondary | ICD-10-CM | POA: Insufficient documentation

## 2020-03-02 DIAGNOSIS — N184 Chronic kidney disease, stage 4 (severe): Secondary | ICD-10-CM | POA: Diagnosis not present

## 2020-03-02 DIAGNOSIS — N189 Chronic kidney disease, unspecified: Secondary | ICD-10-CM

## 2020-03-02 DIAGNOSIS — R809 Proteinuria, unspecified: Secondary | ICD-10-CM | POA: Insufficient documentation

## 2020-03-02 DIAGNOSIS — D631 Anemia in chronic kidney disease: Secondary | ICD-10-CM

## 2020-03-02 DIAGNOSIS — I129 Hypertensive chronic kidney disease with stage 1 through stage 4 chronic kidney disease, or unspecified chronic kidney disease: Secondary | ICD-10-CM | POA: Diagnosis not present

## 2020-03-02 HISTORY — DX: Chronic kidney disease, unspecified: D63.1

## 2020-03-02 NOTE — Progress Notes (Signed)
Cardiology Office Note    Date:  03/09/2020   ID:  Michael Chang, DOB October 07, 1945, MRN [id number]  PCP:  [name], NP  Cardiologist:  [name], MD  Electrophysiologist:  None   Chief Complaint: Hospital follow up  History of Present Illness:   Michael Chang is a 74 y.o. male with history of CAD, chronic combined systolic and diastolic CHF with subsequent normalization of LVSF, PAD with chronic right lower extremity ulceration with normal ABI in 11/2019 followed by vascular surgery, CKD stage IV, HTN, HLD, anemia, prior alcohol use quitting in 2009, and falls/gait instability who presents for hospital follow up as detailed below.   He moved from [place] to [place] to be closer to family. Previously, he did not seek medical attention for many years, possibly dating back to 2007, until he was diagnosed with heart failure in 11/2019 in [place] with an EF of 40-45%.  His initial presentation was felt to possibly be a syncopal episode. He did have a NSTEMI, and was managed medically. During his admission, he was anemic with EGD showing moderately erythematous stomach mucosa and colonoscopy with internal hemorrhoids. The chronicity of his renal dysfunction was uncertain. He established with Dr. [name] in 12/2019 with noted stable exertional dyspnea. He was admitted to [facility] from 5/16 to 5/17 for presumed fall. Echo on 02/14/2020 showed an EF of 55-60%, no RWMA, severe LVH, Gr1DD, RVSF and RV cavity size normal, mildly dilated left atrium, mild tricuspid regurgitation. Covid negative. HS-Tn 44 with a delta of 36. He was not orthostatic. He declined IV iron for his anemia. Norvasc was decreased at discharge, he was otherwise continued on Coreg, hydralazine and Imdur.   He comes in accompanied by his sister today and is doing well from a cardiac perspective.  Since his hospital discharge she has been seen by nephrology with titration of amlodipine to 10 mg daily.  With this, a.m. BPs, prior to taking  antihypertensives, typically run in the 160s over 80s with evening BPs in the 140-150/70-80 range.  Both patient and sister feel like a good BP for him will be in the 211H systolic as much lower than this he becomes quite dizzy and lethargic.  No chest pain, shortness of breath, palpitations, dizziness, presyncope, or syncope.  He denies any lower extremity swelling, abdominal distention, orthopnea, PND, or early satiety.  He is not on a standing diuretic.  He does not add salt to food and drinks approximately 2 L of fluids per day.   Labs independently reviewed: 01/2020 - HGB 9.2, PLT 278, potassium 4.2, BUN 43, SCr 2.06, albumin 2.9, AST/ALT normal 12/2019 - TSH normal, TC 133, TG 62, HDL 43, LDL 77  Past Medical History:  Diagnosis Date  . Anemia   . CAD (coronary artery disease)   . Chronic combined systolic (congestive) and diastolic (congestive) heart failure ([place])   . Chronic ulcer of right lower extremity ([place])   . CKD (chronic kidney disease), stage III   . Essential hypertension   . HLD (hyperlipidemia)   . NSTEMI (non-ST elevated myocardial infarction) ([place])   . PVD (peripheral vascular disease) ([place])     History reviewed. No pertinent surgical history.  Current Medications: Current Meds  Medication Sig  . amLODipine (NORVASC) 10 MG tablet Take 10 mg by mouth daily.  [name] aspirin 81 MG EC tablet Take by mouth.  . carvedilol (COREG) 12.5 MG tablet Take 1 tablet (12.5 mg total) by mouth 2 (two) times daily with a  meal.  . hydrALAZINE (APRESOLINE) 100 MG tablet Take 1 tablet (100 mg total) by mouth 2 (two) times daily.  . isosorbide mononitrate (IMDUR) 60 MG 24 hr tablet Take 1 tablet (60 mg total) by mouth daily. For blood pressure.  . Multiple Vitamin (MULTIVITAMIN WITH MINERALS) TABS tablet Take 1 tablet by mouth daily.  . Multiple Vitamins-Minerals (CENTRUM SILVER 50+MEN) TABS Take by mouth.    Allergies:   Patient has no known allergies.   Social History   Socioeconomic  History  . Marital status: Single    Spouse name: Not on file  . Number of children: Not on file  . Years of education: Not on file  . Highest education level: Not on file  Occupational History  . Not on file  Tobacco Use  . Smoking status: Never Smoker  . Smokeless tobacco: Never Used  Substance and Sexual Activity  . Alcohol use: Not Currently  . Drug use: Not on file  . Sexual activity: Not on file  Other Topics Concern  . Not on file  Social History Narrative  . Not on file   Social Determinants of Health   Financial Resource Strain: Low Risk   . Difficulty of Paying Living Expenses: Not hard at all  Food Insecurity: No Food Insecurity  . Worried About Charity fundraiser in the Last Year: Never true  . Ran Out of Food in the Last Year: Never true  Transportation Needs: No Transportation Needs  . Lack of Transportation (Medical): No  . Lack of Transportation (Non-Medical): No  Physical Activity: Inactive  . Days of Exercise per Week: 0 days  . Minutes of Exercise per Session: 0 min  Stress: No Stress Concern Present  . Feeling of Stress : Not at all  Social Connections:   . Frequency of Communication with Friends and Family:   . Frequency of Social Gatherings with Friends and Family:   . Attends Religious Services:   . Active Member of Clubs or Organizations:   . Attends Archivist Meetings:   [name] Marital Status:      Family History:  The patient's family history is not on file.  ROS:   Review of Systems  Constitutional: Positive for malaise/fatigue. Negative for chills, diaphoresis, fever and weight loss.  HENT: Negative for congestion.   Eyes: Negative for discharge and redness.  Respiratory: Negative for cough, sputum production, shortness of breath and wheezing.   Cardiovascular: Negative for chest pain, palpitations, orthopnea, claudication, leg swelling and PND.  Gastrointestinal: Negative for abdominal pain, heartburn, nausea and vomiting.    Musculoskeletal: Negative for falls and myalgias.  Skin: Negative for rash.  Neurological: Positive for weakness. Negative for dizziness, tingling, tremors, sensory change, speech change, focal weakness and loss of consciousness.  Endo/Heme/Allergies: Does not bruise/bleed easily.  Psychiatric/Behavioral: Negative for substance abuse. The patient is not nervous/anxious.   All other systems reviewed and are negative.    EKGs/Labs/Other Studies Reviewed:    Studies reviewed were summarized above. The additional studies were reviewed today: As above.  EKG:  EKG is ordered today.  The EKG ordered today demonstrates NSR, 68 bpm, first-degree AV block, LVH, nonspecific ST-T changes  Recent Labs: 01/05/2020: TSH 4.17 02/14/2020: ALT 16 02/15/2020: BUN 43; Creatinine, Ser 2.06; Potassium 4.2; Sodium 140 02/18/2020: Hemoglobin 9.2; Platelets 278.0  Recent Lipid Panel    Component Value Date/Time   CHOL 133 01/05/2020 1028   TRIG 62.0 01/05/2020 1028   HDL 43.70 01/05/2020 1028  CHOLHDL 3 01/05/2020 1028   VLDL 12.4 01/05/2020 1028   LDLCALC 77 01/05/2020 1028    PHYSICAL EXAM:    VS:  BP (!) 152/86 (BP Location: Left Arm, Patient Position: Sitting, Cuff Size: Normal)   Pulse 68   Ht 6' 1.5" (1.867 m)   Wt 144 lb 6 oz (65.5 kg)   SpO2 97%   BMI 18.79 kg/m   BMI: Body mass index is 18.79 kg/m.  Physical Exam  Constitutional: He is oriented to person, place, and time. He appears well-developed and well-nourished.  HENT:  Head: Normocephalic and atraumatic.  Eyes: Right eye exhibits no discharge. Left eye exhibits no discharge.  Neck: No JVD present.  Cardiovascular: Normal rate, regular rhythm, S1 normal, S2 normal and normal heart sounds. Exam reveals no distant heart sounds, no friction rub, no midsystolic click and no opening snap.  No murmur heard. Pulses:      Posterior tibial pulses are 1+ on the right side and 1+ on the left side.  Pulmonary/Chest: Effort normal and  breath sounds normal. No respiratory distress. He has no decreased breath sounds. He has no wheezes. He has no rales. He exhibits no tenderness.  Abdominal: Soft. He exhibits no distension. There is no abdominal tenderness.  Musculoskeletal:        General: No edema.     Cervical back: Normal range of motion.  Neurological: He is alert and oriented to person, place, and time.  Skin: Skin is warm and dry. No cyanosis. Nails show no clubbing.  Psychiatric: He has a normal mood and affect. His speech is normal and behavior is normal. Judgment and thought content normal.    Wt Readings from Last 3 Encounters:  03/09/20 144 lb 6 oz (65.5 kg)  03/03/20 143 lb (64.9 kg)  02/18/20 139 lb 8 oz (63.3 kg)     ASSESSMENT & PLAN:   1. CAD involving the native coronary arteries without angina: He is doing well without any symptoms concerning for angina.  Note indicate he previously had an MI while living in [place] and was medically managed.  In the setting of his comorbid conditions including anemia and CKD he is not a candidate for invasive ischemic evaluation.  Fortunately, he is asymptomatic.  We will continue current medical management with ASA, carvedilol, Imdur, and amlodipine.  No plans for ischemic evaluation at this time.  2. Chronic combined systolic and diasatolic CHF: He appears euvolemic and well compensated.  His cardiomyopathy is likely secondary to hypertensive heart disease.  Though ischemic cardiomyopathy cannot be completely excluded with work-up deferred as outlined above.  Continue current medical therapy with carvedilol and hydralazine/Imdur.  He refuses to resume furosemide and is currently euvolemic.  Not currently on ACE inhibitor/ARB/MRA in the setting of advanced CKD.  CHF education.  3. CKD stage IV: Followed by nephrology.  4. PAD: Followed by vascular surgery.  He denies any lifestyle limiting claudication symptoms.  5. Labile HTN: He appears to be quite sensitive to BP  changes, in particular lower BP readings.  In this setting, we will allow for a slightly more permissive BP with a goal in the 045W systolic.  This was agreed upon by the patient and his sister.  He seems to have trouble with his morning BP being elevated into the 098J systolic.  He typically takes his blood pressure pills between 10 AM and 12 PM and again takes his evening pills around 10-11 PM.  We will have him undergo a  trial of addition of Imdur 15 mg every afternoon and otherwise continue him on current doses of amlodipine, carvedilol, hydralazine, and previously dosed Imdur.  Low-sodium diet.  6. HLD: LDL of 77 from 12/2019.  Not currently on a statin.  This can be revisited in follow-up.  7. Anemia: Uncertain etiology, perhaps anemia of chronic disease with reported prior GI work-up unrevealing as outlined above.  Followed by PCP.  Stable on most recent CBC.  Disposition: F/u with Dr. [name] or an APP in 1 month.   Medication Adjustments/Labs and Tests Ordered: Current medicines are reviewed at length with the patient today.  Concerns regarding medicines are outlined above. Medication changes, Labs and Tests ordered today are summarized above and listed in the Patient Instructions accessible in Encounters.   Signed, Christell Faith, PA-C 03/09/2020 3:39 PM     [address] [place], [place] [address] [phone]

## 2020-03-03 ENCOUNTER — Ambulatory Visit (INDEPENDENT_AMBULATORY_CARE_PROVIDER_SITE_OTHER): Payer: Medicare Other | Admitting: Vascular Surgery

## 2020-03-03 ENCOUNTER — Other Ambulatory Visit: Payer: Self-pay

## 2020-03-03 ENCOUNTER — Encounter: Payer: Medicare Other | Attending: Physician Assistant | Admitting: Physician Assistant

## 2020-03-03 ENCOUNTER — Encounter (INDEPENDENT_AMBULATORY_CARE_PROVIDER_SITE_OTHER): Payer: Self-pay | Admitting: Vascular Surgery

## 2020-03-03 VITALS — BP 190/92 | HR 64 | Ht 71.0 in | Wt 143.0 lb

## 2020-03-03 DIAGNOSIS — I2510 Atherosclerotic heart disease of native coronary artery without angina pectoris: Secondary | ICD-10-CM | POA: Diagnosis not present

## 2020-03-03 DIAGNOSIS — L97529 Non-pressure chronic ulcer of other part of left foot with unspecified severity: Secondary | ICD-10-CM

## 2020-03-03 DIAGNOSIS — I739 Peripheral vascular disease, unspecified: Secondary | ICD-10-CM

## 2020-03-03 DIAGNOSIS — L97319 Non-pressure chronic ulcer of right ankle with unspecified severity: Secondary | ICD-10-CM | POA: Diagnosis present

## 2020-03-03 DIAGNOSIS — I120 Hypertensive chronic kidney disease with stage 5 chronic kidney disease or end stage renal disease: Secondary | ICD-10-CM | POA: Diagnosis not present

## 2020-03-03 DIAGNOSIS — I25118 Atherosclerotic heart disease of native coronary artery with other forms of angina pectoris: Secondary | ICD-10-CM

## 2020-03-03 DIAGNOSIS — I890 Lymphedema, not elsewhere classified: Secondary | ICD-10-CM | POA: Insufficient documentation

## 2020-03-03 DIAGNOSIS — I872 Venous insufficiency (chronic) (peripheral): Secondary | ICD-10-CM | POA: Insufficient documentation

## 2020-03-03 DIAGNOSIS — N186 End stage renal disease: Secondary | ICD-10-CM | POA: Insufficient documentation

## 2020-03-03 DIAGNOSIS — I10 Essential (primary) hypertension: Secondary | ICD-10-CM

## 2020-03-03 DIAGNOSIS — L97522 Non-pressure chronic ulcer of other part of left foot with fat layer exposed: Secondary | ICD-10-CM | POA: Insufficient documentation

## 2020-03-03 DIAGNOSIS — L97312 Non-pressure chronic ulcer of right ankle with fat layer exposed: Secondary | ICD-10-CM | POA: Diagnosis not present

## 2020-03-03 DIAGNOSIS — I252 Old myocardial infarction: Secondary | ICD-10-CM | POA: Diagnosis not present

## 2020-03-03 DIAGNOSIS — L97519 Non-pressure chronic ulcer of other part of right foot with unspecified severity: Secondary | ICD-10-CM

## 2020-03-03 DIAGNOSIS — I129 Hypertensive chronic kidney disease with stage 1 through stage 4 chronic kidney disease, or unspecified chronic kidney disease: Secondary | ICD-10-CM

## 2020-03-03 NOTE — Progress Notes (Signed)
MRN : [id number]  Michael Chang is a 74 y.o. (11/23/1945) male who presents with chief complaint of  Chief Complaint  Patient presents with  . Follow-up    6 week no studies  .  History of Present Illness:   Patient returns for follow-up.  He is now established with the wound care center.  His sister is with him.  She reports that 2 of the 3 wounds have now healed and that the third 1 is almost closed.  He denies pain.  Current Meds  Medication Sig  . amLODipine (NORVASC) 5 MG tablet Take 1 tablet (5 mg total) by mouth daily.  [name] aspirin 81 MG EC tablet Take by mouth.  . carvedilol (COREG) 12.5 MG tablet Take 1 tablet (12.5 mg total) by mouth 2 (two) times daily with a meal.  . ferrous sulfate 325 (65 FE) MG tablet Take 325 mg by mouth daily with breakfast.  . hydrALAZINE (APRESOLINE) 100 MG tablet Take 1 tablet (100 mg total) by mouth 2 (two) times daily.  . isosorbide mononitrate (IMDUR) 60 MG 24 hr tablet Take 1 tablet (60 mg total) by mouth daily. For blood pressure.  . Multiple Vitamin (MULTIVITAMIN WITH MINERALS) TABS tablet Take 1 tablet by mouth daily.  . Multiple Vitamins-Minerals (CENTRUM SILVER 50+MEN) TABS Take by mouth.    Past Medical History:  Diagnosis Date  . AKI (acute kidney injury) ([place])   . CHF (congestive heart failure) ([place])   . Chronic ulcer of right lower extremity ([place])   . CKD (chronic kidney disease)   . NSTEMI (non-ST elevated myocardial infarction) ([place])   . PVD (peripheral vascular disease) ([place])     No past surgical history on file.  Social History Social History   Tobacco Use  . Smoking status: Never Smoker  . Smokeless tobacco: Never Used  Substance Use Topics  . Alcohol use: Not Currently  . Drug use: Not on file    Family History No family history on file.  No Known Allergies   REVIEW OF SYSTEMS (Negative unless checked)  Constitutional: [] Weight loss  [] Fever  [] Chills Cardiac: [] Chest pain   [] Chest pressure    [] Palpitations   [] Shortness of breath when laying flat   [] Shortness of breath with exertion. Vascular:  [x] Pain in legs with walking   [] Pain in legs at rest  [] History of DVT   [] Phlebitis   [] Swelling in legs   [] Varicose veins   [] Non-healing ulcers Pulmonary:   [] Uses home oxygen   [] Productive cough   [] Hemoptysis   [] Wheeze  [] COPD   [] Asthma Neurologic:  [] Dizziness   [] Seizures   [] History of stroke   [] History of TIA  [] Aphasia   [] Vissual changes   [] Weakness or numbness in arm   [] Weakness or numbness in leg Musculoskeletal:   [] Joint swelling   [x] Joint pain   [] Low back pain Hematologic:  [] Easy bruising  [] Easy bleeding   [] Hypercoagulable state   [] Anemic Gastrointestinal:  [] Diarrhea   [] Vomiting  [] Gastroesophageal reflux/heartburn   [] Difficulty swallowing. Genitourinary:  [x] Chronic kidney disease   [] Difficult urination  [] Frequent urination   [] Blood in urine Skin:  [] Rashes   [] Ulcers  Psychological:  [] History of anxiety   []  History of major depression.  Physical Examination  Vitals:   03/03/20 1118  BP: (!) 190/92  Pulse: 64  Weight: 143 lb (64.9 kg)  Height: 5\' 11"  (1.803 m)   Body mass index is 19.94 kg/m. Gen: WD/WN, NAD Head: /AT, No  temporalis wasting.  Ear/Nose/Throat: Hearing grossly intact, nares w/o erythema or drainage Eyes: PER, EOMI, sclera nonicteric.  Neck: Supple, no large masses.   Pulmonary:  Good air movement, no audible wheezing bilaterally, no use of accessory muscles.  Cardiac: RRR, no JVD Vascular:  Vessel Right Left  Radial Palpable Palpable  Gastrointestinal: Non-distended. No guarding/no peritoneal signs.  Musculoskeletal: M/S 5/5 throughout.  No deformity or atrophy.  Neurologic: CN 2-12 intact. Symmetrical.  Speech is fluent. Motor exam as listed above. Psychiatric: Judgment intact, Mood & affect appropriate for pt's clinical situation. Dermatologic: No rashes, + ulcers.  No changes consistent with cellulitis.  CBC Lab  Results  Component Value Date   WBC 6.0 02/18/2020   HGB 9.2 (L) 02/18/2020   HCT 28.3 (L) 02/18/2020   MCV 79.6 02/18/2020   PLT 278.0 02/18/2020    BMET    Component Value Date/Time   NA 140 02/15/2020 0516   K 4.2 02/15/2020 0516   CL 106 02/15/2020 0516   CO2 26 02/15/2020 0516   GLUCOSE 91 02/15/2020 0516   BUN 43 (H) 02/15/2020 0516   CREATININE 2.06 (H) 02/15/2020 0516   CALCIUM 8.6 (L) 02/15/2020 0516   GFRNONAA 31 (L) 02/15/2020 0516   GFRAA 36 (L) 02/15/2020 0516   Estimated Creatinine Clearance: 29.3 mL/min (A) (by C-G formula based on SCr of 2.06 mg/dL (H)).  COAG No results found for: INR, PROTIME  Radiology CT Head Wo Contrast  Result Date: 02/14/2020 CLINICAL DATA:  Head trauma. EXAM: CT HEAD WITHOUT CONTRAST TECHNIQUE: Contiguous axial images were obtained from the base of the skull through the vertex without intravenous contrast. COMPARISON:  None. FINDINGS: Brain: Mild generalized age related parenchymal volume loss with commensurate dilatation of the ventricles and sulci. Chronic small vessel ischemic changes within the bilateral periventricular and subcortical white matter regions. No mass, hemorrhage, edema or other evidence of acute parenchymal abnormality. No extra-axial hemorrhage. Vascular: Chronic calcified atherosclerotic changes of the large vessels at the skull base. No unexpected hyperdense vessel. Skull: Normal. Negative for fracture or focal lesion. Sinuses/Orbits: Chronic appearing frontal sinus disease. Periorbital and retro-orbital soft tissues are unremarkable. Other: Scalp edema/laceration overlying the RIGHT frontal bone. No underlying fracture. IMPRESSION: 1. Scalp edema/laceration overlying the RIGHT frontal bone. No underlying fracture. 2. No acute intracranial abnormality. No intracranial mass, hemorrhage or edema. 3. Chronic small vessel ischemic changes in the white matter. Electronically Signed   By: Franki Cabot M.D.   On: 02/14/2020 04:26    DG Chest Port 1 View  Result Date: 02/14/2020 CLINICAL DATA:  Fall EXAM: PORTABLE CHEST 1 VIEW COMPARISON:  None. FINDINGS: The heart size and mediastinal contours are within normal limits. Both lungs are clear. The visualized skeletal structures are unremarkable. IMPRESSION: No active disease. Electronically Signed   By: Ulyses Jarred M.D.   On: 02/14/2020 03:47   ECHOCARDIOGRAM COMPLETE  Result Date: 02/14/2020    ECHOCARDIOGRAM REPORT   Patient Name:   ANGUS AMINI Date of Exam: 02/14/2020 Medical Rec #:  [id number]      Height:       71.0 in Accession #:    [id number]     Weight:       141.0 lb Date of Birth:  1946-06-22       BSA:          1.817 m Patient Age:    31 years       BP:           136/83  mmHg Patient Gender: M              HR:           56 bpm. Exam Location:  [facility] Procedure: 2D Echo Indications:     Syncope 780.2/ R55  History:         Patient has no prior history of Echocardiogram examinations.  Sonographer:     Arville Go RDCS Referring Phys:  [id number] AGBATA Diagnosing Phys: Ida Rogue MD IMPRESSIONS  1. Left ventricular ejection fraction, by estimation, is 55 to 60%. The left ventricle has normal function. The left ventricle has no regional wall motion abnormalities. There is severe left ventricular hypertrophy. Left ventricular diastolic parameters  are consistent with Grade I diastolic dysfunction (impaired relaxation).  2. Right ventricular systolic function is normal. The right ventricular size is normal.  3. Left atrial size was mildly dilated. FINDINGS  Left Ventricle: Left ventricular ejection fraction, by estimation, is 55 to 60%. The left ventricle has normal function. The left ventricle has no regional wall motion abnormalities. The left ventricular internal cavity size was normal in size. There is  severe left ventricular hypertrophy. Left ventricular diastolic parameters are consistent with Grade I diastolic dysfunction (impaired relaxation). Right Ventricle:  The right ventricular size is normal. No increase in right ventricular wall thickness. Right ventricular systolic function is normal. Left Atrium: Left atrial size was mildly dilated. Right Atrium: Right atrial size was normal in size. Pericardium: There is no evidence of pericardial effusion. Mitral Valve: The mitral valve is normal in structure. Normal mobility of the mitral valve leaflets. No evidence of mitral valve regurgitation. No evidence of mitral valve stenosis. Tricuspid Valve: The tricuspid valve is normal in structure. Tricuspid valve regurgitation is mild . No evidence of tricuspid stenosis. Aortic Valve: The aortic valve is normal in structure. Aortic valve regurgitation is not visualized. No aortic stenosis is present. Aortic valve peak gradient measures 8.4 mmHg. Pulmonic Valve: The pulmonic valve was normal in structure. Pulmonic valve regurgitation is not visualized. No evidence of pulmonic stenosis. Aorta: The aortic root is normal in size and structure. Venous: The inferior vena cava is normal in size with greater than 50% respiratory variability, suggesting right atrial pressure of 3 mmHg. IAS/Shunts: No atrial level shunt detected by color flow Doppler.  LEFT VENTRICLE PLAX 2D LVIDd:         4.52 cm  Diastology LVIDs:         3.05 cm  LV e' lateral:   4.24 cm/s LV PW:         1.90 cm  LV E/e' lateral: 15.4 LV IVS:        2.31 cm  LV e' medial:    4.79 cm/s LVOT diam:     2.20 cm  LV E/e' medial:  13.6 LV SV:         73 LV SV Index:   40 LVOT Area:     3.80 cm  RIGHT VENTRICLE RV Basal diam:  3.21 cm RV S prime:     15.40 cm/s TAPSE (M-mode): 2.4 cm LEFT ATRIUM             Index       RIGHT ATRIUM           Index LA diam:        4.50 cm 2.48 cm/m  RA Area:     17.70 cm LA Vol (A2C):   59.1 ml 32.52 ml/m RA Volume:  45.70 ml  25.14 ml/m LA Vol (A4C):   66.1 ml 36.37 ml/m LA Biplane Vol: 68.6 ml 37.74 ml/m  AORTIC VALVE                PULMONIC VALVE AV Area (Vmax): 2.50 cm    PV Vmax:        1.05 m/s AV Vmax:        145.00 cm/s PV Peak grad:  4.4 mmHg AV Peak Grad:   8.4 mmHg LVOT Vmax:      95.30 cm/s LVOT Vmean:     60.300 cm/s LVOT VTI:       0.191 m  AORTA Ao Root diam: 3.10 cm Ao Asc diam:  3.20 cm MITRAL VALVE MV Area (PHT): 2.83 cm    SHUNTS MV Decel Time: 268 msec    Systemic VTI:  0.19 m MV E velocity: 65.10 cm/s  Systemic Diam: 2.20 cm MV A velocity: 87.00 cm/s MV E/A ratio:  0.75 Ida Rogue MD Electronically signed by Ida Rogue MD Signature Date/Time: 02/14/2020/4:20:34 PM    Final      Assessment/Plan 1. PAD (peripheral artery disease) (HCC)  Recommend:  The patient has evidence of atherosclerosis of the lower extremities with claudication.  The patient does not voice lifestyle limiting changes at this point in time.  Noninvasive studies do not suggest clinically significant change.  No invasive studies, angiography or surgery at this time The patient should continue walking and begin a more formal exercise program.  The patient should continue antiplatelet therapy and aggressive treatment of the lipid abnormalities  No changes in the patient's medications at this time  The patient should continue wearing graduated compression socks 10-15 mmHg strength to control the mild edema.   - VAS [place] ABI WITH/WO TBI; Future  2. Skin ulcers of foot, bilateral ([place]) Healing continue with wound center  3. Coronary artery disease of native artery of native heart with stable angina pectoris (HCC) Continue cardiac and antihypertensive medications as already ordered and reviewed, no changes at this time.  Continue statin as ordered and reviewed, no changes at this time  Nitrates PRN for chest pain   4. Benign hypertensive kidney disease with chronic kidney disease stage I through stage IV, or unspecified At the present time the patient does not require dialysis.  Continue f/u with Nephrology as ordered.  Avoid nephrotoxic medications and  dehydration.  Further plans per nephrology  5. Essential hypertension Continue antihypertensive medications as already ordered, these medications have been reviewed and there are no changes at this time.    Hortencia Pilar, MD  03/03/2020 11:33 AM

## 2020-03-03 NOTE — Progress Notes (Addendum)
SHANNA, STRENGTH ([id number]) Visit Report for 03/03/2020 Chief Complaint Document Details Patient Name: Michael Chang, Michael Chang Date of Service: 03/03/2020 3:15 PM Medical Record Number: [id number] Patient Account Number: [id number] Date of Birth/Sex: [birth year] (74 y.o. M) Treating RN: Army Melia Primary Care Provider: Alma Friendly Other Clinician: Referring Provider: Alma Friendly Treating Provider/Extender: Melburn Hake, Pam Vanalstine Weeks in Treatment: 8 Information Obtained from: Patient Chief Complaint Right foot and ankle ulcers Electronic Signature(s) Signed: 03/03/2020 3:25:06 PM By: Worthy Keeler PA-C Entered By: Worthy Keeler on 03/03/2020 15:25:06 [name] ([id number]) -------------------------------------------------------------------------------- HPI Details Patient Name: [name] Date of Service: 03/03/2020 3:15 PM Medical Record Number: [id number] Patient Account Number: [id number] Date of Birth/Sex: 1946-09-30 (74 y.o. M) Treating RN: Army Melia Primary Care Provider: Alma Friendly Other Clinician: Referring Provider: Alma Friendly Treating Provider/Extender: Melburn Hake, Laria Grimmett Weeks in Treatment: 8 History of Present Illness HPI Description: 01/07/2020 patient presents today for initial evaluation in our clinic concerning issues that he has been having with ulcers on his bilateral feet. More specifically at his left foot and his right ankle and on the ankle it is medial and lateral. With that being said these wounds have apparently been present for a significant amount of time in fact the patient states "20 years". When I questioned this and even ask his family member that was with him today she states that the last time she saw them was around 2006 and they were not this bad. Nonetheless I think he has had this for a significant amount of time although I am unsure of the exact locations of wounds and if some have come and gone. He does have chronic venous  insufficiency, lymphedema, hypertension, and has had a heart attack in the recent past few months. Subsequently he just moved from [place] to this area to live with family. His hypertension has been very uncontrolled he does see Dr. [name] and was placed on amlodipine 5 mg. With that being said unfortunately this does not seem according to family to have helped much with his blood pressure currently though to be honest it somewhat hard to know for sure as he had not taken the medicine before he came in today his blood pressure was significantly elevated but apparently it was when he saw his primary on Tuesday as well but then by the time he took his medicine got to the cardiologist this was much lower dropping from around 416 systolic to around 606. Nonetheless my suggestion in this regard is can be for her to keep a track of his blood pressure readings morning noon and night in order to see how this goes throughout the day for the next week and then we will address it following. In regard to the wounds we are going to see what we can do as far as helping with this currently. He also goes to vascular for testing later today. 01/14/2020 upon evaluation today patient's wound actually appears to be doing quite well in general at all locations. He has a lot of new epithelial growth which is great news. Fortunately there is no signs of active infection at this time. He did have his arterial study on the right this was a ABI of 1.44 with a TBI of 1.10 on the left an ABI of 1.46 with a TBI of 0.93. The good news is he has no signs of arterial insufficiency. The patient also did have blood pressure readings that his family member took over the past week morning and evening. Consistently  he was much lower in the evening than he was in the morning. They feel like that his amlodipine probably needs to be increased again I explained that I would give there cardiologist, Dr. [name], a call but that that is not  something that I would manage here at the wound care center as far as his blood pressure is concerned. Be more than happy to relay that information to Dr. Tyrell Antonio office however. 01/29/2020 upon evaluation today patient appears to be doing well in general with regard to his wounds. With that being said on the medial aspect he does have some purulent drainage noted which is somewhat reminiscent of potential Pseudomonas although there could be something else going on here as well. Nonetheless I think we do need to obtain a culture also think that antibiotics are probably can be necessary at this point. 02/05/2020 upon evaluation today patient appears to be doing excellent in regard to his wounds. In fact 2 wounds are completely healed as of today just the remaining right medial ankle ulcer is still open but appears to be doing much better there is no evidence of infection today he has been on the antibiotic, Levaquin, that I prescribed for him last week which was appropriate for the infection which showed positive for Morganella and Staphylococcus. 5/21. Still with an extensive area on the right medial ankle. We have been using silver collagen under Tubigrip 02/26/2020 upon evaluation today patient appears to be doing well in regard to his wounds at this point. In fact he just has 1 remaining currently and this actually showed signs of good epithelial growth. There does not appear to be any evidence of active infection at this time which is excellent news overall the patient is very pleased with how things seem to be progressing. He is having no pain at this point. 03/03/2020 upon evaluation today patient actually appears to be doing excellent in regard to his leg ulcer. This is showing signs of great improvement and overall very pleased with how things seem to be progressing. Overall he continues to make progress week by week. Electronic Signature(s) Signed: 03/03/2020 3:42:35 PM By: Worthy Keeler  PA-C Entered By: Worthy Keeler on 03/03/2020 15:42:34 VICKI, PASQUAL ([id number]) -------------------------------------------------------------------------------- Physical Exam Details Patient Name: [name] Date of Service: 03/03/2020 3:15 PM Medical Record Number: [id number] Patient Account Number: [id number] Date of Birth/Sex: [birth year] (73 y.o. M) Treating RN: Army Melia Primary Care Provider: Alma Friendly Other Clinician: Referring Provider: Alma Friendly Treating Provider/Extender: Melburn Hake, Harmonii Karle Weeks in Treatment: 8 Constitutional Well-nourished and well-hydrated in no acute distress. Respiratory normal breathing without difficulty. Psychiatric this patient is able to make decisions and demonstrates good insight into disease process. Alert and Oriented x 3. pleasant and cooperative. Notes Patient's wound bed currently showed signs of good granulation at this time there does not appear to be any evidence of significant slough buildup I do not think he needs any sharp debridement today. In the future if he does we will obviously address that while in a week by week basis. There is no evidence of infection. Electronic Signature(s) Signed: 03/03/2020 3:42:52 PM By: Worthy Keeler PA-C Entered By: Worthy Keeler on 03/03/2020 15:42:51 [name] ([id number]) -------------------------------------------------------------------------------- Physician Orders Details Patient Name: [name] Date of Service: 03/03/2020 3:15 PM Medical Record Number: [id number] Patient Account Number: [id number] Date of Birth/Sex: [birth year] (73 y.o. 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 o Cleanse wound with mild soap and water Primary Wound Dressing Wound #2 Right,Medial Ankle o Hydrafera Blue Ready Transfer Secondary Dressing Wound #2 Right,Medial Ankle o ABD and Kerlix/Conform Dressing Change Frequency Wound #2 Right,Medial Ankle o Change dressing every other day. Follow-up Appointments o Return Appointment in 1 week. Edema Control Wound #2 Right,Medial Ankle o Other: - tubi grip E bilateral Electronic Signature(s) Signed: 03/03/2020 3:48:05 PM By: Army Melia Signed: 03/04/2020 6:04:00 PM By: Worthy Keeler PA-C Entered By: Army Melia on 03/03/2020 15:40:26 [name] ([id number]) -------------------------------------------------------------------------------- Problem List Details Patient Name: [name] Date of Service: 03/03/2020 3:15 PM Medical Record Number: [id number] Patient Account Number: [id number] Date of Birth/Sex: 25-Jan-1946 (73 y.o. M) Treating RN: Army Melia Primary Care Provider: Alma Friendly Other Clinician: Referring Provider: Alma Friendly Treating Provider/Extender: Melburn Hake, Kiril Hippe Weeks in Treatment: 8 Active Problems ICD-10 Encounter Code Description Active Date MDM Diagnosis I87.2 Venous insufficiency (chronic) (peripheral) 01/07/2020 No Yes I89.0 Lymphedema, not elsewhere classified 01/07/2020 No Yes L97.522 Non-pressure chronic ulcer of other part of left foot with fat layer 01/07/2020 No Yes exposed L97.312 Non-pressure chronic ulcer of right ankle with fat layer exposed 01/07/2020 No Yes I10 Essential (primary)  hypertension 01/07/2020 No Yes I25.10 Atherosclerotic heart disease of native coronary artery without angina 01/07/2020 No Yes pectoris Inactive Problems Resolved Problems Electronic Signature(s) Signed: 03/03/2020 3:25:01 PM By: Worthy Keeler PA-C Entered By: Worthy Keeler on 03/03/2020 15:25:00 [name] ([id number]) -------------------------------------------------------------------------------- Progress Note Details Patient Name: [name] Date of Service: 03/03/2020 3:15 PM Medical Record Number: [id number] Patient Account Number: [id number] Date of Birth/Sex: 1946-08-10 (73 y.o. M) Treating RN: Army Melia Primary Care Provider: Alma Friendly Other Clinician: Referring Provider: Alma Friendly Treating Provider/Extender: Melburn Hake, Ascencion Coye Weeks in Treatment: 8 Subjective Chief Complaint Information obtained from Patient Right foot and ankle ulcers History of Present Illness (HPI) 01/07/2020 patient presents today for initial evaluation in our clinic concerning issues that he has been having with ulcers on his bilateral feet. More specifically at his left foot and his right ankle and on the ankle it is medial and lateral. With that being said these wounds have apparently been present for a significant amount of time in fact the patient states "20 years". When I questioned this and even ask his family member that was with him today she states that the last time she saw them was around 2006 and they were not this bad. Nonetheless I think he has had this for a significant amount of time although I am unsure of the exact locations of wounds and if some have come and gone. He does have chronic venous insufficiency, lymphedema, hypertension, and has had a heart attack in the recent past few months. Subsequently he just moved from [place] to this area to live with family. His hypertension has been very uncontrolled he does see Dr. [name] and was placed on amlodipine 5 mg. With  that being said unfortunately this does not seem according to family to have helped much with his blood  pressure currently though to be honest it somewhat hard to know for sure as he had not taken the medicine before he came in today his blood pressure was significantly elevated but apparently it was when he saw his primary on Tuesday as well but then by the time he took his medicine got to the cardiologist this was much lower dropping from around 161 systolic to around 096. Nonetheless my suggestion in this regard is can be for her to keep a track of his blood pressure readings morning noon and night in order to see how this goes throughout the day for the next week and then we will address it following. In regard to the wounds we are going to see what we can do as far as helping with this currently. He also goes to vascular for testing later today. 01/14/2020 upon evaluation today patient's wound actually appears to be doing quite well in general at all locations. He has a lot of new epithelial growth which is great news. Fortunately there is no signs of active infection at this time. He did have his arterial study on the right this was a ABI of 1.44 with a TBI of 1.10 on the left an ABI of 1.46 with a TBI of 0.93. The good news is he has no signs of arterial insufficiency. The patient also did have blood pressure readings that his family member took over the past week morning and evening. Consistently he was much lower in the evening than he was in the morning. They feel like that his amlodipine probably needs to be increased again I explained that I would give there cardiologist, Dr. [name], a call but that that is not something that I would manage here at the wound care center as far as his blood pressure is concerned. Be more than happy to relay that information to Dr. Tyrell Antonio office however. 01/29/2020 upon evaluation today patient appears to be doing well in general with regard to his wounds.  With that being said on the medial aspect he does have some purulent drainage noted which is somewhat reminiscent of potential Pseudomonas although there could be something else going on here as well. Nonetheless I think we do need to obtain a culture also think that antibiotics are probably can be necessary at this point. 02/05/2020 upon evaluation today patient appears to be doing excellent in regard to his wounds. In fact 2 wounds are completely healed as of today just the remaining right medial ankle ulcer is still open but appears to be doing much better there is no evidence of infection today he has been on the antibiotic, Levaquin, that I prescribed for him last week which was appropriate for the infection which showed positive for Morganella and Staphylococcus. 5/21. Still with an extensive area on the right medial ankle. We have been using silver collagen under Tubigrip 02/26/2020 upon evaluation today patient appears to be doing well in regard to his wounds at this point. In fact he just has 1 remaining currently and this actually showed signs of good epithelial growth. There does not appear to be any evidence of active infection at this time which is excellent news overall the patient is very pleased with how things seem to be progressing. He is having no pain at this point. 03/03/2020 upon evaluation today patient actually appears to be doing excellent in regard to his leg ulcer. This is showing signs of great improvement and overall very pleased with how things seem to be progressing.  Overall he continues to make progress week by week. Objective Constitutional Well-nourished and well-hydrated in no acute distress. Vitals Time Taken: 3:10 PM, Height: 71 in, Weight: 138 lbs, BMI: 19.2, Temperature: 98.5 F, Pulse: 67 bpm, Respiratory Rate: 16 breaths/min, Blood Pressure: 175/92 mmHg. Respiratory Dirk, Jaise ([id number]) normal breathing without difficulty. Psychiatric this patient is  able to make decisions and demonstrates good insight into disease process. Alert and Oriented x 3. pleasant and cooperative. General Notes: Patient's wound bed currently showed signs of good granulation at this time there does not appear to be any evidence of significant slough buildup I do not think he needs any sharp debridement today. In the future if he does we will obviously address that while in a week by week basis. There is no evidence of infection. Integumentary (Hair, Skin) Wound #2 status is Open. Original cause of wound was Gradually Appeared. The wound is located on the Right,Medial Ankle. The wound measures 2.9cm length x 1.8cm width x 0.2cm depth; 4.1cm^2 area and 0.82cm^3 volume. There is Fat Layer (Subcutaneous Tissue) Exposed exposed. There is no tunneling or undermining noted. There is a medium amount of serous drainage noted. The wound margin is thickened. There is large (67-100%) pink granulation within the wound bed. There is a small (1-33%) amount of necrotic tissue within the wound bed including Adherent Slough. 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 Cleanse wound with mild soap and water Primary Wound Dressing: Wound #2 Right,Medial Ankle: Hydrafera Blue Ready Transfer Secondary Dressing: Wound #2 Right,Medial Ankle: ABD and Kerlix/Conform Dressing Change Frequency: Wound #2 Right,Medial Ankle: Change dressing every other day. Follow-up Appointments: Return Appointment in 1 week. Edema Control: Wound #2 Right,Medial Ankle: Other: - tubi grip E  bilateral 1. I would recommend currently that we going continue with the wound care measures as before. This includes utilization of the [facility] dressing that the patient seems to be doing excellent with at this time. 2. I would also recommend we continue with the Tubigrip that the patient has done very well with up to this point. 3. I would also suggest that he continue to monitor for any signs of infection when they change the dressing fortunately there is no evidence of active infection at this point. We will see patient back for reevaluation in 1 week here in the clinic. If anything worsens or changes patient will contact our office for additional recommendations. Electronic Signature(s) Signed: 03/03/2020 3:43:31 PM By: Worthy Keeler PA-C Entered By: Worthy Keeler on 03/03/2020 15:43:31 [place], Chrissie Noa ([id number]) BREYLON, SHERROW ([id number]) -------------------------------------------------------------------------------- SuperBill Details Patient Name: [name] Date of Service: 03/03/2020 Medical Record Number: [id number] Patient Account Number: [id number] Date of Birth/Sex: Feb 07, 1946 (73 y.o. M) Treating RN: Army Melia Primary Care Provider: Alma Friendly Other Clinician: Referring Provider: Alma Friendly Treating Provider/Extender: Melburn Hake, Ronnae Kaser Weeks in Treatment: 8 Diagnosis Coding ICD-10 Codes Code Description I87.2 Venous insufficiency (chronic) (peripheral) I89.0 Lymphedema, not elsewhere classified L97.522 Non-pressure chronic ulcer of other part of left foot with fat layer exposed L97.312 Non-pressure chronic ulcer of right ankle with fat layer exposed [place] (primary) hypertension I25.10 Atherosclerotic heart disease of native coronary artery without angina pectoris Facility Procedures CPT4  Code: [id number] Description: 32023 - WOUND CARE VISIT-LEV 3 EST PT Modifier: Quantity: 1 Physician Procedures CPT4 Code: [id number] Description:  16837 - WC PHYS LEVEL 3 - EST PT Modifier: Quantity: 1 CPT4 Code: Description: ICD-10 Diagnosis Description I87.2 Venous insufficiency (chronic) (peripheral) I89.0 Lymphedema, not elsewhere classified L97.522 Non-pressure chronic ulcer of other part of left foot with fat layer ex L97.312 Non-pressure chronic ulcer of  right ankle with fat layer exposed Modifier: posed Quantity: Electronic Signature(s) Signed: 03/03/2020 3:43:43 PM By: Worthy Keeler PA-C Entered By: Worthy Keeler on 03/03/2020 15:43:43

## 2020-03-03 NOTE — Progress Notes (Signed)
CHANCELLOR, VANDERLOOP ([id number]) Visit Report for 03/03/2020 Arrival Information Details Patient Name: Michael Chang, Michael Chang Date of Service: 03/03/2020 3:15 PM Medical Record Number: [id number] Patient Account Number: [id number] Date of Birth/Sex: 09-17-1946 (74 y.o. M) Treating RN: Army Melia Primary Care Ma Munoz: Alma Friendly Other Clinician: Referring Eunie Lawn: Alma Friendly Treating Romayne Ticas/Extender: Melburn Hake, HOYT Weeks in Treatment: 8 Visit Information History Since Last Visit Added or deleted any medications: No Patient Arrived: Wheel Chair Any new allergies or adverse reactions: No Arrival Time: 15:14 Had a fall or experienced change in No Accompanied By: sister activities of daily living that may affect Transfer Assistance: None risk of falls: Patient Identification Verified: Yes Signs or symptoms of abuse/neglect since last visito No Secondary Verification Process Completed: Yes Hospitalized since last visit: No Implantable device outside of the clinic excluding No cellular tissue based products placed in the center since last visit: Has Dressing in Place as Prescribed: Yes Pain Present Now: No Electronic Signature(s) Signed: 03/03/2020 4:08:09 PM By: Lorine Bears RCP, RRT, CHT Entered By: Lorine Bears on 03/03/2020 15:14:50 Michael Chang ([id number]) -------------------------------------------------------------------------------- Clinic Level of Care Assessment Details Patient Name: Michael Chang Date of Service: 03/03/2020 3:15 PM Medical Record Number: [id number] Patient Account Number: [id number] Date of Birth/Sex: January 24, 1946 (74 y.o. M) Treating RN: Army Melia Primary Care Kory Rains: Alma Friendly Other Clinician: Referring Orli Degrave: Alma Friendly Treating Lynx Goodrich/Extender: Melburn Hake, HOYT Weeks in Treatment: 8 Clinic Level of Care Assessment Items TOOL 4 Quantity Score []  - Use when only an EandM is performed on FOLLOW-UP  visit 0 ASSESSMENTS - Nursing Assessment / Reassessment X - Reassessment of Co-morbidities (includes updates in patient status) 1 10 X- 1 5 Reassessment of Adherence to Treatment Plan ASSESSMENTS - Wound and Skin Assessment / Reassessment X - Simple Wound Assessment / Reassessment - one wound 1 5 []  - 0 Complex Wound Assessment / Reassessment - multiple wounds []  - 0 Dermatologic / Skin Assessment (not related to wound area) ASSESSMENTS - Focused Assessment []  - Circumferential Edema Measurements - multi extremities 0 []  - 0 Nutritional Assessment / Counseling / Intervention []  - 0 Lower Extremity Assessment (monofilament, tuning fork, pulses) []  - 0 Peripheral Arterial Disease Assessment (using hand held doppler) ASSESSMENTS - Ostomy and/or Continence Assessment and Care []  - Incontinence Assessment and Management 0 []  - 0 Ostomy Care Assessment and Management (repouching, etc.) PROCESS - Coordination of Care X - Simple Patient / Family Education for ongoing care 1 15 []  - 0 Complex (extensive) Patient / Family Education for ongoing care []  - 0 Staff obtains Programmer, systems, Records, Test Results / Process Orders []  - 0 Staff telephones HHA, Nursing Homes / Clarify orders / etc []  - 0 Routine Transfer to another Facility (non-emergent condition) []  - 0 Routine Hospital Admission (non-emergent condition) []  - 0 New Admissions / Biomedical engineer / Ordering NPWT, Apligraf, etc. []  - 0 Emergency Hospital Admission (emergent condition) X- 1 10 Simple Discharge Coordination []  - 0 Complex (extensive) Discharge Coordination PROCESS - Special Needs []  - Pediatric / Minor Patient Management 0 []  - 0 Isolation Patient Management []  - 0 Hearing / Language / Visual special needs []  - 0 Assessment of Community assistance (transportation, D/C planning, etc.) []  - 0 Additional assistance / Altered mentation []  - 0 Support Surface(s) Assessment (bed, cushion, seat,  etc.) INTERVENTIONS - Wound Cleansing / Measurement Righter, Refugio ([id number]) X- 1 5 Simple Wound Cleansing - one wound []  - 0 Complex Wound Cleansing - multiple wounds X- 1 5 Wound Imaging (  photographs - any number of wounds) []  - 0 Wound Tracing (instead of photographs) X- 1 5 Simple Wound Measurement - one wound []  - 0 Complex Wound Measurement - multiple wounds INTERVENTIONS - Wound Dressings []  - Small Wound Dressing one or multiple wounds 0 X- 1 15 Medium Wound Dressing one or multiple wounds []  - 0 Large Wound Dressing one or multiple wounds []  - 0 Application of Medications - topical []  - 0 Application of Medications - injection INTERVENTIONS - Miscellaneous []  - External ear exam 0 []  - 0 Specimen Collection (cultures, biopsies, blood, body fluids, etc.) []  - 0 Specimen(s) / Culture(s) sent or taken to Lab for analysis []  - 0 Patient Transfer (multiple staff / Civil Service fast streamer / Similar devices) []  - 0 Simple Staple / Suture removal (25 or less) []  - 0 Complex Staple / Suture removal (26 or more) []  - 0 Hypo / Hyperglycemic Management (close monitor of Blood Glucose) []  - 0 Ankle / Brachial Index (ABI) - do not check if billed separately X- 1 5 Vital Signs Has the patient been seen at the hospital within the last three years: Yes Total Score: 80 Level Of Care: New/Established - Level 3 Electronic Signature(s) Signed: 03/03/2020 3:48:05 PM By: Army Melia Entered By: Army Melia on 03/03/2020 15:40:46 Michael Chang ([id number]) -------------------------------------------------------------------------------- Encounter Discharge Information Details Patient Name: Michael Chang Date of Service: 03/03/2020 3:15 PM Medical Record Number: [id number] Patient Account Number: [id number] Date of Birth/Sex: Feb 24, 1946 (74 y.o. M) Treating RN: Army Melia Primary Care Babacar Haycraft: Alma Friendly Other Clinician: Referring Jeena Arnett: Alma Friendly Treating  Azreal Stthomas/Extender: Melburn Hake, HOYT Weeks in Treatment: 8 Encounter Discharge Information Items Discharge Condition: Stable Ambulatory Status: Ambulatory Discharge Destination: Home Transportation: Private Auto Accompanied By: daughter Schedule Follow-up Appointment: Yes Clinical Summary of Care: Electronic Signature(s) Signed: 03/03/2020 3:48:05 PM By: Army Melia Entered By: Army Melia on 03/03/2020 15:41:28 Michael Chang ([id number]) -------------------------------------------------------------------------------- Lower Extremity Assessment Details Patient Name: Michael Chang Date of Service: 03/03/2020 3:15 PM Medical Record Number: [id number] Patient Account Number: [id number] Date of Birth/Sex: [birth year] (73 y.o. M) Treating RN: Montey Hora Primary Care Shebra Muldrow: Alma Friendly Other Clinician: Referring Jerrell Mangel: Alma Friendly Treating Wanya Bangura/Extender: STONE III, HOYT Weeks in Treatment: 8 Edema Assessment Assessed: [Left: No] [Right: No] Edema: [Left: Ye] [Right: s] Vascular Assessment Pulses: Dorsalis Pedis Palpable: [Right:Yes] Electronic Signature(s) Signed: 03/03/2020 4:34:20 PM By: Montey Hora Entered By: Montey Hora on 03/03/2020 15:24:54 Michael Chang ([id number]) -------------------------------------------------------------------------------- Multi Wound Chart Details Patient Name: Michael Chang Date of Service: 03/03/2020 3:15 PM Medical Record Number: [id number] Patient Account Number: [id number] Date of Birth/Sex: Aug 25, 1946 (74 y.o. M) Treating RN: Army Melia Primary Care Masai Kidd: Alma Friendly Other Clinician: Referring Mayce Noyes: Alma Friendly Treating Rashun Grattan/Extender: Melburn Hake, HOYT Weeks in Treatment: 8 Vital Signs Height(in): 71 Pulse(bpm): 25 Weight(lbs): 138 Blood Pressure(mmHg): 175/92 Body Mass Index(BMI): 19 Temperature(F): 98.5 Respiratory Rate(breaths/min): 16 Photos: [N/A:N/A] Wound Location: Right, Medial  Ankle N/A N/A Wounding Event: Gradually Appeared N/A N/A Primary Etiology: Venous Leg Ulcer N/A N/A Comorbid History: Hypertension, Myocardial Infarction, N/A N/A Peripheral Venous Disease, End Stage Renal Disease Date Acquired: 01/07/2019 N/A N/A Weeks of Treatment: 8 N/A N/A Wound Status: Open N/A N/A Measurements L x W x D (cm) 2.9x1.8x0.2 N/A N/A Area (cm) : 4.1 N/A N/A Volume (cm) : 0.82 N/A N/A % Reduction in Area: 87.50% N/A N/A % Reduction in Volume: 93.70% N/A N/A Classification: Full Thickness Without Exposed N/A N/A Support Structures Exudate Amount: Medium N/A N/A Exudate Type: Serous N/A N/A  Exudate Color: amber N/A N/A Wound Margin: Thickened N/A N/A Granulation Amount: Large (67-100%) N/A N/A Granulation Quality: Pink N/A N/A Necrotic Amount: Small (1-33%) N/A N/A Exposed Structures: Fat Layer (Subcutaneous Tissue) N/A N/A Exposed: Yes Fascia: No Tendon: No Muscle: No Joint: No Bone: No Epithelialization: None N/A N/A Treatment Notes Electronic Signature(s) Signed: 03/03/2020 3:48:05 PM By: Army Melia Entered By: Army Melia on 03/03/2020 15:39:20 Michael Chang ([id number]) -------------------------------------------------------------------------------- [place] Details Patient Name: Michael Chang Date of Service: 03/03/2020 3:15 PM Medical Record Number: [id number] Patient Account Number: [id number] Date of Birth/Sex: 1946-07-25 (73 y.o. M) Treating RN: Army Melia Primary Care Jayani Rozman: Alma Friendly Other Clinician: Referring Mykhia Danish: Alma Friendly Treating Handsome Anglin/Extender: Melburn Hake, HOYT Weeks in Treatment: 8 Active Inactive Orientation to the Wound Care Program Nursing Diagnoses: Knowledge deficit related to the wound healing center program Goals: Patient/caregiver will verbalize understanding of the [place] Program Date Initiated: 01/08/2020 Target Resolution Date: 02/05/2020 Goal Status:  Active Interventions: Provide education on orientation to the wound center Notes: Venous Leg Ulcer Nursing Diagnoses: Potential for venous Insuffiency (use before diagnosis confirmed) Goals: Patient/caregiver will verbalize understanding of disease process and disease management Date Initiated: 01/08/2020 Target Resolution Date: 02/05/2020 Goal Status: Active Interventions: Assess peripheral edema status every visit. Notes: Wound/Skin Impairment Nursing Diagnoses: Impaired tissue integrity Goals: Ulcer/skin breakdown will have a volume reduction of 30% by week 4 Date Initiated: 01/08/2020 Target Resolution Date: 02/05/2020 Goal Status: Active Interventions: Assess ulceration(s) every visit Notes: Electronic Signature(s) Signed: 03/03/2020 3:48:05 PM By: Army Melia Entered By: Army Melia on 03/03/2020 15:39:11 Michael Chang ([id number]) -------------------------------------------------------------------------------- Pain Assessment Details Patient Name: Michael Chang Date of Service: 03/03/2020 3:15 PM Medical Record Number: [id number] Patient Account Number: [id number] Date of Birth/Sex: Nov 28, 1945 (73 y.o. M) Treating RN: Montey Hora Primary Care Inman Fettig: Alma Friendly Other Clinician: Referring Desarae Placide: Alma Friendly Treating Ovidio Steele/Extender: Melburn Hake, HOYT Weeks in Treatment: 8 Active Problems Location of Pain Severity and Description of Pain Patient Has Paino No Site Locations Pain Management and Medication Current Pain Management: Electronic Signature(s) Signed: 03/03/2020 4:34:20 PM By: Montey Hora Entered By: Montey Hora on 03/03/2020 15:22:15 Michael Chang ([id number]) -------------------------------------------------------------------------------- Patient/Caregiver Education Details Patient Name: Michael Chang Date of Service: 03/03/2020 3:15 PM Medical Record Number: [id number] Patient Account Number: [id number] Date of Birth/Gender:  1946/05/04 (73 y.o. M) Treating RN: Army Melia Primary Care Physician: Alma Friendly Other Clinician: Referring Physician: Alma Friendly Treating Physician/Extender: Sharalyn Ink in Treatment: 8 Education Assessment Education Provided To: Patient Education Topics Provided Wound/Skin Impairment: Handouts: Caring for Your Ulcer Methods: Demonstration, Explain/Verbal Responses: State content correctly Electronic Signature(s) Signed: 03/03/2020 3:48:05 PM By: Army Melia Entered By: Army Melia on 03/03/2020 15:41:00 Michael Chang ([id number]) -------------------------------------------------------------------------------- Wound Assessment Details Patient Name: Michael Chang Date of Service: 03/03/2020 3:15 PM Medical Record Number: [id number] Patient Account Number: [id number] Date of Birth/Sex: 1946-01-20 (73 y.o. M) Treating RN: Montey Hora Primary Care Galya Dunnigan: Alma Friendly Other Clinician: Referring Ezelle Surprenant: Alma Friendly Treating Agnieszka Newhouse/Extender: Melburn Hake, HOYT Weeks in Treatment: 8 Wound Status Wound Number: 2 Primary Venous Leg Ulcer Etiology: Wound Location: Right, Medial Ankle Wound Open Wounding Event: Gradually Appeared Status: Date Acquired: 01/07/2019 Comorbid Hypertension, Myocardial Infarction, Peripheral Venous Weeks Of Treatment: 8 History: Disease, End Stage Renal Disease Clustered Wound: No Photos Wound Measurements Length: (cm) 2.9 Width: (cm) 1.8 Depth: (cm) 0.2 Area: (cm) 4.1 Volume: (cm) 0.82 % Reduction in Area: 87.5% % Reduction in Volume: 93.7% Epithelialization: None Tunneling: No Undermining: No Wound Description Classification: Full  Thickness Without Exposed Support Structu Wound Margin: Thickened Exudate Amount: Medium Exudate Type: Serous Exudate Color: amber res Foul Odor After Cleansing: No Slough/Fibrino Yes Wound Bed Granulation Amount: Large (67-100%) Exposed Structure Granulation Quality:  Pink Fascia Exposed: No Necrotic Amount: Small (1-33%) Fat Layer (Subcutaneous Tissue) Exposed: Yes Necrotic Quality: Adherent Slough Tendon Exposed: No Muscle Exposed: No Joint Exposed: No Bone Exposed: No Treatment Notes Wound #2 (Right, Medial Ankle) Notes hydrofera blue, ABD, conform, tubi Electronic Signature(s) Signed: 03/03/2020 4:34:20 PM By: Anne Shutter ([id number]) Entered By: Montey Hora on 03/03/2020 15:26:46 Michael Chang ([id number]) -------------------------------------------------------------------------------- Vitals Details Patient Name: Michael Chang Date of Service: 03/03/2020 3:15 PM Medical Record Number: [id number] Patient Account Number: [id number] Date of Birth/Sex: [birth year] (74 y.o. M) Treating RN: Army Melia Primary Care Altair Appenzeller: Alma Friendly Other Clinician: Referring Baxter Gonzalez: Alma Friendly Treating Jearldean Gutt/Extender: Melburn Hake, HOYT Weeks in Treatment: 8 Vital Signs Time Taken: 15:10 Temperature (F): 98.5 Height (in): 71 Pulse (bpm): 67 Weight (lbs): 138 Respiratory Rate (breaths/min): 16 Body Mass Index (BMI): 19.2 Blood Pressure (mmHg): 175/92 Reference Range: 80 - 120 mg / dl Electronic Signature(s) Signed: 03/03/2020 4:08:09 PM By: Lorine Bears RCP, RRT, CHT Entered By: Lorine Bears on 03/03/2020 15:15:20

## 2020-03-04 DIAGNOSIS — I2510 Atherosclerotic heart disease of native coronary artery without angina pectoris: Secondary | ICD-10-CM | POA: Diagnosis not present

## 2020-03-04 DIAGNOSIS — I130 Hypertensive heart and chronic kidney disease with heart failure and stage 1 through stage 4 chronic kidney disease, or unspecified chronic kidney disease: Secondary | ICD-10-CM | POA: Diagnosis not present

## 2020-03-04 DIAGNOSIS — R55 Syncope and collapse: Secondary | ICD-10-CM | POA: Diagnosis not present

## 2020-03-04 DIAGNOSIS — M545 Low back pain: Secondary | ICD-10-CM | POA: Diagnosis not present

## 2020-03-04 DIAGNOSIS — D509 Iron deficiency anemia, unspecified: Secondary | ICD-10-CM | POA: Diagnosis not present

## 2020-03-04 DIAGNOSIS — H269 Unspecified cataract: Secondary | ICD-10-CM | POA: Diagnosis not present

## 2020-03-04 DIAGNOSIS — I252 Old myocardial infarction: Secondary | ICD-10-CM | POA: Diagnosis not present

## 2020-03-04 DIAGNOSIS — N183 Chronic kidney disease, stage 3 unspecified: Secondary | ICD-10-CM | POA: Diagnosis not present

## 2020-03-04 DIAGNOSIS — S0181XD Laceration without foreign body of other part of head, subsequent encounter: Secondary | ICD-10-CM | POA: Diagnosis not present

## 2020-03-04 DIAGNOSIS — W19XXXD Unspecified fall, subsequent encounter: Secondary | ICD-10-CM | POA: Diagnosis not present

## 2020-03-04 DIAGNOSIS — I5022 Chronic systolic (congestive) heart failure: Secondary | ICD-10-CM | POA: Diagnosis not present

## 2020-03-04 DIAGNOSIS — L97319 Non-pressure chronic ulcer of right ankle with unspecified severity: Secondary | ICD-10-CM | POA: Diagnosis not present

## 2020-03-04 DIAGNOSIS — I739 Peripheral vascular disease, unspecified: Secondary | ICD-10-CM | POA: Diagnosis not present

## 2020-03-07 ENCOUNTER — Encounter: Payer: Self-pay | Admitting: Physician Assistant

## 2020-03-08 ENCOUNTER — Ambulatory Visit: Payer: Medicare Other | Admitting: Physician Assistant

## 2020-03-08 ENCOUNTER — Ambulatory Visit (HOSPITAL_COMMUNITY): Payer: Medicare Other

## 2020-03-08 ENCOUNTER — Other Ambulatory Visit: Payer: Self-pay

## 2020-03-08 ENCOUNTER — Ambulatory Visit
Admission: RE | Admit: 2020-03-08 | Discharge: 2020-03-08 | Disposition: A | Discharge status: Home / Self Care | Payer: Medicare Other | Source: Ambulatory Visit | Attending: Nephrology | Admitting: Nephrology

## 2020-03-08 DIAGNOSIS — N189 Chronic kidney disease, unspecified: Secondary | ICD-10-CM | POA: Insufficient documentation

## 2020-03-08 DIAGNOSIS — D631 Anemia in chronic kidney disease: Secondary | ICD-10-CM | POA: Insufficient documentation

## 2020-03-08 DIAGNOSIS — N281 Cyst of kidney, acquired: Secondary | ICD-10-CM | POA: Diagnosis not present

## 2020-03-09 ENCOUNTER — Ambulatory Visit (INDEPENDENT_AMBULATORY_CARE_PROVIDER_SITE_OTHER): Payer: Medicare Other | Admitting: Physician Assistant

## 2020-03-09 ENCOUNTER — Encounter: Payer: Self-pay | Admitting: Physician Assistant

## 2020-03-09 VITALS — BP 152/86 | HR 68 | Ht 73.5 in | Wt 144.4 lb

## 2020-03-09 DIAGNOSIS — R0989 Other specified symptoms and signs involving the circulatory and respiratory systems: Secondary | ICD-10-CM

## 2020-03-09 DIAGNOSIS — D509 Iron deficiency anemia, unspecified: Secondary | ICD-10-CM | POA: Diagnosis not present

## 2020-03-09 DIAGNOSIS — I5042 Chronic combined systolic (congestive) and diastolic (congestive) heart failure: Secondary | ICD-10-CM | POA: Diagnosis not present

## 2020-03-09 DIAGNOSIS — I5022 Chronic systolic (congestive) heart failure: Secondary | ICD-10-CM | POA: Diagnosis not present

## 2020-03-09 DIAGNOSIS — E782 Mixed hyperlipidemia: Secondary | ICD-10-CM

## 2020-03-09 DIAGNOSIS — D649 Anemia, unspecified: Secondary | ICD-10-CM

## 2020-03-09 DIAGNOSIS — N183 Chronic kidney disease, stage 3 unspecified: Secondary | ICD-10-CM | POA: Diagnosis not present

## 2020-03-09 DIAGNOSIS — N184 Chronic kidney disease, stage 4 (severe): Secondary | ICD-10-CM | POA: Diagnosis not present

## 2020-03-09 DIAGNOSIS — I252 Old myocardial infarction: Secondary | ICD-10-CM | POA: Diagnosis not present

## 2020-03-09 DIAGNOSIS — I739 Peripheral vascular disease, unspecified: Secondary | ICD-10-CM | POA: Diagnosis not present

## 2020-03-09 DIAGNOSIS — M545 Low back pain: Secondary | ICD-10-CM | POA: Diagnosis not present

## 2020-03-09 DIAGNOSIS — H269 Unspecified cataract: Secondary | ICD-10-CM | POA: Diagnosis not present

## 2020-03-09 DIAGNOSIS — S0181XD Laceration without foreign body of other part of head, subsequent encounter: Secondary | ICD-10-CM | POA: Diagnosis not present

## 2020-03-09 DIAGNOSIS — L97319 Non-pressure chronic ulcer of right ankle with unspecified severity: Secondary | ICD-10-CM | POA: Diagnosis not present

## 2020-03-09 DIAGNOSIS — W19XXXD Unspecified fall, subsequent encounter: Secondary | ICD-10-CM | POA: Diagnosis not present

## 2020-03-09 DIAGNOSIS — R55 Syncope and collapse: Secondary | ICD-10-CM | POA: Diagnosis not present

## 2020-03-09 DIAGNOSIS — I2510 Atherosclerotic heart disease of native coronary artery without angina pectoris: Secondary | ICD-10-CM

## 2020-03-09 DIAGNOSIS — I130 Hypertensive heart and chronic kidney disease with heart failure and stage 1 through stage 4 chronic kidney disease, or unspecified chronic kidney disease: Secondary | ICD-10-CM | POA: Diagnosis not present

## 2020-03-09 MED ORDER — ISOSORBIDE MONONITRATE ER 30 MG PO TB24
ORAL_TABLET | ORAL | 6 refills | Status: DC
Start: 2020-03-09 — End: 2020-09-19

## 2020-03-09 NOTE — Patient Instructions (Signed)
Medication Instructions:   Your physician has recommended you make the following change in your medication:   Increase your IMDUR (Isosorbide Mononitrate): Take 2 tablets by mouth in the AM, and take 1/2 tablet by mouth in the PM  *If you need a refill on your cardiac medications before your next appointment, please call your pharmacy*   Lab Work: None Ordered If you have labs (blood work) drawn today and your tests are completely normal, you will receive your results only by: [name] MyChart Message (if you have MyChart) OR . A paper copy in the mail If you have any lab test that is abnormal or we need to change your treatment, we will call you to review the results.   Testing/Procedures: None Ordered.   Follow-Up: At [facility], you and your health needs are our priority.  As part of our continuing mission to provide you with exceptional heart care, we have created designated Provider Care Teams.  These Care Teams include your primary Cardiologist (physician) and Advanced Practice Providers (APPs -  Physician Assistants and Nurse Practitioners) who all work together to provide you with the care you need, when you need it.  We recommend signing up for the patient portal called "MyChart".  Sign up information is provided on this After Visit Summary.  MyChart is used to connect with patients for Virtual Visits (Telemedicine).  Patients are able to view lab/test results, encounter notes, upcoming appointments, etc.  Non-urgent messages can be sent to your provider as well.   To learn more about what you can do with MyChart, go to [email or web address].    Your next appointment:   1 month(s)  The format for your next appointment:   In Person  Provider:     Christell Faith, PA-C    Other Instructions N/A

## 2020-03-10 DIAGNOSIS — S0181XD Laceration without foreign body of other part of head, subsequent encounter: Secondary | ICD-10-CM | POA: Diagnosis not present

## 2020-03-10 DIAGNOSIS — I130 Hypertensive heart and chronic kidney disease with heart failure and stage 1 through stage 4 chronic kidney disease, or unspecified chronic kidney disease: Secondary | ICD-10-CM | POA: Diagnosis not present

## 2020-03-10 DIAGNOSIS — L97319 Non-pressure chronic ulcer of right ankle with unspecified severity: Secondary | ICD-10-CM | POA: Diagnosis not present

## 2020-03-10 DIAGNOSIS — N183 Chronic kidney disease, stage 3 unspecified: Secondary | ICD-10-CM | POA: Diagnosis not present

## 2020-03-10 DIAGNOSIS — D509 Iron deficiency anemia, unspecified: Secondary | ICD-10-CM | POA: Diagnosis not present

## 2020-03-10 DIAGNOSIS — H269 Unspecified cataract: Secondary | ICD-10-CM | POA: Diagnosis not present

## 2020-03-10 DIAGNOSIS — W19XXXD Unspecified fall, subsequent encounter: Secondary | ICD-10-CM | POA: Diagnosis not present

## 2020-03-10 DIAGNOSIS — I252 Old myocardial infarction: Secondary | ICD-10-CM | POA: Diagnosis not present

## 2020-03-10 DIAGNOSIS — I5022 Chronic systolic (congestive) heart failure: Secondary | ICD-10-CM | POA: Diagnosis not present

## 2020-03-10 DIAGNOSIS — R55 Syncope and collapse: Secondary | ICD-10-CM | POA: Diagnosis not present

## 2020-03-10 DIAGNOSIS — M545 Low back pain: Secondary | ICD-10-CM | POA: Diagnosis not present

## 2020-03-10 DIAGNOSIS — I2510 Atherosclerotic heart disease of native coronary artery without angina pectoris: Secondary | ICD-10-CM | POA: Diagnosis not present

## 2020-03-10 DIAGNOSIS — I739 Peripheral vascular disease, unspecified: Secondary | ICD-10-CM | POA: Diagnosis not present

## 2020-03-11 ENCOUNTER — Other Ambulatory Visit: Payer: Self-pay

## 2020-03-11 ENCOUNTER — Encounter: Payer: Medicare Other | Admitting: Physician Assistant

## 2020-03-11 DIAGNOSIS — I252 Old myocardial infarction: Secondary | ICD-10-CM | POA: Diagnosis not present

## 2020-03-11 DIAGNOSIS — I890 Lymphedema, not elsewhere classified: Secondary | ICD-10-CM | POA: Diagnosis not present

## 2020-03-11 DIAGNOSIS — I120 Hypertensive chronic kidney disease with stage 5 chronic kidney disease or end stage renal disease: Secondary | ICD-10-CM | POA: Diagnosis not present

## 2020-03-11 DIAGNOSIS — N186 End stage renal disease: Secondary | ICD-10-CM | POA: Diagnosis not present

## 2020-03-11 DIAGNOSIS — L97522 Non-pressure chronic ulcer of other part of left foot with fat layer exposed: Secondary | ICD-10-CM | POA: Diagnosis not present

## 2020-03-11 DIAGNOSIS — L97312 Non-pressure chronic ulcer of right ankle with fat layer exposed: Secondary | ICD-10-CM | POA: Diagnosis not present

## 2020-03-11 DIAGNOSIS — I872 Venous insufficiency (chronic) (peripheral): Secondary | ICD-10-CM | POA: Diagnosis not present

## 2020-03-11 DIAGNOSIS — I2510 Atherosclerotic heart disease of native coronary artery without angina pectoris: Secondary | ICD-10-CM | POA: Diagnosis not present

## 2020-03-11 NOTE — Progress Notes (Addendum)
BHARAT, ANTILLON ([id number]) Visit Report for 03/11/2020 Arrival Information Details Patient Name: Michael, Chang Date of Service: 03/11/2020 11:00 AM Medical Record Number: [id number] Patient Account Number: [id number] Date of Birth/Sex: 1946-04-26 (74 y.o. M) Treating RN: Montey Hora Primary Care Kordel Leavy: Alma Friendly Other Clinician: Referring Marquis Diles: Alma Friendly Treating Marquize Seib/Extender: Melburn Hake, HOYT Weeks in Treatment: 9 Visit Information History Since Last Visit Added or deleted any medications: Yes Patient Arrived: Wheel Chair Any new allergies or adverse reactions: No Arrival Time: 11:00 Had a fall or experienced change in No Accompanied By: sister activities of daily living that may affect Transfer Assistance: None risk of falls: Patient Identification Verified: Yes Signs or symptoms of abuse/neglect since last visito No Secondary Verification Process Completed: Yes Hospitalized since last visit: No Implantable device outside of the clinic excluding No cellular tissue based products placed in the center since last visit: Has Dressing in Place as Prescribed: Yes Pain Present Now: No Electronic Signature(s) Signed: 03/11/2020 11:58:11 AM By: Lorine Bears RCP, RRT, CHT Entered By: Lorine Bears on 03/11/2020 11:01:26 Michael Chang ([id number]) -------------------------------------------------------------------------------- Clinic Level of Care Assessment Details Patient Name: Michael, Chang Date of Service: 03/11/2020 11:00 AM Medical Record Number: [id number] Patient Account Number: [id number] Date of Birth/Sex: Aug 10, 1946 (73 y.o. M) Treating RN: Montey Hora Primary Care Zyire Eidson: Alma Friendly Other Clinician: Referring Lilymae Swiech: Alma Friendly Treating Jasdeep Dejarnett/Extender: Melburn Hake, HOYT Weeks in Treatment: 9 Clinic Level of Care Assessment Items TOOL 4 Quantity Score []  - Use when only an EandM is performed  on FOLLOW-UP visit 0 ASSESSMENTS - Nursing Assessment / Reassessment X - Reassessment of Co-morbidities (includes updates in patient status) 1 10 X- 1 5 Reassessment of Adherence to Treatment Plan ASSESSMENTS - Wound and Skin Assessment / Reassessment X - Simple Wound Assessment / Reassessment - one wound 1 5 []  - 0 Complex Wound Assessment / Reassessment - multiple wounds []  - 0 Dermatologic / Skin Assessment (not related to wound area) ASSESSMENTS - Focused Assessment []  - Circumferential Edema Measurements - multi extremities 0 []  - 0 Nutritional Assessment / Counseling / Intervention X- 1 5 Lower Extremity Assessment (monofilament, tuning fork, pulses) []  - 0 Peripheral Arterial Disease Assessment (using hand held doppler) ASSESSMENTS - Ostomy and/or Continence Assessment and Care []  - Incontinence Assessment and Management 0 []  - 0 Ostomy Care Assessment and Management (repouching, etc.) PROCESS - Coordination of Care X - Simple Patient / Family Education for ongoing care 1 15 []  - 0 Complex (extensive) Patient / Family Education for ongoing care X- 1 10 Staff obtains Programmer, systems, Records, Test Results / Process Orders []  - 0 Staff telephones HHA, Nursing Homes / Clarify orders / etc []  - 0 Routine Transfer to another Facility (non-emergent condition) []  - 0 Routine Hospital Admission (non-emergent condition) []  - 0 New Admissions / Biomedical engineer / Ordering NPWT, Apligraf, etc. []  - 0 Emergency Hospital Admission (emergent condition) X- 1 10 Simple Discharge Coordination []  - 0 Complex (extensive) Discharge Coordination PROCESS - Special Needs []  - Pediatric / Minor Patient Management 0 []  - 0 Isolation Patient Management []  - 0 Hearing / Language / Visual special needs []  - 0 Assessment of Community assistance (transportation, D/C planning, etc.) []  - 0 Additional assistance / Altered mentation []  - 0 Support Surface(s) Assessment (bed, cushion,  seat, etc.) INTERVENTIONS - Wound Cleansing / Measurement Rex, Semir ([id number]) X- 1 5 Simple Wound Cleansing - one wound []  - 0 Complex Wound Cleansing - multiple wounds X- 1 5 Wound Imaging (  photographs - any number of wounds) []  - 0 Wound Tracing (instead of photographs) X- 1 5 Simple Wound Measurement - one wound []  - 0 Complex Wound Measurement - multiple wounds INTERVENTIONS - Wound Dressings []  - Small Wound Dressing one or multiple wounds 0 X- 1 15 Medium Wound Dressing one or multiple wounds []  - 0 Large Wound Dressing one or multiple wounds []  - 0 Application of Medications - topical []  - 0 Application of Medications - injection INTERVENTIONS - Miscellaneous []  - External ear exam 0 []  - 0 Specimen Collection (cultures, biopsies, blood, body fluids, etc.) []  - 0 Specimen(s) / Culture(s) sent or taken to Lab for analysis []  - 0 Patient Transfer (multiple staff / Civil Service fast streamer / Similar devices) []  - 0 Simple Staple / Suture removal (25 or less) []  - 0 Complex Staple / Suture removal (26 or more) []  - 0 Hypo / Hyperglycemic Management (close monitor of Blood Glucose) []  - 0 Ankle / Brachial Index (ABI) - do not check if billed separately X- 1 5 Vital Signs Has the patient been seen at the hospital within the last three years: Yes Total Score: 95 Level Of Care: New/Established - Level 3 Electronic Signature(s) Signed: 03/11/2020 3:58:18 PM By: Montey Hora Entered By: Montey Hora on 03/11/2020 11:30:09 Michael Chang ([id number]) -------------------------------------------------------------------------------- Encounter Discharge Information Details Patient Name: Michael Chang Date of Service: 03/11/2020 11:00 AM Medical Record Number: [id number] Patient Account Number: [id number] Date of Birth/Sex: 1945-12-29 (73 y.o. M) Treating RN: Montey Hora Primary Care Liviana Mills: Alma Friendly Other Clinician: Referring Brisia Schuermann: Alma Friendly Treating Shereece Wellborn/Extender: Melburn Hake, HOYT Weeks in Treatment: 9 Encounter Discharge Information Items Discharge Condition: Stable Ambulatory Status: Wheelchair Discharge Destination: Home Transportation: Private Auto Accompanied By: sister Schedule Follow-up Appointment: Yes Clinical Summary of Care: Electronic Signature(s) Signed: 03/11/2020 3:58:18 PM By: Montey Hora Entered By: Montey Hora on 03/11/2020 11:30:55 Michael Chang ([id number]) -------------------------------------------------------------------------------- Lower Extremity Assessment Details Patient Name: Michael Chang Date of Service: 03/11/2020 11:00 AM Medical Record Number: [id number] Patient Account Number: [id number] Date of Birth/Sex: 1946/08/24 (73 y.o. M) Treating RN: Army Melia Primary Care Kadeisha Betsch: Alma Friendly Other Clinician: Referring Ashlei Chinchilla: Alma Friendly Treating Pierce Biagini/Extender: STONE III, HOYT Weeks in Treatment: 9 Edema Assessment Assessed: [Left: No] [Right: No] Edema: [Left: N] [Right: o] Vascular Assessment Pulses: Dorsalis Pedis Palpable: [Right:Yes] Electronic Signature(s) Signed: 03/11/2020 3:19:07 PM By: Army Melia Entered By: Army Melia on 03/11/2020 11:13:00 Michael Chang ([id number]) -------------------------------------------------------------------------------- Multi Wound Chart Details Patient Name: Michael Chang Date of Service: 03/11/2020 11:00 AM Medical Record Number: [id number] Patient Account Number: [id number] Date of Birth/Sex: Sep 21, 1946 (73 y.o. M) Treating RN: Montey Hora Primary Care Blong Busk: Alma Friendly Other Clinician: Referring Collen Vincent: Alma Friendly Treating Paralee Pendergrass/Extender: Melburn Hake, HOYT Weeks in Treatment: 9 Vital Signs Height(in): 71 Pulse(bpm): 62 Weight(lbs): 138 Blood Pressure(mmHg): 152/79 Body Mass Index(BMI): 19 Temperature(F): 98.3 Respiratory Rate(breaths/min): 16 Photos: [N/A:N/A] Wound  Location: Right, Medial Ankle N/A N/A Wounding Event: Gradually Appeared N/A N/A Primary Etiology: Venous Leg Ulcer N/A N/A Comorbid History: Hypertension, Myocardial Infarction, N/A N/A Peripheral Venous Disease, End Stage Renal Disease Date Acquired: 01/07/2019 N/A N/A Weeks of Treatment: 9 N/A N/A Wound Status: Open N/A N/A Measurements L x W x D (cm) 2.8x2.2x0.2 N/A N/A Area (cm) : 4.838 N/A N/A Volume (cm) : 0.968 N/A N/A % Reduction in Area: 85.20% N/A N/A % Reduction in Volume: 92.60% N/A N/A Classification: Full Thickness Without Exposed N/A N/A Support Structures Exudate Amount: Medium N/A N/A Exudate Type: Serous N/A N/A  Exudate Color: amber N/A N/A Wound Margin: Thickened N/A N/A Granulation Amount: Large (67-100%) N/A N/A Granulation Quality: Pink N/A N/A Necrotic Amount: Small (1-33%) N/A N/A Exposed Structures: Fat Layer (Subcutaneous Tissue) N/A N/A Exposed: Yes Fascia: No Tendon: No Muscle: No Joint: No Bone: No Epithelialization: None N/A N/A Treatment Notes Electronic Signature(s) Signed: 03/11/2020 3:58:18 PM By: Montey Hora Entered By: Montey Hora on 03/11/2020 11:28:52 Michael Chang ([id number]) -------------------------------------------------------------------------------- Multi-Disciplinary Care Plan Details Patient Name: Michael Chang Date of Service: 03/11/2020 11:00 AM Medical Record Number: [id number] Patient Account Number: [id number] Date of Birth/Sex: 1946-04-08 (73 y.o. M) Treating RN: Montey Hora Primary Care Allex Lapoint: Alma Friendly Other Clinician: Referring Tamyra Fojtik: Alma Friendly Treating Krimson Massmann/Extender: Melburn Hake, HOYT Weeks in Treatment: 9 Active Inactive Orientation to the Wound Care Program Nursing Diagnoses: Knowledge deficit related to the wound healing center program Goals: Patient/caregiver will verbalize understanding of the [place] Program Date Initiated: 01/08/2020 Target Resolution Date:  02/05/2020 Goal Status: Active Interventions: Provide education on orientation to the wound center Notes: Venous Leg Ulcer Nursing Diagnoses: Potential for venous Insuffiency (use before diagnosis confirmed) Goals: Patient/caregiver will verbalize understanding of disease process and disease management Date Initiated: 01/08/2020 Target Resolution Date: 02/05/2020 Goal Status: Active Interventions: Assess peripheral edema status every visit. Notes: Wound/Skin Impairment Nursing Diagnoses: Impaired tissue integrity Goals: Ulcer/skin breakdown will have a volume reduction of 30% by week 4 Date Initiated: 01/08/2020 Target Resolution Date: 02/05/2020 Goal Status: Active Interventions: Assess ulceration(s) every visit Notes: Electronic Signature(s) Signed: 03/11/2020 3:58:18 PM By: Montey Hora Entered By: Montey Hora on 03/11/2020 11:26:49 Michael Chang ([id number]) -------------------------------------------------------------------------------- Pain Assessment Details Patient Name: Michael Chang Date of Service: 03/11/2020 11:00 AM Medical Record Number: [id number] Patient Account Number: [id number] Date of Birth/Sex: 1945/12/09 (73 y.o. M) Treating RN: Army Melia Primary Care Amenah Tucci: Alma Friendly Other Clinician: Referring Guled Gahan: Alma Friendly Treating Viva Gallaher/Extender: Melburn Hake, HOYT Weeks in Treatment: 9 Active Problems Location of Pain Severity and Description of Pain Patient Has Paino No Site Locations Pain Management and Medication Current Pain Management: Electronic Signature(s) Signed: 03/11/2020 3:19:07 PM By: Army Melia Entered By: Army Melia on 03/11/2020 11:10:53 Michael Chang ([id number]) -------------------------------------------------------------------------------- Patient/Caregiver Education Details Patient Name: Michael Chang Date of Service: 03/11/2020 11:00 AM Medical Record Number: [id number] Patient Account Number:  [id number] Date of Birth/Gender: [birth year] (74 y.o. M) Treating RN: Montey Hora Primary Care Physician: Alma Friendly Other Clinician: Referring Physician: Alma Friendly Treating Physician/Extender: Sharalyn Ink in Treatment: 9 Education Assessment Education Provided To: Patient and Caregiver Education Topics Provided Wound/Skin Impairment: Handouts: Other: wound care as ordered Methods: Explain/Verbal Responses: State content correctly Electronic Signature(s) Signed: 03/11/2020 3:58:18 PM By: Montey Hora Entered By: Montey Hora on 03/11/2020 11:30:26 Michael Chang ([id number]) -------------------------------------------------------------------------------- Wound Assessment Details Patient Name: Michael Chang Date of Service: 03/11/2020 11:00 AM Medical Record Number: [id number] Patient Account Number: [id number] Date of Birth/Sex: 1945/12/31 (73 y.o. M) Treating RN: Montey Hora Primary Care Fusae Florio: Alma Friendly Other Clinician: Referring Bryan Omura: Alma Friendly Treating Charvez Voorhies/Extender: Melburn Hake, HOYT Weeks in Treatment: 9 Wound Status Wound Number: 2 Primary Venous Leg Ulcer Etiology: Wound Location: Right, Medial Ankle Wound Open Wounding Event: Gradually Appeared Status: Date Acquired: 01/07/2019 Comorbid Hypertension, Myocardial Infarction, Peripheral Venous Weeks Of Treatment: 9 History: Disease, End Stage Renal Disease Clustered Wound: No Photos Wound Measurements Length: (cm) 2.8 Width: (cm) 2.2 Depth: (cm) 0.2 Area: (cm) 4.838 Volume: (cm) 0.968 % Reduction in Area: 85.2% % Reduction in Volume: 92.6% Epithelialization: None Wound Description Classification: Full Thickness Without  Exposed Support Structu Wound Margin: Thickened Exudate Amount: Medium Exudate Type: Serous Exudate Color: amber res Foul Odor After Cleansing: No Slough/Fibrino Yes Wound Bed Granulation Amount: Large (67-100%) Exposed  Structure Granulation Quality: Pink Fascia Exposed: No Necrotic Amount: Small (1-33%) Fat Layer (Subcutaneous Tissue) Exposed: Yes Necrotic Quality: Adherent Slough Tendon Exposed: No Muscle Exposed: No Joint Exposed: No Bone Exposed: No Treatment Notes Wound #2 (Right, Medial Ankle) Notes hydrofera blue, ABD, conform, tubi Electronic Signature(s) Signed: 03/11/2020 3:58:18 PM By: Jannette Fogo, Chrissie Noa ([id number]) Entered By: Montey Hora on 03/11/2020 11:28:42 Michael Chang ([id number]) -------------------------------------------------------------------------------- Vitals Details Patient Name: Michael Chang Date of Service: 03/11/2020 11:00 AM Medical Record Number: [id number] Patient Account Number: [id number] Date of Birth/Sex: [birth year] (73 y.o. M) Treating RN: Army Melia Primary Care Payton Prinsen: Alma Friendly Other Clinician: Referring Jasai Sorg: Alma Friendly Treating Amadeus Oyama/Extender: Melburn Hake, HOYT Weeks in Treatment: 9 Vital Signs Time Taken: 11:10 Temperature (F): 98.3 Height (in): 71 Pulse (bpm): 67 Weight (lbs): 138 Respiratory Rate (breaths/min): 16 Body Mass Index (BMI): 19.2 Blood Pressure (mmHg): 152/79 Reference Range: 80 - 120 mg / dl Electronic Signature(s) Signed: 03/11/2020 3:19:07 PM By: Army Melia Entered By: Army Melia on 03/11/2020 11:10:48

## 2020-03-11 NOTE — Progress Notes (Addendum)
[name], [name] ([id number]) Visit Report for 03/11/2020 Chief Complaint Document Details Patient Name: Michael Chang Date of Service: 03/11/2020 11:00 AM Medical Record Number: [id number] Patient Account Number: [id number] Date of Birth/Sex: [birth year] (74 y.o. M) Treating RN: Montey Hora Primary Care Provider: Alma Friendly Other Clinician: Referring Provider: Alma Friendly Treating Provider/Extender: Melburn Hake, Jalan Fariss Weeks in Treatment: 9 Information Obtained from: Patient Chief Complaint Right foot and ankle ulcers Electronic Signature(s) Signed: 03/11/2020 11:00:17 AM By: Worthy Keeler PA-C Entered By: Worthy Keeler on 03/11/2020 11:00:16 Michael Chang ([id number]) -------------------------------------------------------------------------------- HPI Details Patient Name: Michael Chang Date of Service: 03/11/2020 11:00 AM Medical Record Number: [id number] Patient Account Number: [id number] Date of Birth/Sex: May 29, 1946 (73 y.o. M) Treating RN: Montey Hora Primary Care Provider: Alma Friendly Other Clinician: Referring Provider: Alma Friendly Treating Provider/Extender: Melburn Hake, Aleena Kirkeby Weeks in Treatment: 9 History of Present Illness HPI Description: 01/07/2020 patient presents today for initial evaluation in our clinic concerning issues that he has been having with ulcers on his bilateral feet. More specifically at his left foot and his right ankle and on the ankle it is medial and lateral. With that being said these wounds have apparently been present for a significant amount of time in fact the patient states "20 years". When I questioned this and even ask his family member that was with him today she states that the last time she saw them was around 2006 and they were not this bad. Nonetheless I think he has had this for a significant amount of time although I am unsure of the exact locations of wounds and if some have come and gone. He does have chronic venous  insufficiency, lymphedema, hypertension, and has had a heart attack in the recent past few months. Subsequently he just moved from [place] to this area to live with family. His hypertension has been very uncontrolled he does see Dr. [name] and was placed on amlodipine 5 mg. With that being said unfortunately this does not seem according to family to have helped much with his blood pressure currently though to be honest it somewhat hard to know for sure as he had not taken the medicine before he came in today his blood pressure was significantly elevated but apparently it was when he saw his primary on Tuesday as well but then by the time he took his medicine got to the cardiologist this was much lower dropping from around 263 systolic to around 785. Nonetheless my suggestion in this regard is can be for her to keep a track of his blood pressure readings morning noon and night in order to see how this goes throughout the day for the next week and then we will address it following. In regard to the wounds we are going to see what we can do as far as helping with this currently. He also goes to vascular for testing later today. 01/14/2020 upon evaluation today patient's wound actually appears to be doing quite well in general at all locations. He has a lot of new epithelial growth which is great news. Fortunately there is no signs of active infection at this time. He did have his arterial study on the right this was a ABI of 1.44 with a TBI of 1.10 on the left an ABI of 1.46 with a TBI of 0.93. The good news is he has no signs of arterial insufficiency. The patient also did have blood pressure readings that his family member took over the past week morning and evening. Consistently  he was much lower in the evening than he was in the morning. They feel like that his amlodipine probably needs to be increased again I explained that I would give there cardiologist, Dr. [name], a call but that that is not  something that I would manage here at the wound care center as far as his blood pressure is concerned. Be more than happy to relay that information to Dr. Tyrell Antonio office however. 01/29/2020 upon evaluation today patient appears to be doing well in general with regard to his wounds. With that being said on the medial aspect he does have some purulent drainage noted which is somewhat reminiscent of potential Pseudomonas although there could be something else going on here as well. Nonetheless I think we do need to obtain a culture also think that antibiotics are probably can be necessary at this point. 02/05/2020 upon evaluation today patient appears to be doing excellent in regard to his wounds. In fact 2 wounds are completely healed as of today just the remaining right medial ankle ulcer is still open but appears to be doing much better there is no evidence of infection today he has been on the antibiotic, Levaquin, that I prescribed for him last week which was appropriate for the infection which showed positive for Morganella and Staphylococcus. 5/21. Still with an extensive area on the right medial ankle. We have been using silver collagen under Tubigrip 02/26/2020 upon evaluation today patient appears to be doing well in regard to his wounds at this point. In fact he just has 1 remaining currently and this actually showed signs of good epithelial growth. There does not appear to be any evidence of active infection at this time which is excellent news overall the patient is very pleased with how things seem to be progressing. He is having no pain at this point. 03/03/2020 upon evaluation today patient actually appears to be doing excellent in regard to his leg ulcer. This is showing signs of great improvement and overall very pleased with how things seem to be progressing. Overall he continues to make progress week by week. 03/11/2020 upon evaluation today patient appears to be doing well at this time in  regard to his ulcer. He has been tolerating the dressing changes without complication. Fortunately there is no evidence of infection and overall feel like he is progressing quite nicely. Electronic Signature(s) Signed: 03/11/2020 12:52:38 PM By: Worthy Keeler PA-C Entered By: Worthy Keeler on 03/11/2020 12:52:37 KANI, CHAUVIN ([id number]) -------------------------------------------------------------------------------- Physical Exam Details Patient Name: Michael Chang Date of Service: 03/11/2020 11:00 AM Medical Record Number: [id number] Patient Account Number: [id number] Date of Birth/Sex: 08-Apr-1946 (73 y.o. M) Treating RN: Montey Hora Primary Care Provider: Alma Friendly Other Clinician: Referring Provider: Alma Friendly Treating Provider/Extender: Melburn Hake, Marsden Zaino Weeks in Treatment: 9 Constitutional Well-nourished and well-hydrated in no acute distress. Respiratory normal breathing without difficulty. Psychiatric this patient is able to make decisions and demonstrates good insight into disease process. Alert and Oriented x 3. pleasant and cooperative. Notes Patient's wound bed currently showed signs of excellent granulation at this time in good epithelial growth there is no evidence of significant slough buildup that I could not debride away mechanically with saline gauze he tolerated that without complication. Electronic Signature(s) Signed: 03/11/2020 12:52:51 PM By: Worthy Keeler PA-C Entered By: Worthy Keeler on 03/11/2020 12:52:51 Michael Chang ([id number]) -------------------------------------------------------------------------------- Physician Orders Details Patient Name: Michael Chang Date of Service: 03/11/2020 11:00 AM Medical Record Number: [id number] Patient Account Number: [id number] Date  of Birth/Sex: Feb 05, 1946 (74 y.o. 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 o Cleanse wound with mild soap and water Primary Wound Dressing Wound #2 Right,Medial Ankle o Hydrafera Blue Ready Transfer Secondary Dressing Wound #2 Right,Medial Ankle o ABD and Kerlix/Conform Dressing Change Frequency Wound #2 Right,Medial Ankle o Change dressing every other day. Follow-up Appointments o Return Appointment in 1 week. Edema Control Wound #2 Right,Medial Ankle o Other: - tubi grip E bilateral Electronic Signature(s) Signed: 03/11/2020 3:56:00 PM By: Worthy Keeler PA-C Signed: 03/11/2020 3:58:18 PM By: Montey Hora Entered By: Montey Hora on 03/11/2020 11:29:41 Michael Chang ([id number]) -------------------------------------------------------------------------------- Problem List Details Patient Name: Michael Chang Date of Service: 03/11/2020 11:00 AM Medical Record Number: [id number] Patient Account Number: [id number] Date of Birth/Sex: 06/25/1946 (73 y.o. M) Treating RN: Montey Hora Primary Care Provider: Alma Friendly Other Clinician: Referring Provider: Alma Friendly Treating Provider/Extender: Melburn Hake, Gerry Heaphy Weeks in Treatment: 9 Active Problems ICD-10 Encounter Code Description Active Date MDM Diagnosis I87.2 Venous insufficiency (chronic) (peripheral) 01/07/2020 No Yes I89.0 Lymphedema, not elsewhere classified 01/07/2020 No  Yes L97.522 Non-pressure chronic ulcer of other part of left foot with fat layer 01/07/2020 No Yes exposed L97.312 Non-pressure chronic ulcer of right ankle with fat layer exposed 01/07/2020 No Yes I10 Essential (primary) hypertension 01/07/2020 No Yes I25.10 Atherosclerotic heart disease of native coronary artery without angina 01/07/2020 No Yes pectoris Inactive Problems Resolved Problems Electronic Signature(s) Signed: 03/11/2020 11:00:11 AM By: Worthy Keeler PA-C Entered By: Worthy Keeler on 03/11/2020 11:00:11 Michael Chang ([id number]) -------------------------------------------------------------------------------- Progress Note Details Patient Name: Michael Chang Date of Service: 03/11/2020 11:00 AM Medical Record Number: [id number] Patient Account Number: [id number] Date of Birth/Sex: Sep 19, 1946 (73 y.o. M) Treating RN: Montey Hora Primary Care Provider: Alma Friendly Other Clinician: Referring Provider: Alma Friendly Treating Provider/Extender: Melburn Hake, Sheneika Walstad Weeks in Treatment: 9 Subjective Chief Complaint Information obtained from Patient Right foot and ankle ulcers History of Present Illness (HPI) 01/07/2020 patient presents today for initial evaluation in our clinic concerning issues that he has been having with ulcers on his bilateral feet. More specifically at his left foot and his right ankle and on the ankle it is medial and lateral. With that being said these wounds have apparently been present for a significant amount of time in fact the patient states "20 years". When I questioned this and even ask his family member that was with him today she states that the last time she saw them was around 2006 and they were not this bad. Nonetheless I think he has had this for a significant amount of time although I am unsure of the exact locations of wounds and if some have come and gone. He does have chronic venous insufficiency, lymphedema, hypertension, and has had a  heart attack in the recent past few months. Subsequently he just moved from [place] to this area to live with family. His hypertension has been very uncontrolled he does see Dr. [name] and was  placed on amlodipine 5 mg. With that being said unfortunately this does not seem according to family to have helped much with his blood pressure currently though to be honest it somewhat hard to know for sure as he had not taken the medicine before he came in today his blood pressure was significantly elevated but apparently it was when he saw his primary on Tuesday as well but then by the time he took his medicine got to the cardiologist this was much lower dropping from around 371 systolic to around 696. Nonetheless my suggestion in this regard is can be for her to keep a track of his blood pressure readings morning noon and night in order to see how this goes throughout the day for the next week and then we will address it following. In regard to the wounds we are going to see what we can do as far as helping with this currently. He also goes to vascular for testing later today. 01/14/2020 upon evaluation today patient's wound actually appears to be doing quite well in general at all locations. He has a lot of new epithelial growth which is great news. Fortunately there is no signs of active infection at this time. He did have his arterial study on the right this was a ABI of 1.44 with a TBI of 1.10 on the left an ABI of 1.46 with a TBI of 0.93. The good news is he has no signs of arterial insufficiency. The patient also did have blood pressure readings that his family member took over the past week morning and evening. Consistently he was much lower in the evening than he was in the morning. They feel like that his amlodipine probably needs to be increased again I explained that I would give there cardiologist, Dr. [name], a call but that that is not something that I would manage here at the wound care center  as far as his blood pressure is concerned. Be more than happy to relay that information to Dr. Tyrell Antonio office however. 01/29/2020 upon evaluation today patient appears to be doing well in general with regard to his wounds. With that being said on the medial aspect he does have some purulent drainage noted which is somewhat reminiscent of potential Pseudomonas although there could be something else going on here as well. Nonetheless I think we do need to obtain a culture also think that antibiotics are probably can be necessary at this point. 02/05/2020 upon evaluation today patient appears to be doing excellent in regard to his wounds. In fact 2 wounds are completely healed as of today just the remaining right medial ankle ulcer is still open but appears to be doing much better there is no evidence of infection today he has been on the antibiotic, Levaquin, that I prescribed for him last week which was appropriate for the infection which showed positive for Morganella and Staphylococcus. 5/21. Still with an extensive area on the right medial ankle. We have been using silver collagen under Tubigrip 02/26/2020 upon evaluation today patient appears to be doing well in regard to his wounds at this point. In fact he just has 1 remaining currently and this actually showed signs of good epithelial growth. There does not appear to be any evidence of active infection at this time which is excellent news overall the patient is very pleased with how things seem to be progressing. He is having no pain at this point. 03/03/2020 upon evaluation today patient actually appears to be doing excellent  in regard to his leg ulcer. This is showing signs of great improvement and overall very pleased with how things seem to be progressing. Overall he continues to make progress week by week. 03/11/2020 upon evaluation today patient appears to be doing well at this time in regard to his ulcer. He has been tolerating the dressing  changes without complication. Fortunately there is no evidence of infection and overall feel like he is progressing quite nicely. Objective Constitutional Well-nourished and well-hydrated in no acute distress. Vitals Time Taken: 11:10 AM, Height: 71 in, Weight: 138 lbs, BMI: 19.2, Temperature: 98.3 F, Pulse: 67 bpm, Respiratory Rate: 16 breaths/min, Blood Pressure: 152/79 mmHg. MADDEN, GARRON ([id number]) Respiratory normal breathing without difficulty. Psychiatric this patient is able to make decisions and demonstrates good insight into disease process. Alert and Oriented x 3. pleasant and cooperative. General Notes: Patient's wound bed currently showed signs of excellent granulation at this time in good epithelial growth there is no evidence of significant slough buildup that I could not debride away mechanically with saline gauze he tolerated that without complication. Integumentary (Hair, Skin) Wound #2 status is Open. Original cause of wound was Gradually Appeared. The wound is located on the Right,Medial Ankle. The wound measures 2.8cm length x 2.2cm width x 0.2cm depth; 4.838cm^2 area and 0.968cm^3 volume. There is Fat Layer (Subcutaneous Tissue) Exposed exposed. There is a medium amount of serous drainage noted. The wound margin is thickened. There is large (67-100%) pink granulation within the wound bed. There is a small (1-33%) amount of necrotic tissue within the wound bed including Adherent Slough. 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 Cleanse wound with mild soap  and water Primary Wound Dressing: Wound #2 Right,Medial Ankle: Hydrafera Blue Ready Transfer Secondary Dressing: Wound #2 Right,Medial Ankle: ABD and Kerlix/Conform Dressing Change Frequency: Wound #2 Right,Medial Ankle: Change dressing every other day. Follow-up Appointments: Return Appointment in 1 week. Edema Control: Wound #2 Right,Medial Ankle: Other: - tubi grip E bilateral 1. I would recommend currently that we go ahead and initiate treatment with a continuation of the Hydrofera Blue dressing. The patient seems to be doing well in that regard. 2. I am also can recommend at this time that we have the patient continue to monitor for any signs of infection though I do not see any evidence of trouble in this regard at this point. 3. We will continue with the Tubigrip for edema control I think that is also beneficial for the patient. We will see patient back for reevaluation in 1 week here in the clinic. If anything worsens or changes patient will contact our office for additional recommendations. Electronic Signature(s) Signed: 03/11/2020 12:53:44 PM By: Worthy Keeler PA-C Entered By: Worthy Keeler on 03/11/2020 12:53:44 Tallman, Chrissie Noa ([id number]) ABDURAHMAN, RUGG ([id number]) -------------------------------------------------------------------------------- SuperBill Details Patient Name: Michael Chang Date of Service: 03/11/2020 Medical Record Number: [id number] Patient Account Number: [id number] Date of Birth/Sex: 1945-10-23 (73 y.o. M) Treating RN: Montey Hora Primary Care Provider: Alma Friendly Other Clinician: Referring Provider: Alma Friendly Treating Provider/Extender: Melburn Hake, Viktorya Arguijo Weeks in Treatment: 9 Diagnosis Coding ICD-10 Codes Code Description I87.2 Venous insufficiency (chronic) (peripheral) I89.0 Lymphedema, not elsewhere classified L97.522 Non-pressure chronic ulcer of  other part of left foot with fat layer exposed L97.312 Non-pressure  chronic ulcer of right ankle with fat layer exposed I10 Essential (primary) hypertension I25.10 Atherosclerotic heart disease of native coronary artery without angina pectoris Facility Procedures CPT4 Code: 32256720 Description: 99213 - WOUND CARE VISIT-LEV 3 EST PT Modifier: Quantity: 1 Physician Procedures CPT4 Code: 9198022 Description: 17981 - WC PHYS LEVEL 3 - EST PT Modifier: Quantity: 1 CPT4 Code: Description: ICD-10 Diagnosis Description I87.2 Venous insufficiency (chronic) (peripheral) I89.0 Lymphedema, not elsewhere classified L97.522 Non-pressure chronic ulcer of other part of left foot with fat layer ex L97.312 Non-pressure chronic ulcer of  right ankle with fat layer exposed Modifier: posed Quantity: Electronic Signature(s) Signed: 03/11/2020 12:53:55 PM By: Worthy Keeler PA-C Entered By: Worthy Keeler on 03/11/2020 12:53:55

## 2020-03-14 DIAGNOSIS — R55 Syncope and collapse: Secondary | ICD-10-CM

## 2020-03-14 DIAGNOSIS — I130 Hypertensive heart and chronic kidney disease with heart failure and stage 1 through stage 4 chronic kidney disease, or unspecified chronic kidney disease: Secondary | ICD-10-CM | POA: Diagnosis not present

## 2020-03-14 DIAGNOSIS — H269 Unspecified cataract: Secondary | ICD-10-CM

## 2020-03-14 DIAGNOSIS — H2511 Age-related nuclear cataract, right eye: Secondary | ICD-10-CM | POA: Diagnosis not present

## 2020-03-14 DIAGNOSIS — I252 Old myocardial infarction: Secondary | ICD-10-CM | POA: Diagnosis not present

## 2020-03-14 DIAGNOSIS — M545 Low back pain: Secondary | ICD-10-CM

## 2020-03-14 DIAGNOSIS — N183 Chronic kidney disease, stage 3 unspecified: Secondary | ICD-10-CM | POA: Diagnosis not present

## 2020-03-14 DIAGNOSIS — S0181XD Laceration without foreign body of other part of head, subsequent encounter: Secondary | ICD-10-CM

## 2020-03-14 DIAGNOSIS — Z9181 History of falling: Secondary | ICD-10-CM

## 2020-03-14 DIAGNOSIS — H2589 Other age-related cataract: Secondary | ICD-10-CM | POA: Diagnosis not present

## 2020-03-14 DIAGNOSIS — I2510 Atherosclerotic heart disease of native coronary artery without angina pectoris: Secondary | ICD-10-CM | POA: Diagnosis not present

## 2020-03-14 DIAGNOSIS — D509 Iron deficiency anemia, unspecified: Secondary | ICD-10-CM

## 2020-03-14 DIAGNOSIS — I5022 Chronic systolic (congestive) heart failure: Secondary | ICD-10-CM | POA: Diagnosis not present

## 2020-03-14 DIAGNOSIS — I739 Peripheral vascular disease, unspecified: Secondary | ICD-10-CM

## 2020-03-14 DIAGNOSIS — W19XXXD Unspecified fall, subsequent encounter: Secondary | ICD-10-CM

## 2020-03-14 DIAGNOSIS — L97319 Non-pressure chronic ulcer of right ankle with unspecified severity: Secondary | ICD-10-CM

## 2020-03-15 ENCOUNTER — Encounter (INDEPENDENT_AMBULATORY_CARE_PROVIDER_SITE_OTHER): Payer: Self-pay | Admitting: Ophthalmology

## 2020-03-15 ENCOUNTER — Other Ambulatory Visit: Payer: Self-pay

## 2020-03-15 ENCOUNTER — Ambulatory Visit (INDEPENDENT_AMBULATORY_CARE_PROVIDER_SITE_OTHER): Payer: Medicare Other | Admitting: Ophthalmology

## 2020-03-15 ENCOUNTER — Encounter (INDEPENDENT_AMBULATORY_CARE_PROVIDER_SITE_OTHER): Payer: Medicare Other | Admitting: Ophthalmology

## 2020-03-15 DIAGNOSIS — H18231 Secondary corneal edema, right eye: Secondary | ICD-10-CM

## 2020-03-15 DIAGNOSIS — H2701 Aphakia, right eye: Secondary | ICD-10-CM

## 2020-03-15 DIAGNOSIS — H43811 Vitreous degeneration, right eye: Secondary | ICD-10-CM

## 2020-03-15 DIAGNOSIS — H35372 Puckering of macula, left eye: Secondary | ICD-10-CM | POA: Diagnosis not present

## 2020-03-15 DIAGNOSIS — H59021 Cataract (lens) fragments in eye following cataract surgery, right eye: Secondary | ICD-10-CM | POA: Diagnosis not present

## 2020-03-15 HISTORY — DX: Cataract (lens) fragments in eye following cataract surgery, right eye: H59.021

## 2020-03-15 NOTE — [place] Notes (Signed)
03/15/2020     CHIEF COMPLAINT Patient presents for Retina Evaluation   HISTORY OF PRESENT ILLNESS: Michael Chang is a 74 y.o. male who presents to the clinic today for:   HPI    Retina Evaluation    In right eye.  Duration of 1 day.  Associated Symptoms Pain and Floaters.  Context:  distance vision.  Treatments tried include no treatments.          Comments    Referred for retained lens fragments w/ posterior capsule intact -  Patient had cataract surgery yesterday OD. Patient states that he has some pain and also sees floaters.  Verbal report from Dr. Talbert Forest indicates the patient is highly myopic and would have required a PCIOL of roughly +5.0 diopters which was not in possession by the surgical center.  With the anticipated surgical need for removal of retained lens fragment, Appropriate lens can be ordered in possession as a three-piece IOL,         Last edited by Hurman Horn, MD on 03/15/2020  3:17 PM. (History)      Referring physician: [name], NP [place],  [place] [address]  HISTORICAL INFORMATION:   Selected notes from the [facility]: No current outpatient medications on file. (Ophthalmic Drugs)   No current facility-administered medications for this visit. (Ophthalmic Drugs)   Current Outpatient Medications (Other)  Medication Sig  . amLODipine (NORVASC) 10 MG tablet Take 10 mg by mouth daily.  [name] aspirin 81 MG EC tablet Take by mouth.  . carvedilol (COREG) 12.5 MG tablet Take 1 tablet (12.5 mg total) by mouth 2 (two) times daily with a meal.  . hydrALAZINE (APRESOLINE) 100 MG tablet Take 1 tablet (100 mg total) by mouth 2 (two) times daily.  . isosorbide mononitrate (IMDUR) 30 MG 24 hr tablet Take 2 tablets by mouth in the AM, and take 1/2 tablet by mouth in the PM.  . isosorbide mononitrate (IMDUR) 60 MG 24 hr tablet Take 1 tablet (60 mg total) by mouth daily. For blood pressure.  . Multiple  Vitamin (MULTIVITAMIN WITH MINERALS) TABS tablet Take 1 tablet by mouth daily.  . Multiple Vitamins-Minerals (CENTRUM SILVER 50+MEN) TABS Take by mouth.   No current facility-administered medications for this visit. (Other)      REVIEW OF SYSTEMS:    ALLERGIES No Known Allergies  PAST MEDICAL HISTORY Past Medical History:  Diagnosis Date  . Anemia   . CAD (coronary artery disease)   . Chronic combined systolic (congestive) and diastolic (congestive) heart failure ([place])   . Chronic ulcer of right lower extremity ([place])   . CKD (chronic kidney disease), stage III   . Essential hypertension   . HLD (hyperlipidemia)   . NSTEMI (non-ST elevated myocardial infarction) ([place])   . PVD (peripheral vascular disease) ([place])    History reviewed. No pertinent surgical history.  FAMILY HISTORY History reviewed. No pertinent family history.  SOCIAL HISTORY Social History   Tobacco Use  . Smoking status: Never Smoker  . Smokeless tobacco: Never Used  Substance Use Topics  . Alcohol use: Not Currently  . Drug use: Not on file         OPHTHALMIC EXAM:  Base Eye Exam    Visual Acuity (Snellen - Linear)      Right Left   Dist [place] HM CF @ 2'   Dist ph [place]  NI  Tonometry (Tonopen, 2:22 PM)      Right Left   Pressure 15 15       Pupils      Pupils Dark Light Shape React APD   Right PERRL        Left PERRL 3 2 Round Minimal None  Unable to view OD        Visual Fields (Counting fingers)      Left Right   Restrictions Partial outer superior temporal, inferior temporal, superior nasal, inferior nasal deficiencies Total superior temporal, inferior temporal, superior nasal, inferior nasal deficiencies       Extraocular Movement      Right Left    Full Full       Neuro/Psych    Oriented x3: Yes   Mood/Affect: Normal       Dilation    Both eyes: 1.0% Mydriacyl, 2.5% Phenylephrine @ 2:22 PM        Slit Lamp and Fundus Exam    External Exam      Right Left     External Normal Normal       Slit Lamp Exam      Right Left   Lids/Lashes Normal Normal   Conjunctiva/Sclera White and quiet White and quiet   Cornea 2+ Edema, 2+ Striae Clear   Anterior Chamber All retained lens fragments Deep and quiet   Iris Round and reactive Round and reactive   Lens Aphakia, no details of capsule    Anterior Vitreous Normal Normal       Fundus Exam      Right Left   Posterior Vitreous  Posterior vitreous detachment   Disc No details posteriorly Tilted cup, Peripapillary atrophy   C/D Ratio  0.6   Macula  Attached with poor details   Vessels  Normal   Periphery Red reflex Attached peripherally          IMAGING AND PROCEDURES  Imaging and Procedures for 03/15/20  OCT, Retina - OU - Both Eyes       Right Eye Quality was good. Scan locations included subfoveal.   Left Eye Quality was good. Scan locations included subfoveal.        B-Scan Ultrasound - OD - Right Eye       Quality was good. Findings included vitreous opacities.   Notes Retained cataract lens fragments seen inferiorly and diffusely.  Myopic contour with possible posterior staphyloma of the sclera.  No retinal detachment                ASSESSMENT/PLAN:  Cataract (lens) fragments in eye following cataract surgery, right eye The nature of retained lens fragment in the vitreous was discussed with the patient. I discussed the structure  that normally  holds that cataract lens in place is similar to a "baggy", and its weakness or disruption in the process of cataract surgery leads to dispersal of lens fragments into the vitreous (gelatin like) material behind the planned lens implant.   The need for vitrectomy with removal of lens fragments and possible placement of a secondary intraocular lens implant discussed.  Large cataract lens fragments may trigger inflammation which can impact the internal structures of the eye and vision.  Some instances require repositioning of  initial intraocular lens implant, or possibly the removal or exchange of the initial lens implant, and replacement with a different style of lens implant that is suitable for the new eye condition.  The occurrence of lens fragments retained after cataract surgery does  have attendant risk of retinal tears and retinal detachments. These issues may be addressed in the office or operating room upon discovery during the initial surgery or recovery period.  OD, will need vitrectomy with removal of retained lens fragments, secondary insertion of 3 piece intraocular lens in the sulcus posterior chamber      ICD-10-CM   1. Cataract (lens) fragments in eye following cataract surgery, right eye  H59.021 B-Scan Ultrasound - OD - Right Eye  2. Secondary corneal edema, right  H18.231 B-Scan Ultrasound - OD - Right Eye  3. Aphakia of eye, right  H27.01     1.  Risk benefits will be reviewed with the patient and the family knowing that lens fragments are present and that vitrectomy will be required. High myopia which means a long eye in, poses that the patient has a higher than normal risk for retinal detachment over his lifetime.  Having a lens fragment release like this at time of cataract surgery increases the risk including the need for the surgery that to fix and repair this condition called a vitrectomy and insertion of a posterior chamber intraocular lens  2.  [place] to look for ordering a three-piece IOL with appropriate strength so we may proceed as soon as possible with vitrectomy removal of retained lens fragments and insert PCIOL OD  3.  Continue on current topical therapy as prescribed by Dr. Lelon Huh once daily OD, Besivance 3 times daily OD, Durezol 3 times daily OD Ophthalmic Meds Ordered this visit:  No orders of the defined types were placed in this encounter.      Return At [facility] surgical center, local MAC anesthesia, for ,, Will schedule vitrectomy, removal lens  fragments, insert PCIOL OD.  Patient Instructions  Will remain n.p.o. after midnight night before surgery.    Explained the diagnoses, plan, and follow up with the patient and they expressed understanding.  Patient expressed understanding of the importance of proper follow up care.   Clent Demark Dianelly Ferran M.D. Diseases & Surgery of the Retina and Vitreous Retina & Diabetic [place] 03/15/20     Abbreviations: M myopia (nearsighted); A astigmatism; H hyperopia (farsighted); P presbyopia; Mrx spectacle prescription;  CTL contact lenses; OD right eye; OS left eye; OU both eyes  XT exotropia; ET esotropia; PEK punctate epithelial keratitis; PEE punctate epithelial erosions; DES dry eye syndrome; MGD meibomian gland dysfunction; ATs artificial tears; PFAT's preservative free artificial tears; [place] nuclear sclerotic cataract; PSC posterior subcapsular cataract; ERM epi-retinal membrane; PVD posterior vitreous detachment; RD retinal detachment; DM diabetes mellitus; DR diabetic retinopathy; NPDR non-proliferative diabetic retinopathy; PDR proliferative diabetic retinopathy; CSME clinically significant macular edema; DME diabetic macular edema; dbh dot blot hemorrhages; CWS cotton wool spot; POAG primary open angle glaucoma; C/D cup-to-disc ratio; HVF humphrey visual field; GVF goldmann visual field; OCT optical coherence tomography; IOP intraocular pressure; BRVO Branch retinal vein occlusion; CRVO central retinal vein occlusion; CRAO central retinal artery occlusion; BRAO branch retinal artery occlusion; RT retinal tear; SB scleral buckle; PPV pars plana vitrectomy; VH Vitreous hemorrhage; PRP panretinal laser photocoagulation; IVK intravitreal kenalog; VMT vitreomacular traction; MH Macular hole;  NVD neovascularization of the disc; NVE neovascularization elsewhere; AREDS age related eye disease study; ARMD age related macular degeneration; POAG primary open angle glaucoma; EBMD epithelial/anterior basement  membrane dystrophy; ACIOL anterior chamber intraocular lens; IOL intraocular lens; PCIOL posterior chamber intraocular lens; Phaco/IOL phacoemulsification with intraocular lens placement; [place] photorefractive keratectomy; LASIK laser assisted in situ keratomileusis;  HTN hypertension; DM diabetes mellitus; COPD chronic obstructive pulmonary disease

## 2020-03-15 NOTE — Patient Instructions (Signed)
Will remain n.p.o. after midnight night before surgery.

## 2020-03-15 NOTE — Assessment & Plan Note (Signed)
The nature of retained lens fragment in the vitreous was discussed with the patient. I discussed the structure  that normally  holds that cataract lens in place is similar to a "baggy", and its weakness or disruption in the process of cataract surgery leads to dispersal of lens fragments into the vitreous (gelatin like) material behind the planned lens implant.   The need for vitrectomy with removal of lens fragments and possible placement of a secondary intraocular lens implant discussed.  Large cataract lens fragments may trigger inflammation which can impact the internal structures of the eye and vision.  Some instances require repositioning of initial intraocular lens implant, or possibly the removal or exchange of the initial lens implant, and replacement with a different style of lens implant that is suitable for the new eye condition.  The occurrence of lens fragments retained after cataract surgery does have attendant risk of retinal tears and retinal detachments. These issues may be addressed in the office or operating room upon discovery during the initial surgery or recovery period.  OD, will need vitrectomy with removal of retained lens fragments, secondary insertion of 3 piece intraocular lens in the sulcus posterior chamber

## 2020-03-16 DIAGNOSIS — Z9181 History of falling: Secondary | ICD-10-CM | POA: Diagnosis not present

## 2020-03-16 DIAGNOSIS — R2689 Other abnormalities of gait and mobility: Secondary | ICD-10-CM | POA: Diagnosis not present

## 2020-03-16 DIAGNOSIS — M6281 Muscle weakness (generalized): Secondary | ICD-10-CM | POA: Diagnosis not present

## 2020-03-16 DIAGNOSIS — R531 Weakness: Secondary | ICD-10-CM | POA: Diagnosis not present

## 2020-03-16 DIAGNOSIS — R2681 Unsteadiness on feet: Secondary | ICD-10-CM | POA: Diagnosis not present

## 2020-03-18 ENCOUNTER — Ambulatory Visit: Payer: Medicare Other | Admitting: Physician Assistant

## 2020-03-18 ENCOUNTER — Encounter (AMBULATORY_SURGERY_CENTER): Payer: Medicare Other | Admitting: Ophthalmology

## 2020-03-18 DIAGNOSIS — H59021 Cataract (lens) fragments in eye following cataract surgery, right eye: Secondary | ICD-10-CM | POA: Diagnosis not present

## 2020-03-18 DIAGNOSIS — H2701 Aphakia, right eye: Secondary | ICD-10-CM

## 2020-03-18 DIAGNOSIS — H33321 Round hole, right eye: Secondary | ICD-10-CM

## 2020-03-18 DIAGNOSIS — H33331 Multiple defects of retina without detachment, right eye: Secondary | ICD-10-CM | POA: Diagnosis not present

## 2020-03-18 DIAGNOSIS — H4421 Degenerative myopia, right eye: Secondary | ICD-10-CM | POA: Diagnosis not present

## 2020-03-19 ENCOUNTER — Ambulatory Visit (INDEPENDENT_AMBULATORY_CARE_PROVIDER_SITE_OTHER): Payer: Medicare Other | Admitting: Ophthalmology

## 2020-03-19 ENCOUNTER — Encounter (INDEPENDENT_AMBULATORY_CARE_PROVIDER_SITE_OTHER): Payer: Self-pay | Admitting: Ophthalmology

## 2020-03-19 DIAGNOSIS — Z09 Encounter for follow-up examination after completed treatment for conditions other than malignant neoplasm: Secondary | ICD-10-CM

## 2020-03-19 DIAGNOSIS — H59021 Cataract (lens) fragments in eye following cataract surgery, right eye: Secondary | ICD-10-CM

## 2020-03-19 DIAGNOSIS — H2701 Aphakia, right eye: Secondary | ICD-10-CM

## 2020-03-19 DIAGNOSIS — H33321 Round hole, right eye: Secondary | ICD-10-CM | POA: Insufficient documentation

## 2020-03-19 NOTE — [place] Notes (Signed)
03/19/2020     CHIEF COMPLAINT Patient presents for Post-op Follow-up   HISTORY OF PRESENT ILLNESS: Michael Chang is a 74 y.o. male who presents to the clinic today for:   HPI    Post-op Follow-up    In right eye.  Discomfort includes foreign body sensation and none.  Vision is improved.  I, the attending physician,  performed the HPI with the patient and updated documentation appropriately.          Comments    Postop day #1 status post vitrectomy, focal laser photocoagulation for retinal holes and retained lens fragment, secondary insertion of PCIOL in the sulcus right eye       Last edited by Michael Horn, MD on 03/19/2020  7:52 AM. (History)      Referring physician: No referring provider defined for this encounter.  HISTORICAL INFORMATION:   Selected notes from the MEDICAL RECORD NUMBER       CURRENT MEDICATIONS: No current outpatient medications on file. (Ophthalmic Drugs)   No current facility-administered medications for this visit. (Ophthalmic Drugs)   Current Outpatient Medications (Other)  Medication Sig  . amLODipine (NORVASC) 10 MG tablet Take 10 mg by mouth daily.  [name] aspirin 81 MG EC tablet Take by mouth.  . carvedilol (COREG) 12.5 MG tablet Take 1 tablet (12.5 mg total) by mouth 2 (two) times daily with a meal.  . hydrALAZINE (APRESOLINE) 100 MG tablet Take 1 tablet (100 mg total) by mouth 2 (two) times daily.  . isosorbide mononitrate (IMDUR) 30 MG 24 hr tablet Take 2 tablets by mouth in the AM, and take 1/2 tablet by mouth in the PM.  . isosorbide mononitrate (IMDUR) 60 MG 24 hr tablet Take 1 tablet (60 mg total) by mouth daily. For blood pressure.  . Multiple Vitamin (MULTIVITAMIN WITH MINERALS) TABS tablet Take 1 tablet by mouth daily.  . Multiple Vitamins-Minerals (CENTRUM SILVER 50+MEN) TABS Take by mouth.   No current facility-administered medications for this visit. (Other)      REVIEW OF SYSTEMS: ROS    Negative for: Constitutional,  Gastrointestinal, Neurological, Skin, Genitourinary, Musculoskeletal, HENT, Endocrine, Cardiovascular, Eyes, Respiratory, Psychiatric, Allergic/Imm, Heme/Lymph   Last edited by Michael Horn, MD on 03/19/2020  7:51 AM. (History)       ALLERGIES No Known Allergies  PAST MEDICAL HISTORY Past Medical History:  Diagnosis Date  . Anemia   . CAD (coronary artery disease)   . Chronic combined systolic (congestive) and diastolic (congestive) heart failure ([place])   . Chronic ulcer of right lower extremity ([place])   . CKD (chronic kidney disease), stage III   . Essential hypertension   . HLD (hyperlipidemia)   . NSTEMI (non-ST elevated myocardial infarction) ([place])   . PVD (peripheral vascular disease) ([place])    No past surgical history on file.  FAMILY HISTORY No family history on file.  SOCIAL HISTORY Social History   Tobacco Use  . Smoking status: Never Smoker  . Smokeless tobacco: Never Used  Substance Use Topics  . Alcohol use: Not Currently  . Drug use: Not on file         OPHTHALMIC EXAM:  Slit Lamp and Fundus Exam    External Exam      Right Left   External Normal        Slit Lamp Exam      Right Left   Lids/Lashes Mild lid edema    Conjunctiva/Sclera 1+ Injection    Cornea 1+ Descemet's folds,  central 2 mm epithelial abrasion, from surgical maneuver    Anterior Chamber 1+ Cell    Iris Round and reactive    Lens Centered posterior chamber intraocular lens    Anterior Vitreous Vitrectomized, no fragments seen        Fundus Exam      Right Left   Posterior Vitreous Vitrectomized no fragments seen, retina attached, no masses or elevations    Disc PERI papillary atrophy    C/D Ratio 0.35    Macula Normal    Vessels Normal    Periphery Good retinopexy inferiorly at 5 and 6:00 meridians to retinal holes           IMAGING AND PROCEDURES  Imaging and Procedures for 03/19/20           ASSESSMENT/PLAN:  Aphakia of eye, right Improved status post  vitrectomy and secondary insertion of intraocular lens yesterday  Cataract (lens) fragments in eye following cataract surgery, right eye No large fragments detected postoperative day #1 vitrectomy      ICD-10-CM   1. Aphakia of eye, right  H27.01   2. Cataract (lens) fragments in eye following cataract surgery, right eye  H59.021   3. Retinal holes, right  H33.321     1. Medication use Resume Besivance right eye 3 or 4 times daily  Resume Durezol 3 times daily to the right eye  Resume PROLENSA nightly to the right eye  2.  3.  Ophthalmic Meds Ordered this visit:  No orders of the defined types were placed in this encounter.      Return in about 5 days (around 03/24/2020) for OD, POST OP, dilate, COLOR FP.  Patient Instructions  Patient and family to call for follow-up visit in 4 to 6 days Tuesday or Thursday afternoon. Patient or family will call on Monday, June 21 make appointment for June 22 or June 24    Explained the diagnoses, plan, and follow up with the patient and they expressed understanding.  Patient expressed understanding of the importance of proper follow up care.   Michael Chang M.D. Diseases & Surgery of the Retina and Vitreous Retina & Diabetic [place] 03/19/20     Abbreviations: M myopia (nearsighted); A astigmatism; H hyperopia (farsighted); P presbyopia; Mrx spectacle prescription;  CTL contact lenses; OD right eye; OS left eye; OU both eyes  XT exotropia; ET esotropia; PEK punctate epithelial keratitis; PEE punctate epithelial erosions; DES dry eye syndrome; MGD meibomian gland dysfunction; ATs artificial tears; PFAT's preservative free artificial tears; [place] nuclear sclerotic cataract; PSC posterior subcapsular cataract; ERM epi-retinal membrane; PVD posterior vitreous detachment; RD retinal detachment; DM diabetes mellitus; DR diabetic retinopathy; NPDR non-proliferative diabetic retinopathy; PDR proliferative diabetic retinopathy; CSME  clinically significant macular edema; DME diabetic macular edema; dbh dot blot hemorrhages; CWS cotton wool spot; POAG primary open angle glaucoma; C/D cup-to-disc ratio; HVF humphrey visual field; GVF goldmann visual field; OCT optical coherence tomography; IOP intraocular pressure; BRVO Branch retinal vein occlusion; CRVO central retinal vein occlusion; CRAO central retinal artery occlusion; BRAO branch retinal artery occlusion; RT retinal tear; SB scleral buckle; PPV pars plana vitrectomy; VH Vitreous hemorrhage; PRP panretinal laser photocoagulation; IVK intravitreal kenalog; VMT vitreomacular traction; MH Macular hole;  NVD neovascularization of the disc; NVE neovascularization elsewhere; AREDS age related eye disease study; ARMD age related macular degeneration; POAG primary open angle glaucoma; EBMD epithelial/anterior basement membrane dystrophy; ACIOL anterior chamber intraocular lens; IOL intraocular lens; PCIOL posterior chamber intraocular lens; Phaco/IOL phacoemulsification with intraocular  lens placement; [place] photorefractive keratectomy; LASIK laser assisted in situ keratomileusis; HTN hypertension; DM diabetes mellitus; COPD chronic obstructive pulmonary disease

## 2020-03-19 NOTE — Assessment & Plan Note (Signed)
No large fragments detected postoperative day #1 vitrectomy

## 2020-03-19 NOTE — Assessment & Plan Note (Signed)
Improved status post vitrectomy and secondary insertion of intraocular lens yesterday

## 2020-03-19 NOTE — Patient Instructions (Signed)
Patient and family to call for follow-up visit in 4 to 6 days Tuesday or Thursday afternoon. Patient or family will call on Monday, June 21 make appointment for June 22 or June 24

## 2020-03-21 ENCOUNTER — Other Ambulatory Visit: Payer: Self-pay | Admitting: Primary Care

## 2020-03-21 DIAGNOSIS — Z1159 Encounter for screening for other viral diseases: Secondary | ICD-10-CM

## 2020-03-22 ENCOUNTER — Ambulatory Visit (INDEPENDENT_AMBULATORY_CARE_PROVIDER_SITE_OTHER): Payer: Medicare Other | Admitting: Ophthalmology

## 2020-03-22 ENCOUNTER — Other Ambulatory Visit: Payer: Self-pay

## 2020-03-22 DIAGNOSIS — H59021 Cataract (lens) fragments in eye following cataract surgery, right eye: Secondary | ICD-10-CM

## 2020-03-22 DIAGNOSIS — H18231 Secondary corneal edema, right eye: Secondary | ICD-10-CM

## 2020-03-22 DIAGNOSIS — Z09 Encounter for follow-up examination after completed treatment for conditions other than malignant neoplasm: Secondary | ICD-10-CM | POA: Insufficient documentation

## 2020-03-22 DIAGNOSIS — H2701 Aphakia, right eye: Secondary | ICD-10-CM

## 2020-03-22 DIAGNOSIS — H33321 Round hole, right eye: Secondary | ICD-10-CM

## 2020-03-22 NOTE — Progress Notes (Signed)
03/22/2020     CHIEF COMPLAINT Patient presents for Post-op Follow-up   HISTORY OF PRESENT ILLNESS: Michael Chang is a 74 y.o. male who presents to the clinic today for:   HPI    Post-op Follow-up    In right eye.  Discomfort includes pain.  Vision is stable.  I, the attending physician,  performed the HPI with the patient and updated documentation appropriately.          Comments    4 Day s\p vitrectomy and insertion of secondary lens OD. FP  Pt states vision is stable. Pt c/o slight OD pain       Last edited by [name] on 03/22/2020 12:49 PM. (History)      Referring physician: [name], NP [place],  [place] [address]  HISTORICAL INFORMATION:   Selected notes from the MEDICAL RECORD NUMBER       CURRENT MEDICATIONS: No current outpatient medications on file. (Ophthalmic Drugs)   No current facility-administered medications for this visit. (Ophthalmic Drugs)   Current Outpatient Medications (Other)  Medication Sig  . amLODipine (NORVASC) 10 MG tablet Take 10 mg by mouth daily.  [name] aspirin 81 MG EC tablet Take by mouth.  . carvedilol (COREG) 12.5 MG tablet Take 1 tablet (12.5 mg total) by mouth 2 (two) times daily with a meal.  . hydrALAZINE (APRESOLINE) 100 MG tablet Take 1 tablet (100 mg total) by mouth 2 (two) times daily.  . isosorbide mononitrate (IMDUR) 30 MG 24 hr tablet Take 2 tablets by mouth in the AM, and take 1/2 tablet by mouth in the PM.  . isosorbide mononitrate (IMDUR) 60 MG 24 hr tablet Take 1 tablet (60 mg total) by mouth daily. For blood pressure.  . Multiple Vitamin (MULTIVITAMIN WITH MINERALS) TABS tablet Take 1 tablet by mouth daily.  . Multiple Vitamins-Minerals (CENTRUM SILVER 50+MEN) TABS Take by mouth.   No current facility-administered medications for this visit. (Other)      REVIEW OF SYSTEMS:    ALLERGIES No Known Allergies  PAST MEDICAL HISTORY Past Medical History:  Diagnosis Date  .  Anemia   . CAD (coronary artery disease)   . Chronic combined systolic (congestive) and diastolic (congestive) heart failure ([place])   . Chronic ulcer of right lower extremity ([name])   . CKD (chronic kidney disease), stage III   . Essential hypertension   . HLD (hyperlipidemia)   . NSTEMI (non-ST elevated myocardial infarction) ([name])   . PVD (peripheral vascular disease) ([name])    No past surgical history on file.  FAMILY HISTORY No family history on file.  SOCIAL HISTORY Social History   Tobacco Use  . Smoking status: Never Smoker  . Smokeless tobacco: Never Used  Substance Use Topics  . Alcohol use: Not Currently  . Drug use: Not on file         OPHTHALMIC EXAM:  Base Eye Exam    Visual Acuity (Snellen - Linear)      Right Left   Dist [place] E Card @ 4' CF @ 2'   Dist ph [place] NI        Tonometry (Tonopen, 12:55 PM)      Right Left   Pressure 11 15       Pupils      Dark Light Shape React APD   Right 3 3 Round Minimal None   Left 3 3 Round Minimal None       Visual  Fields      Left Right   Restrictions Partial outer superior temporal, inferior temporal, superior nasal, inferior nasal deficiencies Partial outer superior temporal, inferior temporal, superior nasal, inferior nasal deficiencies       Neuro/Psych    Oriented x3: Yes   Mood/Affect: Normal       Dilation    Right eye: 1.0% Mydriacyl, 2.5% Phenylephrine @ 12:55 PM        Slit Lamp and Fundus Exam    External Exam      Right Left   External Normal        Slit Lamp Exam      Right Left   Lids/Lashes Mild lid edema    Conjunctiva/Sclera 1+ Injection    Cornea 1+ Descemet's folds, no epithelial breakdown, mild epithelial haze    Anterior Chamber trace Cell, the    Iris Round and reactive    Lens Centered posterior chamber intraocular lens    Anterior Vitreous Vitrectomized, no fragments seen        Fundus Exam      Right Left   Posterior Vitreous Vitrectomized no fragments seen, retina  attached, no masses or elevations    Disc PERI papillary atrophy    C/D Ratio 0.35    Macula Normal    Vessels Normal    Periphery Good retinopexy inferiorly at 5 and 6:00 meridians to retinal holes           IMAGING AND PROCEDURES  Imaging and Procedures for 03/22/20           ASSESSMENT/PLAN:  No problem-specific Assessment & Plan notes found for this encounter.      ICD-10-CM   1. Follow-up examination after eye surgery  Z09   2. Aphakia of eye, right  H27.01 Color Fundus Photography Optos - OU - Both Eyes  3. Cataract (lens) fragments in eye following cataract surgery, right eye  H59.021   4. Retinal holes, right  H33.321   5. Secondary corneal edema, right  H18.231     1.  Patient is to continue Durezol OD 3 times daily complete the current bottle of [place],, and use PROLENSA at bedtime OD.  2.  3.  Ophthalmic Meds Ordered this visit:  No orders of the defined types were placed in this encounter.      No follow-ups on file.  There are no Patient Instructions on file for this visit.   Explained the diagnoses, plan, and follow up with the patient and they expressed understanding.  Patient expressed understanding of the importance of proper follow up care.   Clent Demark Lexus [name] M.D. Diseases & Surgery of the Retina and Vitreous Retina & Diabetic [place] 03/22/20     Abbreviations: M myopia (nearsighted); A astigmatism; H hyperopia (farsighted); P presbyopia; Mrx spectacle prescription;  CTL contact lenses; OD right eye; OS left eye; OU both eyes  XT exotropia; ET esotropia; PEK punctate epithelial keratitis; PEE punctate epithelial erosions; DES dry eye syndrome; MGD meibomian gland dysfunction; ATs artificial tears; PFAT's preservative free artificial tears; [place] nuclear sclerotic cataract; PSC posterior subcapsular cataract; ERM epi-retinal membrane; PVD posterior vitreous detachment; RD retinal detachment; DM diabetes mellitus; DR diabetic  retinopathy; NPDR non-proliferative diabetic retinopathy; PDR proliferative diabetic retinopathy; CSME clinically significant macular edema; DME diabetic macular edema; dbh dot blot hemorrhages; CWS cotton wool spot; POAG primary open angle glaucoma; C/D cup-to-disc ratio; HVF humphrey visual field; GVF goldmann visual field; OCT optical coherence tomography; IOP intraocular pressure; BRVO Branch retinal  vein occlusion; CRVO central retinal vein occlusion; CRAO central retinal artery occlusion; BRAO branch retinal artery occlusion; RT retinal tear; SB scleral buckle; PPV pars plana vitrectomy; VH Vitreous hemorrhage; PRP panretinal laser photocoagulation; IVK intravitreal kenalog; VMT vitreomacular traction; MH Macular hole;  NVD neovascularization of the disc; NVE neovascularization elsewhere; AREDS age related eye disease study; ARMD age related macular degeneration; POAG primary open angle glaucoma; EBMD epithelial/anterior basement membrane dystrophy; ACIOL anterior chamber intraocular lens; IOL intraocular lens; PCIOL posterior chamber intraocular lens; Phaco/IOL phacoemulsification with intraocular lens placement; [place] photorefractive keratectomy; LASIK laser assisted in situ keratomileusis; HTN hypertension; DM diabetes mellitus; COPD chronic obstructive pulmonary disease

## 2020-03-23 DIAGNOSIS — D509 Iron deficiency anemia, unspecified: Secondary | ICD-10-CM | POA: Diagnosis not present

## 2020-03-23 DIAGNOSIS — I5022 Chronic systolic (congestive) heart failure: Secondary | ICD-10-CM | POA: Diagnosis not present

## 2020-03-23 DIAGNOSIS — H269 Unspecified cataract: Secondary | ICD-10-CM | POA: Diagnosis not present

## 2020-03-23 DIAGNOSIS — R55 Syncope and collapse: Secondary | ICD-10-CM | POA: Diagnosis not present

## 2020-03-23 DIAGNOSIS — I252 Old myocardial infarction: Secondary | ICD-10-CM | POA: Diagnosis not present

## 2020-03-23 DIAGNOSIS — I739 Peripheral vascular disease, unspecified: Secondary | ICD-10-CM | POA: Diagnosis not present

## 2020-03-23 DIAGNOSIS — N183 Chronic kidney disease, stage 3 unspecified: Secondary | ICD-10-CM | POA: Diagnosis not present

## 2020-03-23 DIAGNOSIS — I2510 Atherosclerotic heart disease of native coronary artery without angina pectoris: Secondary | ICD-10-CM | POA: Diagnosis not present

## 2020-03-23 DIAGNOSIS — M545 Low back pain: Secondary | ICD-10-CM | POA: Diagnosis not present

## 2020-03-23 DIAGNOSIS — I130 Hypertensive heart and chronic kidney disease with heart failure and stage 1 through stage 4 chronic kidney disease, or unspecified chronic kidney disease: Secondary | ICD-10-CM | POA: Diagnosis not present

## 2020-03-23 DIAGNOSIS — W19XXXD Unspecified fall, subsequent encounter: Secondary | ICD-10-CM | POA: Diagnosis not present

## 2020-03-23 DIAGNOSIS — S0181XD Laceration without foreign body of other part of head, subsequent encounter: Secondary | ICD-10-CM | POA: Diagnosis not present

## 2020-03-23 DIAGNOSIS — L97319 Non-pressure chronic ulcer of right ankle with unspecified severity: Secondary | ICD-10-CM | POA: Diagnosis not present

## 2020-03-25 ENCOUNTER — Encounter: Payer: Medicare Other | Admitting: Physician Assistant

## 2020-03-25 ENCOUNTER — Other Ambulatory Visit (INDEPENDENT_AMBULATORY_CARE_PROVIDER_SITE_OTHER): Payer: Medicare Other

## 2020-03-25 ENCOUNTER — Other Ambulatory Visit: Payer: Self-pay

## 2020-03-25 DIAGNOSIS — I10 Essential (primary) hypertension: Secondary | ICD-10-CM | POA: Diagnosis not present

## 2020-03-25 DIAGNOSIS — Z1159 Encounter for screening for other viral diseases: Secondary | ICD-10-CM | POA: Diagnosis not present

## 2020-03-25 DIAGNOSIS — L97312 Non-pressure chronic ulcer of right ankle with fat layer exposed: Secondary | ICD-10-CM | POA: Diagnosis not present

## 2020-03-25 DIAGNOSIS — I120 Hypertensive chronic kidney disease with stage 5 chronic kidney disease or end stage renal disease: Secondary | ICD-10-CM | POA: Diagnosis not present

## 2020-03-25 DIAGNOSIS — I2510 Atherosclerotic heart disease of native coronary artery without angina pectoris: Secondary | ICD-10-CM | POA: Diagnosis not present

## 2020-03-25 DIAGNOSIS — I252 Old myocardial infarction: Secondary | ICD-10-CM | POA: Diagnosis not present

## 2020-03-25 DIAGNOSIS — I872 Venous insufficiency (chronic) (peripheral): Secondary | ICD-10-CM | POA: Diagnosis not present

## 2020-03-25 DIAGNOSIS — L97522 Non-pressure chronic ulcer of other part of left foot with fat layer exposed: Secondary | ICD-10-CM | POA: Diagnosis not present

## 2020-03-25 DIAGNOSIS — I890 Lymphedema, not elsewhere classified: Secondary | ICD-10-CM | POA: Diagnosis not present

## 2020-03-25 DIAGNOSIS — N186 End stage renal disease: Secondary | ICD-10-CM | POA: Diagnosis not present

## 2020-03-25 NOTE — Progress Notes (Signed)
Michael Chang, Michael Chang ([id number]) Visit Report for 03/25/2020 Arrival Information Details Patient Name: Michael Chang, Michael Chang Date of Service: 03/25/2020 2:30 PM Medical Record Number: [id number] Patient Account Number: [id number] Date of Birth/Sex: November 08, 1945 (74 y.o. M) Treating RN: Army Melia Primary Care Leanne Sisler: Alma Friendly Other Clinician: Referring Madailein Londo: Alma Friendly Treating Sayler Mickiewicz/Extender: Melburn Hake, HOYT Weeks in Treatment: 11 Visit Information History Since Last Visit Added or deleted any medications: No Patient Arrived: Wheel Chair Any new allergies or adverse reactions: No Arrival Time: 14:29 Had a fall or experienced change in No Accompanied By: daughter activities of daily living that may affect Transfer Assistance: None risk of falls: Patient Identification Verified: Yes Signs or symptoms of abuse/neglect since last visito No Hospitalized since last visit: No Has Dressing in Place as Prescribed: Yes Pain Present Now: No Electronic Signature(s) Signed: 03/25/2020 3:00:39 PM By: Army Melia Entered By: Army Melia on 03/25/2020 14:30:00 Michael Chang ([id number]) -------------------------------------------------------------------------------- Clinic Level of Care Assessment Details Patient Name: Michael Chang Date of Service: 03/25/2020 2:30 PM Medical Record Number: [id number] Patient Account Number: [id number] Date of Birth/Sex: 09-Aug-1946 (74 y.o. M) Treating RN: Montey Hora Primary Care Devi Hopman: Alma Friendly Other Clinician: Referring Antonious Omahoney: Alma Friendly Treating Ajmal Kathan/Extender: Melburn Hake, HOYT Weeks in Treatment: 11 Clinic Level of Care Assessment Items TOOL 4 Quantity Score []  - Use when only an EandM is performed on FOLLOW-UP visit 0 ASSESSMENTS - Nursing Assessment / Reassessment X - Reassessment of Co-morbidities (includes updates in patient status) 1 10 X- 1 5 Reassessment of Adherence to Treatment Plan ASSESSMENTS - Wound  and Skin Assessment / Reassessment X - Simple Wound Assessment / Reassessment - one wound 1 5 []  - 0 Complex Wound Assessment / Reassessment - multiple wounds []  - 0 Dermatologic / Skin Assessment (not related to wound area) ASSESSMENTS - Focused Assessment []  - Circumferential Edema Measurements - multi extremities 0 []  - 0 Nutritional Assessment / Counseling / Intervention X- 1 5 Lower Extremity Assessment (monofilament, tuning fork, pulses) []  - 0 Peripheral Arterial Disease Assessment (using hand held doppler) ASSESSMENTS - Ostomy and/or Continence Assessment and Care []  - Incontinence Assessment and Management 0 []  - 0 Ostomy Care Assessment and Management (repouching, etc.) PROCESS - Coordination of Care X - Simple Patient / Family Education for ongoing care 1 15 []  - 0 Complex (extensive) Patient / Family Education for ongoing care X- 1 10 Staff obtains Programmer, systems, Records, Test Results / Process Orders []  - 0 Staff telephones HHA, Nursing Homes / Clarify orders / etc []  - 0 Routine Transfer to another Facility (non-emergent condition) []  - 0 Routine Hospital Admission (non-emergent condition) []  - 0 New Admissions / Biomedical engineer / Ordering NPWT, Apligraf, etc. []  - 0 Emergency Hospital Admission (emergent condition) X- 1 10 Simple Discharge Coordination []  - 0 Complex (extensive) Discharge Coordination PROCESS - Special Needs []  - Pediatric / Minor Patient Management 0 []  - 0 Isolation Patient Management []  - 0 Hearing / Language / Visual special needs []  - 0 Assessment of Community assistance (transportation, D/C planning, etc.) []  - 0 Additional assistance / Altered mentation []  - 0 Support Surface(s) Assessment (bed, cushion, seat, etc.) INTERVENTIONS - Wound Cleansing / Measurement Gailey, Michael Chang ([id number]) X- 1 5 Simple Wound Cleansing - one wound []  - 0 Complex Wound Cleansing - multiple wounds X- 1 5 Wound Imaging (photographs - any  number of wounds) []  - 0 Wound Tracing (instead of photographs) X- 1 5 Simple Wound Measurement - one wound []  - 0 Complex Wound Measurement -  multiple wounds INTERVENTIONS - Wound Dressings X - Small Wound Dressing one or multiple wounds 1 10 []  - 0 Medium Wound Dressing one or multiple wounds []  - 0 Large Wound Dressing one or multiple wounds []  - 0 Application of Medications - topical []  - 0 Application of Medications - injection INTERVENTIONS - Miscellaneous []  - External ear exam 0 []  - 0 Specimen Collection (cultures, biopsies, blood, body fluids, etc.) []  - 0 Specimen(s) / Culture(s) sent or taken to Lab for analysis []  - 0 Patient Transfer (multiple staff / Civil Service fast streamer / Similar devices) []  - 0 Simple Staple / Suture removal (25 or less) []  - 0 Complex Staple / Suture removal (26 or more) []  - 0 Hypo / Hyperglycemic Management (close monitor of Blood Glucose) []  - 0 Ankle / Brachial Index (ABI) - do not check if billed separately X- 1 5 Vital Signs Has the patient been seen at the hospital within the last three years: Yes Total Score: 90 Level Of Care: New/Established - Level 3 Electronic Signature(s) Signed: 03/25/2020 3:47:49 PM By: Montey Hora Entered By: Montey Hora on 03/25/2020 15:46:02 Michael Chang ([id number]) -------------------------------------------------------------------------------- Encounter Discharge Information Details Patient Name: Michael Chang Date of Service: 03/25/2020 2:30 PM Medical Record Number: [id number] Patient Account Number: [id number] Date of Birth/Sex: [birth year] (74 y.o. M) Treating RN: Montey Hora Primary Care Darvis Croft: Alma Friendly Other Clinician: Referring Magalene Mclear: Alma Friendly Treating Jerri Glauser/Extender: Melburn Hake, HOYT Weeks in Treatment: 11 Encounter Discharge Information Items Discharge Condition: Stable Ambulatory Status: Wheelchair Discharge Destination: Home Transportation: Private  Auto Accompanied By: sister Schedule Follow-up Appointment: Yes Clinical Summary of Care: Electronic Signature(s) Signed: 03/25/2020 3:47:11 PM By: Montey Hora Entered By: Montey Hora on 03/25/2020 15:47:11 Michael Chang ([id number]) -------------------------------------------------------------------------------- Lower Extremity Assessment Details Patient Name: Michael Chang Date of Service: 03/25/2020 2:30 PM Medical Record Number: [id number] Patient Account Number: [id number] Date of Birth/Sex: 1946-07-03 (73 y.o. M) Treating RN: Army Melia Primary Care Harris Penton: Alma Friendly Other Clinician: Referring Charina Fons: Alma Friendly Treating Laneah Luft/Extender: Melburn Hake, HOYT Weeks in Treatment: 11 Edema Assessment Assessed: [Left: No] [Right: No] Edema: [Left: N] [Right: o] Vascular Assessment Pulses: Dorsalis Pedis Palpable: [Right:Yes] Posterior Tibial Palpable: [Right:Yes] Electronic Signature(s) Signed: 03/25/2020 3:00:39 PM By: Army Melia Entered By: Army Melia on 03/25/2020 14:36:17 Michael Chang ([id number]) -------------------------------------------------------------------------------- Multi Wound Chart Details Patient Name: Michael Chang Date of Service: 03/25/2020 2:30 PM Medical Record Number: [id number] Patient Account Number: [id number] Date of Birth/Sex: Feb 01, 1946 (73 y.o. M) Treating RN: Montey Hora Primary Care Sundeep Cary: Alma Friendly Other Clinician: Referring Mirabella Hilario: Alma Friendly Treating Wetzel Meester/Extender: Melburn Hake, HOYT Weeks in Treatment: 11 Vital Signs Height(in): 71 Pulse(bpm): 80 Weight(lbs): 138 Blood Pressure(mmHg): 149/80 Body Mass Index(BMI): 19 Temperature(F): 98.4 Respiratory Rate(breaths/min): 16 Photos: [N/A:N/A] Wound Location: Right, Medial Ankle N/A N/A Wounding Event: Gradually Appeared N/A N/A Primary Etiology: Venous Leg Ulcer N/A N/A Comorbid History: Hypertension, Myocardial Infarction, N/A  N/A Peripheral Venous Disease, End Stage Renal Disease Date Acquired: 01/07/2019 N/A N/A Weeks of Treatment: 11 N/A N/A Wound Status: Open N/A N/A Measurements L x W x D (cm) 3x2x0.2 N/A N/A Area (cm) : 4.712 N/A N/A Volume (cm) : 0.942 N/A N/A % Reduction in Area: 85.60% N/A N/A % Reduction in Volume: 92.80% N/A N/A Classification: Full Thickness Without Exposed N/A N/A Support Structures Exudate Amount: Medium N/A N/A Exudate Type: Serous N/A N/A Exudate Color: amber N/A N/A Wound Margin: Thickened N/A N/A Granulation Amount: Large (67-100%) N/A N/A Granulation Quality: Pink N/A N/A Necrotic Amount: Small (1-33%)  N/A N/A Exposed Structures: Fat Layer (Subcutaneous Tissue) N/A N/A Exposed: Yes Fascia: No Tendon: No Muscle: No Joint: No Bone: No Epithelialization: None N/A N/A Assessment Notes: Macerated around wound. N/A N/A Treatment Notes Electronic Signature(s) Signed: 03/25/2020 3:40:28 PM By: Montey Hora Entered By: Montey Hora on 03/25/2020 14:43:44 Michael Chang, Michael Chang ([id number]) Michael Chang, Michael Chang ([id number]) -------------------------------------------------------------------------------- Multi-Disciplinary Care Plan Details Patient Name: Michael Chang Date of Service: 03/25/2020 2:30 PM Medical Record Number: [id number] Patient Account Number: [id number] Date of Birth/Sex: 1946/07/18 (73 y.o. M) Treating RN: Montey Hora Primary Care Brielynn Sekula: Alma Friendly Other Clinician: Referring Abdulrahman Bracey: Alma Friendly Treating Travelle Mcclimans/Extender: Melburn Hake, HOYT Weeks in Treatment: 21 Active Inactive Orientation to the Wound Care Program Nursing Diagnoses: Knowledge deficit related to the wound healing center program Goals: Patient/caregiver will verbalize understanding of the [facility] Program Date Initiated: 01/08/2020 Target Resolution Date: 02/05/2020 Goal Status: Active Interventions: Provide education on orientation to the wound  center Notes: Venous Leg Ulcer Nursing Diagnoses: Potential for venous Insuffiency (use before diagnosis confirmed) Goals: Patient/caregiver will verbalize understanding of disease process and disease management Date Initiated: 01/08/2020 Target Resolution Date: 02/05/2020 Goal Status: Active Interventions: Assess peripheral edema status every visit. Notes: Wound/Skin Impairment Nursing Diagnoses: Impaired tissue integrity Goals: Ulcer/skin breakdown will have a volume reduction of 30% by week 4 Date Initiated: 01/08/2020 Target Resolution Date: 02/05/2020 Goal Status: Active Interventions: Assess ulceration(s) every visit Notes: Electronic Signature(s) Signed: 03/25/2020 3:40:28 PM By: Montey Hora Entered By: Montey Hora on 03/25/2020 14:43:01 Michael Chang ([id number]) -------------------------------------------------------------------------------- Pain Assessment Details Patient Name: Michael Chang Date of Service: 03/25/2020 2:30 PM Medical Record Number: [id number] Patient Account Number: [id number] Date of Birth/Sex: 05-20-1946 (73 y.o. M) Treating RN: Army Melia Primary Care Jibri Schriefer: Alma Friendly Other Clinician: Referring Keyaria Lawson: Alma Friendly Treating Javonne Louissaint/Extender: Melburn Hake, HOYT Weeks in Treatment: 11 Active Problems Location of Pain Severity and Description of Pain Patient Has Paino No Site Locations Pain Management and Medication Current Pain Management: Electronic Signature(s) Signed: 03/25/2020 3:00:39 PM By: Army Melia Entered By: Army Melia on 03/25/2020 14:30:26 Michael Chang ([id number]) -------------------------------------------------------------------------------- Patient/Caregiver Education Details Patient Name: Michael Chang Date of Service: 03/25/2020 2:30 PM Medical Record Number: [id number] Patient Account Number: [id number] Date of Birth/Gender: May 06, 1946 (73 y.o. M) Treating RN: Montey Hora Primary Care Physician:  Alma Friendly Other Clinician: Referring Physician: Alma Friendly Treating Physician/Extender: Sharalyn Ink in Treatment: 11 Education Assessment Education Provided To: Patient and Caregiver Education Topics Provided Wound/Skin Impairment: Handouts: Other: wound care as ordered Methods: Demonstration, Explain/Verbal Responses: State content correctly Electronic Signature(s) Signed: 03/25/2020 3:47:49 PM By: Montey Hora Entered By: Montey Hora on 03/25/2020 15:46:39 Michael Chang ([id number]) -------------------------------------------------------------------------------- Wound Assessment Details Patient Name: Michael Chang Date of Service: 03/25/2020 2:30 PM Medical Record Number: [id number] Patient Account Number: [id number] Date of Birth/Sex: 10/05/1945 (73 y.o. M) Treating RN: Army Melia Primary Care Margit Batte: Alma Friendly Other Clinician: Referring Jameshia Hayashida: Alma Friendly Treating Kymberlie Brazeau/Extender: Melburn Hake, HOYT Weeks in Treatment: 11 Wound Status Wound Number: 2 Primary Venous Leg Ulcer Etiology: Wound Location: Right, Medial Ankle Wound Open Wounding Event: Gradually Appeared Status: Date Acquired: 01/07/2019 Comorbid Hypertension, Myocardial Infarction, Peripheral Venous Weeks Of Treatment: 11 History: Disease, End Stage Renal Disease Clustered Wound: No Photos Wound Measurements Length: (cm) 3 Width: (cm) 2 Depth: (cm) 0.2 Area: (cm) 4.712 Volume: (cm) 0.942 % Reduction in Area: 85.6% % Reduction in Volume: 92.8% Epithelialization: None Tunneling: No Undermining: No Wound Description Classification: Full Thickness Without Exposed Support Structu Wound Margin: Thickened Exudate Amount: Medium Exudate  Type: Serous Exudate Color: amber res Foul Odor After Cleansing: No Slough/Fibrino Yes Wound Bed Granulation Amount: Large (67-100%) Exposed Structure Granulation Quality: Pink Fascia Exposed: No Necrotic Amount: Small  (1-33%) Fat Layer (Subcutaneous Tissue) Exposed: Yes Necrotic Quality: Adherent Slough Tendon Exposed: No Muscle Exposed: No Joint Exposed: No Bone Exposed: No Assessment Notes Macerated around wound. Treatment Notes Wound #2 (Right, Medial Ankle) Notes hydrofera blue, ABD, conform, Michael Chang, Michael Chang ([id number]) Electronic Signature(s) Signed: 03/25/2020 3:00:39 PM By: Army Melia Entered By: Army Melia on 03/25/2020 14:34:47 Michael Chang ([id number]) -------------------------------------------------------------------------------- Vitals Details Patient Name: Michael Chang Date of Service: 03/25/2020 2:30 PM Medical Record Number: [id number] Patient Account Number: [id number] Date of Birth/Sex: Jun 16, 1946 (75 y.o. M) Treating RN: Army Melia Primary Care Shamel Germond: Alma Friendly Other Clinician: Referring Samoria Fedorko: Alma Friendly Treating Dagon Budai/Extender: Melburn Hake, HOYT Weeks in Treatment: 11 Vital Signs Time Taken: 14:30 Temperature (F): 98.4 Height (in): 71 Pulse (bpm): 67 Weight (lbs): 138 Respiratory Rate (breaths/min): 16 Body Mass Index (BMI): 19.2 Blood Pressure (mmHg): 149/80 Reference Range: 80 - 120 mg / dl Electronic Signature(s) Signed: 03/25/2020 3:00:39 PM By: Army Melia Entered By: Army Melia on 03/25/2020 14:30:20

## 2020-03-25 NOTE — Progress Notes (Addendum)
BEREL, NAJJAR ([id number]) Visit Report for 03/25/2020 Chief Complaint Document Details Patient Name: Michael Chang, Michael Chang Date of Service: 03/25/2020 2:30 PM Medical Record Number: [id number] Patient Account Number: [id number] Date of Birth/Sex: 29-Sep-1946 (74 y.o. M) Treating RN: Montey Hora Primary Care Provider: Alma Friendly Other Clinician: Referring Provider: Alma Friendly Treating Provider/Extender: Melburn Hake, Brylinn Teaney Weeks in Treatment: 11 Information Obtained from: Patient Chief Complaint Right foot and ankle ulcers Electronic Signature(s) Signed: 03/25/2020 2:41:58 PM By: Worthy Keeler PA-C Entered By: Worthy Keeler on 03/25/2020 14:41:57 Michael Chang ([id number]) -------------------------------------------------------------------------------- HPI Details Patient Name: Michael Chang Date of Service: 03/25/2020 2:30 PM Medical Record Number: [id number] Patient Account Number: [id number] Date of Birth/Sex: 08-09-1946 (73 y.o. M) Treating RN: Montey Hora Primary Care Provider: Alma Friendly Other Clinician: Referring Provider: Alma Friendly Treating Provider/Extender: Melburn Hake, Iley Deignan Weeks in Treatment: 11 History of Present Illness HPI Description: 01/07/2020 patient presents today for initial evaluation in our clinic concerning issues that he has been having with ulcers on his bilateral feet. More specifically at his left foot and his right ankle and on the ankle it is medial and lateral. With that being said these wounds have apparently been present for a significant amount of time in fact the patient states "20 years". When I questioned this and even ask his family member that was with him today she states that the last time she saw them was around 2006 and they were not this bad. Nonetheless I think he has had this for a significant amount of time although I am unsure of the exact locations of wounds and if some have come and gone. He does have chronic venous  insufficiency, lymphedema, hypertension, and has had a heart attack in the recent past few months. Subsequently he just moved from [place] to this area to live with family. His hypertension has been very uncontrolled he does see Dr. [name] and was placed on amlodipine 5 mg. With that being said unfortunately this does not seem according to family to have helped much with his blood pressure currently though to be honest it somewhat hard to know for sure as he had not taken the medicine before he came in today his blood pressure was significantly elevated but apparently it was when he saw his primary on Tuesday as well but then by the time he took his medicine got to the cardiologist this was much lower dropping from around 347 systolic to around 425. Nonetheless my suggestion in this regard is can be for her to keep a track of his blood pressure readings morning noon and night in order to see how this goes throughout the day for the next week and then we will address it following. In regard to the wounds we are going to see what we can do as far as helping with this currently. He also goes to vascular for testing later today. 01/14/2020 upon evaluation today patient's wound actually appears to be doing quite well in general at all locations. He has a lot of new epithelial growth which is great news. Fortunately there is no signs of active infection at this time. He did have his arterial study on the right this was a ABI of 1.44 with a TBI of 1.10 on the left an ABI of 1.46 with a TBI of 0.93. The good news is he has no signs of arterial insufficiency. The patient also did have blood pressure readings that his family member took over the past week morning and evening. Consistently  he was much lower in the evening than he was in the morning. They feel like that his amlodipine probably needs to be increased again I explained that I would give there cardiologist, Dr. [name], a call but that that is not  something that I would manage here at the wound care center as far as his blood pressure is concerned. Be more than happy to relay that information to Dr. Tyrell Antonio office however. 01/29/2020 upon evaluation today patient appears to be doing well in general with regard to his wounds. With that being said on the medial aspect he does have some purulent drainage noted which is somewhat reminiscent of potential Pseudomonas although there could be something else going on here as well. Nonetheless I think we do need to obtain a culture also think that antibiotics are probably can be necessary at this point. 02/05/2020 upon evaluation today patient appears to be doing excellent in regard to his wounds. In fact 2 wounds are completely healed as of today just the remaining right medial ankle ulcer is still open but appears to be doing much better there is no evidence of infection today he has been on the antibiotic, Levaquin, that I prescribed for him last week which was appropriate for the infection which showed positive for Morganella and Staphylococcus. 5/21. Still with an extensive area on the right medial ankle. We have been using silver collagen under Tubigrip 02/26/2020 upon evaluation today patient appears to be doing well in regard to his wounds at this point. In fact he just has 1 remaining currently and this actually showed signs of good epithelial growth. There does not appear to be any evidence of active infection at this time which is excellent news overall the patient is very pleased with how things seem to be progressing. He is having no pain at this point. 03/03/2020 upon evaluation today patient actually appears to be doing excellent in regard to his leg ulcer. This is showing signs of great improvement and overall very pleased with how things seem to be progressing. Overall he continues to make progress week by week. 03/11/2020 upon evaluation today patient appears to be doing well at this time in  regard to his ulcer. He has been tolerating the dressing changes without complication. Fortunately there is no evidence of infection and overall feel like he is progressing quite nicely. 03/25/2020 on evaluation today patient appears to be doing excellent in regard to his wound. He has been tolerating the dressing changes without complication. Fortunately there is no evidence of active infection at this time which is also great news. Electronic Signature(s) Signed: 03/25/2020 2:55:00 PM By: Worthy Keeler PA-C Entered By: Worthy Keeler on 03/25/2020 14:55:00 KOLIN, ERDAHL ([id number]) -------------------------------------------------------------------------------- Physical Exam Details Patient Name: Michael Chang Date of Service: 03/25/2020 2:30 PM Medical Record Number: [id number] Patient Account Number: [id number] Date of Birth/Sex: [birth year] (74 y.o. M) Treating RN: Montey Hora Primary Care Provider: Alma Friendly Other Clinician: Referring Provider: Alma Friendly Treating Provider/Extender: Melburn Hake, Ethelbert Thain Weeks in Treatment: 36 Constitutional Well-nourished and well-hydrated in no acute distress. Respiratory normal breathing without difficulty. Psychiatric this patient is able to make decisions and demonstrates good insight into disease process. Alert and Oriented x 3. pleasant and cooperative. Notes Patient's wound bed again showed some slough noted on the surface this was easily wiped away with saline gauze there was good epithelial tissue around the edges of the wound and overall I am extremely pleased with where we stand currently. The patient and  his family member are both very pleased as well. Electronic Signature(s) Signed: 03/25/2020 2:55:20 PM By: Worthy Keeler PA-C Entered By: Worthy Keeler on 03/25/2020 14:55:19 Michael Chang ([id number]) -------------------------------------------------------------------------------- Physician Orders Details Patient  Name: Michael Chang Date of Service: 03/25/2020 2:30 PM Medical Record Number: [id number] Patient Account Number: [id number] Date of Birth/Sex: November 07, 1945 (73 y.o. 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 o Cleanse wound with mild soap and water Primary Wound Dressing Wound #2 Right,Medial Ankle o Hydrafera Blue Ready Transfer Secondary Dressing Wound #2 Right,Medial Ankle o ABD and Kerlix/Conform Dressing Change Frequency Wound #2 Right,Medial Ankle o Change dressing every other day. Follow-up Appointments o Return Appointment in 2 weeks. Edema Control Wound #2 Right,Medial Ankle o Other: - TubiGrip E Electronic Signature(s) Signed: 03/25/2020 3:40:28 PM By: Montey Hora Signed: 03/25/2020 3:42:29 PM By: Worthy Keeler PA-C Entered By: Montey Hora on 03/25/2020 14:46:09 Michael Chang ([id number]) -------------------------------------------------------------------------------- Problem List Details Patient Name: Michael Chang Date of Service: 03/25/2020 2:30 PM Medical Record Number: [id number] Patient Account Number: [id number] Date of Birth/Sex: [birth year] (73 y.o. M) Treating RN: Montey Hora Primary Care Provider: Alma Friendly Other Clinician: Referring Provider: Alma Friendly Treating Provider/Extender: Melburn Hake, Alba Perillo Weeks in Treatment: 11 Active Problems ICD-10 Encounter Code Description Active Date MDM Diagnosis I87.2 Venous insufficiency (chronic) (peripheral) 01/07/2020 No Yes I89.0 Lymphedema, not elsewhere classified 01/07/2020 No Yes L97.522 Non-pressure chronic ulcer of other part of left foot with fat layer 01/07/2020 No Yes exposed L97.312 Non-pressure chronic ulcer of right ankle with fat layer exposed 01/07/2020 No Yes I10 Essential (primary) hypertension 01/07/2020 No Yes I25.10 Atherosclerotic heart disease of native coronary artery without angina 01/07/2020 No Yes pectoris Inactive Problems Resolved Problems Electronic Signature(s) Signed: 03/25/2020 2:41:52 PM By: Worthy Keeler PA-C Entered By: Worthy Keeler on 03/25/2020 14:41:51 Michael Chang ([id number]) -------------------------------------------------------------------------------- Progress Note Details Patient Name: Michael Chang Date of Service: 03/25/2020 2:30 PM Medical Record Number: [id number] Patient Account Number: [id number] Date of Birth/Sex: [birth year] (73 y.o. M) Treating RN: Montey Hora Primary Care Provider: Alma Friendly Other Clinician: Referring Provider: Alma Friendly Treating Provider/Extender: Melburn Hake, Anjali Manzella Weeks in Treatment: 11 Subjective Chief Complaint Information obtained from Patient Right foot and ankle ulcers History of Present Illness (HPI) 01/07/2020 patient presents today for initial evaluation in our clinic concerning issues that he has been having with ulcers on his bilateral feet. More specifically at his left foot and his right ankle and on the ankle it is medial and lateral. With that being said these wounds have apparently been present for a significant amount of time in fact the patient states "20 years". When I questioned this and even ask his family member that was with him today she states that the last time she saw them was  around 2006 and they were not this bad. Nonetheless I think he has had this for a significant amount of time although I am unsure of the exact locations of wounds and if  some have come and gone. He does have chronic venous insufficiency, lymphedema, hypertension, and has had a heart attack in the recent past few months. Subsequently he just moved from [place] to this area to live with family. His hypertension has been very uncontrolled he does see Dr. [name] and was placed on amlodipine 5 mg. With that being said unfortunately this does not seem according to family to have helped much with his blood pressure currently though to be honest it somewhat hard to know for sure as he had not taken the medicine before he came in today his blood pressure was significantly elevated but apparently it was when he saw his primary on Tuesday as well but then by the time he took his medicine got to the cardiologist this was much lower dropping from around 323 systolic to around 557. Nonetheless my suggestion in this regard is can be for her to keep a track of his blood pressure readings morning noon and night in order to see how this goes throughout the day for the next week and then we will address it following. In regard to the wounds we are going to see what we can do as far as helping with this currently. He also goes to vascular for testing later today. 01/14/2020 upon evaluation today patient's wound actually appears to be doing quite well in general at all locations. He has a lot of new epithelial growth which is great news. Fortunately there is no signs of active infection at this time. He did have his arterial study on the right this was a ABI of 1.44 with a TBI of 1.10 on the left an ABI of 1.46 with a TBI of 0.93. The good news is he has no signs of arterial insufficiency. The patient also did have blood pressure readings that his family member took over the past week morning and evening. Consistently he  was much lower in the evening than he was in the morning. They feel like that his amlodipine probably needs to be increased again I explained that I would give there cardiologist, Dr. [name], a call but that that is not something that I would manage here at the wound care center as far as his blood pressure is concerned. Be more than happy to relay that information to Dr. Tyrell Antonio office however. 01/29/2020 upon evaluation today patient appears to be doing well in general with regard to his wounds. With that being said on the medial aspect he does have some purulent drainage noted which is somewhat reminiscent of potential Pseudomonas although there could be something else going on here as well. Nonetheless I think we do need to obtain a culture also think that antibiotics are probably can be necessary at this point. 02/05/2020 upon evaluation today patient appears to be doing excellent in regard to his wounds. In fact 2 wounds are completely healed as of today just the remaining right medial ankle ulcer is still open but appears to be doing much better there is no evidence of infection today he has been on the antibiotic, Levaquin, that I prescribed for him last week which was appropriate for the infection which showed positive for Morganella and Staphylococcus. 5/21. Still with an extensive area on the right medial ankle. We have been using silver collagen under Tubigrip 02/26/2020 upon evaluation today patient appears to be doing well in regard to his wounds at this point. In fact he just has 1 remaining currently and this actually showed signs of good  epithelial growth. There does not appear to be any evidence of active infection at this time which is excellent news overall the patient is very pleased with how things seem to be progressing. He is having no pain at this point. 03/03/2020 upon evaluation today patient actually appears to be doing excellent in regard to his leg ulcer. This is showing signs  of great improvement and overall very pleased with how things seem to be progressing. Overall he continues to make progress week by week. 03/11/2020 upon evaluation today patient appears to be doing well at this time in regard to his ulcer. He has been tolerating the dressing changes without complication. Fortunately there is no evidence of infection and overall feel like he is progressing quite nicely. 03/25/2020 on evaluation today patient appears to be doing excellent in regard to his wound. He has been tolerating the dressing changes without complication. Fortunately there is no evidence of active infection at this time which is also great news. Objective Constitutional Well-nourished and well-hydrated in no acute distress. SHERREL, SHAFER ([id number]) Vitals Time Taken: 2:30 PM, Height: 71 in, Weight: 138 lbs, BMI: 19.2, Temperature: 98.4 F, Pulse: 67 bpm, Respiratory Rate: 16 breaths/min, Blood Pressure: 149/80 mmHg. Respiratory normal breathing without difficulty. Psychiatric this patient is able to make decisions and demonstrates good insight into disease process. Alert and Oriented x 3. pleasant and cooperative. General Notes: Patient's wound bed again showed some slough noted on the surface this was easily wiped away with saline gauze there was good epithelial tissue around the edges of the wound and overall I am extremely pleased with where we stand currently. The patient and his family member are both very pleased as well. Integumentary (Hair, Skin) Wound #2 status is Open. Original cause of wound was Gradually Appeared. The wound is located on the Right,Medial Ankle. The wound measures 3cm length x 2cm width x 0.2cm depth; 4.712cm^2 area and 0.942cm^3 volume. There is Fat Layer (Subcutaneous Tissue) Exposed exposed. There is no tunneling or undermining noted. There is a medium amount of serous drainage noted. The wound margin is thickened. There is large (67-100%) pink granulation  within the wound bed. There is a small (1-33%) amount of necrotic tissue within the wound bed including Adherent Slough. General Notes: Macerated around wound. 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 Cleanse wound with mild soap and water Primary Wound Dressing: Wound #2 Right,Medial Ankle: Hydrafera Blue Ready Transfer Secondary Dressing: Wound #2 Right,Medial Ankle: ABD and Kerlix/Conform Dressing Change Frequency: Wound #2 Right,Medial Ankle: Change dressing every other day. Follow-up Appointments: Return Appointment in 2 weeks. Edema Control: Wound #2 Right,Medial Ankle: Other: - TubiGrip E 1. I would recommend currently that we continue with the [facility] I think that still the best option for the patient. 2. I am also can recommend that we continue to monitor for any signs of worsening or infection although right now he seems to be progressing quite nicely. We will see patient back for reevaluation in 2 weeks here in the clinic. If anything worsens or changes patient will contact our office for additional recommendations. KEMAL, AMORES ([id number]) Electronic Signature(s) Signed: 03/25/2020 2:55:38 PM By: Worthy Keeler PA-C Entered By:  Worthy Keeler on 03/25/2020 14:55:37 GLENROY, CROSSEN ([id number]) -------------------------------------------------------------------------------- SuperBill Details Patient Name: Michael Chang Date of Service: 03/25/2020 Medical Record Number: [id number] Patient Account Number: [id number] Date of Birth/Sex: 1945/11/29 (74  y.o. 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 [place] (primary) hypertension I25.10 Atherosclerotic heart disease of native coronary artery without angina pectoris Facility Procedures CPT4 Code: 21115520 Description: 99213 - WOUND CARE VISIT-LEV 3 EST PT Modifier: Quantity: 1 Physician Procedures CPT4 Code: 8022336 Description: 99213 - WC PHYS LEVEL 3 - EST PT Modifier: Quantity: 1 CPT4 Code: Description: ICD-10 Diagnosis Description I87.2 Venous insufficiency (chronic) (peripheral) I89.0 Lymphedema, not elsewhere classified L97.522 Non-pressure chronic ulcer of other part of left foot with fat layer ex L97.312 Non-pressure chronic ulcer of  right ankle with fat layer exposed Modifier: posed Quantity: Electronic Signature(s) Signed: 03/25/2020 3:46:18 PM By: Montey Hora Previous Signature: 03/25/2020 2:55:52 PM Version By: Worthy Keeler PA-C Entered By: Montey Hora on 03/25/2020 15:46:18

## 2020-03-28 ENCOUNTER — Encounter (INDEPENDENT_AMBULATORY_CARE_PROVIDER_SITE_OTHER): Payer: Self-pay | Admitting: Ophthalmology

## 2020-03-28 ENCOUNTER — Other Ambulatory Visit: Payer: Self-pay

## 2020-03-28 ENCOUNTER — Ambulatory Visit (INDEPENDENT_AMBULATORY_CARE_PROVIDER_SITE_OTHER): Payer: Medicare Other | Admitting: Ophthalmology

## 2020-03-28 DIAGNOSIS — H35372 Puckering of macula, left eye: Secondary | ICD-10-CM | POA: Diagnosis not present

## 2020-03-28 DIAGNOSIS — H18231 Secondary corneal edema, right eye: Secondary | ICD-10-CM

## 2020-03-28 DIAGNOSIS — Z09 Encounter for follow-up examination after completed treatment for conditions other than malignant neoplasm: Secondary | ICD-10-CM

## 2020-03-28 DIAGNOSIS — H3530 Unspecified macular degeneration: Secondary | ICD-10-CM

## 2020-03-28 DIAGNOSIS — H2701 Aphakia, right eye: Secondary | ICD-10-CM

## 2020-03-28 NOTE — Progress Notes (Signed)
03/28/2020     CHIEF COMPLAINT Patient presents for Post-op Follow-up   HISTORY OF PRESENT ILLNESS: Michael Chang is a 74 y.o. male who presents to the clinic today for:   HPI    Post-op Follow-up    In right eye.  Discomfort includes Negative for pain and itching.  Vision is stable.          Comments    2 week PO OD - OCT OU Patient denies change in vision and overall has no complaints.          Last edited by Michael Horn, MD on 03/28/2020  9:44 AM. (History)      Referring physician: [name], NP Michael Chang,  [place] [address]  HISTORICAL INFORMATION:   Selected notes from the MEDICAL RECORD NUMBER       CURRENT MEDICATIONS: No current outpatient medications on file. (Ophthalmic Drugs)   No current facility-administered medications for this visit. (Ophthalmic Drugs)   Current Outpatient Medications (Other)  Medication Sig  . amLODipine (NORVASC) 10 MG tablet Take 10 mg by mouth daily.  [name] aspirin 81 MG EC tablet Take by mouth.  . carvedilol (COREG) 12.5 MG tablet Take 1 tablet (12.5 mg total) by mouth 2 (two) times daily with a meal.  . hydrALAZINE (APRESOLINE) 100 MG tablet Take 1 tablet (100 mg total) by mouth 2 (two) times daily.  . isosorbide mononitrate (IMDUR) 30 MG 24 hr tablet Take 2 tablets by mouth in the AM, and take 1/2 tablet by mouth in the PM.  . isosorbide mononitrate (IMDUR) 60 MG 24 hr tablet Take 1 tablet (60 mg total) by mouth daily. For blood pressure.  . Multiple Vitamin (MULTIVITAMIN WITH MINERALS) TABS tablet Take 1 tablet by mouth daily.  . Multiple Vitamins-Minerals (CENTRUM SILVER 50+MEN) TABS Take by mouth.   No current facility-administered medications for this visit. (Other)      REVIEW OF SYSTEMS:    ALLERGIES No Known Allergies  PAST MEDICAL HISTORY Past Medical History:  Diagnosis Date  . Anemia   . CAD (coronary artery disease)   . Chronic combined systolic (congestive) and diastolic  (congestive) heart failure ([place])   . Chronic ulcer of right lower extremity ([place])   . CKD (chronic kidney disease), stage III   . Essential hypertension   . HLD (hyperlipidemia)   . NSTEMI (non-ST elevated myocardial infarction) ([place])   . PVD (peripheral vascular disease) ([place])    History reviewed. No pertinent surgical history.  FAMILY HISTORY History reviewed. No pertinent family history.  SOCIAL HISTORY Social History   Tobacco Use  . Smoking status: Never Smoker  . Smokeless tobacco: Never Used  Substance Use Topics  . Alcohol use: Not Currently  . Drug use: Not on file         OPHTHALMIC EXAM:  Base Eye Exam    Visual Acuity (Snellen - Linear)      Right Left   Dist [employer] CF @ 7' CF @ 3'   Dist ph [place] NI NI       Tonometry (Tonopen, 8:53 AM)      Right Left   Pressure 13 19       Neuro/Psych    Oriented x3: Yes   Mood/Affect: Normal       Dilation    Right eye: 1.0% Mydriacyl, 2.5% Phenylephrine @ 8:53 AM        Slit Lamp and Fundus Exam    External  Exam      Right Left   External Normal Normal       Slit Lamp Exam      Right Left   Lids/Lashes Normal    Conjunctiva/Sclera White and quiet    Cornea Descemet's folds 1+, , 1+ Edema    Anterior Chamber Deep and quiet    Iris Round and reactive    Lens Posterior chamber intraocular lens    Anterior Vitreous Normal        Fundus Exam      Right Left   Posterior Vitreous Clear vitrectomized    Disc PERI papillary atrophy    C/D Ratio 0.35    Macula Normal    Vessels Normal    Periphery Good retinopexy inferiorly at 5 and 6:00 meridians to retinal holes           IMAGING AND PROCEDURES  Imaging and Procedures for 03/28/20  OCT, Retina - OU - Both Eyes       Right Eye Quality was poor. Scan locations included subfoveal.   Left Eye Quality was poor. Scan locations included subfoveal.   Notes Poor detailed secondary OU to medial opacity                 ASSESSMENT/PLAN:  Aphakia of eye, right This condition is corrected now with pseudophakia  Secondary corneal edema, right This corneal edema is improving albeit slowly.  Normal edema and epithelial edema required at the time of surgery epithelial debridement for visualization purposes.  The epithelium has now regrown and there is no abrasion.  nonetheless central corneal stroma thickening persists at this time      ICD-10-CM   1. Macular pucker, left eye  H35.372 OCT, Retina - OU - Both Eyes  2. Myopic macular degeneration of both eyes  H35.30 OCT, Retina - OU - Both Eyes  3. Follow-up examination after eye surgery  Z09   4. Aphakia of eye, right  H27.01   5. Secondary corneal edema, right  H18.231     1.  OD, improved anterior segment anatomy.  Vision still limited by central corneal stromal edema.  2.  Patient continues on Durezol 3 times daily, Besivance 3 times daily and PROLENSA and daily.  3.  Vast the patient to follow-up with Dr. Talbert Forest within a couple of weeks to monitor the corneal condition  Ophthalmic Meds Ordered this visit:  No orders of the defined types were placed in this encounter.      Return in about 10 weeks (around 06/06/2020) for dilate, OD, COLOR FP.  There are no Patient Instructions on file for this visit.   Explained the diagnoses, plan, and follow up with the patient and they expressed understanding.  Patient expressed understanding of the importance of proper follow up care.   Clent Demark [name] M.D. Diseases & Surgery of the Retina and Vitreous [facility] 03/28/20     Abbreviations: M myopia (nearsighted); A astigmatism; H hyperopia (farsighted); P presbyopia; Mrx spectacle prescription;  CTL contact lenses; OD right eye; OS left eye; OU both eyes  XT exotropia; ET esotropia; PEK punctate epithelial keratitis; PEE punctate epithelial erosions; DES dry eye syndrome; MGD meibomian gland dysfunction; ATs artificial tears; PFAT's  preservative free artificial tears; [place] nuclear sclerotic cataract; PSC posterior subcapsular cataract; ERM epi-retinal membrane; PVD posterior vitreous detachment; RD retinal detachment; DM diabetes mellitus; DR diabetic retinopathy; NPDR non-proliferative diabetic retinopathy; PDR proliferative diabetic retinopathy; CSME clinically significant macular edema; DME diabetic  macular edema; dbh dot blot hemorrhages; CWS cotton wool spot; POAG primary open angle glaucoma; C/D cup-to-disc ratio; HVF humphrey visual field; GVF goldmann visual field; OCT optical coherence tomography; IOP intraocular pressure; BRVO Branch retinal vein occlusion; CRVO central retinal vein occlusion; CRAO central retinal artery occlusion; BRAO branch retinal artery occlusion; RT retinal tear; SB scleral buckle; PPV pars plana vitrectomy; VH Vitreous hemorrhage; PRP panretinal laser photocoagulation; IVK intravitreal kenalog; VMT vitreomacular traction; MH Macular hole;  NVD neovascularization of the disc; NVE neovascularization elsewhere; AREDS age related eye disease study; ARMD age related macular degeneration; POAG primary open angle glaucoma; EBMD epithelial/anterior basement membrane dystrophy; ACIOL anterior chamber intraocular lens; IOL intraocular lens; PCIOL posterior chamber intraocular lens; Phaco/IOL phacoemulsification with intraocular lens placement; [place] photorefractive keratectomy; LASIK laser assisted in situ keratomileusis; HTN hypertension; DM diabetes mellitus; COPD chronic obstructive pulmonary disease

## 2020-03-28 NOTE — Assessment & Plan Note (Signed)
This condition is corrected now with pseudophakia

## 2020-03-28 NOTE — Assessment & Plan Note (Addendum)
This corneal edema is improving albeit slowly.  Normal edema and epithelial edema required at the time of surgery epithelial debridement for visualization purposes.  The epithelium has now regrown and there is no abrasion.  nonetheless central corneal stroma thickening persists at this time

## 2020-03-30 DIAGNOSIS — S0181XD Laceration without foreign body of other part of head, subsequent encounter: Secondary | ICD-10-CM | POA: Diagnosis not present

## 2020-03-30 DIAGNOSIS — M545 Low back pain: Secondary | ICD-10-CM | POA: Diagnosis not present

## 2020-03-30 DIAGNOSIS — I5022 Chronic systolic (congestive) heart failure: Secondary | ICD-10-CM | POA: Diagnosis not present

## 2020-03-30 DIAGNOSIS — W19XXXD Unspecified fall, subsequent encounter: Secondary | ICD-10-CM | POA: Diagnosis not present

## 2020-03-30 DIAGNOSIS — R55 Syncope and collapse: Secondary | ICD-10-CM | POA: Diagnosis not present

## 2020-03-30 DIAGNOSIS — N183 Chronic kidney disease, stage 3 unspecified: Secondary | ICD-10-CM | POA: Diagnosis not present

## 2020-03-30 DIAGNOSIS — I252 Old myocardial infarction: Secondary | ICD-10-CM | POA: Diagnosis not present

## 2020-03-30 DIAGNOSIS — I2510 Atherosclerotic heart disease of native coronary artery without angina pectoris: Secondary | ICD-10-CM | POA: Diagnosis not present

## 2020-03-30 DIAGNOSIS — I739 Peripheral vascular disease, unspecified: Secondary | ICD-10-CM | POA: Diagnosis not present

## 2020-03-30 DIAGNOSIS — I130 Hypertensive heart and chronic kidney disease with heart failure and stage 1 through stage 4 chronic kidney disease, or unspecified chronic kidney disease: Secondary | ICD-10-CM | POA: Diagnosis not present

## 2020-03-30 DIAGNOSIS — H269 Unspecified cataract: Secondary | ICD-10-CM | POA: Diagnosis not present

## 2020-03-30 DIAGNOSIS — L97319 Non-pressure chronic ulcer of right ankle with unspecified severity: Secondary | ICD-10-CM | POA: Diagnosis not present

## 2020-03-30 DIAGNOSIS — D509 Iron deficiency anemia, unspecified: Secondary | ICD-10-CM | POA: Diagnosis not present

## 2020-03-31 ENCOUNTER — Encounter (INDEPENDENT_AMBULATORY_CARE_PROVIDER_SITE_OTHER): Payer: Medicare Other | Admitting: Ophthalmology

## 2020-04-01 LAB — HEPATITIS C ANTIBODY
Hepatitis C Ab: REACTIVE — AB
SIGNAL TO CUT-OFF: 24 — ABNORMAL HIGH (ref <1.00)

## 2020-04-01 LAB — HCV RNA,QUANTITATIVE REAL TIME PCR
HCV Quantitative Log: 4.73 Log IU/mL — ABNORMAL HIGH
HCV RNA, PCR, QN: 54300 IU/mL — ABNORMAL HIGH

## 2020-04-08 ENCOUNTER — Encounter: Payer: Medicare Other | Attending: Physician Assistant | Admitting: Physician Assistant

## 2020-04-08 ENCOUNTER — Other Ambulatory Visit: Payer: Self-pay

## 2020-04-08 DIAGNOSIS — I120 Hypertensive chronic kidney disease with stage 5 chronic kidney disease or end stage renal disease: Secondary | ICD-10-CM | POA: Diagnosis not present

## 2020-04-08 DIAGNOSIS — I890 Lymphedema, not elsewhere classified: Secondary | ICD-10-CM | POA: Insufficient documentation

## 2020-04-08 DIAGNOSIS — N186 End stage renal disease: Secondary | ICD-10-CM | POA: Insufficient documentation

## 2020-04-08 DIAGNOSIS — L97312 Non-pressure chronic ulcer of right ankle with fat layer exposed: Secondary | ICD-10-CM | POA: Diagnosis not present

## 2020-04-08 DIAGNOSIS — L97512 Non-pressure chronic ulcer of other part of right foot with fat layer exposed: Secondary | ICD-10-CM | POA: Diagnosis not present

## 2020-04-08 DIAGNOSIS — I872 Venous insufficiency (chronic) (peripheral): Secondary | ICD-10-CM | POA: Insufficient documentation

## 2020-04-08 DIAGNOSIS — I2510 Atherosclerotic heart disease of native coronary artery without angina pectoris: Secondary | ICD-10-CM | POA: Insufficient documentation

## 2020-04-08 DIAGNOSIS — L97522 Non-pressure chronic ulcer of other part of left foot with fat layer exposed: Secondary | ICD-10-CM | POA: Insufficient documentation

## 2020-04-08 DIAGNOSIS — I252 Old myocardial infarction: Secondary | ICD-10-CM | POA: Diagnosis not present

## 2020-04-11 DIAGNOSIS — I87311 Chronic venous hypertension (idiopathic) with ulcer of right lower extremity: Secondary | ICD-10-CM | POA: Diagnosis not present

## 2020-04-11 DIAGNOSIS — H2512 Age-related nuclear cataract, left eye: Secondary | ICD-10-CM | POA: Diagnosis not present

## 2020-04-12 NOTE — Progress Notes (Deleted)
Cardiology Office Note    Date:  04/12/2020   ID:  Michael Chang, DOB 04/26/1946, MRN [id number]  PCP:  [name], NP  Cardiologist:  [name], MD  Electrophysiologist:  None   Chief Complaint: Follow up  History of Present Illness:   Michael Chang is a 74 y.o. male with history of CAD, chronic combined systolic and diastolic CHF with subsequent normalization of LVSF, PAD with chronic right lower extremity ulceration with normal ABI in 11/2019 followed by vascular surgery, CKD stage IV, HTN, HLD, anemia, prior alcohol use quitting in 2009, and falls/gait instability who presents for ***.   He moved from [place] to [place] to be closer to family. Previously, he did not seek medical attention for many years, possibly dating back to 2007, until he was diagnosed with heart failure in 11/2019 in [place] with an EF of 40-45%.  His initial presentation was felt to possibly be a syncopal episode. He did have a NSTEMI, and was managed medically. During his admission, he was anemic with EGD showing moderately erythematous stomach mucosa and colonoscopy with internal hemorrhoids. The chronicity of his renal dysfunction was uncertain. He established with Dr. [name] in 12/2019 with noted stable exertional dyspnea. He was admitted to [facility] from 5/16 to 5/17 for presumed fall. Echo on 02/14/2020 showed an EF of 55-60%, no RWMA, severe LVH, Gr1DD, RVSF and RV cavity size normal, mildly dilated left atrium, mild tricuspid regurgitation. Covid negative. HS-Tn 44 with a delta of 36. He was not orthostatic. He declined IV iron for his anemia. Norvasc was decreased at discharge, he was otherwise continued on Coreg, hydralazine and Imdur.  He was seen in hospital follow-up on 03/09/2020 and was doing well since his hospital discharge.  With elevated BPs nephrology had titrated his amlodipine back to 10 mg daily.  He reported BPs in the 160s/80s and in the evening in the 140-150/70-80 range.  He felt like a good BP for him  would be in the 784 systolic range as he became dizzy and lethargic with BP much lower than this.  Imdur 15 mg each afternoon was added, otherwise he was continued on his regimen of amlodipine, carvedilol, and hydralazine.  ***   Labs independently reviewed: 01/2020 - HGB 9.2, PLT 278, potassium 4.2, BUN 43, SCr 2.06, albumin 2.9, AST/ALT normal 12/2019 - TSH normal, TC 133, TG 62, HDL 43, LDL 77  Past Medical History:  Diagnosis Date  . Anemia   . CAD (coronary artery disease)   . Chronic combined systolic (congestive) and diastolic (congestive) heart failure ([place])   . Chronic ulcer of right lower extremity ([place])   . CKD (chronic kidney disease), stage III   . Essential hypertension   . HLD (hyperlipidemia)   . NSTEMI (non-ST elevated myocardial infarction) ([place])   . PVD (peripheral vascular disease) ([place])     No past surgical history on file.  Current Medications: No outpatient medications have been marked as taking for the 04/18/20 encounter (Appointment) with Rise Mu, PA-C.    Allergies:   Patient has no known allergies.   Social History   Socioeconomic History  . Marital status: Single    Spouse name: Not on file  . Number of children: Not on file  . Years of education: Not on file  . Highest education level: Not on file  Occupational History  . Not on file  Tobacco Use  . Smoking status: Never Smoker  . Smokeless tobacco: Never Used  Substance  and Sexual Activity  . Alcohol use: Not Currently  . Drug use: Not on file  . Sexual activity: Not on file  Other Topics Concern  . Not on file  Social History Narrative  . Not on file   Social Determinants of Health   Financial Resource Strain: Low Risk   . Difficulty of Paying Living Expenses: Not hard at all  Food Insecurity: No Food Insecurity  . Worried About Charity fundraiser in the Last Year: Never true  . Ran Out of Food in the Last Year: Never true  Transportation Needs: No Transportation Needs  .  Lack of Transportation (Medical): No  . Lack of Transportation (Non-Medical): No  Physical Activity: Inactive  . Days of Exercise per Week: 0 days  . Minutes of Exercise per Session: 0 min  Stress: No Stress Concern Present  . Feeling of Stress : Not at all  Social Connections:   . Frequency of Communication with Friends and Family:   . Frequency of Social Gatherings with Friends and Family:   . Attends Religious Services:   . Active Member of Clubs or Organizations:   . Attends Archivist Meetings:   [name] Marital Status:      Family History:  The patient's family history is not on file.  ROS:   ROS   EKGs/Labs/Other Studies Reviewed:    Studies reviewed were summarized above. The additional studies were reviewed today:  2D echo 01/2020: 1. Left ventricular ejection fraction, by estimation, is 55 to 60%. The  left ventricle has normal function. The left ventricle has no regional  wall motion abnormalities. There is severe left ventricular hypertrophy.  Left ventricular diastolic parameters  are consistent with Grade I diastolic dysfunction (impaired relaxation).  2. Right ventricular systolic function is normal. The right ventricular  size is normal.  3. Left atrial size was mildly dilated. __________  EKG:  EKG is ordered today.  The EKG ordered today demonstrates ***  Recent Labs: 01/05/2020: TSH 4.17 02/14/2020: ALT 16 02/15/2020: BUN 43; Creatinine, Ser 2.06; Potassium 4.2; Sodium 140 02/18/2020: Hemoglobin 9.2; Platelets 278.0  Recent Lipid Panel    Component Value Date/Time   CHOL 133 01/05/2020 1028   TRIG 62.0 01/05/2020 1028   HDL 43.70 01/05/2020 1028   CHOLHDL 3 01/05/2020 1028   VLDL 12.4 01/05/2020 1028   LDLCALC 77 01/05/2020 1028    PHYSICAL EXAM:    VS:  There were no vitals taken for this visit.  BMI: There is no height or weight on file to calculate BMI.  Physical Exam  Wt Readings from Last 3 Encounters:  03/09/20 144 lb 6 oz (65.5  kg)  03/03/20 143 lb (64.9 kg)  02/18/20 139 lb 8 oz (63.3 kg)     ASSESSMENT & PLAN:   1. ***  Disposition: F/u with Dr. [name] or an APP in ***.   Medication Adjustments/Labs and Tests Ordered: Current medicines are reviewed at length with the patient today.  Concerns regarding medicines are outlined above. Medication changes, Labs and Tests ordered today are summarized above and listed in the Patient Instructions accessible in Encounters.   Signed, Christell Faith, PA-C 04/12/2020 9:57 AM     [facility] [address] [place] [address] [place], [place] [address] [phone]

## 2020-04-13 NOTE — Progress Notes (Addendum)
BERNON, ARVISO ([id number]) Visit Report for 04/08/2020 Chief Complaint Document Details Patient Name: Michael Chang, Michael Chang Date of Service: 04/08/2020 2:30 PM Medical Record Number: [id number] Patient Account Number: [id number] Date of Birth/Sex: 1946-03-20 (74 y.o. M) Treating RN: Cornell Barman Primary Care Provider: Alma Friendly Other Clinician: Referring Provider: Alma Friendly Treating Provider/Extender: Melburn Hake, Millee Denise Weeks in Treatment: 13 Information Obtained from: Patient Chief Complaint Right foot and ankle ulcers Electronic Signature(s) Signed: 04/08/2020 2:33:07 PM By: Worthy Keeler PA-C Entered By: Worthy Keeler on 04/08/2020 14:33:07 Michael Chang ([id number]) -------------------------------------------------------------------------------- HPI Details Patient Name: Michael Chang Date of Service: 04/08/2020 2:30 PM Medical Record Number: [id number] Patient Account Number: [id number] Date of Birth/Sex: 04-May-1946 (74 y.o. M) Treating RN: Cornell Barman Primary Care Provider: Alma Friendly Other Clinician: Referring Provider: Alma Friendly Treating Provider/Extender: Melburn Hake, Kourtnie Sachs Weeks in Treatment: 13 History of Present Illness HPI Description: 01/07/2020 patient presents today for initial evaluation in our clinic concerning issues that he has been having with ulcers on his bilateral feet. More specifically at his left foot and his right ankle and on the ankle it is medial and lateral. With that being said these wounds have apparently been present for a significant amount of time in fact the patient states "20 years". When I questioned this and even ask his family member that was with him today she states that the last time she saw them was around 2006 and they were not this bad. Nonetheless I think he has had this for a significant amount of time although I am unsure of the exact locations of wounds and if some have come and gone. He does have chronic venous  insufficiency, lymphedema, hypertension, and has had a heart attack in the recent past few months. Subsequently he just moved from [place] to this area to live with family. His hypertension has been very uncontrolled he does see Dr. [name] and was placed on amlodipine 5 mg. With that being said unfortunately this does not seem according to family to have helped much with his blood pressure currently though to be honest it somewhat hard to know for sure as he had not taken the medicine before he came in today his blood pressure was significantly elevated but apparently it was when he saw his primary on Tuesday as well but then by the time he took his medicine got to the cardiologist this was much lower dropping from around 662 systolic to around 947. Nonetheless my suggestion in this regard is can be for her to keep a track of his blood pressure readings morning noon and night in order to see how this goes throughout the day for the next week and then we will address it following. In regard to the wounds we are going to see what we can do as far as helping with this currently. He also goes to vascular for testing later today. 01/14/2020 upon evaluation today patient's wound actually appears to be doing quite well in general at all locations. He has a lot of new epithelial growth which is great news. Fortunately there is no signs of active infection at this time. He did have his arterial study on the right this was a ABI of 1.44 with a TBI of 1.10 on the left an ABI of 1.46 with a TBI of 0.93. The good news is he has no signs of arterial insufficiency. The patient also did have blood pressure readings that his family member took over the past week morning and evening. Consistently  he was much lower in the evening than he was in the morning. They feel like that his amlodipine probably needs to be increased again I explained that I would give there cardiologist, Dr. [name], a call but that that is not  something that I would manage here at the wound care center as far as his blood pressure is concerned. Be more than happy to relay that information to Dr. Tyrell Antonio office however. 01/29/2020 upon evaluation today patient appears to be doing well in general with regard to his wounds. With that being said on the medial aspect he does have some purulent drainage noted which is somewhat reminiscent of potential Pseudomonas although there could be something else going on here as well. Nonetheless I think we do need to obtain a culture also think that antibiotics are probably can be necessary at this point. 02/05/2020 upon evaluation today patient appears to be doing excellent in regard to his wounds. In fact 2 wounds are completely healed as of today just the remaining right medial ankle ulcer is still open but appears to be doing much better there is no evidence of infection today he has been on the antibiotic, Levaquin, that I prescribed for him last week which was appropriate for the infection which showed positive for Morganella and Staphylococcus. 5/21. Still with an extensive area on the right medial ankle. We have been using silver collagen under Tubigrip 02/26/2020 upon evaluation today patient appears to be doing well in regard to his wounds at this point. In fact he just has 1 remaining currently and this actually showed signs of good epithelial growth. There does not appear to be any evidence of active infection at this time which is excellent news overall the patient is very pleased with how things seem to be progressing. He is having no pain at this point. 03/03/2020 upon evaluation today patient actually appears to be doing excellent in regard to his leg ulcer. This is showing signs of great improvement and overall very pleased with how things seem to be progressing. Overall he continues to make progress week by week. 03/11/2020 upon evaluation today patient appears to be doing well at this time in  regard to his ulcer. He has been tolerating the dressing changes without complication. Fortunately there is no evidence of infection and overall feel like he is progressing quite nicely. 03/25/2020 on evaluation today patient appears to be doing excellent in regard to his wound. He has been tolerating the dressing changes without complication. Fortunately there is no evidence of active infection at this time which is also great news. 04/08/2020 upon evaluation today patient appears to be doing well with regard to his wound is not looking any worse but also unfortunately not making the progress that we really would like to be seen here. Fortunately there is no signs of active infection at this time. No fevers, chills, nausea, vomiting, or diarrhea. Electronic Signature(s) Signed: 04/08/2020 2:58:41 PM By: Worthy Keeler PA-C Entered By: Worthy Keeler on 04/08/2020 14:58:41 CHISUM, HABENICHT ([id number]) -------------------------------------------------------------------------------- Physical Exam Details Patient Name: Michael Chang Date of Service: 04/08/2020 2:30 PM Medical Record Number: [id number] Patient Account Number: [id number] Date of Birth/Sex: 10-Oct-1945 (74 y.o. M) Treating RN: Cornell Barman Primary Care Provider: Alma Friendly Other Clinician: Referring Provider: Alma Friendly Treating Provider/Extender: Melburn Hake, Adeyemi Hamad Weeks in Treatment: 52 Constitutional Well-nourished and well-hydrated in no acute distress. Respiratory normal breathing without difficulty. Psychiatric this patient is able to make decisions and demonstrates good insight into disease  process. Alert and Oriented x 3. pleasant and cooperative. Notes Patient's wound bed currently showed signs of good granulation at this point. Fortunately there is no evidence of active infection and overall very pleased with where things stand. No fevers, chills, nausea, vomiting, or diarrhea. Electronic Signature(s) Signed:  04/08/2020 2:58:58 PM By: Worthy Keeler PA-C Entered By: Worthy Keeler on 04/08/2020 14:58:57 Michael Chang ([id number]) -------------------------------------------------------------------------------- Physician Orders Details Patient Name: Michael Chang Date of Service: 04/08/2020 2:30 PM Medical Record Number: [id number] Patient Account Number: [id number] Date of Birth/Sex: 1946/04/21 (74 y.o. 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 o Cleanse wound with mild soap and water Primary Wound Dressing Wound #2 Right,Medial Ankle o Other: - Endoform Secondary Dressing Wound #2 Right,Medial Ankle o Boardered Foam Dressing Dressing Change Frequency Wound #2 Right,Medial Ankle o Change Dressing Monday, Wednesday, Friday Follow-up Appointments o Return Appointment in 2 weeks. Edema Control Wound #2 Right,Medial Ankle o Other: - Tubi C Electronic Signature(s) Signed: 04/08/2020 3:59:28 PM By: Worthy Keeler PA-C Signed: 04/13/2020 6:25:04 PM By: Gretta Cool, BSN, RN, CWS, Kim RN, BSN Entered By: Gretta Cool, BSN, RN, CWS, Kim on 04/08/2020 14:54:27 STRYDER, POITRA ([id number]) -------------------------------------------------------------------------------- Problem List Details Patient Name: Michael Chang Date of  Service: 04/08/2020 2:30 PM Medical Record Number: [id number] Patient Account Number: [id number] Date of Birth/Sex: 02/18/1946 (74 y.o. M) Treating RN: Cornell Barman Primary Care Provider: Alma Friendly Other Clinician: Referring Provider: Alma Friendly Treating Provider/Extender: Melburn Hake, Tahjai Schetter Weeks in Treatment: 63 Active Problems ICD-10 Encounter Code Description Active Date MDM Diagnosis I87.2 Venous insufficiency (chronic) (peripheral) 01/07/2020 No Yes I89.0 Lymphedema, not elsewhere classified 01/07/2020 No Yes L97.522 Non-pressure chronic ulcer of other part of left foot with fat layer 01/07/2020 No Yes exposed L97.312 Non-pressure chronic ulcer of right ankle with fat layer exposed 01/07/2020 No Yes I10 Essential (primary) hypertension 01/07/2020 No Yes I25.10 Atherosclerotic heart disease of native coronary artery without angina 01/07/2020 No Yes pectoris Inactive Problems Resolved Problems Electronic Signature(s) Signed: 04/08/2020 2:33:01 PM By: Worthy Keeler PA-C Entered By: Worthy Keeler on 04/08/2020 14:33:00 Michael Chang ([id number]) -------------------------------------------------------------------------------- Progress Note Details Patient Name: Michael Chang Date of Service: 04/08/2020 2:30 PM Medical Record Number: [id number] Patient Account Number: [id number] Date of Birth/Sex: 06-28-1946 (74 y.o. M) Treating RN: Cornell Barman Primary Care Provider: Alma Friendly Other Clinician: Referring Provider: Alma Friendly Treating Provider/Extender: Melburn Hake, Genell Thede Weeks in Treatment: 13 Subjective Chief Complaint Information obtained from Patient Right foot and ankle ulcers History of Present Illness (HPI) 01/07/2020 patient presents today for initial evaluation in our clinic concerning issues that he has been having with ulcers on his bilateral feet. More specifically at his left foot and his right ankle and on the ankle it is medial and lateral. With that being said  these wounds have apparently been present for a significant amount of time in fact the patient states "20 years". When I questioned this and even ask his family member that was with him today  she states that the last time she saw them was around 2006 and they were not this bad. Nonetheless I think he has had this for a significant amount of time although I am unsure of the exact locations of wounds and if some have come and gone. He does have chronic venous insufficiency, lymphedema, hypertension, and has had a heart attack in the recent past few months. Subsequently he just moved from [place] to this area to live with family. His hypertension has been very uncontrolled he does see Dr. [name] and was placed on amlodipine 5 mg. With that being said unfortunately this does not seem according to family to have helped much with his blood pressure currently though to be honest it somewhat hard to know for sure as he had not taken the medicine before he came in today his blood pressure was significantly elevated but apparently it was when he saw his primary on Tuesday as well but then by the time he took his medicine got to the cardiologist this was much lower dropping from around 027 systolic to around 253. Nonetheless my suggestion in this regard is can be for her to keep a track of his blood pressure readings morning noon and night in order to see how this goes throughout the day for the next week and then we will address it following. In regard to the wounds we are going to see what we can do as far as helping with this currently. He also goes to vascular for testing later today. 01/14/2020 upon evaluation today patient's wound actually appears to be doing quite well in general at all locations. He has a lot of new epithelial growth which is great news. Fortunately there is no signs of active infection at this time. He did have his arterial study on the right this was a ABI of 1.44 with a TBI of 1.10 on  the left an ABI of 1.46 with a TBI of 0.93. The good news is he has no signs of arterial insufficiency. The patient also did have blood pressure readings that his family member took over the past week morning and evening. Consistently he was much lower in the evening than he was in the morning. They feel like that his amlodipine probably needs to be increased again I explained that I would give there cardiologist, Dr. [name], a call but that that is not something that I would manage here at the wound care center as far as his blood pressure is concerned. Be more than happy to relay that information to Dr. Tyrell Antonio office however. 01/29/2020 upon evaluation today patient appears to be doing well in general with regard to his wounds. With that being said on the medial aspect he does have some purulent drainage noted which is somewhat reminiscent of potential Pseudomonas although there could be something else going on here as well. Nonetheless I think we do need to obtain a culture also think that antibiotics are probably can be necessary at this point. 02/05/2020 upon evaluation today patient appears to be doing excellent in regard to his wounds. In fact 2 wounds are completely healed as of today just the remaining right medial ankle ulcer is still open but appears to be doing much better there is no evidence of infection today he has been on the antibiotic, Levaquin, that I prescribed for him last week which was appropriate for the infection which showed positive for Morganella and Staphylococcus. 5/21. Still with an extensive area on the right  medial ankle. We have been using silver collagen under Tubigrip 02/26/2020 upon evaluation today patient appears to be doing well in regard to his wounds at this point. In fact he just has 1 remaining currently and this actually showed signs of good epithelial growth. There does not appear to be any evidence of active infection at this time which is excellent news  overall the patient is very pleased with how things seem to be progressing. He is having no pain at this point. 03/03/2020 upon evaluation today patient actually appears to be doing excellent in regard to his leg ulcer. This is showing signs of great improvement and overall very pleased with how things seem to be progressing. Overall he continues to make progress week by week. 03/11/2020 upon evaluation today patient appears to be doing well at this time in regard to his ulcer. He has been tolerating the dressing changes without complication. Fortunately there is no evidence of infection and overall feel like he is progressing quite nicely. 03/25/2020 on evaluation today patient appears to be doing excellent in regard to his wound. He has been tolerating the dressing changes without complication. Fortunately there is no evidence of active infection at this time which is also great news. 04/08/2020 upon evaluation today patient appears to be doing well with regard to his wound is not looking any worse but also unfortunately not making the progress that we really would like to be seen here. Fortunately there is no signs of active infection at this time. No fevers, chills, nausea, vomiting, or diarrhea. AKASHDEEP, CHUBA ([id number]) Objective Constitutional Well-nourished and well-hydrated in no acute distress. Vitals Time Taken: 2:41 PM, Height: 71 in, Weight: 138 lbs, BMI: 19.2, Temperature: 97.9 F, Pulse: 67 bpm, Respiratory Rate: 16 breaths/min, Blood Pressure: 161/82 mmHg. Respiratory normal breathing without difficulty. Psychiatric this patient is able to make decisions and demonstrates good insight into disease process. Alert and Oriented x 3. pleasant and cooperative. General Notes: Patient's wound bed currently showed signs of good granulation at this point. Fortunately there is no evidence of active infection and overall very pleased with where things stand. No fevers, chills, nausea, vomiting,  or diarrhea. Integumentary (Hair, Skin) Wound #2 status is Open. Original cause of wound was Gradually Appeared. The wound is located on the Right,Medial Ankle. The wound measures 3cm length x 2cm width x 0.2cm depth; 4.712cm^2 area and 0.942cm^3 volume. There is Fat Layer (Subcutaneous Tissue) Exposed exposed. There is a medium amount of serous drainage noted. The wound margin is thickened. There is large (67-100%) pink granulation within the wound bed. There is a small (1-33%) amount of necrotic tissue within the wound bed including Adherent Slough. 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 Cleanse wound with mild soap and water Primary Wound Dressing: Wound #2 Right,Medial Ankle: Other: - Endoform Secondary Dressing: Wound #2 Right,Medial Ankle: Boardered Foam Dressing Dressing Change Frequency: Wound #2 Right,Medial Ankle: Change Dressing Monday, Wednesday, Friday Follow-up Appointments: Return Appointment in 2 weeks. Edema Control: Wound #2 Right,Medial Ankle: Other: - Tubi C 1. I would recommend currently that we go ahead and continue with the wound care measures as before with regard to the Tubigrip although we will lower  this to size. 2. I am also can recommend that we go ahead and switch to endoform as opposed to the [facility] to see if this will help make an improvement overall of the symptoms and progression he is somewhat stalled at this point. MARTE, CELANI ([id number]) 3. With regard to the cover dressing we will use a border foam dressing. Also  recommended moistened with saline initially if things seem to not be doing as well as we would like it is somewhat dry and she can only switch to moistening with K-Y jelly. We will see patient back for reevaluation in 2 weeks here in the clinic. If anything worsens or changes patient will contact our office for additional recommendations. Electronic Signature(s) Signed: 04/08/2020 3:00:14 PM By: Worthy Keeler PA-C Entered By: Worthy Keeler on 04/08/2020 15:00:13 Michael Chang ([id number]) -------------------------------------------------------------------------------- SuperBill Details Patient Name: Michael Chang Date of Service: 04/08/2020 Medical Record Number: [id number] Patient Account Number: [id number] Date of Birth/Sex: 17-Jun-1946 (74 y.o. M) Treating RN: Cornell Barman Primary Care Provider: Alma Friendly Other Clinician: Referring Provider: Alma Friendly Treating Provider/Extender: Melburn Hake, Jadwiga Faidley Weeks in Treatment: 13 Diagnosis Coding ICD-10 Codes Code Description I87.2 Venous insufficiency (chronic) (peripheral) I89.0 Lymphedema, not elsewhere classified L97.522 Non-pressure chronic ulcer of other part of left foot with fat layer exposed L97.312 Non-pressure chronic ulcer of right ankle with fat layer exposed [place] (primary) hypertension I25.10 Atherosclerotic heart disease of native coronary artery without angina pectoris Facility Procedures CPT4 Code: [id number] Description: 99213 - WOUND CARE VISIT-LEV 3 EST PT Modifier: Quantity: 1 Physician Procedures CPT4 Code: [id number] Description: 99213 - WC PHYS LEVEL 3 - EST PT Modifier: Quantity: 1 CPT4 Code: Description: ICD-10 Diagnosis Description I87.2 Venous insufficiency (chronic) (peripheral) I89.0 Lymphedema, not elsewhere classified L97.522 Non-pressure chronic ulcer of other part of left foot with fat layer ex L97.312 Non-pressure chronic ulcer of  right ankle with fat layer exposed Modifier:  posed Quantity: Electronic Signature(s) Signed: 04/08/2020 3:01:12 PM By: Worthy Keeler PA-C Entered By: Worthy Keeler on 04/08/2020 15:01:12

## 2020-04-13 NOTE — Progress Notes (Addendum)
NHAT, HEARNE ([id number]) Visit Report for 04/08/2020 Arrival Information Details Patient Name: Michael Chang, Michael Chang Date of Service: 04/08/2020 2:30 PM Medical Record Number: [id number] Patient Account Number: [id number] Date of Birth/Sex: [birth year] (74 y.o. M) Treating RN: Army Melia Primary Care Teya Otterson: Alma Friendly Other Clinician: Referring Yana Schorr: Alma Friendly Treating Kean Gautreau/Extender: Melburn Hake, HOYT Weeks in Treatment: 13 Visit Information History Since Last Visit Added or deleted any medications: No Patient Arrived: Wheel Chair Any new allergies or adverse reactions: No Arrival Time: 14:41 Had a fall or experienced change in No Accompanied By: daughter activities of daily living that may affect Transfer Assistance: None risk of falls: Patient Identification Verified: Yes Signs or symptoms of abuse/neglect since last visito No Hospitalized since last visit: No Has Dressing in Place as Prescribed: Yes Pain Present Now: No Electronic Signature(s) Signed: 04/13/2020 4:08:07 PM By: Army Melia Entered By: Army Melia on 04/08/2020 14:41:21 Michael Chang ([id number]) -------------------------------------------------------------------------------- Clinic Level of Care Assessment Details Patient Name: Michael Chang Date of Service: 04/08/2020 2:30 PM Medical Record Number: [id number] Patient Account Number: [id number] Date of Birth/Sex: 09/12/1946 (74 y.o. M) Treating RN: Cornell Barman Primary Care Indiya Izquierdo: Alma Friendly Other Clinician: Referring Takisha Pelle: Alma Friendly Treating Heraclio Seidman/Extender: Melburn Hake, HOYT Weeks in Treatment: 13 Clinic Level of Care Assessment Items TOOL 4 Quantity Score []  - Use when only an EandM is performed on FOLLOW-UP visit 0 ASSESSMENTS - Nursing Assessment / Reassessment X - Reassessment of Co-morbidities (includes updates in patient status) 1 10 X- 1 5 Reassessment of Adherence to Treatment Plan ASSESSMENTS - Wound and Skin  Assessment / Reassessment X - Simple Wound Assessment / Reassessment - one wound 1 5 []  - 0 Complex Wound Assessment / Reassessment - multiple wounds []  - 0 Dermatologic / Skin Assessment (not related to wound area) ASSESSMENTS - Focused Assessment []  - Circumferential Edema Measurements - multi extremities 0 []  - 0 Nutritional Assessment / Counseling / Intervention []  - 0 Lower Extremity Assessment (monofilament, tuning fork, pulses) []  - 0 Peripheral Arterial Disease Assessment (using hand held doppler) ASSESSMENTS - Ostomy and/or Continence Assessment and Care []  - Incontinence Assessment and Management 0 []  - 0 Ostomy Care Assessment and Management (repouching, etc.) PROCESS - Coordination of Care X - Simple Patient / Family Education for ongoing care 1 15 []  - 0 Complex (extensive) Patient / Family Education for ongoing care []  - 0 Staff obtains Programmer, systems, Records, Test Results / Process Orders []  - 0 Staff telephones HHA, Nursing Homes / Clarify orders / etc []  - 0 Routine Transfer to another Facility (non-emergent condition) []  - 0 Routine Hospital Admission (non-emergent condition) []  - 0 New Admissions / Biomedical engineer / Ordering NPWT, Apligraf, etc. []  - 0 Emergency Hospital Admission (emergent condition) X- 1 10 Simple Discharge Coordination []  - 0 Complex (extensive) Discharge Coordination PROCESS - Special Needs []  - Pediatric / Minor Patient Management 0 []  - 0 Isolation Patient Management []  - 0 Hearing / Language / Visual special needs []  - 0 Assessment of Community assistance (transportation, D/C planning, etc.) []  - 0 Additional assistance / Altered mentation []  - 0 Support Surface(s) Assessment (bed, cushion, seat, etc.) INTERVENTIONS - Wound Cleansing / Measurement Aicher, Jaskarn ([id number]) X- 1 5 Simple Wound Cleansing - one wound []  - 0 Complex Wound Cleansing - multiple wounds X- 1 5 Wound Imaging (photographs - any number of  wounds) []  - 0 Wound Tracing (instead of photographs) X- 1 5 Simple Wound Measurement - one wound []  - 0 Complex Wound Measurement -  multiple wounds INTERVENTIONS - Wound Dressings []  - Small Wound Dressing one or multiple wounds 0 X- 1 15 Medium Wound Dressing one or multiple wounds []  - 0 Large Wound Dressing one or multiple wounds []  - 0 Application of Medications - topical []  - 0 Application of Medications - injection INTERVENTIONS - Miscellaneous []  - External ear exam 0 []  - 0 Specimen Collection (cultures, biopsies, blood, body fluids, etc.) []  - 0 Specimen(s) / Culture(s) sent or taken to Lab for analysis []  - 0 Patient Transfer (multiple staff / Harrel Lemon Lift / Similar devices) []  - 0 Simple Staple / Suture removal (25 or less) []  - 0 Complex Staple / Suture removal (26 or more) []  - 0 Hypo / Hyperglycemic Management (close monitor of Blood Glucose) []  - 0 Ankle / Brachial Index (ABI) - do not check if billed separately X- 1 5 Vital Signs Has the patient been seen at the hospital within the last three years: Yes Total Score: 80 Level Of Care: New/Established - Level 3 Electronic Signature(s) Signed: 04/13/2020 6:25:04 PM By: Gretta Cool, BSN, RN, CWS, Kim RN, BSN Entered By: Gretta Cool, BSN, RN, CWS, Kim on 04/08/2020 14:54:57 Michael Chang ([id number]) -------------------------------------------------------------------------------- Encounter Discharge Information Details Patient Name: Michael Chang Date of Service: 04/08/2020 2:30 PM Medical Record Number: [id number] Patient Account Number: [id number] Date of Birth/Sex: 04-30-1946 (74 y.o. M) Treating RN: Cornell Barman Primary Care Kathlene Yano: Alma Friendly Other Clinician: Referring Thersa Mohiuddin: Alma Friendly Treating Yitzel Shasteen/Extender: Melburn Hake, HOYT Weeks in Treatment: 33 Encounter Discharge Information Items Discharge Condition: Stable Ambulatory Status: Ambulatory Discharge Destination: Home Transportation:  Private Auto Accompanied By: family Schedule Follow-up Appointment: Yes Clinical Summary of Care: Electronic Signature(s) Signed: 04/13/2020 6:25:04 PM By: Gretta Cool, BSN, RN, CWS, Kim RN, BSN Entered By: Gretta Cool, BSN, RN, CWS, Kim on 04/08/2020 14:56:40 Michael Chang ([id number]) -------------------------------------------------------------------------------- Lower Extremity Assessment Details Patient Name: Michael Chang Date of Service: 04/08/2020 2:30 PM Medical Record Number: [id number] Patient Account Number: [id number] Date of Birth/Sex: January 18, 1946 (74 y.o. M) Treating RN: Army Melia Primary Care Kyani Simkin: Alma Friendly Other Clinician: Referring Reana Chacko: Alma Friendly Treating Dacota Devall/Extender: Melburn Hake, HOYT Weeks in Treatment: 13 Edema Assessment Assessed: [Left: No] [Right: No] Edema: [Left: N] [Right: o] Vascular Assessment Pulses: Dorsalis Pedis Palpable: [Right:Yes] Electronic Signature(s) Signed: 04/13/2020 4:08:07 PM By: Army Melia Entered By: Army Melia on 04/08/2020 14:46:01 Michael Chang ([id number]) -------------------------------------------------------------------------------- Multi Wound Chart Details Patient Name: Michael Chang Date of Service: 04/08/2020 2:30 PM Medical Record Number: [id number] Patient Account Number: [id number] Date of Birth/Sex: 03/26/1946 (74 y.o. M) Treating RN: Cornell Barman Primary Care Daisean Brodhead: Alma Friendly Other Clinician: Referring Asalee Barrette: Alma Friendly Treating Blair Lundeen/Extender: Melburn Hake, HOYT Weeks in Treatment: 13 Vital Signs Height(in): 71 Pulse(bpm): 47 Weight(lbs): 138 Blood Pressure(mmHg): 161/82 Body Mass Index(BMI): 19 Temperature(F): 97.9 Respiratory Rate(breaths/min): 16 Photos: [N/A:N/A] Wound Location: Right, Medial Ankle N/A N/A Wounding Event: Gradually Appeared N/A N/A Primary Etiology: Venous Leg Ulcer N/A N/A Comorbid History: Hypertension, Myocardial Infarction, N/A N/A Peripheral  Venous Disease, End Stage Renal Disease Date Acquired: 01/07/2019 N/A N/A Weeks of Treatment: 13 N/A N/A Wound Status: Open N/A N/A Measurements L x W x D (cm) 3x2x0.2 N/A N/A Area (cm) : 4.712 N/A N/A Volume (cm) : 0.942 N/A N/A % Reduction in Area: 85.60% N/A N/A % Reduction in Volume: 92.80% N/A N/A Classification: Full Thickness Without Exposed N/A N/A Support Structures Exudate Amount: Medium N/A N/A Exudate Type: Serous N/A N/A Exudate Color: amber N/A N/A Wound Margin: Thickened N/A N/A Granulation Amount: Large (67-100%)  N/A N/A Granulation Quality: Pink N/A N/A Necrotic Amount: Small (1-33%) N/A N/A Exposed Structures: Fat Layer (Subcutaneous Tissue) N/A N/A Exposed: Yes Fascia: No Tendon: No Muscle: No Joint: No Bone: No Epithelialization: None N/A N/A Treatment Notes Electronic Signature(s) Signed: 04/13/2020 6:25:04 PM By: Gretta Cool, BSN, RN, CWS, Kim RN, BSN Entered By: Gretta Cool, BSN, RN, CWS, Kim on 04/08/2020 14:52:21 ANTHONIO, MIZZELL ([id number]) -------------------------------------------------------------------------------- Multi-Disciplinary Care Plan Details Patient Name: Michael Chang Date of Service: 04/08/2020 2:30 PM Medical Record Number: [id number] Patient Account Number: [id number] Date of Birth/Sex: 12/08/1945 (74 y.o. M) Treating RN: Cornell Barman Primary Care Travonte Byard: Alma Friendly Other Clinician: Referring Wyland Rastetter: Alma Friendly Treating Rochel Privett/Extender: Sharalyn Ink in Treatment: 59 Active Inactive Electronic Signature(s) Signed: 05/03/2020 11:55:53 AM By: Gretta Cool, BSN, RN, CWS, Kim RN, BSN Previous Signature: 04/13/2020 6:25:04 PM Version By: Gretta Cool, BSN, RN, CWS, Kim RN, BSN Entered By: Gretta Cool, BSN, RN, CWS, Kim on 05/03/2020 11:55:52 Michael Chang ([id number]) -------------------------------------------------------------------------------- Pain Assessment Details Patient Name: Michael Chang Date of Service: 04/08/2020 2:30  PM Medical Record Number: [id number] Patient Account Number: [id number] Date of Birth/Sex: [birth year] (74 y.o. M) Treating RN: Army Melia Primary Care Brithney Bensen: Alma Friendly Other Clinician: Referring Titan Karner: Alma Friendly Treating Toinette Lackie/Extender: Melburn Hake, HOYT Weeks in Treatment: 13 Active Problems Location of Pain Severity and Description of Pain Patient Has Paino No Site Locations Pain Management and Medication Current Pain Management: Electronic Signature(s) Signed: 04/13/2020 4:08:07 PM By: Army Melia Entered By: Army Melia on 04/08/2020 14:41:50 Michael Chang ([id number]) -------------------------------------------------------------------------------- Patient/Caregiver Education Details Patient Name: Michael Chang Date of Service: 04/08/2020 2:30 PM Medical Record Number: [id number] Patient Account Number: [id number] Date of Birth/Gender: [birth year] (74 y.o. M) Treating RN: Cornell Barman Primary Care Physician: Alma Friendly Other Clinician: Referring Physician: Alma Friendly Treating Physician/Extender: Sharalyn Ink in Treatment: 13 Education Assessment Education Provided To: Patient Education Topics Provided Wound/Skin Impairment: Handouts: Caring for Your Ulcer Methods: Demonstration, Explain/Verbal Responses: State content correctly Electronic Signature(s) Signed: 04/13/2020 6:25:04 PM By: Gretta Cool, BSN, RN, CWS, Kim RN, BSN Entered By: Gretta Cool, BSN, RN, CWS, Kim on 04/08/2020 14:55:13 Michael Chang ([id number]) -------------------------------------------------------------------------------- Wound Assessment Details Patient Name: Michael Chang Date of Service: 04/08/2020 2:30 PM Medical Record Number: [id number] Patient Account Number: [id number] Date of Birth/Sex: January 18, 1946 (74 y.o. M) Treating RN: Army Melia Primary Care Bayla Mcgovern: Alma Friendly Other Clinician: Referring Varie Machamer: Alma Friendly Treating Rashmi Tallent/Extender: Melburn Hake, HOYT Weeks in Treatment: 13 Wound Status Wound Number: 2 Primary Venous Leg Ulcer Etiology: Wound Location: Right, Medial Ankle Wound Open Wounding Event: Gradually Appeared Status: Date Acquired: 01/07/2019 Comorbid Hypertension, Myocardial Infarction, Peripheral Venous Weeks Of Treatment: 13 History: Disease, End Stage Renal Disease Clustered Wound: No Photos Wound Measurements Length: (cm) 3 Width: (cm) 2 Depth: (cm) 0.2 Area: (cm) 4.712 Volume: (cm) 0.942 % Reduction in Area: 85.6% % Reduction in Volume: 92.8% Epithelialization: None Wound Description Classification: Full Thickness Without Exposed Support Structu Wound Margin: Thickened Exudate Amount: Medium Exudate Type: Serous Exudate Color: amber res Foul Odor After Cleansing: No Slough/Fibrino Yes Wound Bed Granulation Amount: Large (67-100%) Exposed Structure Granulation Quality: Pink Fascia Exposed: No Necrotic Amount: Small (1-33%) Fat Layer (Subcutaneous Tissue) Exposed: Yes Necrotic Quality: Adherent Slough Tendon Exposed: No Muscle Exposed: No Joint Exposed: No Bone Exposed: No Electronic Signature(s) Signed: 04/13/2020 4:08:07 PM By: Army Melia Signed: 04/13/2020 6:25:04 PM By: Gretta Cool, BSN, RN, CWS, Kim RN, BSN Entered By: Gretta Cool, BSN, RN, CWS, Kim on 04/08/2020 14:57:17 Michael Chang ([id number]) -------------------------------------------------------------------------------- Vitals Details Patient Name: Michael Chang  Date of Service: 04/08/2020 2:30 PM Medical Record Number: [id number] Patient Account Number: [id number] Date of Birth/Sex: Feb 07, 1946 (74 y.o. M) Treating RN: Army Melia Primary Care Camran Keady: Alma Friendly Other Clinician: Referring Dary Dilauro: Alma Friendly Treating Murry Diaz/Extender: Melburn Hake, HOYT Weeks in Treatment: 13 Vital Signs Time Taken: 14:41 Temperature (F): 97.9 Height (in): 71 Pulse (bpm): 67 Weight (lbs): 138 Respiratory Rate (breaths/min):  16 Body Mass Index (BMI): 19.2 Blood Pressure (mmHg): 161/82 Reference Range: 80 - 120 mg / dl Electronic Signature(s) Signed: 04/13/2020 4:08:07 PM By: Army Melia Entered By: Army Melia on 04/08/2020 14:41:38

## 2020-04-18 ENCOUNTER — Ambulatory Visit: Payer: Medicare Other | Admitting: Physician Assistant

## 2020-04-25 ENCOUNTER — Other Ambulatory Visit: Payer: Self-pay | Admitting: Primary Care

## 2020-04-25 ENCOUNTER — Telehealth: Payer: Self-pay | Admitting: Physician Assistant

## 2020-04-25 ENCOUNTER — Ambulatory Visit: Payer: Medicare Other | Admitting: Physician Assistant

## 2020-04-25 NOTE — Telephone Encounter (Signed)
Patients sister calling in stating that Michael Chang prescription is inconsistent. Patients sister believes that he should be taking 90 mgs per day of isosorbide (2 30 mg in the am and 30mg  in pm). Patient states Michael Chang is wrong in having 2 tablets in am and 1/2 in pm  Patients sister advised we do not have her on a DPR and that we will speak with the provider and call the patient to discuss  Please advise

## 2020-04-26 NOTE — Telephone Encounter (Signed)
Spoke with patient. Reports he had told his sister, Michael Chang, he was supposed to be taking IMdur 60 mg in the am and 15 mg in the pm. She thought it was Imdur 60 mg in the am and 30 in the pm. Patient said he is aware and will go back to taking it as stated by Thurmond Butts. Patient says he does not need a refill at this time.

## 2020-04-26 NOTE — Telephone Encounter (Signed)
I see where the confusion is coming into play.  At his last visit we wanted to add a little more antihypertensive in the afternoons to help offset some of his BP spikes.  With this, we agreed to adjust his Imdur with the recommendation for him to take Imdur 60 mg in the morning and 15 mg in the afternoon with plans to possibly increase this afternoon dose to 30 mg though we wanted to move slowly given his labile blood pressure.  I believe the confusion has come into play with there being Imdur on his medication list twice.  Please update his Imdur as follows: Imdur 60 mg every morning and 15 mg every afternoon It would be easier to send in a 30 mg tabs and have him take 2 tabs in the morning and half tab in the afternoon

## 2020-05-19 ENCOUNTER — Encounter: Payer: Self-pay | Admitting: Primary Care

## 2020-05-19 ENCOUNTER — Telehealth: Payer: Self-pay | Admitting: Primary Care

## 2020-05-19 NOTE — Telephone Encounter (Signed)
Letter completed and ready for pick up. I already notified patient's sister.

## 2020-05-19 NOTE — Telephone Encounter (Signed)
Patients sister came in office stating that patient needs a letter with our letterhead on it, patients name and birthday so that  Mr.Piccione is able to get a social security card because he doesn't have a DL or any other form of ID. Please contact patient weather we can provide a letter or not.[name]

## 2020-06-07 ENCOUNTER — Encounter (INDEPENDENT_AMBULATORY_CARE_PROVIDER_SITE_OTHER): Payer: Medicare Other | Admitting: Ophthalmology

## 2020-06-15 ENCOUNTER — Encounter (INDEPENDENT_AMBULATORY_CARE_PROVIDER_SITE_OTHER): Payer: Medicare Other | Admitting: Ophthalmology

## 2020-07-01 DIAGNOSIS — H209 Unspecified iridocyclitis: Secondary | ICD-10-CM | POA: Diagnosis not present

## 2020-07-01 DIAGNOSIS — H01001 Unspecified blepharitis right upper eyelid: Secondary | ICD-10-CM | POA: Diagnosis not present

## 2020-07-01 DIAGNOSIS — H01002 Unspecified blepharitis right lower eyelid: Secondary | ICD-10-CM | POA: Diagnosis not present

## 2020-07-01 DIAGNOSIS — H53453 Other localized visual field defect, bilateral: Secondary | ICD-10-CM | POA: Diagnosis not present

## 2020-07-01 DIAGNOSIS — H26493 Other secondary cataract, bilateral: Secondary | ICD-10-CM | POA: Diagnosis not present

## 2020-07-08 DIAGNOSIS — H01001 Unspecified blepharitis right upper eyelid: Secondary | ICD-10-CM | POA: Diagnosis not present

## 2020-07-08 DIAGNOSIS — H209 Unspecified iridocyclitis: Secondary | ICD-10-CM | POA: Diagnosis not present

## 2020-07-08 DIAGNOSIS — H26493 Other secondary cataract, bilateral: Secondary | ICD-10-CM | POA: Diagnosis not present

## 2020-07-08 DIAGNOSIS — H53453 Other localized visual field defect, bilateral: Secondary | ICD-10-CM | POA: Diagnosis not present

## 2020-07-08 DIAGNOSIS — R6889 Other general symptoms and signs: Secondary | ICD-10-CM | POA: Diagnosis not present

## 2020-07-08 DIAGNOSIS — H01002 Unspecified blepharitis right lower eyelid: Secondary | ICD-10-CM | POA: Diagnosis not present

## 2020-07-20 ENCOUNTER — Other Ambulatory Visit: Payer: Self-pay | Admitting: Cardiovascular Disease

## 2020-07-20 NOTE — Telephone Encounter (Signed)
Rx request sent to pharmacy.  

## 2020-08-23 ENCOUNTER — Telehealth: Payer: Self-pay | Admitting: Primary Care

## 2020-08-23 DIAGNOSIS — H4423 Degenerative myopia, bilateral: Secondary | ICD-10-CM | POA: Diagnosis not present

## 2020-08-23 DIAGNOSIS — H18413 Arcus senilis, bilateral: Secondary | ICD-10-CM | POA: Diagnosis not present

## 2020-08-23 DIAGNOSIS — M9901 Segmental and somatic dysfunction of cervical region: Secondary | ICD-10-CM | POA: Diagnosis not present

## 2020-08-23 DIAGNOSIS — M5136 Other intervertebral disc degeneration, lumbar region: Secondary | ICD-10-CM | POA: Diagnosis not present

## 2020-08-23 DIAGNOSIS — H02834 Dermatochalasis of left upper eyelid: Secondary | ICD-10-CM | POA: Diagnosis not present

## 2020-08-23 DIAGNOSIS — M50323 Other cervical disc degeneration at C6-C7 level: Secondary | ICD-10-CM | POA: Diagnosis not present

## 2020-08-23 DIAGNOSIS — Z961 Presence of intraocular lens: Secondary | ICD-10-CM | POA: Diagnosis not present

## 2020-08-23 DIAGNOSIS — H02831 Dermatochalasis of right upper eyelid: Secondary | ICD-10-CM | POA: Diagnosis not present

## 2020-08-23 DIAGNOSIS — M9903 Segmental and somatic dysfunction of lumbar region: Secondary | ICD-10-CM | POA: Diagnosis not present

## 2020-08-23 NOTE — Telephone Encounter (Signed)
Called this number it is outgoing number only did you get another number for contact?

## 2020-08-23 NOTE — Telephone Encounter (Signed)
deza called in from [employer] wanted to know about getting a x-ray order for [facility], he got it done today but in order for the x-ray to be covered under the insurance it has to come from the PCP.   Please advise

## 2020-08-30 DIAGNOSIS — M9903 Segmental and somatic dysfunction of lumbar region: Secondary | ICD-10-CM | POA: Diagnosis not present

## 2020-08-30 DIAGNOSIS — M9901 Segmental and somatic dysfunction of cervical region: Secondary | ICD-10-CM | POA: Diagnosis not present

## 2020-08-30 DIAGNOSIS — M5136 Other intervertebral disc degeneration, lumbar region: Secondary | ICD-10-CM | POA: Diagnosis not present

## 2020-08-30 DIAGNOSIS — M50323 Other cervical disc degeneration at C6-C7 level: Secondary | ICD-10-CM | POA: Diagnosis not present

## 2020-09-01 ENCOUNTER — Ambulatory Visit (INDEPENDENT_AMBULATORY_CARE_PROVIDER_SITE_OTHER): Payer: Medicare Other | Admitting: Vascular Surgery

## 2020-09-01 ENCOUNTER — Encounter (INDEPENDENT_AMBULATORY_CARE_PROVIDER_SITE_OTHER): Payer: Medicare Other

## 2020-09-05 DIAGNOSIS — M9901 Segmental and somatic dysfunction of cervical region: Secondary | ICD-10-CM | POA: Diagnosis not present

## 2020-09-05 DIAGNOSIS — M9903 Segmental and somatic dysfunction of lumbar region: Secondary | ICD-10-CM | POA: Diagnosis not present

## 2020-09-05 DIAGNOSIS — M5136 Other intervertebral disc degeneration, lumbar region: Secondary | ICD-10-CM | POA: Diagnosis not present

## 2020-09-05 DIAGNOSIS — M50323 Other cervical disc degeneration at C6-C7 level: Secondary | ICD-10-CM | POA: Diagnosis not present

## 2020-09-12 ENCOUNTER — Encounter (INDEPENDENT_AMBULATORY_CARE_PROVIDER_SITE_OTHER): Payer: Medicare Other

## 2020-09-12 ENCOUNTER — Ambulatory Visit (INDEPENDENT_AMBULATORY_CARE_PROVIDER_SITE_OTHER): Payer: Medicare Other | Admitting: Vascular Surgery

## 2020-09-12 DIAGNOSIS — M9901 Segmental and somatic dysfunction of cervical region: Secondary | ICD-10-CM | POA: Diagnosis not present

## 2020-09-12 DIAGNOSIS — M50323 Other cervical disc degeneration at C6-C7 level: Secondary | ICD-10-CM | POA: Diagnosis not present

## 2020-09-12 DIAGNOSIS — M9903 Segmental and somatic dysfunction of lumbar region: Secondary | ICD-10-CM | POA: Diagnosis not present

## 2020-09-12 DIAGNOSIS — M5136 Other intervertebral disc degeneration, lumbar region: Secondary | ICD-10-CM | POA: Diagnosis not present

## 2020-09-13 ENCOUNTER — Ambulatory Visit: Payer: Medicare Other | Admitting: Cardiovascular Disease

## 2020-09-13 ENCOUNTER — Other Ambulatory Visit: Payer: Self-pay

## 2020-09-13 VITALS — BP 140/82 | HR 71 | Ht 73.5 in | Wt 148.0 lb

## 2020-09-13 DIAGNOSIS — I10 Essential (primary) hypertension: Secondary | ICD-10-CM | POA: Diagnosis not present

## 2020-09-13 DIAGNOSIS — I5032 Chronic diastolic (congestive) heart failure: Secondary | ICD-10-CM

## 2020-09-13 NOTE — Patient Instructions (Signed)
Medication Instructions:  Your physician recommends that you continue on your current medications as directed. Please refer to the Current Medication list given to you today.  *If you need a refill on your cardiac medications before your next appointment, please call your pharmacy*   Lab Work: None Ordered If you have labs (blood work) drawn today and your tests are completely normal, you will receive your results only by:  [place] (if you have MyChart) OR  A paper copy in the mail If you have any lab test that is abnormal or we need to change your treatment, we will call you to review the results.   Testing/Procedures: None Ordered   Follow-Up: At [facility], you and your health needs are our priority.  As part of our continuing mission to provide you with exceptional heart care, we have created designated Provider Care Teams.  These Care Teams include your primary Cardiologist (physician) and Advanced Practice Providers (APPs -  Physician Assistants and Nurse Practitioners) who all work together to provide you with the care you need, when you need it.  We recommend signing up for the patient portal called "MyChart".  Sign up information is provided on this After Visit Summary.  MyChart is used to connect with patients for Virtual Visits (Telemedicine).  Patients are able to view lab/test results, encounter notes, upcoming appointments, etc.  Non-urgent messages can be sent to your provider as well.   To learn more about what you can do with MyChart, go to [email or web address].    Your next appointment:   6 month(s)  The format for your next appointment:   In Person  Provider:   You may see [name], MD or one of the following Advanced Practice Providers on your designated Care Team:    Murray Hodgkins, NP  Christell Faith, PA-C  Marrianne Mood, PA-C  Cadence Stuart, [place]  Laurann Montana, NP    Other Instructions

## 2020-09-13 NOTE — Progress Notes (Signed)
Cardiology Office Note   Date:  09/13/2020   ID:  Michael Chang, DOB 1946/06/28, MRN [id number]  PCP:  [name], NP  Cardiologist:   [name], MD   Chief Complaint  Patient presents with  . Other    Past due one month follow up. Meds reviewed verbally with patient.       History of Present Illness: Michael Chang is a 74 y.o. male who is here today for a follow-up visit regarding hypertensive heart disease and possible underlying coronary artery disease.   He is nearly blind and moved to [place] to be closer to his sister.  He also has history of peripheral arterial disease with lower extremity ulceration but ABI in March was normal. The patient did not seek medical attention for many years at least since 2007 and was living in a poor living condition by himself. He was diagnosed with heart failure in [place] and March 2021 with an EF of 40 to 45%.  He did have non-ST elevation myocardial infarction but was treated medically.  He does have chronic kidney disease with creatinine of 2.77.  His initial presentation was with a possible syncopal episode.  He was noted to be anemic and underwent an EGD which showed moderately erythematous stomach mucosa.  Colonoscopy showed internal hemorrhoids.  It was not clear if he had acute kidney injury or whether he had chronic kidney disease. He is not a smoker and quit alcohol 12 years ago.  He is not diabetic.  He is retired and used to work in Tourist information centre manager.   He was hospitalized at [facility] in May after a fall.  Echo showed an EF of 55 to 60% with severe LVH.  Troponin was mildly elevated. He has been doing well overall with no recent chest pain, shortness of breath or palpitations.  He takes his medications regularly.  He lives close to his sister.  His vision has improved but still significantly impaired.  He does not drive.   Past Medical History:  Diagnosis Date  . Anemia   . CAD (coronary artery disease)   . Chronic  combined systolic (congestive) and diastolic (congestive) heart failure ([place])   . Chronic ulcer of right lower extremity ([place])   . CKD (chronic kidney disease), stage III   . Essential hypertension   . HLD (hyperlipidemia)   . NSTEMI (non-ST elevated myocardial infarction) ([place])   . PVD (peripheral vascular disease) ([place])     No past surgical history on file.   Current Outpatient Medications  Medication Sig Dispense Refill  . amLODipine (NORVASC) 10 MG tablet Take 10 mg by mouth daily.    [name] aspirin 81 MG EC tablet Take by mouth.    . carvedilol (COREG) 12.5 MG tablet TAKE 1 TABLET BY MOUTH TWICE DAILY WITH MEALS 180 tablet 0  . hydrALAZINE (APRESOLINE) 100 MG tablet Take 1 tablet by mouth twice daily 180 tablet 0  . isosorbide mononitrate (IMDUR) 30 MG 24 hr tablet Take 2 tablets by mouth in the AM, and take 1/2 tablet by mouth in the PM. 75 tablet 6  . Multiple Vitamin (MULTIVITAMIN WITH MINERALS) TABS tablet Take 1 tablet by mouth daily.    . Multiple Vitamins-Minerals (CENTRUM SILVER 50+MEN) TABS Take by mouth.     No current facility-administered medications for this visit.    Allergies:   Patient has no known allergies.    Social History:  The patient  reports that he has never smoked.  He has never used smokeless tobacco. He reports previous alcohol use.   Family History:  The patient's family history is negative for coronary artery disease.  There is family history of hypertension.   ROS:  Please see the history of present illness.   Otherwise, review of systems are positive for none.   All other systems are reviewed and negative.    PHYSICAL EXAM: VS:  BP 140/82 (BP Location: Right Arm, Patient Position: Sitting, Cuff Size: Normal)   Pulse 71   Ht 6' 1.5" (1.867 m)   Wt 148 lb (67.1 kg)   SpO2 98%   BMI 19.26 kg/m  , BMI Body mass index is 19.26 kg/m. GEN: Well nourished, well developed, in no acute distress  HEENT: normal  Neck: no JVD, carotid bruits, or  masses Cardiac: RRR; no murmurs, rubs, or gallops,no edema  Respiratory:  clear to auscultation bilaterally, normal work of breathing GI: soft, nontender, nondistended, + BS MS: no deformity or atrophy  Skin: warm and dry, no rash Neuro:  Strength and sensation are intact Psych: euthymic mood, full affect   EKG:  EKG is ordered today. The ekg ordered today demonstrates normal sinus rhythm with no significant ST or T wave changes.   Recent Labs: 01/05/2020: TSH 4.17 02/14/2020: ALT 16 02/15/2020: BUN 43; Creatinine, Ser 2.06; Potassium 4.2; Sodium 140 02/18/2020: Hemoglobin 9.2; Platelets 278.0    Lipid Panel    Component Value Date/Time   CHOL 133 01/05/2020 1028   TRIG 62.0 01/05/2020 1028   HDL 43.70 01/05/2020 1028   CHOLHDL 3 01/05/2020 1028   VLDL 12.4 01/05/2020 1028   LDLCALC 77 01/05/2020 1028      Wt Readings from Last 3 Encounters:  09/13/20 148 lb (67.1 kg)  03/09/20 144 lb 6 oz (65.5 kg)  03/03/20 143 lb (64.9 kg)      Other studies Reviewed: Additional studies/ records that were reviewed today include: Reviewed records from his hospitalization. Review of the above records demonstrates: Summarized above  No flowsheet data found.    ASSESSMENT AND PLAN:  1.  Chronic diastolic heart failure with hypertensive heart disease: Most recent echocardiogram showed normal LV systolic function.  He appears to be euvolemic without any diuretics.  Continue blood pressure control.   2.  Essential hypertension: Blood pressure is much better controlled after adjusting his medications.  Continue amlodipine, carvedilol, hydralazine and Imdur.  He has advanced chronic kidney disease and thus no ACE inhibitor or ARB.  3.   Chronic kidney disease: This has been stable with most recent creatinine around 2.    Disposition:   FU with me in 6 months  Signed,  [name], MD  09/13/2020 3:39 PM    [place]

## 2020-09-19 ENCOUNTER — Other Ambulatory Visit: Payer: Self-pay | Admitting: *Deleted

## 2020-09-19 MED ORDER — ISOSORBIDE MONONITRATE ER 30 MG PO TB24: ORAL_TABLET | ORAL | 1 refills | Status: DC

## 2020-09-21 ENCOUNTER — Encounter (HOSPITAL_BASED_OUTPATIENT_CLINIC_OR_DEPARTMENT_OTHER): Payer: Medicare Other | Admitting: Physician Assistant

## 2020-09-21 DIAGNOSIS — M5136 Other intervertebral disc degeneration, lumbar region: Secondary | ICD-10-CM | POA: Diagnosis not present

## 2020-09-21 DIAGNOSIS — M50323 Other cervical disc degeneration at C6-C7 level: Secondary | ICD-10-CM | POA: Diagnosis not present

## 2020-09-21 DIAGNOSIS — M9903 Segmental and somatic dysfunction of lumbar region: Secondary | ICD-10-CM | POA: Diagnosis not present

## 2020-09-21 DIAGNOSIS — M9901 Segmental and somatic dysfunction of cervical region: Secondary | ICD-10-CM | POA: Diagnosis not present

## 2020-09-26 DIAGNOSIS — M9901 Segmental and somatic dysfunction of cervical region: Secondary | ICD-10-CM | POA: Diagnosis not present

## 2020-09-26 DIAGNOSIS — M5136 Other intervertebral disc degeneration, lumbar region: Secondary | ICD-10-CM | POA: Diagnosis not present

## 2020-09-26 DIAGNOSIS — M50323 Other cervical disc degeneration at C6-C7 level: Secondary | ICD-10-CM | POA: Diagnosis not present

## 2020-09-26 DIAGNOSIS — M9903 Segmental and somatic dysfunction of lumbar region: Secondary | ICD-10-CM | POA: Diagnosis not present

## 2020-10-10 DIAGNOSIS — M9903 Segmental and somatic dysfunction of lumbar region: Secondary | ICD-10-CM | POA: Diagnosis not present

## 2020-10-10 DIAGNOSIS — M9901 Segmental and somatic dysfunction of cervical region: Secondary | ICD-10-CM | POA: Diagnosis not present

## 2020-10-10 DIAGNOSIS — M5136 Other intervertebral disc degeneration, lumbar region: Secondary | ICD-10-CM | POA: Diagnosis not present

## 2020-10-10 DIAGNOSIS — M50323 Other cervical disc degeneration at C6-C7 level: Secondary | ICD-10-CM | POA: Diagnosis not present

## 2020-10-14 ENCOUNTER — Other Ambulatory Visit: Payer: Self-pay

## 2020-10-14 MED ORDER — HYDRALAZINE HCL 100 MG PO TABS: 100.0000 mg | ORAL_TABLET | Freq: Two times a day (BID) | ORAL | 1 refills | Status: DC

## 2020-10-17 ENCOUNTER — Encounter (HOSPITAL_BASED_OUTPATIENT_CLINIC_OR_DEPARTMENT_OTHER): Payer: Medicare Other | Admitting: Internal Medicine

## 2020-10-18 ENCOUNTER — Encounter (HOSPITAL_BASED_OUTPATIENT_CLINIC_OR_DEPARTMENT_OTHER): Payer: Medicare Other | Admitting: Internal Medicine

## 2020-10-18 ENCOUNTER — Telehealth: Payer: Self-pay | Admitting: Cardiovascular Disease

## 2020-10-18 MED ORDER — CARVEDILOL 12.5 MG PO TABS: 12.5000 mg | ORAL_TABLET | Freq: Two times a day (BID) | ORAL | 1 refills | Status: DC

## 2020-10-18 NOTE — Telephone Encounter (Signed)
Rx request sent to pharmacy.  

## 2020-10-18 NOTE — Telephone Encounter (Signed)
*  STAT* If patient is at the pharmacy, call can be transferred to refill team.   1. Which medications need to be refilled? (please list name of each medication and dose if known) carvedilol 12.5 MG   2. Which pharmacy/location (including street and city if local pharmacy) is medication to be sent to? [facility] - pharmacy says it needs to be expedited and called into [phone] today   3. Do they need a 30 day or 90 day supply? 90 day

## 2020-10-24 DIAGNOSIS — M50323 Other cervical disc degeneration at C6-C7 level: Secondary | ICD-10-CM | POA: Diagnosis not present

## 2020-10-24 DIAGNOSIS — M5136 Other intervertebral disc degeneration, lumbar region: Secondary | ICD-10-CM | POA: Diagnosis not present

## 2020-10-24 DIAGNOSIS — M9901 Segmental and somatic dysfunction of cervical region: Secondary | ICD-10-CM | POA: Diagnosis not present

## 2020-10-24 DIAGNOSIS — M9903 Segmental and somatic dysfunction of lumbar region: Secondary | ICD-10-CM | POA: Diagnosis not present

## 2021-01-31 ENCOUNTER — Encounter: Payer: Medicare Other | Admitting: Primary Care

## 2021-02-10 ENCOUNTER — Other Ambulatory Visit: Payer: Self-pay

## 2021-02-10 ENCOUNTER — Ambulatory Visit (INDEPENDENT_AMBULATORY_CARE_PROVIDER_SITE_OTHER): Payer: Medicare Other | Admitting: Primary Care

## 2021-02-10 ENCOUNTER — Encounter: Payer: Self-pay | Admitting: Primary Care

## 2021-02-10 VITALS — BP 144/85 | HR 61 | Temp 97.6°F | Ht 73.5 in | Wt 155.0 lb

## 2021-02-10 DIAGNOSIS — I10 Essential (primary) hypertension: Secondary | ICD-10-CM

## 2021-02-10 DIAGNOSIS — R7989 Other specified abnormal findings of blood chemistry: Secondary | ICD-10-CM

## 2021-02-10 DIAGNOSIS — I2510 Atherosclerotic heart disease of native coronary artery without angina pectoris: Secondary | ICD-10-CM

## 2021-02-10 DIAGNOSIS — N183 Chronic kidney disease, stage 3 unspecified: Secondary | ICD-10-CM

## 2021-02-10 DIAGNOSIS — D509 Iron deficiency anemia, unspecified: Secondary | ICD-10-CM

## 2021-02-10 DIAGNOSIS — Z125 Encounter for screening for malignant neoplasm of prostate: Secondary | ICD-10-CM | POA: Diagnosis not present

## 2021-02-10 DIAGNOSIS — L97912 Non-pressure chronic ulcer of unspecified part of right lower leg with fat layer exposed: Secondary | ICD-10-CM

## 2021-02-10 DIAGNOSIS — I739 Peripheral vascular disease, unspecified: Secondary | ICD-10-CM

## 2021-02-10 DIAGNOSIS — Z0000 Encounter for general adult medical examination without abnormal findings: Secondary | ICD-10-CM | POA: Insufficient documentation

## 2021-02-10 DIAGNOSIS — I5022 Chronic systolic (congestive) heart failure: Secondary | ICD-10-CM | POA: Diagnosis not present

## 2021-02-10 LAB — CBC
HCT: 38.1 % — ABNORMAL LOW (ref 39.0–52.0)
Hemoglobin: 12.6 g/dL — ABNORMAL LOW (ref 13.0–17.0)
MCHC: 33.2 g/dL (ref 30.0–36.0)
MCV: 90.7 fl (ref 78.0–100.0)
Platelets: 224 10*3/uL (ref 150.0–400.0)
RBC: 4.2 Mil/uL — ABNORMAL LOW (ref 4.22–5.81)
RDW: 14.5 % (ref 11.5–15.5)
WBC: 4.3 10*3/uL (ref 4.0–10.5)

## 2021-02-10 LAB — TSH: TSH: 1.79 u[IU]/mL (ref 0.35–4.50)

## 2021-02-10 LAB — COMPREHENSIVE METABOLIC PANEL
ALT: 20 U/L (ref 0–53)
AST: 23 U/L (ref 0–37)
Albumin: 4.1 g/dL (ref 3.5–5.2)
Alkaline Phosphatase: 62 U/L (ref 39–117)
BUN: 19 mg/dL (ref 6–23)
CO2: 28 mEq/L (ref 19–32)
Calcium: 9.5 mg/dL (ref 8.4–10.5)
Chloride: 106 mEq/L (ref 96–112)
Creatinine, Ser: 1.94 mg/dL — ABNORMAL HIGH (ref 0.40–1.50)
GFR: 33.39 mL/min — ABNORMAL LOW (ref >60.00)
Glucose, Bld: 92 mg/dL (ref 70–99)
Potassium: 4.5 mEq/L (ref 3.5–5.1)
Sodium: 143 mEq/L (ref 135–145)
Total Bilirubin: 0.5 mg/dL (ref 0.2–1.2)
Total Protein: 8.2 g/dL (ref 6.0–8.3)

## 2021-02-10 LAB — LIPID PANEL
Cholesterol: 149 mg/dL (ref 0–200)
HDL: 43.5 mg/dL (ref >39.00)
LDL Cholesterol: 90 mg/dL (ref 0–99)
NonHDL: 105.4
Total CHOL/HDL Ratio: 3
Triglycerides: 79 mg/dL (ref 0.0–149.0)
VLDL: 15.8 mg/dL (ref 0.0–40.0)

## 2021-02-10 LAB — PSA, MEDICARE: PSA: 3.46 ng/ml (ref 0.10–4.00)

## 2021-02-10 NOTE — Assessment & Plan Note (Signed)
No longer seeing wound clinic, doesn't appear to be following with vascular services.  He refused for me to evaluate his ulcer today.

## 2021-02-10 NOTE — Assessment & Plan Note (Signed)
Repeat CBC pending. 

## 2021-02-10 NOTE — Assessment & Plan Note (Signed)
Decently controlled in the office today. Continue hydralazine 100 mg BID, carvedilol 12.5 mg BID, amlodipine 10 mg, and Imdur 60 mg in AM and 15 mg in PM.  Labs pending.

## 2021-02-10 NOTE — Patient Instructions (Signed)
Stop by the lab prior to leaving today. I will notify you of your results once received.   Please follow up with the heart doctor and kidney doctor.  I recommend you see the wound doctor again, let me know if you change your mind.  It was a pleasure to see you today!   Preventive Care 75 Years and Older, Male Preventive care refers to lifestyle choices and visits with your health care provider that can promote health and wellness. This includes:  A yearly physical exam. This is also called an annual wellness visit.  Regular dental and eye exams.  Immunizations.  Screening for certain conditions.  Healthy lifestyle choices, such as: ? Eating a healthy diet. ? Getting regular exercise. ? Not using drugs or products that contain nicotine and tobacco. ? Limiting alcohol use. What can I expect for my preventive care visit? Physical exam Your health care provider will check your:  Height and weight. These may be used to calculate your BMI (body mass index). BMI is a measurement that tells if you are at a healthy weight.  Heart rate and blood pressure.  Body temperature.  Skin for abnormal spots. Counseling Your health care provider may ask you questions about your:  Past medical problems.  Family's medical history.  Alcohol, tobacco, and drug use.  Emotional well-being.  Home life and relationship well-being.  Sexual activity.  Diet, exercise, and sleep habits.  History of falls.  Memory and ability to understand (cognition).  Work and work Statistician.  Access to firearms. What immunizations do I need? Vaccines are usually given at various ages, according to a schedule. Your health care provider will recommend vaccines for you based on your age, medical history, and lifestyle or other factors, such as travel or where you work.   What tests do I need? Blood tests  Lipid and cholesterol levels. These may be checked every 5 years, or more often depending on  your overall health.  Hepatitis C test.  Hepatitis B test. Screening  Lung cancer screening. You may have this screening every year starting at age 46 if you have a 30-pack-year history of smoking and currently smoke or have quit within the past 15 years.  Colorectal cancer screening. ? All adults should have this screening starting at age 100 and continuing until age 64. ? Your health care provider may recommend screening at age 41 if you are at increased risk. ? You will have tests every 1-10 years, depending on your results and the type of screening test.  Prostate cancer screening. Recommendations will vary depending on your family history and other risks.  Genital exam to check for testicular cancer or hernias.  Diabetes screening. ? This is done by checking your blood sugar (glucose) after you have not eaten for a while (fasting). ? You may have this done every 1-3 years.  Abdominal aortic aneurysm (AAA) screening. You may need this if you are a current or former smoker.  STD (sexually transmitted disease) testing, if you are at risk. Follow these instructions at home: Eating and drinking  Eat a diet that includes fresh fruits and vegetables, whole grains, lean protein, and low-fat dairy products. Limit your intake of foods with high amounts of sugar, saturated fats, and salt.  Take vitamin and mineral supplements as recommended by your health care provider.  Do not drink alcohol if your health care provider tells you not to drink.  If you drink alcohol: ? Limit how much you have to  0-2 drinks a day. ? Be aware of how much alcohol is in your drink. In the [place] one drink equals one 12 oz bottle of beer (355 mL), one 5 oz glass of wine (148 mL), or one 1 oz glass of hard liquor (44 mL).   Lifestyle  Take daily care of your teeth and gums. Brush your teeth every morning and night with fluoride toothpaste. Floss one time each day.  Stay active. Exercise for at least 30  minutes 5 or more days each week.  Do not use any products that contain nicotine or tobacco, such as cigarettes, e-cigarettes, and chewing tobacco. If you need help quitting, ask your health care provider.  Do not use drugs.  If you are sexually active, practice safe sex. Use a condom or other form of protection to prevent STIs (sexually transmitted infections).  Talk with your health care provider about taking a low-dose aspirin or statin.  Find healthy ways to cope with stress, such as: ? Meditation, yoga, or listening to music. ? Journaling. ? Talking to a trusted person. ? Spending time with friends and family. Safety  Always wear your seat belt while driving or riding in a vehicle.  Do not drive: ? If you have been drinking alcohol. Do not ride with someone who has been drinking. ? When you are tired or distracted. ? While texting.  Wear a helmet and other protective equipment during sports activities.  If you have firearms in your house, make sure you follow all gun safety procedures. What's next?  Visit your health care provider once a year for an annual wellness visit.  Ask your health care provider how often you should have your eyes and teeth checked.  Stay up to date on all vaccines. This information is not intended to replace advice given to you by your health care provider. Make sure you discuss any questions you have with your health care provider. Document Revised: 06/16/2019 Document Reviewed: 09/11/2018 Elsevier Patient Education  2021 [employer].

## 2021-02-10 NOTE — Assessment & Plan Note (Signed)
Appears euvolemic today. Continue carvedilol 12.5 mg BID, Imdur 30 mg, BP control.

## 2021-02-10 NOTE — Assessment & Plan Note (Signed)
Following with nephrology. Repeat labs pending.

## 2021-02-10 NOTE — Assessment & Plan Note (Signed)
Declines all vaccines despite recommendations. PSA due and pending. Colonoscopy UTD per patient. Declines future colonoscopies given age.  Encouraged regular activity/walking and a healthy diet.  Exam today stable. Labs pending.

## 2021-02-10 NOTE — Progress Notes (Signed)
Subjective:    Patient ID: Michael Chang, male    DOB: [birth year], 75 y.o.   MRN: [id number]  HPI  Roman Ruether is a very pleasant 75 y.o. male who presents today for complete physical.  Immunizations: -Influenza: Did not complete -Covid-19: Did not complete -Shingles: Declines  -Pneumonia: Declines    Diet: He endorses a fair diet.  Exercise: He is not exercising much  Eye exam: Completes annually  Dental exam: No recent visit    Colonoscopy: UTD per patient  PSA: Due  BP Readings from Last 3 Encounters:  02/10/21 (!) 144/85  09/13/20 140/82  03/09/20 (!) 152/86        Review of Systems  Constitutional: Negative for unexpected weight change.  HENT: Negative for rhinorrhea.   Respiratory: Negative for cough and shortness of breath.   Cardiovascular: Negative for chest pain.  Gastrointestinal: Negative for constipation and diarrhea.  Genitourinary: Negative for difficulty urinating.  Musculoskeletal: Negative for arthralgias.  Skin: Positive for wound. Negative for rash.       Chronic ulcer  Allergic/Immunologic: Negative for environmental allergies.  Neurological: Negative for dizziness, numbness and headaches.  Psychiatric/Behavioral: The patient is not nervous/anxious.          Past Medical History:  Diagnosis Date  . Anemia   . CAD (coronary artery disease)   . Chronic combined systolic (congestive) and diastolic (congestive) heart failure ([place])   . Chronic ulcer of right lower extremity ([place])   . CKD (chronic kidney disease), stage III ([place])   . Essential hypertension   . HLD (hyperlipidemia)   . NSTEMI (non-ST elevated myocardial infarction) ([place])   . PVD (peripheral vascular disease) ([place])     Social History   Socioeconomic History  . Marital status: Single    Spouse name: Not on file  . Number of children: Not on file  . Years of education: Not on file  . Highest education level: Not on file  Occupational History  . Not on file   Tobacco Use  . Smoking status: Never Smoker  . Smokeless tobacco: Never Used  Substance and Sexual Activity  . Alcohol use: Not Currently  . Drug use: Not on file  . Sexual activity: Not on file  Other Topics Concern  . Not on file  Social History Narrative  . Not on file   Social Determinants of Health   Financial Resource Strain: Low Risk   . Difficulty of Paying Living Expenses: Not hard at all  Food Insecurity: No Food Insecurity  . Worried About Charity fundraiser in the Last Year: Never true  . Ran Out of Food in the Last Year: Never true  Transportation Needs: No Transportation Needs  . Lack of Transportation (Medical): No  . Lack of Transportation (Non-Medical): No  Physical Activity: Inactive  . Days of Exercise per Week: 0 days  . Minutes of Exercise per Session: 0 min  Stress: No Stress Concern Present  . Feeling of Stress : Not at all  Social Connections: Not on file  Intimate Partner Violence: Not At Risk  . Fear of Current or Ex-Partner: No  . Emotionally Abused: No  . Physically Abused: No  . Sexually Abused: No    History reviewed. No pertinent surgical history.  History reviewed. No pertinent family history.  No Known Allergies  Current Outpatient Medications on File Prior to Visit  Medication Sig Dispense Refill  . amLODipine (NORVASC) 10 MG tablet Take 10 mg by mouth daily.    [name]  aspirin 81 MG EC tablet Take by mouth.    . carvedilol (COREG) 12.5 MG tablet Take 1 tablet (12.5 mg total) by mouth 2 (two) times daily with a meal. 180 tablet 1  . hydrALAZINE (APRESOLINE) 100 MG tablet Take 1 tablet (100 mg total) by mouth 2 (two) times daily. 180 tablet 1  . isosorbide mononitrate (IMDUR) 30 MG 24 hr tablet Take 2 tablets by mouth in the AM, and take 1/2 tablet by mouth in the PM. 225 tablet 1  . Multiple Vitamin (MULTIVITAMIN WITH MINERALS) TABS tablet Take 1 tablet by mouth daily.    . Multiple Vitamins-Minerals (CENTRUM SILVER 50+MEN) TABS Take by  mouth.     No current facility-administered medications on file prior to visit.    BP (!) 144/85   Temp 97.6 F (36.4 C) (Temporal)   Ht 6' 1.5" (1.867 m)   Wt 155 lb (70.3 kg)   BMI 20.17 kg/m  Objective:   Physical Exam HENT:     Right Ear: Tympanic membrane and ear canal normal.     Left Ear: Tympanic membrane and ear canal normal.     Nose: Nose normal.     Right Sinus: No maxillary sinus tenderness or frontal sinus tenderness.     Left Sinus: No maxillary sinus tenderness or frontal sinus tenderness.  Eyes:     Conjunctiva/sclera: Conjunctivae normal.     Pupils: Pupils are equal, round, and reactive to light.  Neck:     Thyroid: No thyromegaly.     Vascular: No carotid bruit.  Cardiovascular:     Rate and Rhythm: Normal rate and regular rhythm.     Heart sounds: Normal heart sounds.  Pulmonary:     Effort: Pulmonary effort is normal.     Breath sounds: Normal breath sounds. No wheezing or rales.  Abdominal:     General: Bowel sounds are normal.     Palpations: Abdomen is soft.     Tenderness: There is no abdominal tenderness.  Musculoskeletal:        General: Normal range of motion.     Cervical back: Neck supple.  Skin:    General: Skin is warm and dry.     Comments: Patient refusing to allow for me to see his chronic skin ulcer  Neurological:     Mental Status: He is alert and oriented to person, place, and time.     Cranial Nerves: No cranial nerve deficit.     Deep Tendon Reflexes: Reflexes are normal and symmetric.  Psychiatric:        Mood and Affect: Mood normal.           Assessment & Plan:      This visit occurred during the SARS-CoV-2 public health emergency.  Safety protocols were in place, including screening questions prior to the visit, additional usage of staff PPE, and extensive cleaning of exam room while observing appropriate contact time as indicated for disinfecting solutions.

## 2021-02-10 NOTE — Assessment & Plan Note (Signed)
Asymptomatic, follows with cardiology. Continue carvedilol 12.5 mg BID, Imdur 30 mg.  Labs pending.

## 2021-02-27 ENCOUNTER — Other Ambulatory Visit: Payer: Self-pay | Admitting: Physician Assistant

## 2021-02-27 ENCOUNTER — Other Ambulatory Visit: Payer: Self-pay | Admitting: Cardiovascular Disease

## 2021-03-06 ENCOUNTER — Other Ambulatory Visit: Payer: Self-pay | Admitting: Cardiovascular Disease

## 2021-03-06 NOTE — Telephone Encounter (Signed)
Please reschedule F/U appointment. Thank you!

## 2021-03-07 ENCOUNTER — Ambulatory Visit: Payer: Medicare Other | Admitting: Cardiovascular Disease

## 2021-03-14 ENCOUNTER — Ambulatory Visit: Payer: Medicare Other | Admitting: Cardiovascular Disease

## 2021-03-14 NOTE — Progress Notes (Signed)
Cardiology Clinic Note   Patient Name: Michael Chang Date of Encounter: 03/16/2021  Primary Care Provider:  [name], NP Primary Cardiologist:  [name], MD  Patient Profile    Michael Chang 75 year old male presents the clinic today for follow-up evaluation of his chronic diastolic CHF and hypertension.  Past Medical History    Past Medical History:  Diagnosis Date   Anemia    CAD (coronary artery disease)    Chronic combined systolic (congestive) and diastolic (congestive) heart failure (HCC)    Chronic ulcer of right lower extremity (HCC)    CKD (chronic kidney disease), stage III (HCC)    Essential hypertension    HLD (hyperlipidemia)    NSTEMI (non-ST elevated myocardial infarction) ([place])    PVD (peripheral vascular disease) ([place])    History reviewed. No pertinent surgical history.  Allergies  No Known Allergies  History of Present Illness    [name] has a PMH of HTN, anemia, chronic combined systolic and diastolic CHF, CKD stage III, HLD, PVD, and NSTEMI.  He was diagnosed with heart failure 3/21 in [place].  He did have a NSTEMI which was treated medically.  He was initially seen for a syncopal episode.  During that time he was noted to be anemic and underwent EGD which showed moderately erythematous stomach mucosa.  A colonoscopy showed internal hemorrhoids.  It was not clear if he had acute kidney injury or whether he had chronic kidney disease.  He retired from [employer] work and moved to [place] to be closer to his sister.  He was hospitalized at [facility] 5/21.  His echocardiogram showed EF of 55 to 60% with severe LVH.  His troponins were mildly elevated.  He was last seen by Dr. [name] 09/13/2020 and denied chest pain at that time.  He denied shortness of breath and palpitations.  He reported compliance with his medications.  He reported his vision had improved but was still significantly impaired.  He does not drive.  He presents to  clinic today for follow-up evaluation states he feels well.  He does notice shortness of breath with increased physical activity but not with normal activity.  He presents with his sister.  He reports that his blood pressure routinely runs in the 140s over 80s range.  He notes that when his blood pressure is "normal" he does not feel as well.  We discussed his echocardiogram and his LVH.  We also discussed her increased risks of CVA and renal damage with increased blood pressure.  I reviewed his diet.  He admits to sodium indiscretion.  I will increase his carvedilol to 25 mg at night and maintain his 12.5 mg dosing during the day.  We will prescribe 90-day supply for his amlodipine.  I will give him a blood pressure monitor, have him maintain a blood pressure log, and follow-up in 1 month.  Today he denies chest pain, shortness of breath, lower extremity edema, fatigue, palpitations, melena, hematuria, hemoptysis, diaphoresis, weakness, presyncope, syncope, orthopnea, and PND.   Home Medications    Prior to Admission medications   Medication Sig Start Date End Date Taking? Authorizing Provider  amLODipine (NORVASC) 10 MG tablet Take 10 mg by mouth daily. 03/02/20   [provider]  aspirin 81 MG EC tablet Take by mouth. 01/05/20   [provider]  carvedilol (COREG) 12.5 MG tablet TAKE 1 TABLET BY MOUTH  TWICE DAILY WITH A MEAL 03/09/21   Wellington Hampshire, MD  hydrALAZINE (  APRESOLINE) 100 MG tablet TAKE 1 TABLET BY MOUTH  TWICE DAILY 02/28/21   Wellington Hampshire, MD  isosorbide mononitrate (IMDUR) 30 MG 24 hr tablet TAKE 2 TABLETS BY MOUTH IN  THE MORNING AND TAKE  ONE-HALF TABLET BY MOUTH IN THE EVENING 02/28/21   Wellington Hampshire, MD  Multiple Vitamin (MULTIVITAMIN WITH MINERALS) TABS tablet Take 1 tablet by mouth daily.    [provider]  Multiple Vitamins-Minerals (CENTRUM SILVER 50+MEN) TABS Take by mouth.    [provider]    Family History    History  reviewed. No pertinent family history. He indicated that his mother is deceased. He indicated that his father is deceased.  Social History    Social History   Socioeconomic History   Marital status: Single    Spouse name: Not on file   Number of children: Not on file   Years of education: Not on file   Highest education level: Not on file  Occupational History   Not on file  Tobacco Use   Smoking status: Never   Smokeless tobacco: Never  Substance and Sexual Activity   Alcohol use: Not Currently   Drug use: Not on file   Sexual activity: Not on file  Other Topics Concern   Not on file  Social History Narrative   Not on file   Social Determinants of Health   Financial Resource Strain: Not on file  Food Insecurity: Not on file  Transportation Needs: Not on file  Physical Activity: Not on file  Stress: Not on file  Social Connections: Not on file  Intimate Partner Violence: Not on file     Review of Systems    General:  No chills, fever, night sweats or weight changes.  Cardiovascular:  No chest pain, dyspnea on exertion, edema, orthopnea, palpitations, paroxysmal nocturnal dyspnea. Dermatological: No rash, lesions/masses Respiratory: No cough, dyspnea Urologic: No hematuria, dysuria Abdominal:   No nausea, vomiting, diarrhea, bright red blood per rectum, melena, or hematemesis Neurologic:  No visual changes, wkns, changes in mental status. All other systems reviewed and are otherwise negative except as noted above.  Physical Exam    VS:  BP (!) 144/84 (BP Location: Right Arm, Patient Position: Sitting, Cuff Size: Normal)   Pulse 66   Ht 6' 1.5" (1.867 m)   Wt 156 lb 12.8 oz (71.1 kg)   SpO2 94%   BMI 20.41 kg/m  , BMI Body mass index is 20.41 kg/m. GEN: Well nourished, well developed, in no acute distress. HEENT: normal. Neck: Supple, no JVD, carotid bruits, or masses. Cardiac: RRR, no murmurs, rubs, or gallops. No clubbing, cyanosis, edema.  Radials/DP/PT  2+ and equal bilaterally.  Respiratory:  Respirations regular and unlabored, clear to auscultation bilaterally. GI: Soft, nontender, nondistended, BS + x 4. MS: no deformity or atrophy. Skin: warm and dry, no rash. Neuro:  Strength and sensation are intact. Psych: Normal affect.  Accessory Clinical Findings    Recent Labs: 02/10/2021: ALT 20; BUN 19; Creatinine, Ser 1.94; Hemoglobin 12.6; Platelets 224.0; Potassium 4.5; Sodium 143; TSH 1.79   Recent Lipid Panel    Component Value Date/Time   CHOL 149 02/10/2021 0936   TRIG 79.0 02/10/2021 0936   HDL 43.50 02/10/2021 0936   CHOLHDL 3 02/10/2021 0936   VLDL 15.8 02/10/2021 0936   LDLCALC 90 02/10/2021 0936    ECG personally reviewed by me today-normal sinus rhythm with sinus arrhythmia 66 bpm- No acute changes  EKG 09/13/2020 Normal sinus  rhythm no ST or T wave deviation  Echocardiogram 02/14/2020 IMPRESSIONS     1. Left ventricular ejection fraction, by estimation, is 55 to 60%. The  left ventricle has normal function. The left ventricle has no regional  wall motion abnormalities. There is severe left ventricular hypertrophy.  Left ventricular diastolic parameters   are consistent with Grade I diastolic dysfunction (impaired relaxation).   2. Right ventricular systolic function is normal. The right ventricular  size is normal.   3. Left atrial size was mildly dilated.  Assessment & Plan   1.  Chronic diastolic CHF-no increased DOE or activity intolerance.  Euvolemic today.  Echocardiogram showed LVEF 55-60% with G1 DD.  Left atrium mildly dilated. Continue increased carvedilol to 12 point 5 in the AM and 25 in p.m.,  Continue hydralazine, Imdur Heart healthy low-sodium diet-salty 6 given Increase physical activity as tolerated  Essential hypertension-BP today 144/84.  Well-controlled at home. Continue  amlodipine, hydralazine, Imdur Increase carvedilol to 12.5 mg a.m. and 25 mg p.m. Heart healthy low-sodium  diet-salty 6 given Increase physical activity as tolerated Maintain blood pressure log Blood pressure cuff provided  Coronary artery disease-no chest pain today.  Previous NSTEMI managed medically. Continue amlodipine, carvedilol, aspirin, Imdur, hydralazine Heart healthy low-sodium diet-salty 6 given Increase physical activity as tolerated  CKD-creatinine 1.94 at baseline 02/10/2021 Follows with PCP  Disposition: Follow-up with Dr. Sophronia Simas in 6 months.  Jossie Ng. Suvan Stcyr NP-C    03/16/2021, 3:11 PM [facility] [place] [address] Office [phone] Fax [phone]  Notice: This dictation was prepared with Dragon dictation along with smaller phrase technology. Any transcriptional errors that result from this process are unintentional and may not be corrected upon review.  I spent 15 minutes examining this patient, reviewing medications, and using patient centered shared decision making involving her cardiac care.  Prior to her visit I spent greater than 20 minutes reviewing her past medical history,  medications, and prior cardiac tests.

## 2021-03-16 ENCOUNTER — Encounter: Payer: Self-pay | Admitting: General Practice

## 2021-03-16 ENCOUNTER — Other Ambulatory Visit: Payer: Self-pay

## 2021-03-16 ENCOUNTER — Ambulatory Visit: Payer: Medicare Other | Admitting: General Practice

## 2021-03-16 VITALS — BP 144/84 | HR 66 | Ht 73.5 in | Wt 156.8 lb

## 2021-03-16 DIAGNOSIS — I2510 Atherosclerotic heart disease of native coronary artery without angina pectoris: Secondary | ICD-10-CM

## 2021-03-16 DIAGNOSIS — I10 Essential (primary) hypertension: Secondary | ICD-10-CM | POA: Diagnosis not present

## 2021-03-16 DIAGNOSIS — I5032 Chronic diastolic (congestive) heart failure: Secondary | ICD-10-CM | POA: Diagnosis not present

## 2021-03-16 DIAGNOSIS — N184 Chronic kidney disease, stage 4 (severe): Secondary | ICD-10-CM

## 2021-03-16 MED ORDER — CARVEDILOL 25 MG PO TABS: ORAL_TABLET | ORAL | 3 refills | Status: DC

## 2021-03-16 MED ORDER — AMLODIPINE BESYLATE 10 MG PO TABS: 10.0000 mg | ORAL_TABLET | Freq: Every day | ORAL | 3 refills | Status: DC

## 2021-03-16 NOTE — Patient Instructions (Signed)
Medication Instructions:  CARVEDILOL 12.'5MG'$  AM AND '25MG'$  PM *If you need a refill on your cardiac medications before your next appointment, please call your pharmacy*  Lab Work:   Testing/Procedures:  NONE    NONE  Special Instructions TAKE AND LOG YOUR BLOOD PRESSURE  PLEASE READ AND FOLLOW SALTY 6-ATTACHED-1,'800mg'$  daily  PLEASE INCREASE PHYSICAL ACTIVITY AS TOLERATED  Follow-Up: Your next appointment:  1-2 month(s)/ 1st AVAILABLE  In Person with [name], FNP OR IF UNAVAILABLE Ben Avon, FNP-C   At [facility], you and your health needs are our priority.  As part of our continuing mission to provide you with exceptional heart care, we have created designated Provider Care Teams.  These Care Teams include your primary Cardiologist (physician) and Advanced Practice Providers (APPs -  Physician Assistants and Nurse Practitioners) who all work together to provide you with the care you need, when you need it.  We recommend signing up for the patient portal called "MyChart".  Sign up information is provided on this After Visit Summary.  MyChart is used to connect with patients for Virtual Visits (Telemedicine).  Patients are able to view lab/test results, encounter notes, upcoming appointments, etc.  Non-urgent messages can be sent to your provider as well.   To learn more about what you can do with MyChart, go to [email or web address].              6 SALTY THINGS TO AVOID     1,'800MG'$  DAILY

## 2021-03-22 NOTE — Telephone Encounter (Signed)
Seen at [facility]

## 2021-03-23 NOTE — Addendum Note (Signed)
Addended by: Jacqulynn Cadet on: 03/23/2021 09:18 AM   Modules accepted: Orders

## 2021-03-30 DIAGNOSIS — Z961 Presence of intraocular lens: Secondary | ICD-10-CM | POA: Diagnosis not present

## 2021-03-30 DIAGNOSIS — H18051 Posterior corneal pigmentations, right eye: Secondary | ICD-10-CM | POA: Diagnosis not present

## 2021-03-30 DIAGNOSIS — H52223 Regular astigmatism, bilateral: Secondary | ICD-10-CM | POA: Diagnosis not present

## 2021-03-30 DIAGNOSIS — H4423 Degenerative myopia, bilateral: Secondary | ICD-10-CM | POA: Diagnosis not present

## 2021-03-30 DIAGNOSIS — H15833 Staphyloma posticum, bilateral: Secondary | ICD-10-CM | POA: Diagnosis not present

## 2021-03-30 DIAGNOSIS — H5203 Hypermetropia, bilateral: Secondary | ICD-10-CM | POA: Diagnosis not present

## 2021-03-30 DIAGNOSIS — H524 Presbyopia: Secondary | ICD-10-CM | POA: Diagnosis not present

## 2021-04-26 IMAGING — CT CT HEAD W/O CM
4 series · 17 of 47 positions shown, 19 images · non-contrast
Comparison: None.

CLINICAL DATA: Head trauma.

EXAM:
CT HEAD WITHOUT CONTRAST
TECHNIQUE: Contiguous axial images were obtained from the base of the skull
through the vertex without intravenous contrast.

[Series 2: head wo · axial · 0.42mm/px · z∈[-106,+14]mm · 7 of 32 slices shown, 9 images]
[im 4/32  brain]
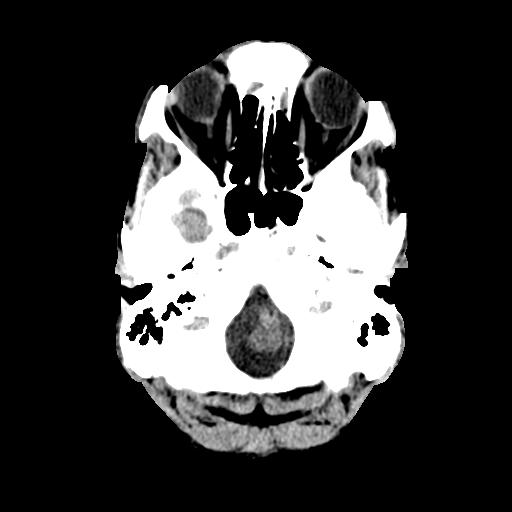
[im 4/32  bone]
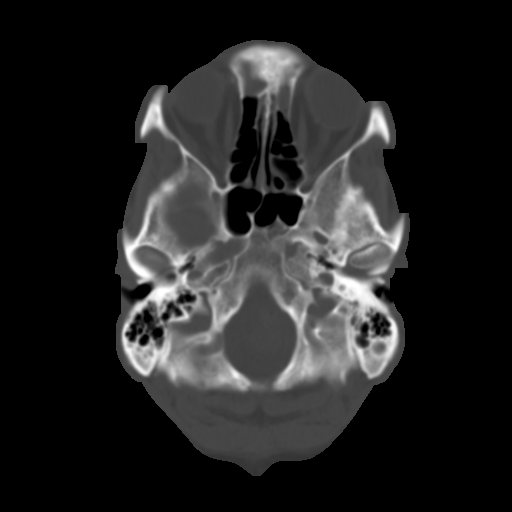
[im 8/32  brain]
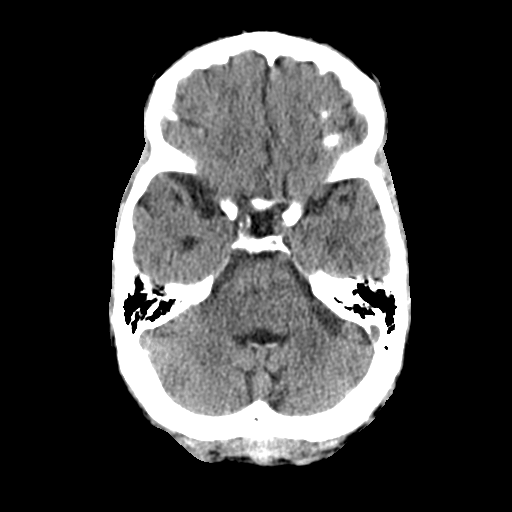
[im 12/32  brain]
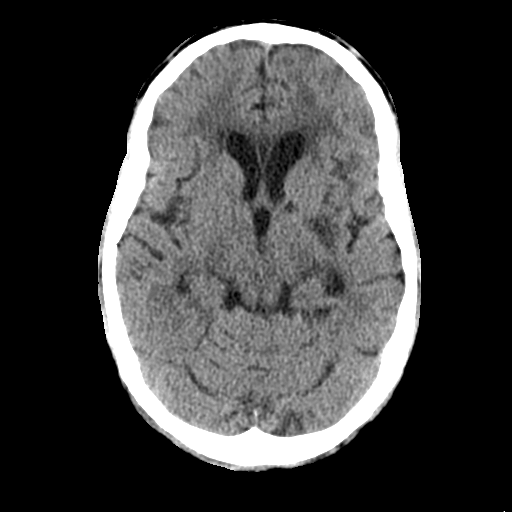
[im 16/32  brain]
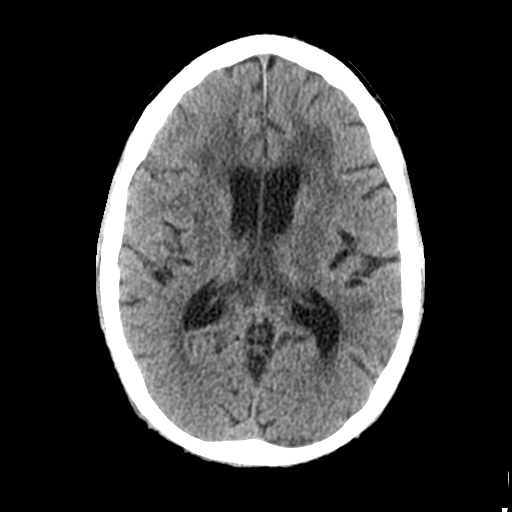
[im 20/32  brain]
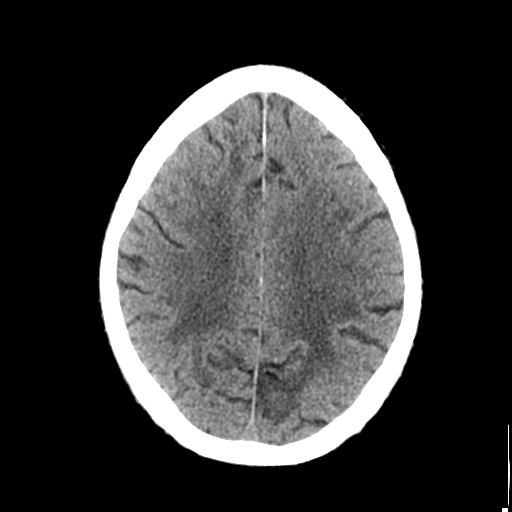
[im 20/32  bone]
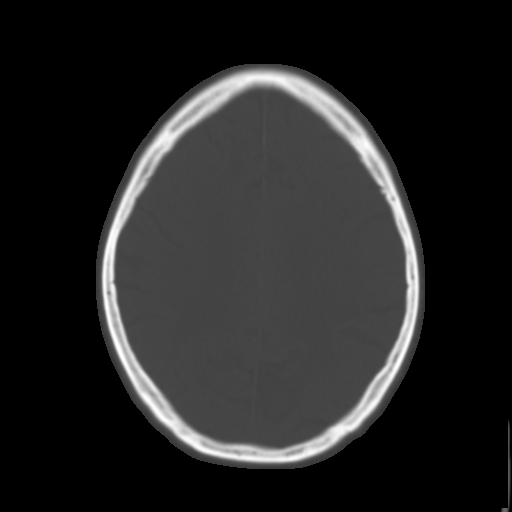
[im 24/32  brain]
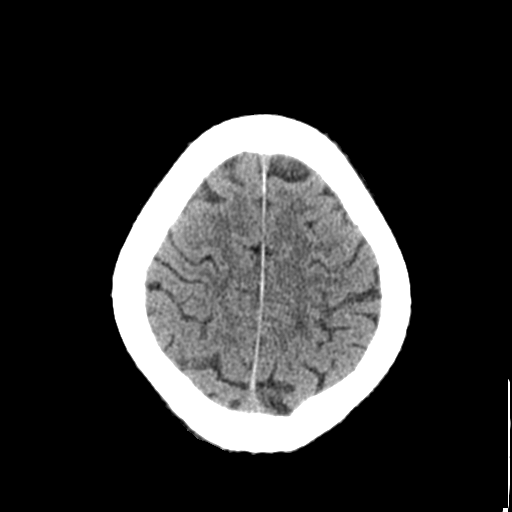
[im 28/32  brain]
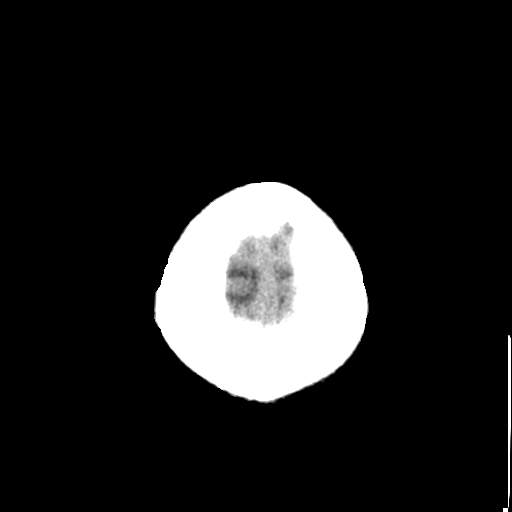

[Series 3: head bone · axial · 0.42mm/px · z∈[-107,-51]mm · 4 of 80 slices shown]
[im 8/80  bone]
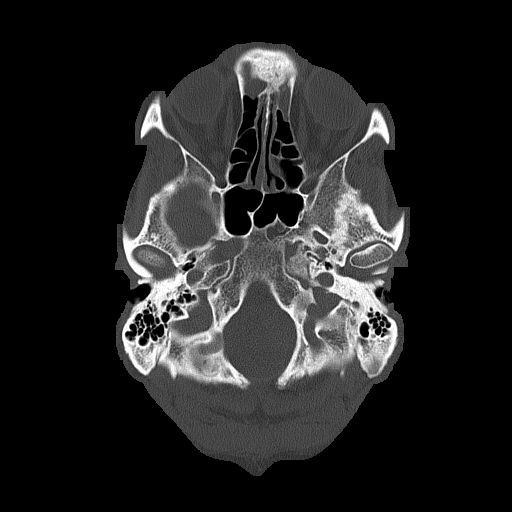
[im 16/80  bone]
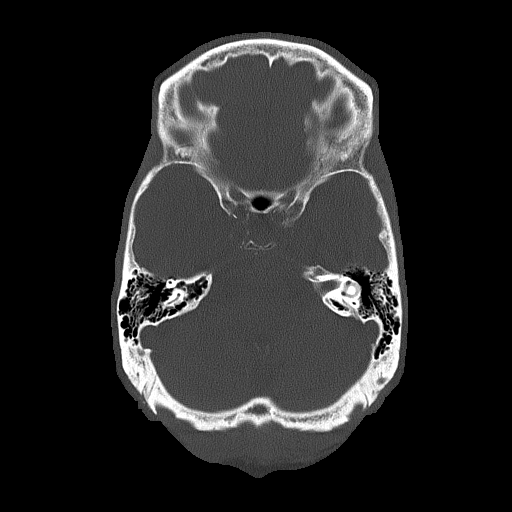
[im 24/80  bone]
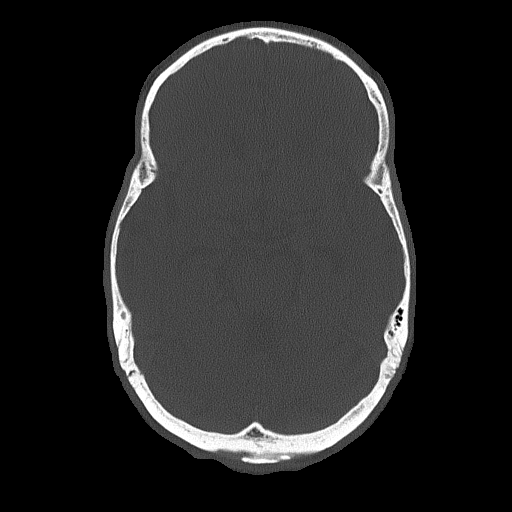
[im 36/80  bone]
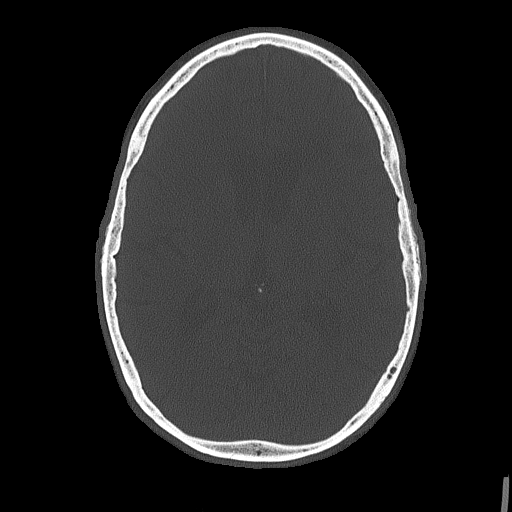

[Series 4: coronal soft tissue · coronal · 0.33mm/px · 3 of 64 slices shown]
[im 22/64  brain]
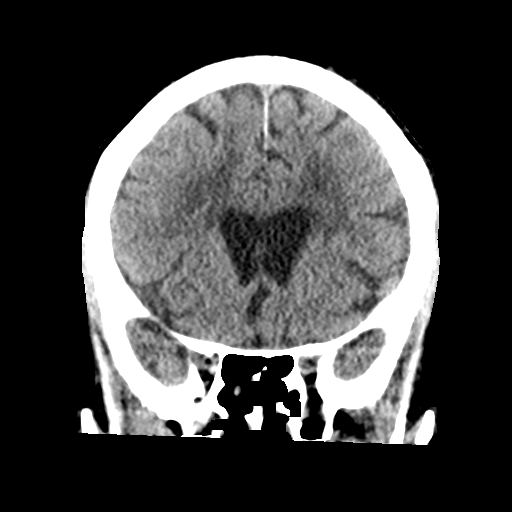
[im 29/64  brain]
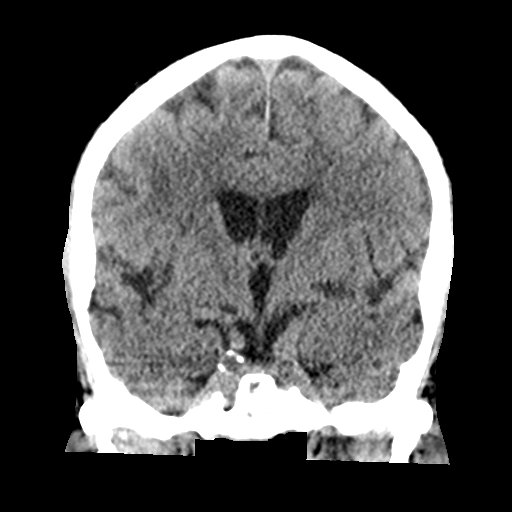
[im 36/64  brain]
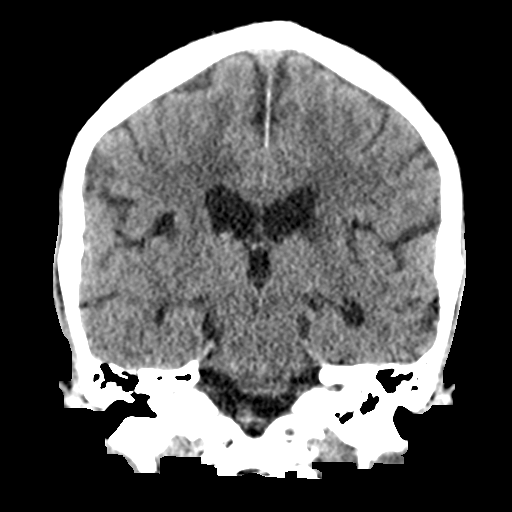

[Series 5: sagittal soft tissue · sagittal · 0.34mm/px · 3 of 50 slices shown]
[im 17/50  brain]
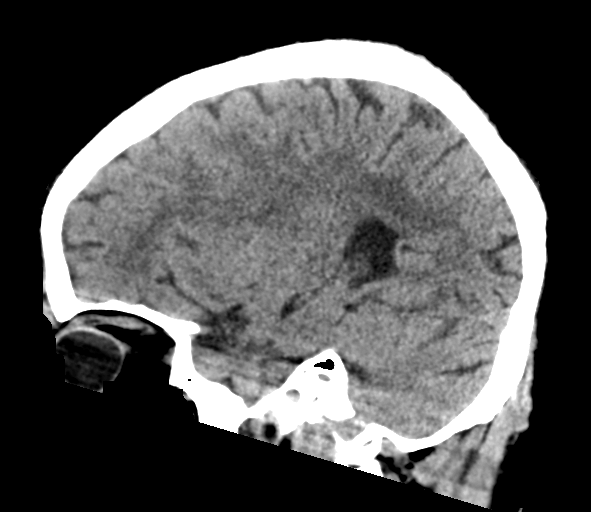
[im 25/50  brain]
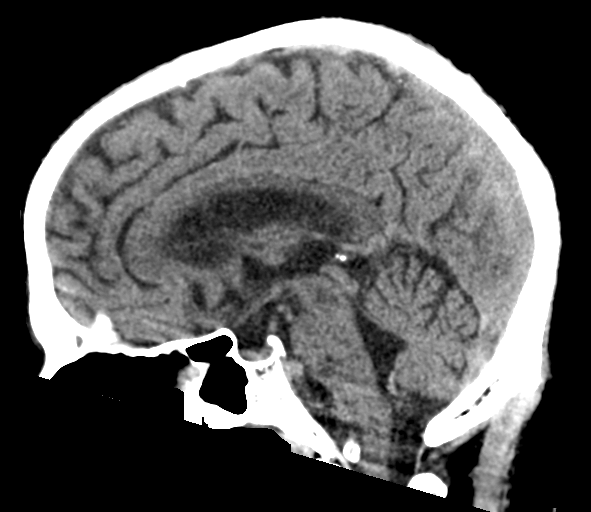
[im 33/50  brain]
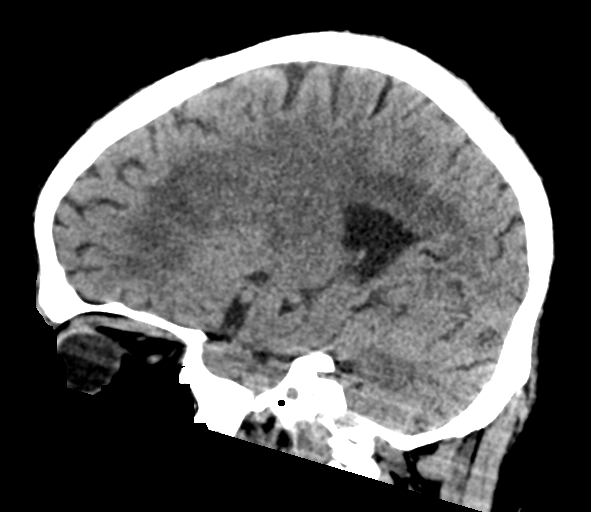

[17 of 47 positions shown; findings below may reference images not displayed]

FINDINGS: Brain: Mild generalized age related parenchymal volume loss with
commensurate dilatation of the ventricles and sulci. Chronic small
vessel ischemic changes within the bilateral periventricular and
subcortical white matter regions.

No mass, hemorrhage, edema or other evidence of acute parenchymal
abnormality. No extra-axial hemorrhage.

Vascular: Chronic calcified atherosclerotic changes of the large
vessels at the skull base. No unexpected hyperdense vessel.

Skull: Normal. Negative for fracture or focal lesion.

Sinuses/Orbits: Chronic appearing frontal sinus disease. Periorbital
and retro-orbital soft tissues are unremarkable.

Other: Scalp edema/laceration overlying the RIGHT frontal bone. No
underlying fracture.
IMPRESSION: 1. Scalp edema/laceration overlying the RIGHT frontal bone. No
underlying fracture.
2. No acute intracranial abnormality. No intracranial mass,
hemorrhage or edema.
3. Chronic small vessel ischemic changes in the white matter.

## 2021-05-19 IMAGING — US US RENAL
1 series · 14 of 25 positions shown · non-contrast
Comparison: None.

CLINICAL DATA: Anemia in chronic kidney disease.

EXAM:
RENAL / URINARY TRACT ULTRASOUND COMPLETE

[Series 1: us renal · 14 of 54 slices shown]
[im 1/54]
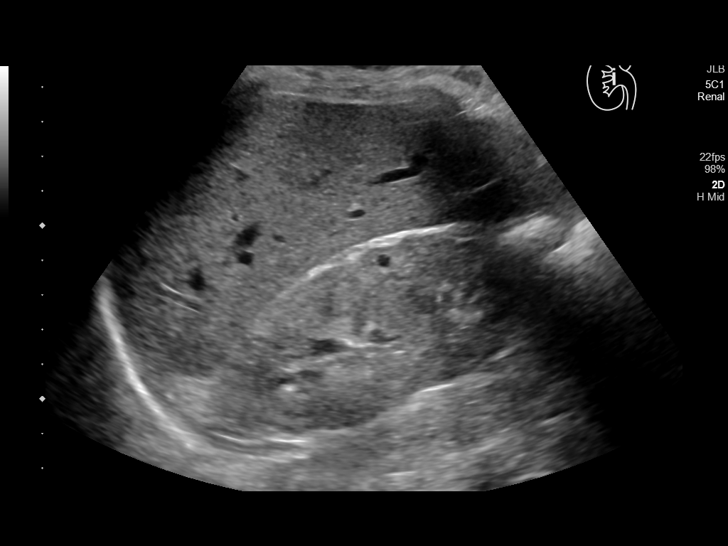
[im 5/54]
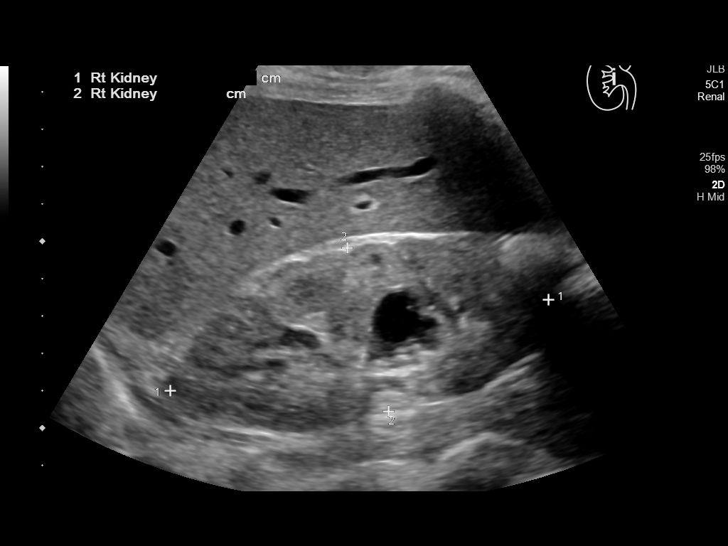
[im 9/54]
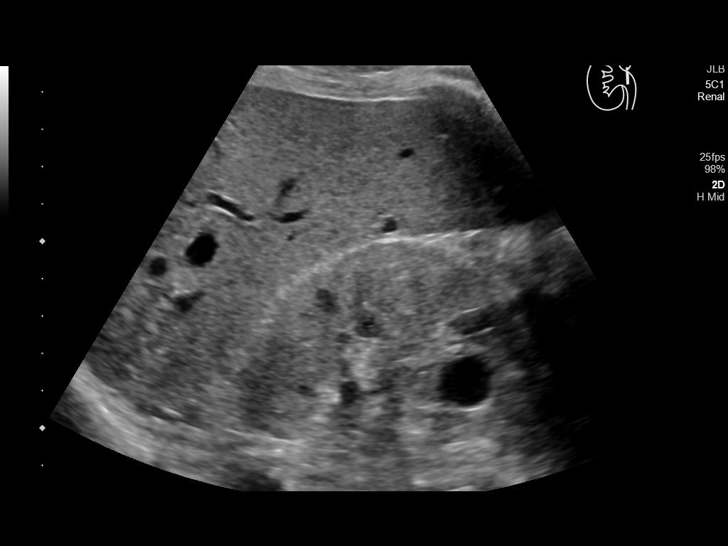
[im 14/54]
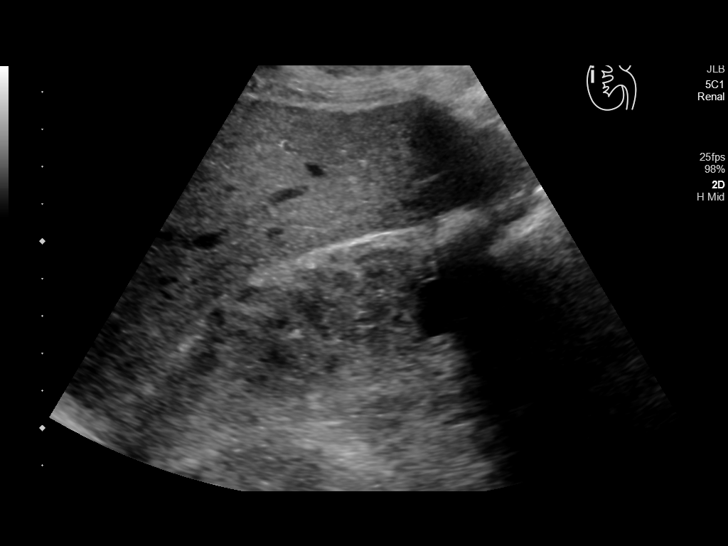
[im 18/54]
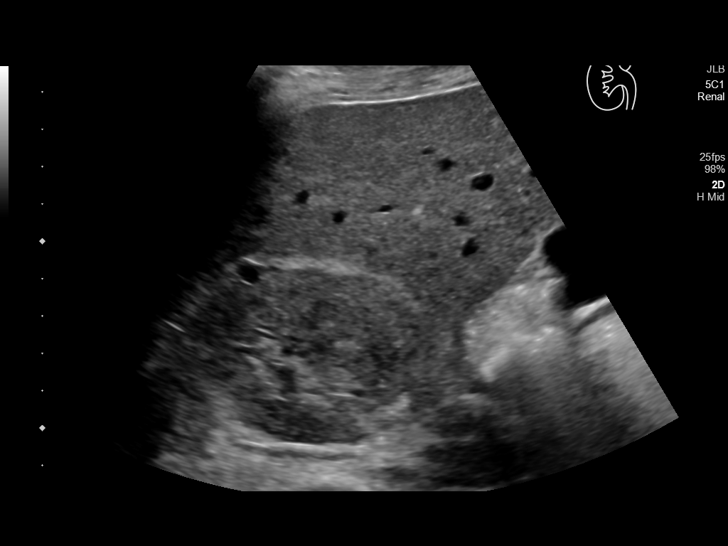
[im 20/54]
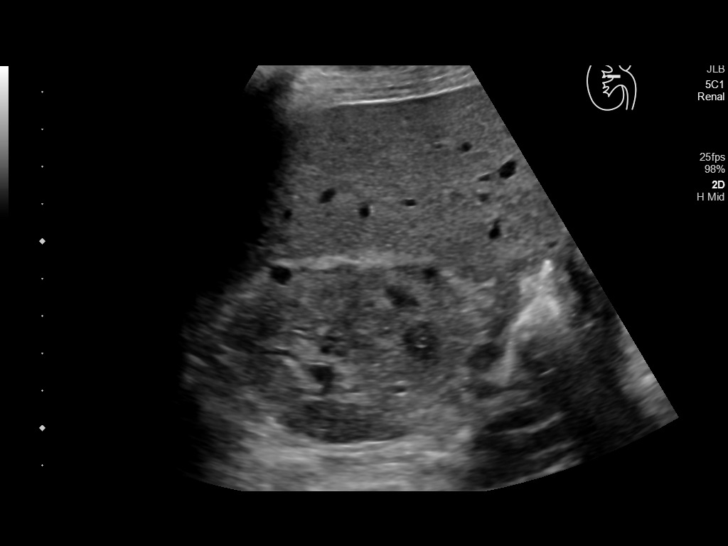
[im 25/54]
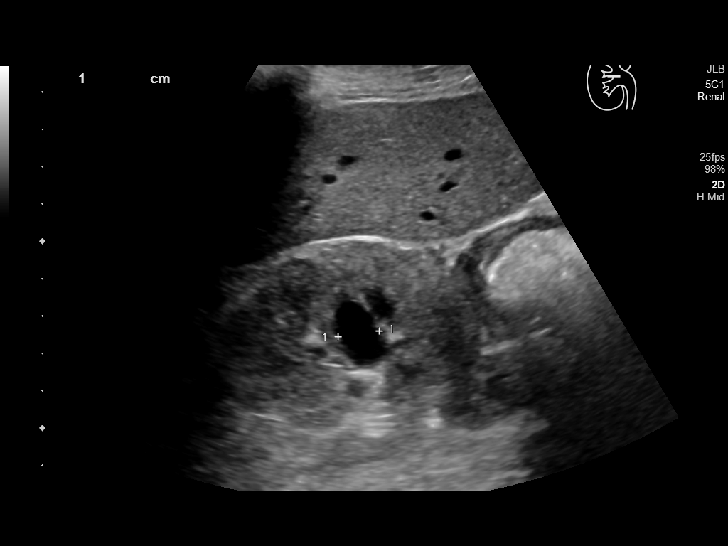
[im 29/54]
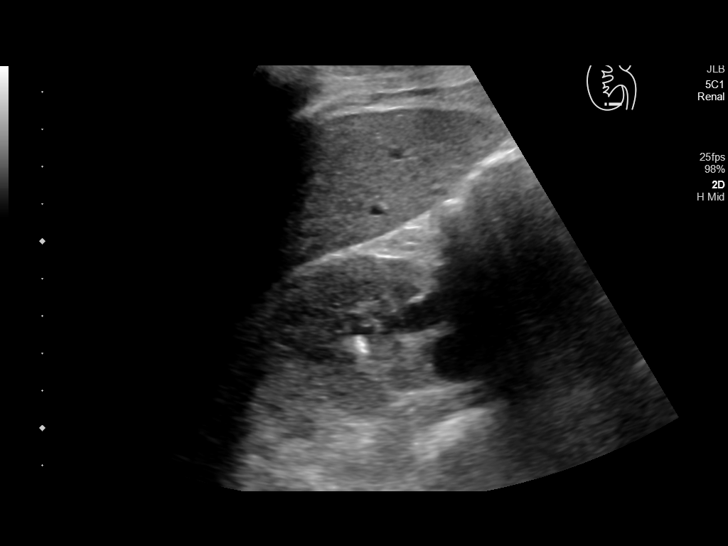
[im 34/54]
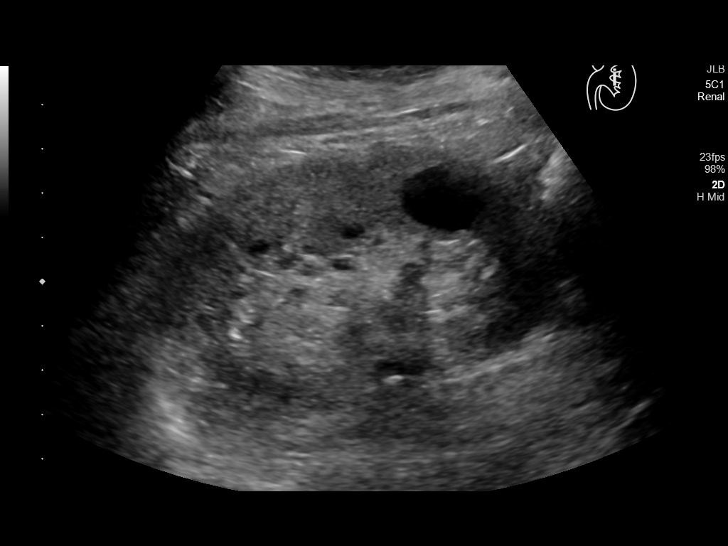
[im 36/54]
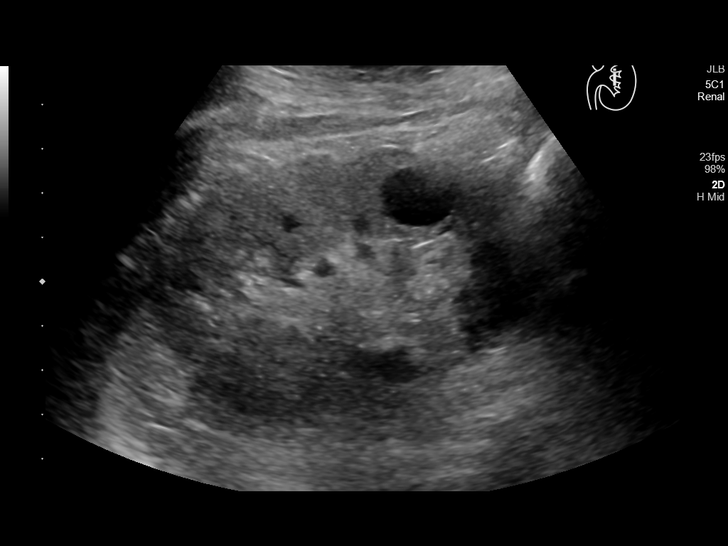
[im 40/54]
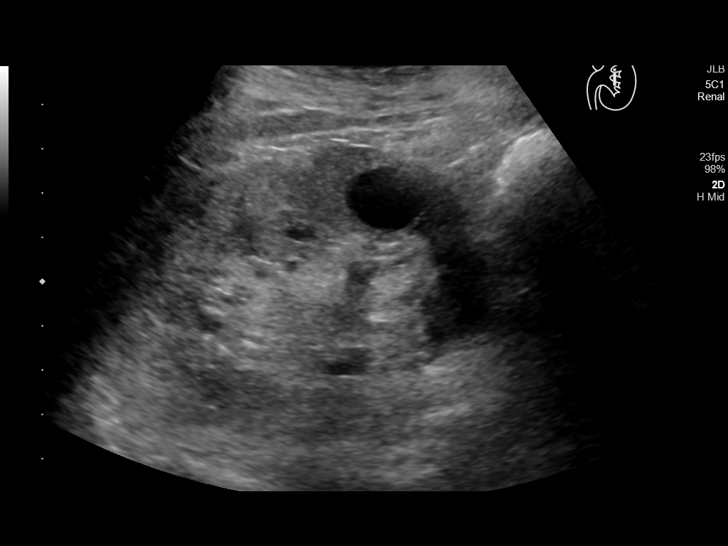
[im 45/54]
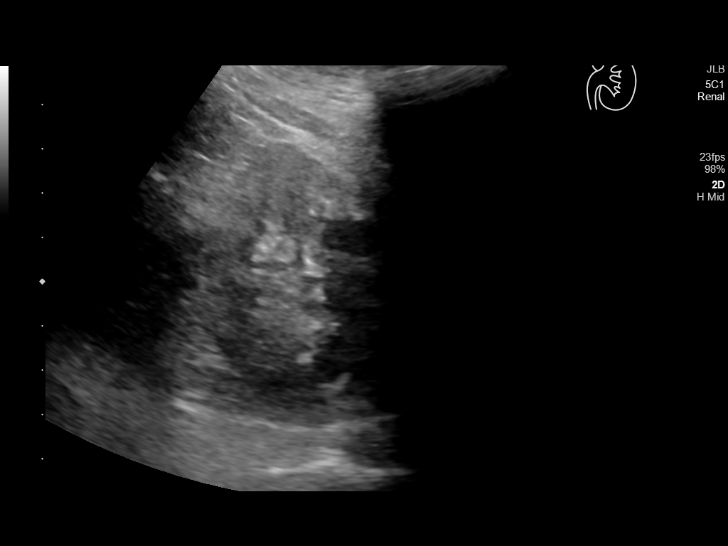
[im 49/54]
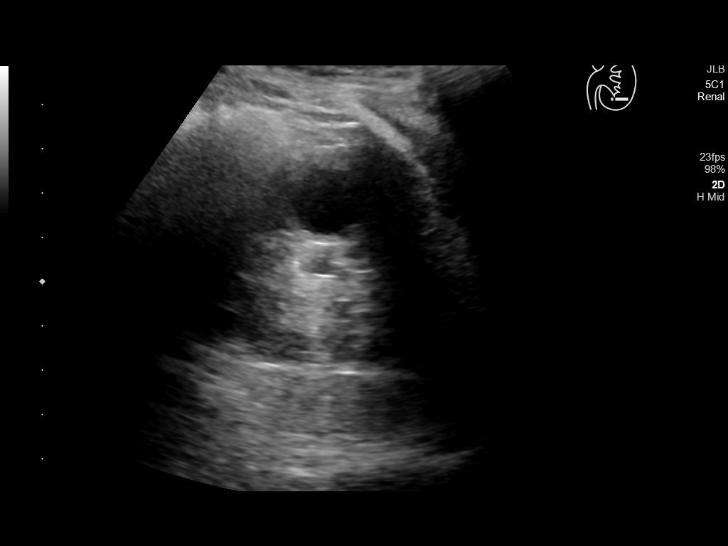
[im 54/54]
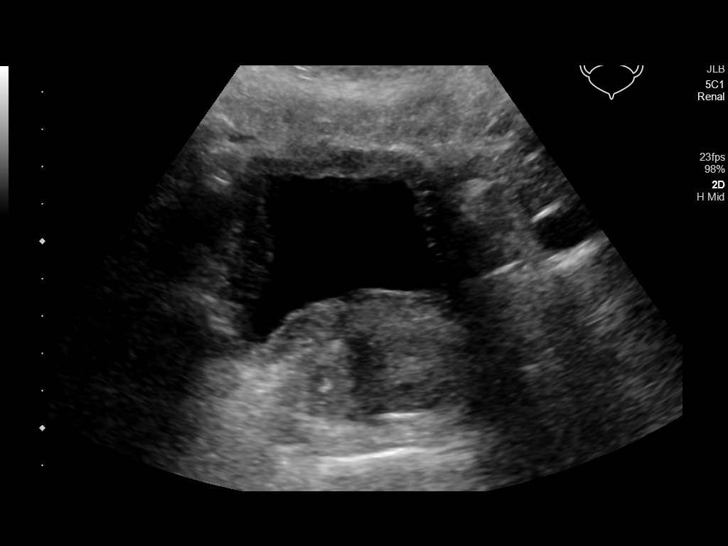

[14 of 25 positions shown; findings below may reference images not displayed]

FINDINGS: Right Kidney:

Renal measurements: 10.4 x 4.5 x 5.1 cm. = volume: 125.1 mL. Diffuse
increased echogenicity. There are multiple well-circumscribed
anechoic cyst with increased through transmission. The largest is in
the inferior pole measuring 1.4 x 1.3 x 1.1 cm. No hydronephrosis

Left Kidney:

Renal measurements: 9.5 x 5.3 x 4.7 cm = volume: 123.4 mL. Increased
parenchymal echogenicity. Simple appearing left kidney cysts are
noted within the inferior pole. The largest measures 2.7 x 2.2 x
cm. No hydronephrosis.

Bladder:

Diffuse bladder wall thickening is noted without focal abnormality.

Other:

None.
IMPRESSION: 1. No acute abnormality.
2. Bilateral echogenic kidneys compatible with chronic medical renal
disease.
3. Bilateral simple appearing kidney cysts

## 2021-05-23 ENCOUNTER — Other Ambulatory Visit: Payer: Self-pay | Admitting: Cardiovascular Disease

## 2021-05-24 NOTE — Telephone Encounter (Signed)
Refill Request.  

## 2021-05-30 NOTE — Progress Notes (Signed)
Cardiology Clinic Note   Patient Name: Michael Chang Date of Encounter: 06/01/2021  Primary Care Provider:  [name], NP Primary Cardiologist:  [name], MD  Patient Profile    Michael Chang 75 year old male presents the clinic today for follow-up evaluation of his chronic diastolic CHF and hypertension.  Past Medical History    Past Medical History:  Diagnosis Date   Anemia    CAD (coronary artery disease)    Chronic combined systolic (congestive) and diastolic (congestive) heart failure (HCC)    Chronic ulcer of right lower extremity (HCC)    CKD (chronic kidney disease), stage III (HCC)    Essential hypertension    HLD (hyperlipidemia)    NSTEMI (non-ST elevated myocardial infarction) ([place])    PVD (peripheral vascular disease) ([place])    History reviewed. No pertinent surgical history.  Allergies  No Known Allergies  History of Present Illness    Michael Chang has a PMH of HTN, anemia, chronic combined systolic and diastolic CHF, CKD stage III, HLD, PVD, and NSTEMI.  He was diagnosed with heart failure 3/21 in [place].  He did have a NSTEMI which was treated medically.  He was initially seen for a syncopal episode.  During that time he was noted to be anemic and underwent EGD which showed moderately erythematous stomach mucosa.  A colonoscopy showed internal hemorrhoids.  It was not clear if he had acute kidney injury or whether he had chronic kidney disease.  He retired from [employer] work and moved to [place] to be closer to his sister.   He was hospitalized at [facility] 5/21.  His echocardiogram showed EF of 55 to 60% with severe LVH.  His troponins were mildly elevated.   He was last seen by Dr. [name] 09/13/2020 and denied chest pain at that time.  He denied shortness of breath and palpitations.  He reported compliance with his medications.  He reported his vision had improved but was still significantly impaired.  He does not drive.   He presented  to clinic 03/16/21 for follow-up evaluation and stated he felts well.  He did notice shortness of breath with increased physical activity but not with normal activity.  He presented with his sister.  He reported that his blood pressure routinely ran in the 140s over 80s range.  He noted that when his blood pressure was "normal" he did not feel as well.  We discussed his echocardiogram and his LVH.  We also discussed his increased risks of CVA and renal damage with increased blood pressure.  I reviewed his diet.  He admitted to sodium indiscretion.  I  increased his carvedilol to 25 mg at night and maintain his 12.5 mg dosing during the day.  We  prescribed 90-day supply for his amlodipine.  I gave him a blood pressure monitor, had him maintain a blood pressure log, and planned follow-up in 1 month.  He presents to the clinic today for follow-up evaluation states he feels well.  He continues to self isolate at home due to the COVID-19 virus.  He reports that he has not ever been a vaccination type person.  He has not received his COVID vaccinations and has not been infected.  We reviewed his blood pressure which is much better today at 126/80.  He is tolerating medication increase well.  He continues to eat a low-salt low sugar diet.  He presents by himself today.  I will continue his current medication regimen.  I will have  him follow-up in 6 months.   Today he denies chest pain, shortness of breath, lower extremity edema, fatigue, palpitations, melena, hematuria, hemoptysis, diaphoresis, weakness, presyncope, syncope, orthopnea, and PND.  Home Medications    Prior to Admission medications   Medication Sig Start Date End Date Taking? Authorizing Provider  hydrALAZINE (APRESOLINE) 100 MG tablet TAKE 1 TABLET BY MOUTH  TWICE DAILY 05/24/21   Wellington Hampshire, MD  isosorbide mononitrate (IMDUR) 30 MG 24 hr tablet TAKE 2 TABLETS BY MOUTH IN  THE MORNING AND TAKE  ONE-HALF TABLET BY MOUTH IN THE EVENING 05/24/21    Wellington Hampshire, MD  amLODipine (NORVASC) 10 MG tablet Take 1 tablet (10 mg total) by mouth daily. 03/16/21   Deberah Pelton, NP  aspirin 81 MG EC tablet Take by mouth. 01/05/20   [provider]  carvedilol (COREG) 25 MG tablet Take 0.5 tablets (12.5 mg total) by mouth every morning AND 1 tablet (25 mg total) every evening. 03/16/21   Deberah Pelton, NP  Multiple Vitamin (MULTIVITAMIN WITH MINERALS) TABS tablet Take 1 tablet by mouth daily.    [provider]  Multiple Vitamins-Minerals (CENTRUM SILVER 50+MEN) TABS Take by mouth.    [provider]    Family History    History reviewed. No pertinent family history. He indicated that his mother is deceased. He indicated that his father is deceased.  Social History    Social History   Socioeconomic History   Marital status: Single    Spouse name: Not on file   Number of children: Not on file   Years of education: Not on file   Highest education level: Not on file  Occupational History   Not on file  Tobacco Use   Smoking status: Never   Smokeless tobacco: Never  Substance and Sexual Activity   Alcohol use: Not Currently   Drug use: Not on file   Sexual activity: Not on file  Other Topics Concern   Not on file  Social History Narrative   Not on file   Social Determinants of Health   Financial Resource Strain: Not on file  Food Insecurity: Not on file  Transportation Needs: Not on file  Physical Activity: Not on file  Stress: Not on file  Social Connections: Not on file  Intimate Partner Violence: Not on file     Review of Systems    General:  No chills, fever, night sweats or weight changes.  Cardiovascular:  No chest pain, dyspnea on exertion, edema, orthopnea, palpitations, paroxysmal nocturnal dyspnea. Dermatological: No rash, lesions/masses Respiratory: No cough, dyspnea Urologic: No hematuria, dysuria Abdominal:   No nausea, vomiting, diarrhea, bright red blood per rectum,  melena, or hematemesis Neurologic:  No visual changes, wkns, changes in mental status. All other systems reviewed and are otherwise negative except as noted above.  Physical Exam    VS:  BP 126/80 (BP Location: Right Arm, Patient Position: Sitting, Cuff Size: Normal)   Pulse 60   Ht '6\' 1"'$  (1.854 m)   Wt 154 lb 9.6 oz (70.1 kg)   SpO2 98%   BMI 20.40 kg/m  , BMI Body mass index is 20.4 kg/m. GEN: Well nourished, well developed, in no acute distress. HEENT: normal. Neck: Supple, no JVD, carotid bruits, or masses. Cardiac: RRR, no murmurs, rubs, or gallops. No clubbing, cyanosis, edema.  Radials/DP/PT 2+ and equal bilaterally.  Respiratory:  Respirations regular and unlabored, clear to auscultation bilaterally. GI: Soft, nontender, nondistended, BS + x  4. MS: no deformity or atrophy. Skin: warm and dry, no rash. Neuro:  Strength and sensation are intact. Psych: Normal affect.  Accessory Clinical Findings    Recent Labs: 02/10/2021: ALT 20; BUN 19; Creatinine, Ser 1.94; Hemoglobin 12.6; Platelets 224.0; Potassium 4.5; Sodium 143; TSH 1.79   Recent Lipid Panel    Component Value Date/Time   CHOL 149 02/10/2021 0936   TRIG 79.0 02/10/2021 0936   HDL 43.50 02/10/2021 0936   CHOLHDL 3 02/10/2021 0936   VLDL 15.8 02/10/2021 0936   LDLCALC 90 02/10/2021 0936    ECG personally reviewed by me today-none today.  EKG 03/16/2021 Normal sinus rhythm with sinus arrhythmia 66 bpm- No acute changes   EKG 09/13/2020 Normal sinus rhythm no ST or T wave deviation   Echocardiogram 02/14/2020 IMPRESSIONS     1. Left ventricular ejection fraction, by estimation, is 55 to 60%. The  left ventricle has normal function. The left ventricle has no regional  wall motion abnormalities. There is severe left ventricular hypertrophy.  Left ventricular diastolic parameters   are consistent with Grade I diastolic dysfunction (impaired relaxation).   2. Right ventricular systolic function is normal.  The right ventricular  size is normal.   3. Left atrial size was mildly dilated.  Assessment & Plan   1.  Essential hypertension-BP today 126/80.  Well-controlled at home. Continue  amlodipine, hydralazine, Imdur Continue carvedilol to 12.5 mg a.m. and 25 mg p.m. Heart healthy low-sodium diet-salty 6 given Increase physical activity as tolerated Continue blood pressure log Blood pressure cuff provided   Chronic diastolic CHF-no increased DOE or activity intolerance.  Euvolemic today.  Echocardiogram showed LVEF 55-60% with G1 DD.  Left atrium mildly dilated. Continue carvedilol to 12 point 5 in the AM and 25 in p.m.,  Continue hydralazine, Imdur Heart healthy low-sodium diet-salty 6 given Increase physical activity as tolerated  Coronary artery disease-denies recent episodes of arm neck back or chest discomfort.  Previous NSTEMI managed medically. Continue amlodipine, carvedilol, aspirin, Imdur, hydralazine Heart healthy low-sodium diet-salty 6 given Increase physical activity as tolerated   CKD-creatinine 1.94 at baseline 02/10/2021 Follows with PCP   Disposition: Follow-up with Dr. Sophronia Simas in 6 months.   Jossie Ng. Harly Pipkins NP-C    06/01/2021, 2:12 PM [facility] [place] [address] Office [phone] Fax [phone]  Notice: This dictation was prepared with Dragon dictation along with smaller phrase technology. Any transcriptional errors that result from this process are unintentional and may not be corrected upon review.  I spent 13 minutes examining this patient, reviewing medications, and using patient centered shared decision making involving her cardiac care.  Prior to her visit I spent greater than 20 minutes reviewing her past medical history,  medications, and prior cardiac tests.

## 2021-06-01 ENCOUNTER — Encounter: Payer: Self-pay | Admitting: General Practice

## 2021-06-01 ENCOUNTER — Ambulatory Visit: Payer: Medicare Other | Admitting: General Practice

## 2021-06-01 ENCOUNTER — Other Ambulatory Visit: Payer: Self-pay

## 2021-06-01 VITALS — BP 126/80 | HR 60 | Ht 73.0 in | Wt 154.6 lb

## 2021-06-01 DIAGNOSIS — I2510 Atherosclerotic heart disease of native coronary artery without angina pectoris: Secondary | ICD-10-CM

## 2021-06-01 DIAGNOSIS — I10 Essential (primary) hypertension: Secondary | ICD-10-CM

## 2021-06-01 DIAGNOSIS — I5032 Chronic diastolic (congestive) heart failure: Secondary | ICD-10-CM

## 2021-06-01 DIAGNOSIS — N184 Chronic kidney disease, stage 4 (severe): Secondary | ICD-10-CM

## 2021-06-01 NOTE — Patient Instructions (Signed)
Medication Instructions:  The current medical regimen is effective;  continue present plan and medications as directed. Please refer to the Current Medication list given to you today.  *If you need a refill on your cardiac medications before your next appointment, please call your pharmacy*  Lab Work:   Testing/Procedures:  NONE    NONE  Special Instructions PLEASE READ AND FOLLOW SALTY 6-ATTACHED-1,'800mg'$  daily  PLEASE INCREASE PHYSICAL ACTIVITY AS TOLERATED,   Follow-Up: Your next appointment:  6 month(s) In Person with  DR ARIDA   At [facility], you and your health needs are our priority.  As part of our continuing mission to provide you with exceptional heart care, we have created designated Provider Care Teams.  These Care Teams include your primary Cardiologist (physician) and Advanced Practice Providers (APPs -  Physician Assistants and Nurse Practitioners) who all work together to provide you with the care you need, when you need it.  We recommend signing up for the patient portal called "MyChart".  Sign up information is provided on this After Visit Summary.  MyChart is used to connect with patients for Virtual Visits (Telemedicine).  Patients are able to view lab/test results, encounter notes, upcoming appointments, etc.  Non-urgent messages can be sent to your provider as well.   To learn more about what you can do with MyChart, go to [email or web address].              6 SALTY THINGS TO AVOID     1,'800MG'$  DAILY

## 2021-07-12 ENCOUNTER — Telehealth: Payer: Self-pay | Admitting: Primary Care

## 2021-07-12 NOTE — Progress Notes (Signed)
  Chronic Care Management   Note  07/12/2021 Name: Michael Chang MRN: [id number] DOB: 21-Feb-1946  Michael Chang is a 75 y.o. year old male who is a primary care patient of [name], NP. I reached out to Michael Chang by phone today in response to a referral sent by Michael Chang's PCP, [name], NP.   Michael Chang was given information about Chronic Care Management services today including:  CCM service includes personalized support from designated clinical staff supervised by his physician, including individualized plan of care and coordination with other care providers 24/7 contact phone numbers for assistance for urgent and routine care needs. Service will only be billed when office clinical staff spend 20 minutes or more in a month to coordinate care. Only one practitioner may furnish and bill the service in a calendar month. The patient may stop CCM services at any time (effective at the end of the month) by phone call to the office staff.   Patient agreed to services and verbal consent obtained.   Follow up plan:   Tatjana Secretary/administrator

## 2021-07-12 NOTE — Chronic Care Management (AMB) (Signed)
  Chronic Care Management   Outreach Note  07/12/2021 Name: Michael Chang MRN: [id number] DOB: 07/08/1946  Referred by: [name], NP Reason for referral : No chief complaint on file.   An unsuccessful telephone outreach was attempted today. The patient was referred to the pharmacist for assistance with care management and care coordination.   Follow Up Plan:   Tatjana Dellinger Upstream Scheduler

## 2021-09-06 ENCOUNTER — Telehealth: Payer: Self-pay

## 2021-09-06 NOTE — Chronic Care Management (AMB) (Signed)
    Chronic Care Management Pharmacy Assistant   Name: Michael Chang  MRN: [id number] DOB: [birth year]  Michael Chang is an 75 y.o. year old male who presents for his initial CCM visit with the clinical pharmacist.  Reason for Encounter: Initial Questions   Conditions to be addressed/monitored: CAD, HTN, and CKD Stage not staged   Recent office visits:  02/10/21-PCP-[name],NP-Patient presented for AWV.Labs ordered,discussed vaccines and screenings,follow up with wound care, cardiology and nephrology. No mediction changes.  Recent consult visits:  06/01/21-Cardiology- Michael Chang presented for follow up hypertension.Recent labs reviewed,recent EKG reviewed,try to increase physical activity.Follow up 6 months,no medication changes 03/30/21-Optometry- no data found 03/16/21-Cardiology-Michael Cleaver,NP-Patient presented for follow up hypertension.No medication changes, encouraged to log BP.   Hospital visits:  None in previous 6 months  Medications: Outpatient Encounter Medications as of 09/06/2021  Medication Sig   amLODipine (NORVASC) 10 MG tablet Take 1 tablet (10 mg total) by mouth daily.   aspirin 81 MG EC tablet Take by mouth.   carvedilol (COREG) 25 MG tablet Take 0.5 tablets (12.5 mg total) by mouth every morning AND 1 tablet (25 mg total) every evening.   hydrALAZINE (APRESOLINE) 100 MG tablet TAKE 1 TABLET BY MOUTH  TWICE DAILY   isosorbide mononitrate (IMDUR) 30 MG 24 hr tablet TAKE 2 TABLETS BY MOUTH IN  THE MORNING AND TAKE  ONE-HALF TABLET BY MOUTH IN THE EVENING   Multiple Vitamin (MULTIVITAMIN WITH MINERALS) TABS tablet Take 1 tablet by mouth daily.   Multiple Vitamins-Minerals (CENTRUM SILVER 50+MEN) TABS Take by mouth.   No facility-administered encounter medications on file as of 09/06/2021.    No results found for: HGBA1C, MICROALBUR   BP Readings from Last 3 Encounters:  06/01/21 126/80  03/16/21 (!) 144/84  02/10/21 (!) 144/85    Patient  contacted to review initial questions prior to visit with Charlene Brooke.  Have you seen any other providers since your last visit with PCP? Yes Cardiology- Irven Shelling medication changes   Any changes in your medications or health? No  Any side effects from any medications? No  Do you have an symptoms or problems not managed by your medications? No  Any concerns about your health right now? No  Has your provider asked that you check blood pressure, blood sugar, or follow special diet at home? Yes Low salt, low sugar diet  Do you get any type of exercise on a regular basis? No The patient states he tries to walk around and stay moving.  Can you think of a goal you would like to reach for your health? No  Do you have any problems getting your medications? No   Is there anything that you would like to discuss during the appointment? No   Spoke with patient and reminded them to have all medications, supplements and any blood glucose and blood pressure readings available for review with pharmacist, at their telephone visit on 09/08/21 at 11:00am.   Star Rating Drugs:  Medication:  Last Fill: Day Supply No star medications identified  Carvedilol 25mg  04/23/21 90 Amlodipine 10mg  05/26/21 90 Hydralazine 100mg  05/31/21 90 Imdur 30mg   05/31/21 90  [facility]   Care Gaps: Annual wellness visit in last year? Yes Most Recent BP reading:126/80  60-P 06/01/21  Marjo Bicker, CPP notified  Avel Sensor, [place] Assistant [phone]  Total time spent for month CPA: 40 min.

## 2021-09-08 ENCOUNTER — Ambulatory Visit (INDEPENDENT_AMBULATORY_CARE_PROVIDER_SITE_OTHER): Payer: Medicare Other | Admitting: Pharmacist

## 2021-09-08 ENCOUNTER — Other Ambulatory Visit: Payer: Self-pay

## 2021-09-08 DIAGNOSIS — I509 Heart failure, unspecified: Secondary | ICD-10-CM

## 2021-09-08 DIAGNOSIS — I2510 Atherosclerotic heart disease of native coronary artery without angina pectoris: Secondary | ICD-10-CM

## 2021-09-08 DIAGNOSIS — I252 Old myocardial infarction: Secondary | ICD-10-CM

## 2021-09-08 DIAGNOSIS — I10 Essential (primary) hypertension: Secondary | ICD-10-CM

## 2021-09-08 DIAGNOSIS — N183 Chronic kidney disease, stage 3 unspecified: Secondary | ICD-10-CM

## 2021-09-08 NOTE — Progress Notes (Signed)
Chronic Care Management Pharmacy Note  09/13/2021 Name:  Michael Chang MRN:  [id number] DOB:  04/02/1946  Summary: -Pt reports BP at home is typically < 140/90, though not checking often -Pt is not on a statin. He had NSTEMI 11/2019 (medically managed) and has hx of PAD. LDL was 90. Pt would benefit from statin and will consider starting one -GFR is 33; pt has not seen nephrology (Dr Juleen China) since 03/2020  Recommendations/Changes made from today's visit: -Recommend atorvastatin 10 mg daily - pt will consider this and give answer at f/u in 1 month -Advised pt to follow up with nephrology - he indicated he would call for appt  Plan: -Pharmacist follow up televisit scheduled for 1 month   Subjective: Michael Chang is an 75 y.o. year old male who is a primary patient of [name], NP.  The CCM team was consulted for assistance with disease management and care coordination needs.    Engaged with patient by telephone for initial visit in response to provider referral for pharmacy case management and/or care coordination services.   Consent to Services:  The patient was given the following information about Chronic Care Management services today, agreed to services, and gave verbal consent: 1. CCM service includes personalized support from designated clinical staff supervised by the primary care provider, including individualized plan of care and coordination with other care providers 2. 24/7 contact phone numbers for assistance for urgent and routine care needs. 3. Service will only be billed when office clinical staff spend 20 minutes or more in a month to coordinate care. 4. Only one practitioner may furnish and bill the service in a calendar month. 5.The patient may stop CCM services at any time (effective at the end of the month) by phone call to the office staff. 6. The patient will be responsible for cost sharing (co-pay) of up to 20% of the service fee (after annual deductible is  met). Patient agreed to services and consent obtained.  Patient Care Team: [name], NP as PCP - General (Internal Medicine) Wellington Hampshire, MD as PCP - Cardiology (Cardiology) Charlton Haws, [facility] as Pharmacist (Pharmacist)  Patient moved to [facility] from [place] in 11/2019 to be closer to his sister. He was not taking care of himself in [place] and had been without medical care since 2007 prior to NSTEMI in 11/2019. He lives by himself now but sister is close by. He is doing better taking care of himself and manages his own medicines using a pill box.  Recent office visits: 02/10/21-PCP-[name],NP-Patient presented for AWV.Labs ordered,discussed vaccines and screenings,follow up with wound care, cardiology and nephrology. No mediction changes.  Recent consult visits: 06/01/21-Cardiology- [name] presented for follow up CHF, HTN. BP improved after carvedilol increased last time. F/U 6 months. 03/30/21-Optometry- no data found 03/16/21-Cardiology-[name],NP-Patient presented for follow up hypertension, CHF. Increased carvedilol to 25 mg at night. Keep BP log. F/u 2 months.   Hospital visits: None in previous 6 months   Objective:  Lab Results  Component Value Date   CREATININE 1.94 (H) 02/10/2021   BUN 19 02/10/2021   GFR 33.39 (L) 02/10/2021   GFRNONAA 31 (L) 02/15/2020   GFRAA 36 (L) 02/15/2020   NA 143 02/10/2021   K 4.5 02/10/2021   CALCIUM 9.5 02/10/2021   CO2 28 02/10/2021   GLUCOSE 92 02/10/2021    Lab Results  Component Value Date/Time   GFR 33.39 (L) 02/10/2021 09:36 AM   GFR 39.07 (L)  01/05/2020 10:28 AM    Last diabetic Eye exam: No results found for: HMDIABEYEEXA  Last diabetic Foot exam: No results found for: HMDIABFOOTEX   Lab Results  Component Value Date   CHOL 149 02/10/2021   HDL 43.50 02/10/2021   LDLCALC 90 02/10/2021   TRIG 79.0 02/10/2021   CHOLHDL 3 02/10/2021    Hepatic Function Latest Ref Rng & Units 02/10/2021  02/14/2020 01/05/2020  Total Protein 6.0 - 8.3 g/dL 8.2 8.3(H) 7.9  Albumin 3.5 - 5.2 g/dL 4.1 2.9(L) 3.0(L)  AST 0 - 37 U/L _0 ALT 0 - 53 U/L _1 Alk Phosphatase 39 - 117 U/L 62 51 55  Total Bilirubin 0.2 - 1.2 mg/dL 0.5 0.6 0.3    Lab Results  Component Value Date/Time   TSH 1.79 02/10/2021 09:36 AM   TSH 4.17 01/05/2020 10:28 AM    CBC Latest Ref Rng & Units 02/10/2021 02/18/2020 02/15/2020  WBC 4.0 - 10.5 K/uL 4.3 6.0 4.7  Hemoglobin 13.0 - 17.0 g/dL 12.6(L) 9.2(L) 7.9(L)  Hematocrit 39.0 - 52.0 % 38.1(L) 28.3(L) 25.3(L)  Platelets 150.0 - 400.0 K/uL 224.0 278.0 254    No results found for: VD25OH  Clinical ASCVD: Yes  The ASCVD Risk score (Arnett DK, et al., 2019) failed to calculate for the following reasons:   The patient has a prior MI or stroke diagnosis    Depression screen [facility] 2/9 02/10/2021 02/17/2020  Decreased Interest 0 0  Down, Depressed, Hopeless 0 0  PHQ - 2 Score 0 0  Altered sleeping 0 0  Tired, decreased energy 0 0  Change in appetite 0 0  Feeling bad or failure about yourself  0 0  Trouble concentrating 0 0  Moving slowly or fidgety/restless 0 0  Suicidal thoughts 0 0  PHQ-9 Score 0 0  Difficult doing work/chores Not difficult at all Not difficult at all     Social History   Tobacco Use  Smoking Status Never  Smokeless Tobacco Never   BP Readings from Last 3 Encounters:  06/01/21 126/80  03/16/21 (!) 144/84  02/10/21 (!) 144/85   Pulse Readings from Last 3 Encounters:  06/01/21 60  03/16/21 66  02/10/21 61   Wt Readings from Last 3 Encounters:  06/01/21 154 lb 9.6 oz (70.1 kg)  03/16/21 156 lb 12.8 oz (71.1 kg)  02/10/21 155 lb (70.3 kg)   BMI Readings from Last 3 Encounters:  06/01/21 20.40 kg/m  03/16/21 20.41 kg/m  02/10/21 20.17 kg/m    Assessment/Interventions: Review of patient past medical history, allergies, medications, health status, including review of consultants reports, laboratory and other test data,  was performed as part of comprehensive evaluation and provision of chronic care management services.   SDOH:  (Social Determinants of Health) assessments and interventions performed: Yes  SDOH Screenings   Alcohol Screen: Not on file  Depression (PHQ2-9): Low Risk    PHQ-2 Score: 0  Financial Resource Strain: Not on file  Food Insecurity: Not on file  Housing: Not on file  Physical Activity: Not on file  Social Connections: Not on file  Stress: Not on file  Tobacco Use: Low Risk    Smoking Tobacco Use: Never   Smokeless Tobacco Use: Never   Passive Exposure: Not on file  Transportation Needs: Not on file    [place]  No Known Allergies  Medications Reviewed Today     Reviewed by Charlton Haws, [facility] (Pharmacist) on 09/08/21 at 1128  Med List Status: <None>   Medication Order Taking? Sig Documenting Provider Last Dose Status Informant  amLODipine (NORVASC) 10 MG tablet [id number] Yes Take 1 tablet (10 mg total) by mouth daily. Deberah Pelton, NP Taking Active   aspirin 81 MG EC tablet [id number] Yes Take by mouth. [provider] Taking Active   carvedilol (COREG) 25 MG tablet [id number] Yes Take 0.5 tablets (12.5 mg total) by mouth every morning AND 1 tablet (25 mg total) every evening. Deberah Pelton, NP Taking Active   hydrALAZINE (APRESOLINE) 100 MG tablet [id number] Yes TAKE 1 TABLET BY MOUTH  TWICE DAILY Wellington Hampshire, MD Taking Active   isosorbide mononitrate (IMDUR) 30 MG 24 hr tablet [id number] Yes TAKE 2 TABLETS BY MOUTH IN  THE MORNING AND TAKE  ONE-HALF TABLET BY MOUTH IN THE Gwenlyn Fudge, MD Taking Active   Multiple Vitamins-Minerals (CENTRUM SILVER 50+MEN) TABS [id number] Yes Take by mouth. [provider] Taking Active             Patient Active Problem List   Diagnosis Date Noted   Preventative health care 02/10/2021   Follow-up examination after eye surgery 03/22/2020   Retinal holes, right 03/19/2020    Secondary corneal edema, right 03/15/2020   Aphakia of eye, right 03/15/2020   Cataract (lens) fragments in eye following cataract surgery, right eye 03/15/2020   Anemia in chronic kidney disease 03/02/2020   Benign hypertensive kidney disease with chronic kidney disease 03/02/2020   Proteinuria 03/02/2020   Iron deficiency anemia    Weakness    Near syncope 02/14/2020   Cardiomyopathy, ischemic 02/14/2020   Skin ulcers of foot, bilateral ([place]) 01/07/2020   CAD (coronary artery disease) 01/07/2020   History of non-ST elevation myocardial infarction (NSTEMI) 01/05/2020   Congestive heart failure ([place]) 01/05/2020   PAD (peripheral artery disease) ([place]) 01/05/2020   Chronic ulcer of right lower extremity with fat layer exposed ([place]) 01/05/2020   Chronic kidney disease 01/05/2020   Essential hypertension 01/05/2020   Elevated TSH 01/05/2020     There is no immunization history on file for this patient.  Conditions to be addressed/monitored:  Hypertension, Hyperlipidemia, Heart Failure, Coronary Artery Disease, and Chronic Kidney Disease  Care Plan : CCM Pharmacy Care Plan  Updates made by Charlton Haws, [place] since 09/13/2021 12:00 AM     Problem: Hypertension, Hyperlipidemia, Heart Failure, Coronary Artery Disease, and Chronic Kidney Disease   Priority: High     Long-Range Goal: Disease mgmt   Start Date: 09/13/2021  Expected End Date: 09/13/2022  This Visit's Progress: On track  Priority: High  Note:   Current Barriers:  Unable to independently monitor therapeutic efficacy Suboptimal therapeutic regimen for CAD  Pharmacist Clinical Goal(s):  Patient will achieve adherence to monitoring guidelines and medication adherence to achieve therapeutic efficacy adhere to plan to optimize therapeutic regimen for CAD as evidenced by report of adherence to recommended medication management changes through collaboration with PharmD and provider.   Interventions: 1:1  collaboration with [name], NP regarding development and update of comprehensive plan of care as evidenced by provider attestation and co-signature Inter-disciplinary care team collaboration (see longitudinal plan of care) Comprehensive medication review performed; medication list updated in electronic medical record  Hypertension / CKD Stage 3b (BP goal <130/80) -Controlled - pt reports BP is improved with current regimen; he is not checking BP often but typically when he checks it is 130s/80s; he reports he feels better when BP is somewhat  higher, he gets dizzy/weak when BP is ~120/70 -saw Dr Juleen China (nephrology) once 03/2020 -Current treatment: Amlodipine 10 mg daily AM Carvedilol 25 mg - 1/2 tab AM, 1 tab PM Hydralazine 100 mg BID Isosorbide MN 30 mg - 2 tab AM, 1/2 tab PM -Educated on BP goals and benefits of medications for prevention of heart attack, stroke and kidney damage; Daily salt intake goal < 2300 mg; Exercise goal of 150 minutes per week; Importance of home blood pressure monitoring; -Counseled to monitor BP at home periodically -Recommend to continue current medication  Hyperlipidemia / CAD / PAD (LDL goal < 70) -Not ideally controlled - LDL is above goal; pt declined statin trial 12/2019; pt would benefit from statin given CAD/PAD -Hx NSTEMI (11/2019 in [place]?) medically managed; hx PAD w/ chronic leg ulcer -Current treatment: Aspirin 81 mg daily -Educated on Cholesterol goals; Benefits of statin for ASCVD risk reduction; pt will consider statin trial and discuss with his sister -Recommend atorvastatin 10 mg daily; pt will think about this and give answer at f/u in 1 month  Heart Failure / Hypertension (Goal: BP < 140/90, prevent exacerbations) -Controlled - pt denies swelling; BP is at goal at home, though not checking often -Last ejection fraction: 55-60% (Date: 02/14/20) -HF type: Diastolic (Grade I dysfunction) -NYHA Class: I (no actitivty limitation) -Current  treatment: Amlodipine 10 mg daily Carvedilol 25 mg - 1/2 tab AM, 1 tab PM Hydralazine 100 mg BID Isosorbide MN 30 mg - 2 tab AM, 1/2 tab PM -Educated on Benefits of medications for managing symptoms and prolonging life -Recommend to continue current medication  CKD Stage 3b (Goal: slow progression) -Not ideally controlled - pt last saw Dr Abigail Butts 03/2020, overdue for follow up -Discussed importance of BP control and hydration to avoid further kidney damage; advised to avoid NSAIDs -Advised pt to make follow up appt with nephrologist; pt indicated he would call for appt  Health Maintenance -Vaccine gaps: Prevnar, Flu, Shingrix, TDAP -pt has declined vaccines previously, "Not a vaccine person"  -Current therapy:  Multivitamin -Patient is satisfied with current therapy and denies issues -Recommended to continue current medication  Patient Goals/Self-Care Activities Patient will:  - take medications as prescribed as evidenced by patient report and record review focus on medication adherence by routine check blood pressure peroidically, document, and provide at future appointments -Consider statin drug for cholesterol/heart disease -Call nephrology for follow up appt      Medication Assistance: None required.  Patient affirms current coverage meets needs.  Compliance/Adherence/Medication fill history: Care Gaps: None  Star-Rating Drugs: None  Patient's preferred pharmacy is:  [facility], [address] [place] [address] Phone: [phone] Fax: [phone]  [facility] ([place], [place] [facility] [address] [place] [address] Phone: [phone] Fax: [phone]  [facility] ([facility] ) - [place], [address] [address] Phone: [phone] Fax: [phone]  Uses pill box?  Yes Pt endorses 100% compliance  We discussed: Current pharmacy is preferred with insurance plan and patient is satisfied with pharmacy services Patient decided to: Continue current medication management strategy  Care Plan and Follow Up Patient Decision:  Patient agrees to Care Plan and Follow-up.  Plan: Telephone follow up appointment with care management team member scheduled for:  1 month  Charlene Brooke, PharmD, [facility] Clinical Pharmacist [place]  Primary Care at [place]

## 2021-09-13 NOTE — Patient Instructions (Addendum)
Visit Information  Phone number for Pharmacist: [phone]  Thank you for meeting with me to discuss your medications! I look forward to working with you to achieve your health care goals. Below is a summary of what we talked about during the visit:   Goals Addressed             This Visit's Progress    Manage My Medicine       Timeframe:  Long-Range Goal Priority:  High Start Date:    09/13/21                         Expected End Date:    09/13/22                   Follow Up Date Jan 2023   - call for medicine refill 2 or 3 days before it runs out - call if I am sick and can't take my medicine - keep a list of all the medicines I take; vitamins and herbals too -Consider statin drug for cholesterol/heart disease -Call nephrology for follow up appt     Why is this important?   These steps will help you keep on track with your medicines.   Notes:         Care Plan : [place]  Updates made by Charlton Haws, RPH since 09/13/2021 12:00 AM     Problem: Hypertension, Hyperlipidemia, Heart Failure, Coronary Artery Disease, and Chronic Kidney Disease   Priority: High     Long-Range Goal: Disease mgmt   Start Date: 09/13/2021  Expected End Date: 09/13/2022  This Visit's Progress: On track  Priority: High  Note:   Current Barriers:  Unable to independently monitor therapeutic efficacy Suboptimal therapeutic regimen for CAD  Pharmacist Clinical Goal(s):  Patient will achieve adherence to monitoring guidelines and medication adherence to achieve therapeutic efficacy adhere to plan to optimize therapeutic regimen for CAD as evidenced by report of adherence to recommended medication management changes through collaboration with PharmD and provider.   Interventions: 1:1 collaboration with [name], NP regarding development and update of comprehensive plan of care as evidenced by provider attestation and co-signature Inter-disciplinary care  team collaboration (see longitudinal plan of care) Comprehensive medication review performed; medication list updated in electronic medical record  Hypertension / CKD Stage 3b (BP goal <130/80) -Controlled - pt reports BP is improved with current regimen; he is not checking BP often but typically when he checks it is 130s/80s; he reports he feels better when BP is somewhat higher, he gets dizzy/weak when BP is ~120/70 -saw Dr Juleen China (nephrology) once 03/2020 -Current treatment: Amlodipine 10 mg daily AM Carvedilol 25 mg - 1/2 tab AM, 1 tab PM Hydralazine 100 mg BID Isosorbide MN 30 mg - 2 tab AM, 1/2 tab PM -Educated on BP goals and benefits of medications for prevention of heart attack, stroke and kidney damage; Daily salt intake goal < 2300 mg; Exercise goal of 150 minutes per week; Importance of home blood pressure monitoring; -Counseled to monitor BP at home periodically -Recommend to continue current medication  Hyperlipidemia / CAD / PAD (LDL goal < 70) -Not ideally controlled - LDL is above goal; pt declined statin trial 12/2019; pt would benefit from statin given CAD/PAD -Hx NSTEMI (11/2019 in [place]?) medically managed; hx PAD w/ chronic leg ulcer -Current treatment: Aspirin 81 mg daily -Educated on Cholesterol goals; Benefits of statin for ASCVD risk reduction; pt  will consider statin trial and discuss with his sister -Recommend atorvastatin 10 mg daily; pt will think about this and give answer at f/u in 1 month  Heart Failure / Hypertension (Goal: BP < 140/90, prevent exacerbations) -Controlled - pt denies swelling; BP is at goal at home, though not checking often -Last ejection fraction: 55-60% (Date: 02/14/20) -HF type: Diastolic (Grade I dysfunction) -NYHA Class: I (no actitivty limitation) -Current treatment: Amlodipine 10 mg daily Carvedilol 25 mg - 1/2 tab AM, 1 tab PM Hydralazine 100 mg BID Isosorbide MN 30 mg - 2 tab AM, 1/2 tab PM -Educated on Benefits of medications for  managing symptoms and prolonging life -Recommend to continue current medication  CKD Stage 3b (Goal: slow progression) -Not ideally controlled - pt last saw Dr Abigail Butts 03/2020, overdue for follow up -Discussed importance of BP control and hydration to avoid further kidney damage; advised to avoid NSAIDs -Advised pt to make follow up appt with nephrologist; pt indicated he would call for appt  Health Maintenance -Vaccine gaps: Prevnar, Flu, Shingrix, TDAP -pt has declined vaccines previously, "Not a vaccine person"  -Current therapy:  Multivitamin -Patient is satisfied with current therapy and denies issues -Recommended to continue current medication  Patient Goals/Self-Care Activities Patient will:  - take medications as prescribed as evidenced by patient report and record review focus on medication adherence by routine check blood pressure peroidically, document, and provide at future appointments -Consider statin drug for cholesterol/heart disease -Call nephrology for follow up appt      Mr. Jayson was given information about Chronic Care Management services today including:  CCM service includes personalized support from designated clinical staff supervised by his physician, including individualized plan of care and coordination with other care providers 24/7 contact phone numbers for assistance for urgent and routine care needs. Standard insurance, coinsurance, copays and deductibles apply for chronic care management only during months in which we provide at least 20 minutes of these services. Most insurances cover these services at 100%, however patients may be responsible for any copay, coinsurance and/or deductible if applicable. This service may help you avoid the need for more expensive face-to-face services. Only one practitioner may furnish and bill the service in a calendar month. The patient may stop CCM services at any time (effective at the end of the month) by phone call  to the office staff.  Patient agreed to services and verbal consent obtained.   The patient verbalized understanding of instructions, educational materials, and care plan provided today and declined offer to receive copy of patient instructions, educational materials, and care plan.  Telephone follow up appointment with pharmacy team member scheduled for: 1 month  Charlene Brooke, PharmD, [facility] Clinical Pharmacist [place] [facility] at [facility] [phone]

## 2021-09-30 DIAGNOSIS — I252 Old myocardial infarction: Secondary | ICD-10-CM | POA: Diagnosis not present

## 2021-09-30 DIAGNOSIS — I10 Essential (primary) hypertension: Secondary | ICD-10-CM | POA: Diagnosis not present

## 2021-09-30 DIAGNOSIS — I509 Heart failure, unspecified: Secondary | ICD-10-CM | POA: Diagnosis not present

## 2021-09-30 DIAGNOSIS — N183 Chronic kidney disease, stage 3 unspecified: Secondary | ICD-10-CM

## 2021-09-30 DIAGNOSIS — I2510 Atherosclerotic heart disease of native coronary artery without angina pectoris: Secondary | ICD-10-CM

## 2021-10-05 ENCOUNTER — Telehealth: Payer: Self-pay

## 2021-10-05 NOTE — Progress Notes (Signed)
° ° °  Chronic Care Management Pharmacy Assistant   Name: Michael Chang  MRN: [id number] DOB: Apr 09, 1946  Reason for Encounter: CCM (Appointment Reminder)   Medications: Outpatient Encounter Medications as of 10/05/2021  Medication Sig   amLODipine (NORVASC) 10 MG tablet Take 1 tablet (10 mg total) by mouth daily.   aspirin 81 MG EC tablet Take by mouth.   carvedilol (COREG) 25 MG tablet Take 0.5 tablets (12.5 mg total) by mouth every morning AND 1 tablet (25 mg total) every evening.   hydrALAZINE (APRESOLINE) 100 MG tablet TAKE 1 TABLET BY MOUTH  TWICE DAILY   isosorbide mononitrate (IMDUR) 30 MG 24 hr tablet TAKE 2 TABLETS BY MOUTH IN  THE MORNING AND TAKE  ONE-HALF TABLET BY MOUTH IN THE EVENING   Multiple Vitamins-Minerals (CENTRUM SILVER 50+MEN) TABS Take by mouth.   No facility-administered encounter medications on file as of 10/05/2021.   Michael Chang was contacted to remind him of his upcoming telephone visit with Charlene Brooke on 10/10/2020 at 3:30 pm. Patient was reminded to have all medications, supplements and any blood glucose and blood pressure readings available for review at appointment.  Are you having any problems with your medications? No  Do you have any concerns you like to discuss with the pharmacist? No  Star Rating Drugs: Medication:  Last Fill: Day Supply No star medications identified   Carvedilol 25mg           04/23/21            90 Amlodipine 10mg         05/26/21            90 Hydralazine 100mg      05/31/21            90 Imdur 30mg                  05/31/21            90   [facility]  Charlene Brooke, CPP notified  Marijean Niemann, [place]  Time Spent:  10 Minutes

## 2021-10-10 ENCOUNTER — Other Ambulatory Visit: Payer: Self-pay

## 2021-10-10 ENCOUNTER — Ambulatory Visit (INDEPENDENT_AMBULATORY_CARE_PROVIDER_SITE_OTHER): Payer: Medicare Other | Admitting: Pharmacist

## 2021-10-10 ENCOUNTER — Telehealth: Payer: Self-pay | Admitting: Pharmacist

## 2021-10-10 DIAGNOSIS — I10 Essential (primary) hypertension: Secondary | ICD-10-CM

## 2021-10-10 DIAGNOSIS — I2510 Atherosclerotic heart disease of native coronary artery without angina pectoris: Secondary | ICD-10-CM

## 2021-10-10 DIAGNOSIS — I5022 Chronic systolic (congestive) heart failure: Secondary | ICD-10-CM

## 2021-10-10 DIAGNOSIS — N183 Chronic kidney disease, stage 3 unspecified: Secondary | ICD-10-CM

## 2021-10-10 DIAGNOSIS — I739 Peripheral vascular disease, unspecified: Secondary | ICD-10-CM

## 2021-10-10 DIAGNOSIS — I252 Old myocardial infarction: Secondary | ICD-10-CM

## 2021-10-10 NOTE — Progress Notes (Signed)
Chronic Care Management Pharmacy Note  10/10/2021 Name:  Michael Chang MRN:  [id number] DOB:  18-May-1946  Summary: -Pt is not on a statin. Pt will benefit from statin given hx NSTEMI 11/2019 (medically managed) and has hx of PAD. LDL was 90 at last check (01/2021). Pt agreed to start a low-dose statin. -GFR is 33; pt has not seen nephrology (Dr Juleen China) since 03/2020  Recommendations/Changes made from today's visit: -Recommend atorvastatin 10 mg daily and repeat lipid panel in 2 months -Advised pt to follow up with nephrology - he indicated he would call for appt -Advised PCP annual visit is due May 2023, pt indicated he would call to schedule appt  Plan: -[place] will call patient 1 month for tolerability -Pharmacist follow up televisit scheduled for 1 month   Subjective: Michael Chang is an 76 y.o. year old male who is a primary patient of Michael Koch, NP.  The CCM team was consulted for assistance with disease management and care coordination needs.    Engaged with patient by telephone for follow up visit in response to provider referral for pharmacy case management and/or care coordination services.   Consent to Services:  The patient was given information about Chronic Care Management services, agreed to services, and gave verbal consent prior to initiation of services.  Please see initial visit note for detailed documentation.   Patient Care Team: Michael Koch, NP as PCP - General (Internal Medicine) Wellington Hampshire, MD as PCP - Cardiology (Cardiology) Charlton Haws, [facility] as Pharmacist (Pharmacist)  Patient moved to [facility] from [place] in 11/2019 to be closer to his sister. He was not taking care of himself in [place] and had been without medical care since 2007 prior to NSTEMI in 11/2019. He lives by himself now but sister is close by. He is doing better taking care of himself and manages his own medicines using a pill box.  Recent office  visits: 02/10/21-PCP-[name],NP-Patient presented for AWV.Labs ordered,discussed vaccines and screenings,follow up with wound care, cardiology and nephrology. No mediction changes.  Recent consult visits: 06/01/21-Cardiology- [name] presented for follow up CHF, HTN. BP improved after carvedilol increased last time. F/U 6 months. 03/30/21-Optometry- no data found 03/16/21-Cardiology-[name],NP-Patient presented for follow up hypertension, CHF. Increased carvedilol to 25 mg at night. Keep BP log. F/u 2 months.   Hospital visits: None in previous 6 months   Objective:  Lab Results  Component Value Date   CREATININE 1.94 (H) 02/10/2021   BUN 19 02/10/2021   GFR 33.39 (L) 02/10/2021   GFRNONAA 31 (L) 02/15/2020   GFRAA 36 (L) 02/15/2020   NA 143 02/10/2021   K 4.5 02/10/2021   CALCIUM 9.5 02/10/2021   CO2 28 02/10/2021   GLUCOSE 92 02/10/2021    Lab Results  Component Value Date/Time   GFR 33.39 (L) 02/10/2021 09:36 AM   GFR 39.07 (L) 01/05/2020 10:28 AM    Last diabetic Eye exam: No results found for: HMDIABEYEEXA  Last diabetic Foot exam: No results found for: HMDIABFOOTEX   Lab Results  Component Value Date   CHOL 149 02/10/2021   HDL 43.50 02/10/2021   LDLCALC 90 02/10/2021   TRIG 79.0 02/10/2021   CHOLHDL 3 02/10/2021    Hepatic Function Latest Ref Rng & Units 02/10/2021 02/14/2020 01/05/2020  Total Protein 6.0 - 8.3 g/dL 8.2 8.3(H) 7.9  Albumin 3.5 - 5.2 g/dL 4.1 2.9(L) 3.0(L)  AST 0 - 37 U/L 23 29 22   ALT 0 -  53 U/L 20 16 14   Alk Phosphatase 39 - 117 U/L 62 51 55  Total Bilirubin 0.2 - 1.2 mg/dL 0.5 0.6 0.3    Lab Results  Component Value Date/Time   TSH 1.79 02/10/2021 09:36 AM   TSH 4.17 01/05/2020 10:28 AM    CBC Latest Ref Rng & Units 02/10/2021 02/18/2020 02/15/2020  WBC 4.0 - 10.5 K/uL 4.3 6.0 4.7  Hemoglobin 13.0 - 17.0 g/dL 12.6(L) 9.2(L) 7.9(L)  Hematocrit 39.0 - 52.0 % 38.1(L) 28.3(L) 25.3(L)  Platelets 150.0 - 400.0 K/uL  224.0 278.0 254    No results found for: VD25OH  Clinical ASCVD: Yes  The ASCVD Risk score (Arnett DK, et al., 2019) failed to calculate for the following reasons:   The patient has a prior MI or stroke diagnosis    Depression screen [facility] 2/9 02/10/2021 02/17/2020  Decreased Interest 0 0  Down, Depressed, Hopeless 0 0  PHQ - 2 Score 0 0  Altered sleeping 0 0  Tired, decreased energy 0 0  Change in appetite 0 0  Feeling bad or failure about yourself  0 0  Trouble concentrating 0 0  Moving slowly or fidgety/restless 0 0  Suicidal thoughts 0 0  PHQ-9 Score 0 0  Difficult doing work/chores Not difficult at all Not difficult at all     Social History   Tobacco Use  Smoking Status Never  Smokeless Tobacco Never   BP Readings from Last 3 Encounters:  06/01/21 126/80  03/16/21 (!) 144/84  02/10/21 (!) 144/85   Pulse Readings from Last 3 Encounters:  06/01/21 60  03/16/21 66  02/10/21 61   Wt Readings from Last 3 Encounters:  06/01/21 154 lb 9.6 oz (70.1 kg)  03/16/21 156 lb 12.8 oz (71.1 kg)  02/10/21 155 lb (70.3 kg)   BMI Readings from Last 3 Encounters:  06/01/21 20.40 kg/m  03/16/21 20.41 kg/m  02/10/21 20.17 kg/m    Assessment/Interventions: Review of patient past medical history, allergies, medications, health status, including review of consultants reports, laboratory and other test data, was performed as part of comprehensive evaluation and provision of chronic care management services.   SDOH:  (Social Determinants of Health) assessments and interventions performed: Yes SDOH Interventions    Flowsheet Row Most Recent Value  SDOH Interventions   Financial Strain Interventions Intervention Not Indicated      SDOH Screenings   Alcohol Screen: Not on file  Depression (PHQ2-9): Low Risk    PHQ-2 Score: 0  Financial Resource Strain: Low Risk    Difficulty of Paying Living Expenses: Not very hard  Food Insecurity: Not on file  Housing: Not on file   Physical Activity: Not on file  Social Connections: Not on file  Stress: Not on file  Tobacco Use: Low Risk    Smoking Tobacco Use: Never   Smokeless Tobacco Use: Never   Passive Exposure: Not on file  Transportation Needs: Not on file    [place]  No Known Allergies  Medications Reviewed Today     Reviewed by Charlton Haws, [facility] (Pharmacist) on 10/10/21 at [place] List Status: <None>   Medication Order Taking? Sig Documenting Provider Last Dose Status Informant  amLODipine (NORVASC) 10 MG tablet [id number] Yes Take 1 tablet (10 mg total) by mouth daily. Deberah Pelton, NP Taking Active   aspirin 81 MG EC tablet [id number] Yes Take by mouth. [provider] Taking Active   carvedilol (COREG) 25 MG tablet [id number] Yes Take 0.5  tablets (12.5 mg total) by mouth every morning AND 1 tablet (25 mg total) every evening. Deberah Pelton, NP Taking Active   hydrALAZINE (APRESOLINE) 100 MG tablet [id number] Yes TAKE 1 TABLET BY MOUTH  TWICE DAILY Wellington Hampshire, MD Taking Active   isosorbide mononitrate (IMDUR) 30 MG 24 hr tablet [id number] Yes TAKE 2 TABLETS BY MOUTH IN  THE MORNING AND TAKE  ONE-HALF TABLET BY MOUTH IN THE Gwenlyn Fudge, MD Taking Active   Multiple Vitamins-Minerals (CENTRUM SILVER 50+MEN) TABS [id number] Yes Take by mouth. [provider] Taking Active             Patient Active Problem List   Diagnosis Date Noted   Preventative health care 02/10/2021   Follow-up examination after eye surgery 03/22/2020   Retinal holes, right 03/19/2020   Secondary corneal edema, right 03/15/2020   Aphakia of eye, right 03/15/2020   Cataract (lens) fragments in eye following cataract surgery, right eye 03/15/2020   Anemia in chronic kidney disease 03/02/2020   Benign hypertensive kidney disease with chronic kidney disease 03/02/2020   Proteinuria 03/02/2020   Iron deficiency anemia    Weakness    Near syncope 02/14/2020    Cardiomyopathy, ischemic 02/14/2020   Skin ulcers of foot, bilateral ([place]) 01/07/2020   CAD (coronary artery disease) 01/07/2020   History of non-ST elevation myocardial infarction (NSTEMI) 01/05/2020   Congestive heart failure ([place]) 01/05/2020   PAD (peripheral artery disease) ([place]) 01/05/2020   Chronic ulcer of right lower extremity with fat layer exposed ([place]) 01/05/2020   Chronic kidney disease 01/05/2020   Essential hypertension 01/05/2020   Elevated TSH 01/05/2020     There is no immunization history on file for this patient.  Conditions to be addressed/monitored:  Hypertension, Hyperlipidemia, Heart Failure, Coronary Artery Disease, and Chronic Kidney Disease  Care Plan : CCM Pharmacy Care Plan  Updates made by Charlton Haws, RPH since 10/10/2021 12:00 AM     Problem: Hypertension, Hyperlipidemia, Heart Failure, Coronary Artery Disease, and Chronic Kidney Disease   Priority: High     Long-Range Goal: Disease mgmt   Start Date: 09/13/2021  Expected End Date: 09/13/2022  This Visit's Progress: On track  Recent Progress: On track  Priority: High  Note:   Current Barriers:  Unable to independently monitor therapeutic efficacy Suboptimal therapeutic regimen for CAD  Pharmacist Clinical Goal(s):  Patient will achieve adherence to monitoring guidelines and medication adherence to achieve therapeutic efficacy adhere to plan to optimize therapeutic regimen for CAD as evidenced by report of adherence to recommended medication management changes through collaboration with PharmD and provider.   Interventions: 1:1 collaboration with Michael Koch, NP regarding development and update of comprehensive plan of care as evidenced by provider attestation and co-signature Inter-disciplinary care team collaboration (see longitudinal plan of care) Comprehensive medication review performed; medication list updated in electronic medical record  Hypertension (BP goal  <130/80) -Controlled - pt reports BP is improved with current regimen; he is not checking BP often but typically when he checks it is 130s/80s; he reports he feels better when BP is somewhat higher, he gets dizzy/weak when BP is ~120/70 -Current treatment: Amlodipine 10 mg daily AM Carvedilol 25 mg - 1/2 tab AM, 1 tab PM Hydralazine 100 mg BID Isosorbide MN 30 mg - 2 tab AM, 1/2 tab PM -Educated on BP goals and benefits of medications for prevention of heart attack, stroke and kidney damage; Daily salt intake goal < 2300 mg; Exercise goal  of 150 minutes per week; Importance of home blood pressure monitoring; -Counseled to monitor BP at home periodically -Recommend to continue current medication  Hyperlipidemia / CAD / PAD (LDL goal < 70) -Not ideally controlled - LDL is above goal; pt declined statin trial 12/2019; pt would benefit from statin given CAD/PAD; he has thought about statin and agrees to start low dose-statin today -Hx NSTEMI (11/2019 in [place]?) medically managed; hx PAD w/ chronic leg ulcer -Current treatment: Aspirin 81 mg daily -Educated on Cholesterol goals; Benefits of statin for ASCVD risk reduction;  -Recommend atorvastatin 10 mg daily and repeat lipid panel in 2 months  Heart Failure (Goal: prevent exacerbations) -Controlled - pt denies swelling; BP is at goal at home, though not checking often -Last ejection fraction: 55-60% (Date: 02/14/20) -HF type: Diastolic (Grade I dysfunction) -NYHA Class: I (no actitivty limitation) -Current treatment: Amlodipine 10 mg daily Carvedilol 25 mg - 1/2 tab AM, 1 tab PM Hydralazine 100 mg BID Isosorbide MN 30 mg - 2 tab AM, 1/2 tab PM -Educated on Benefits of medications for managing symptoms and prolonging life -Recommend to continue current medication  CKD Stage 3b (Goal: slow progression) -Not ideally controlled - pt last saw Dr Abigail Butts 03/2020, overdue for follow up -Discussed importance of BP control and hydration to avoid  further kidney damage; advised to avoid NSAIDs -Advised pt to make follow up appt with nephrologist; pt indicated he would call for appt  Health Maintenance -Vaccine gaps: Prevnar, Flu, Shingrix, TDAP -pt has declined vaccines previously, "Not a vaccine person"  -Pt is due for annual visit with PCP May 2023. He indicated he would call to make appt.  Patient Goals/Self-Care Activities Patient will:  - take medications as prescribed as evidenced by patient report and record review focus on medication adherence by routine check blood pressure peroidically, document, and provide at future appointments -Call nephrology for follow up appt      Medication Assistance: None required.  Patient affirms current coverage meets needs.  Compliance/Adherence/Medication fill history: Care Gaps: None  Star-Rating Drugs: None  Patient's preferred pharmacy is:  [facility] [address] [place] [address] Phone: [phone] Fax: [phone]  [facility] ([place], [place] [facility] [address] [place] [address] Phone: [phone] Fax: [phone]  [facility] ([facility] ) - [place], [place] [address] Phone: [phone] Fax: [phone]   Uses pill box? Yes Pt endorses 100% compliance  We discussed: Current pharmacy is preferred with insurance plan and patient is satisfied with pharmacy services Patient decided to: Continue current medication management strategy  Care Plan and Follow Up Patient Decision:  Patient agrees to Care Plan and Follow-up.  Plan: Telephone follow up appointment with care management team member scheduled for:  2 months  Charlene Brooke, PharmD, [facility] Clinical Pharmacist [place] [facility] at [facility] [phone]

## 2021-10-10 NOTE — Telephone Encounter (Signed)
Patient returned call. See CCM encounter 

## 2021-10-10 NOTE — Telephone Encounter (Signed)
°  Chronic Care Management   Outreach Note  10/10/2021 Name: Michael Chang MRN: [id number] DOB: 30-Nov-1945  Referred by: [name], NP  Patient had a phone appointment scheduled with clinical pharmacist today.  An unsuccessful telephone outreach was attempted today. The patient was referred to the pharmacist for assistance with medications, care management and care coordination.   Patient will NOT be penalized in any way for missing a CCM appointment. The no-show fee does not apply.  If possible, a message was left to return call to: [phone] or to [facility].  Charlene Brooke, PharmD, BCACP Clinical Pharmacist [place] [facility] at [facility] [phone]

## 2021-10-10 NOTE — Patient Instructions (Signed)
Visit Information  Phone number for Pharmacist: [phone]   Goals Addressed             This Visit's Progress    Manage My Medicine       Timeframe:  Long-Range Goal Priority:  High Start Date:    09/13/21                         Expected End Date:    09/13/22                   Follow Up Date March 2023   - call for medicine refill 2 or 3 days before it runs out - call if I am sick and can't take my medicine - keep a list of all the medicines I take; vitamins and herbals too -Call nephrology for follow up appt     Why is this important?   These steps will help you keep on track with your medicines.   Notes:         Care Plan : [place]  Updates made by Charlton Haws, RPH since 10/10/2021 12:00 AM     Problem: Hypertension, Hyperlipidemia, Heart Failure, Coronary Artery Disease, and Chronic Kidney Disease   Priority: High     Long-Range Goal: Disease mgmt   Start Date: 09/13/2021  Expected End Date: 09/13/2022  This Visit's Progress: On track  Recent Progress: On track  Priority: High  Note:   Current Barriers:  Unable to independently monitor therapeutic efficacy Suboptimal therapeutic regimen for CAD  Pharmacist Clinical Goal(s):  Patient will achieve adherence to monitoring guidelines and medication adherence to achieve therapeutic efficacy adhere to plan to optimize therapeutic regimen for CAD as evidenced by report of adherence to recommended medication management changes through collaboration with PharmD and provider.   Interventions: 1:1 collaboration with [name], NP regarding development and update of comprehensive plan of care as evidenced by provider attestation and co-signature Inter-disciplinary care team collaboration (see longitudinal plan of care) Comprehensive medication review performed; medication list updated in electronic medical record  Hypertension (BP goal <130/80) -Controlled - pt reports BP is  improved with current regimen; he is not checking BP often but typically when he checks it is 130s/80s; he reports he feels better when BP is somewhat higher, he gets dizzy/weak when BP is ~120/70 -Current treatment: Amlodipine 10 mg daily AM Carvedilol 25 mg - 1/2 tab AM, 1 tab PM Hydralazine 100 mg BID Isosorbide MN 30 mg - 2 tab AM, 1/2 tab PM -Educated on BP goals and benefits of medications for prevention of heart attack, stroke and kidney damage; Daily salt intake goal < 2300 mg; Exercise goal of 150 minutes per week; Importance of home blood pressure monitoring; -Counseled to monitor BP at home periodically -Recommend to continue current medication  Hyperlipidemia / CAD / PAD (LDL goal < 70) -Not ideally controlled - LDL is above goal; pt declined statin trial 12/2019; pt would benefit from statin given CAD/PAD; he has thought about statin and agrees to start low dose-statin today -Hx NSTEMI (11/2019 in [place]?) medically managed; hx PAD w/ chronic leg ulcer -Current treatment: Aspirin 81 mg daily -Educated on Cholesterol goals; Benefits of statin for ASCVD risk reduction;  -Recommend atorvastatin 10 mg daily and repeat lipid panel in 2 months  Heart Failure (Goal: prevent exacerbations) -Controlled - pt denies swelling; BP is at goal at home, though not checking often -Last ejection  fraction: 55-60% (Date: 02/14/20) -HF type: Diastolic (Grade I dysfunction) -NYHA Class: I (no actitivty limitation) -Current treatment: Amlodipine 10 mg daily Carvedilol 25 mg - 1/2 tab AM, 1 tab PM Hydralazine 100 mg BID Isosorbide MN 30 mg - 2 tab AM, 1/2 tab PM -Educated on Benefits of medications for managing symptoms and prolonging life -Recommend to continue current medication  CKD Stage 3b (Goal: slow progression) -Not ideally controlled - pt last saw Dr Abigail Butts 03/2020, overdue for follow up -Discussed importance of BP control and hydration to avoid further kidney damage; advised to avoid  NSAIDs -Advised pt to make follow up appt with nephrologist; pt indicated he would call for appt  Health Maintenance -Vaccine gaps: Prevnar, Flu, Shingrix, TDAP -pt has declined vaccines previously, "Not a vaccine person"  -Pt is due for annual visit with PCP May 2023. He indicated he would call to make appt.  Patient Goals/Self-Care Activities Patient will:  - take medications as prescribed as evidenced by patient report and record review focus on medication adherence by routine check blood pressure peroidically, document, and provide at future appointments -Call nephrology for follow up appt      The patient verbalized understanding of instructions, educational materials, and care plan provided today and declined offer to receive copy of patient instructions, educational materials, and care plan.  Telephone follow up appointment with pharmacy team member scheduled for: 2 months  Charlene Brooke, PharmD, [facility] Clinical Pharmacist [facility] [phone]

## 2021-10-13 ENCOUNTER — Telehealth: Payer: Self-pay | Admitting: Primary Care

## 2021-10-13 NOTE — Telephone Encounter (Signed)
Call can not be completed at this time will call back later.

## 2021-10-13 NOTE — Telephone Encounter (Addendum)
Joellen,  Please call patient to confirm that he agreed to start atorvastatin for cholesterol. Would like to repeat lipids in 2-3 months so it's okay to go ahead and schedule him for his follow up office visit for then as he's due in May anyway.   Let me know.  Also confirm pharmacy.    ----- Message from Charlton Haws, [facility] sent at 10/10/2021  4:14 PM EST ----- Mr Biermann agreed to start a statin today! I recommended atorvastatin 10 mg and he was amenable to that - I advised repeat lipid panel in 2 months. It looks like he is due for annual visit with you in May, I'm not sure if you'd like to see him back sooner?  Let me know, Mendel Ryder

## 2021-10-16 NOTE — Telephone Encounter (Signed)
Called patient declined to start medication at this time. He also declined to schedule follow up in may. Would like to wait until sister has appointment and be seen then.

## 2021-10-16 NOTE — Telephone Encounter (Signed)
Ronney Lion. Thanks for trying, I have been trying for years.

## 2021-10-25 NOTE — Progress Notes (Signed)
Subjective:   Michael Chang is a 76 y.o. male who presents for Medicare Annual/Subsequent preventive examination.  I connected with Michael Chang today by telephone and verified that I am speaking with the correct person using two identifiers. Location patient: home Location provider: work Persons participating in the virtual visit: patient, Marine scientist.    I discussed the limitations, risks, security and privacy concerns of performing an evaluation and management service by telephone and the availability of in person appointments. I also discussed with the patient that there may be a patient responsible charge related to this service. The patient expressed understanding and verbally consented to this telephonic visit.    Interactive audio and video telecommunications were attempted between this provider and patient, however failed, due to patient having technical difficulties OR patient did not have access to video capability.  We continued and completed visit with audio only.  Some vital signs may be absent or patient reported.   Time Spent with patient on telephone encounter: 20 minutes  Review of Systems     Cardiac Risk Factors include: advanced age (>50men, >62 women);hypertension     Objective:    Today's Vitals   10/26/21 0945  Weight: 154 lb (69.9 kg)  Height: 6\' 1"  (1.854 m)   Body mass index is 20.32 kg/m.  Advanced Directives 10/26/2021 02/14/2020  Does Patient Have a Medical Advance Directive? No No  Would patient like information on creating a medical advance directive? Yes (MAU/Ambulatory/Procedural Areas - Information given) No - Patient declined    Current Medications (verified) Outpatient Encounter Medications as of 10/26/2021  Medication Sig   amLODipine (NORVASC) 10 MG tablet Take 1 tablet (10 mg total) by mouth daily.   aspirin 81 MG EC tablet Take by mouth.   carvedilol (COREG) 25 MG tablet Take 0.5 tablets (12.5 mg total) by mouth every morning AND 1  tablet (25 mg total) every evening.   hydrALAZINE (APRESOLINE) 100 MG tablet TAKE 1 TABLET BY MOUTH  TWICE DAILY   isosorbide mononitrate (IMDUR) 30 MG 24 hr tablet TAKE 2 TABLETS BY MOUTH IN  THE MORNING AND TAKE  ONE-HALF TABLET BY MOUTH IN THE EVENING   Multiple Vitamins-Minerals (CENTRUM SILVER 50+MEN) TABS Take by mouth.   No facility-administered encounter medications on file as of 10/26/2021.    Allergies (verified) Patient has no known allergies.   History: Past Medical History:  Diagnosis Date   Anemia    CAD (coronary artery disease)    Chronic combined systolic (congestive) and diastolic (congestive) heart failure (HCC)    Chronic ulcer of right lower extremity (HCC)    CKD (chronic kidney disease), stage III (HCC)    Essential hypertension    HLD (hyperlipidemia)    NSTEMI (non-ST elevated myocardial infarction) ([place])    PVD (peripheral vascular disease) ([place])    History reviewed. No pertinent surgical history. History reviewed. No pertinent family history. Social History   Socioeconomic History   Marital status: Single    Spouse name: Not on file   Number of children: Not on file   Years of education: Not on file   Highest education level: Not on file  Occupational History   Not on file  Tobacco Use   Smoking status: Never   Smokeless tobacco: Never  Substance and Sexual Activity   Alcohol use: Not Currently   Drug use: Not on file   Sexual activity: Not on file  Other Topics Concern   Not on file  Social History Narrative  Not on file   Social Determinants of Health   Financial Resource Strain: Low Risk    Difficulty of Paying Living Expenses: Not hard at all  Food Insecurity: No Food Insecurity   Worried About Charity fundraiser in the Last Year: Never true   [place] in the Last Year: Never true  Transportation Needs: No Transportation Needs   Lack of Transportation (Medical): No   Lack of Transportation (Non-Medical): No  Physical  Activity: Insufficiently Active   Days of Exercise per Week: 4 days   Minutes of Exercise per Session: 20 min  Stress: No Stress Concern Present   Feeling of Stress : Not at all  Social Connections: Socially Isolated   Frequency of Communication with Friends and Family: Once a week   Frequency of Social Gatherings with Friends and Family: Once a week   Attends Religious Services: Never   Marine scientist or Organizations: No   Attends Music therapist: Never   Marital Status: Never married    Tobacco Counseling Counseling given: Not Answered   Clinical Intake:  Pre-visit preparation completed: Yes  Pain : No/denies pain     BMI - recorded: 20.32 Nutritional Status: BMI of 19-24  Normal Nutritional Risks: None Diabetes: No  How often do you need to have someone help you when you read instructions, pamphlets, or other written materials from your doctor or pharmacy?: 1 - Never  Diabetic? No  Interpreter Needed?: No  Information entered by :: Orrin Brigham LPN   Activities of Daily Living In your present state of health, do you have any difficulty performing the following activities: 10/26/2021  Hearing? N  Vision? Y  Difficulty concentrating or making decisions? N  Walking or climbing stairs? Y  Dressing or bathing? N  Doing errands, shopping? Y  Comment sister [place] and eating ? N  Using the Toilet? N  In the past six months, have you accidently leaked urine? N  Do you have problems with loss of bowel control? N  Managing your Medications? N  Managing your Finances? N  Housekeeping or managing your Housekeeping? N  Some recent data might be hidden    Patient Care Team: [name], NP as PCP - General (Internal Medicine) Wellington Hampshire, MD as PCP - Cardiology (Cardiology) Charlton Haws, [facility] as Pharmacist (Pharmacist)  Indicate any recent Medical Services you may have received from other than [facility]  providers in the past year (date may be approximate).     Assessment:   This is a routine wellness examination for Michael Chang.  Hearing/Vision screen Hearing Screening - Comments:: No issues Vision Screening - Comments:: Last exam a year ago, wears glasses, Dr. Tommy Rainwater   Dietary issues and exercise activities discussed: Current Exercise Habits: Home exercise routine, Type of exercise: walking, Time (Minutes): 15, Frequency (Times/Week): 4, Weekly Exercise (Minutes/Week): 60, Intensity: Mild   Goals Addressed             This Visit's Progress    Patient Stated       Would like to continue to drink more water       Depression Screen PHQ 2/9 Scores 10/26/2021 02/10/2021 02/17/2020  PHQ - 2 Score 0 0 0  PHQ- 9 Score - 0 0    Fall Risk Fall Risk  10/26/2021 02/10/2021 02/17/2020  Falls in the past year? 0 0 1  Comment - - passed out  Number falls in past yr:  0 0 1  Injury with Fall? 0 0 1  Comment - - head injury  Risk for fall due to : - - Impaired balance/gait;History of fall(s);Medication side effect;Impaired vision  Follow up Falls prevention discussed - Falls evaluation completed;Falls prevention discussed    FALL RISK PREVENTION PERTAINING TO THE HOME:  Any stairs in or around the home? No  If so, are there any without handrails? No  Home free of loose throw rugs in walkways, pet beds, electrical cords, etc? Yes  Adequate lighting in your home to reduce risk of falls? Yes   ASSISTIVE DEVICES UTILIZED TO PREVENT FALLS:  Life alert? No  Use of a cane, walker or w/c? Yes , cane Grab bars in the bathroom? Yes  Shower chair or bench in shower? Yes  Elevated toilet seat or a handicapped toilet? No   TIMED UP AND GO:  Was the test performed? No .    Cognitive Function: Normal cognitive status assessed by  this Nurse Health Advisor. No abnormalities found.   MMSE - Mini Mental State Exam 02/17/2020  Not completed: Unable to complete        Immunizations  There is  no immunization history on file for this patient.  TDAP status: Due, Education has been provided regarding the importance of this vaccine. Advised may receive this vaccine at local pharmacy or Health Dept. Aware to provide a copy of the vaccination record if obtained from local pharmacy or Health Dept. Verbalized acceptance and understanding.  Flu Vaccine status: Declined, Education has been provided regarding the importance of this vaccine but patient still declined. Advised may receive this vaccine at local pharmacy or Health Dept. Aware to provide a copy of the vaccination record if obtained from local pharmacy or Health Dept. Verbalized acceptance and understanding.  Pneumococcal vaccine status: Declined,  Education has been provided regarding the importance of this vaccine but patient still declined. Advised may receive this vaccine at local pharmacy or Health Dept. Aware to provide a copy of the vaccination record if obtained from local pharmacy or Health Dept. Verbalized acceptance and understanding.   Covid-19 vaccine status: Declined, Education has been provided regarding the importance of this vaccine but patient still declined. Advised may receive this vaccine at local pharmacy or Health Dept.or vaccine clinic. Aware to provide a copy of the vaccination record if obtained from local pharmacy or Health Dept. Verbalized acceptance and understanding.  Qualifies for Shingles Vaccine? Yes   Zostavax completed No   Shingrix Completed?: No.    Education has been provided regarding the importance of this vaccine. Patient has been advised to call insurance company to determine out of pocket expense if they have not yet received this vaccine. Advised may also receive vaccine at local pharmacy or Health Dept. Verbalized acceptance and understanding.  Screening Tests Health Maintenance  Topic Date Due   Pneumonia Vaccine 72+ Years old (1 - PCV) Never done   TETANUS/TDAP  Never done   Zoster  Vaccines- Shingrix (1 of 2) Never done   INFLUENZA VACCINE  Never done   COLONOSCOPY (Pts 45-36yrs Insurance coverage will need to be confirmed)  11/29/2028   Hepatitis C Screening  Completed   HPV VACCINES  Aged Out   COVID-19 Vaccine  Discontinued    Health Maintenance  Health Maintenance Due  Topic Date Due   Pneumonia Vaccine 47+ Years old (1 - PCV) Never done   TETANUS/TDAP  Never done   Zoster Vaccines- Shingrix (1 of 2) Never done  INFLUENZA VACCINE  Never done    Colorectal cancer screening: Type of screening: Colonoscopy. Completed 11/30/18. Repeat every 10 years  Lung Cancer Screening: (Low Dose CT Chest recommended if Age 68-80 years, 30 pack-year currently smoking OR have quit w/in 15years.) does not qualify.     Additional Screening:  Hepatitis C Screening: does qualify; Completed 03/25/20  Vision Screening: Recommended annual ophthalmology exams for early detection of glaucoma and other disorders of the eye. Is the patient up to date with their annual eye exam?  No  Who is the provider or what is the name of the office in which the patient attends annual eye exams? Dr. Tommy Rainwater   Dental Screening: Recommended annual dental exams for proper oral hygiene  Community Resource Referral / Chronic Care Management: CRR required this visit?  No   CCM required this visit?  No      Plan:     I have personally reviewed and noted the following in the patients chart:   Medical and social history Use of alcohol, tobacco or illicit drugs  Current medications and supplements including opioid prescriptions. Patient is not currently taking opioid prescriptions. Functional ability and status Nutritional status Physical activity Advanced directives List of other physicians Hospitalizations, surgeries, and ER visits in previous 12 months Vitals Screenings to include cognitive, depression, and falls Referrals and appointments  In addition, I have reviewed and discussed  with patient certain preventive protocols, quality metrics, and best practice recommendations. A written personalized care plan for preventive services as well as general preventive health recommendations were provided to patient.   Due to this being a telephonic visit, the after visit summary with patients personalized plan was offered to patient via mail or my-chart.  Patient preferred to pick up at office at next visit.   Loma Messing, LPN   [id number]   Nurse Health Advisor  Nurse Notes: none

## 2021-10-26 ENCOUNTER — Ambulatory Visit (INDEPENDENT_AMBULATORY_CARE_PROVIDER_SITE_OTHER): Payer: Medicare Other

## 2021-10-26 VITALS — Ht 73.0 in | Wt 154.0 lb

## 2021-10-26 DIAGNOSIS — Z0000 Encounter for general adult medical examination without abnormal findings: Secondary | ICD-10-CM | POA: Diagnosis not present

## 2021-10-26 NOTE — Patient Instructions (Signed)
Mr. Michael Chang , Thank you for taking time to complete your Medicare Wellness Visit. I appreciate your ongoing commitment to your health goals. Please review the following plan we discussed and let me know if I can assist you in the future.   Screening recommendations/referrals: Colonoscopy: up to date, completed 11/30/18, due 11/29/28 Recommended yearly ophthalmology/optometry visit for glaucoma screening and checkup Recommended yearly dental visit for hygiene and checkup  Vaccinations: Influenza vaccine: May obtain vaccine at our office or your local pharmacy. Pneumococcal vaccine: May obtain vaccine at our office or your local pharmacy. Tdap vaccine: May obtain vaccine at your local pharmacy. Shingles vaccine: May obtain vaccine at your local pharmacy.   Covid-19: May obtain vaccine at our office or your local pharmacy.  Advanced directives: information available at your next visit  Conditions/risks identified: see problem list  Next appointment: Follow up in one year for your annual wellness visit. 10/29/22 @ 9:45am, this will a telephone visit  Preventive Care 60 Years and Older, Male Preventive care refers to lifestyle choices and visits with your health care provider that can promote health and wellness. What does preventive care include? A yearly physical exam. This is also called an annual well check. Dental exams once or twice a year. Routine eye exams. Ask your health care provider how often you should have your eyes checked. Personal lifestyle choices, including: Daily care of your teeth and gums. Regular physical activity. Eating a healthy diet. Avoiding tobacco and drug use. Limiting alcohol use. Practicing safe sex. Taking low doses of aspirin every day. Taking vitamin and mineral supplements as recommended by your health care provider. What happens during an annual well check? The services and screenings done by your health care provider during your annual well check will  depend on your age, overall health, lifestyle risk factors, and family history of disease. Counseling  Your health care provider may ask you questions about your: Alcohol use. Tobacco use. Drug use. Emotional well-being. Home and relationship well-being. Sexual activity. Eating habits. History of falls. Memory and ability to understand (cognition). Work and work Statistician. Screening  You may have the following tests or measurements: Height, weight, and BMI. Blood pressure. Lipid and cholesterol levels. These may be checked every 5 years, or more frequently if you are over 35 years old. Skin check. Lung cancer screening. You may have this screening every year starting at age 81 if you have a 30-pack-year history of smoking and currently smoke or have quit within the past 15 years. Fecal occult blood test (FOBT) of the stool. You may have this test every year starting at age 47. Flexible sigmoidoscopy or colonoscopy. You may have a sigmoidoscopy every 5 years or a colonoscopy every 10 years starting at age 37. Prostate cancer screening. Recommendations will vary depending on your family history and other risks. Hepatitis C blood test. Hepatitis B blood test. Sexually transmitted disease (STD) testing. Diabetes screening. This is done by checking your blood sugar (glucose) after you have not eaten for a while (fasting). You may have this done every 1-3 years. Abdominal aortic aneurysm (AAA) screening. You may need this if you are a current or former smoker. Osteoporosis. You may be screened starting at age 40 if you are at high risk. Talk with your health care provider about your test results, treatment options, and if necessary, the need for more tests. Vaccines  Your health care provider may recommend certain vaccines, such as: Influenza vaccine. This is recommended every year. Tetanus, diphtheria, and  acellular pertussis (Tdap, Td) vaccine. You may need a Td booster every 10  years. Zoster vaccine. You may need this after age 12. Pneumococcal 13-valent conjugate (PCV13) vaccine. One dose is recommended after age 3. Pneumococcal polysaccharide (PPSV23) vaccine. One dose is recommended after age 56. Talk to your health care provider about which screenings and vaccines you need and how often you need them. This information is not intended to replace advice given to you by your health care provider. Make sure you discuss any questions you have with your health care provider. Document Released: 10/14/2015 Document Revised: 06/06/2016 Document Reviewed: 07/19/2015 Elsevier Interactive Patient Education  2017 [place] Prevention in the Home Falls can cause injuries. They can happen to people of all ages. There are many things you can do to make your home safe and to help prevent falls. What can I do on the outside of my home? Regularly fix the edges of walkways and driveways and fix any cracks. Remove anything that might make you trip as you walk through a door, such as a raised step or threshold. Trim any bushes or trees on the path to your home. Use bright outdoor lighting. Clear any walking paths of anything that might make someone trip, such as rocks or tools. Regularly check to see if handrails are loose or broken. Make sure that both sides of any steps have handrails. Any raised decks and porches should have guardrails on the edges. Have any leaves, snow, or ice cleared regularly. Use sand or salt on walking paths during winter. Clean up any spills in your garage right away. This includes oil or grease spills. What can I do in the bathroom? Use night lights. Install grab bars by the toilet and in the tub and shower. Do not use towel bars as grab bars. Use non-skid mats or decals in the tub or shower. If you need to sit down in the shower, use a plastic, non-slip stool. Keep the floor dry. Clean up any water that spills on the floor as soon as it  happens. Remove soap buildup in the tub or shower regularly. Attach bath mats securely with double-sided non-slip rug tape. Do not have throw rugs and other things on the floor that can make you trip. What can I do in the bedroom? Use night lights. Make sure that you have a light by your bed that is easy to reach. Do not use any sheets or blankets that are too big for your bed. They should not hang down onto the floor. Have a firm chair that has side arms. You can use this for support while you get dressed. Do not have throw rugs and other things on the floor that can make you trip. What can I do in the kitchen? Clean up any spills right away. Avoid walking on wet floors. Keep items that you use a lot in easy-to-reach places. If you need to reach something above you, use a strong step stool that has a grab bar. Keep electrical cords out of the way. Do not use floor polish or wax that makes floors slippery. If you must use wax, use non-skid floor wax. Do not have throw rugs and other things on the floor that can make you trip. What can I do with my stairs? Do not leave any items on the stairs. Make sure that there are handrails on both sides of the stairs and use them. Fix handrails that are broken or loose. Make sure that  handrails are as long as the stairways. Check any carpeting to make sure that it is firmly attached to the stairs. Fix any carpet that is loose or worn. Avoid having throw rugs at the top or bottom of the stairs. If you do have throw rugs, attach them to the floor with carpet tape. Make sure that you have a light switch at the top of the stairs and the bottom of the stairs. If you do not have them, ask someone to add them for you. What else can I do to help prevent falls? Wear shoes that: Do not have high heels. Have rubber bottoms. Are comfortable and fit you well. Are closed at the toe. Do not wear sandals. If you use a stepladder: Make sure that it is fully opened.  Do not climb a closed stepladder. Make sure that both sides of the stepladder are locked into place. Ask someone to hold it for you, if possible. Clearly mark and make sure that you can see: Any grab bars or handrails. First and last steps. Where the edge of each step is. Use tools that help you move around (mobility aids) if they are needed. These include: Canes. Walkers. Scooters. Crutches. Turn on the lights when you go into a dark area. Replace any light bulbs as soon as they burn out. Set up your furniture so you have a clear path. Avoid moving your furniture around. If any of your floors are uneven, fix them. If there are any pets around you, be aware of where they are. Review your medicines with your doctor. Some medicines can make you feel dizzy. This can increase your chance of falling. Ask your doctor what other things that you can do to help prevent falls. This information is not intended to replace advice given to you by your health care provider. Make sure you discuss any questions you have with your health care provider. Document Released: 07/14/2009 Document Revised: 02/23/2016 Document Reviewed: 10/22/2014 Elsevier Interactive Patient Education  2017 [employer].

## 2021-10-31 DIAGNOSIS — I252 Old myocardial infarction: Secondary | ICD-10-CM

## 2021-10-31 DIAGNOSIS — I5022 Chronic systolic (congestive) heart failure: Secondary | ICD-10-CM

## 2021-10-31 DIAGNOSIS — I2510 Atherosclerotic heart disease of native coronary artery without angina pectoris: Secondary | ICD-10-CM | POA: Diagnosis not present

## 2021-10-31 DIAGNOSIS — N183 Chronic kidney disease, stage 3 unspecified: Secondary | ICD-10-CM

## 2021-10-31 DIAGNOSIS — I10 Essential (primary) hypertension: Secondary | ICD-10-CM

## 2021-11-06 ENCOUNTER — Telehealth: Payer: Self-pay

## 2021-11-06 NOTE — Progress Notes (Signed)
Entered in error

## 2021-12-01 ENCOUNTER — Ambulatory Visit: Payer: Medicare Other | Admitting: General Practice

## 2021-12-04 ENCOUNTER — Telehealth: Payer: Self-pay

## 2021-12-04 NOTE — Progress Notes (Signed)
? ? ?  Chronic Care Management ?Pharmacy Assistant  ? ?Name: Michael Chang  MRN: [id number] DOB: 1945/10/26 ? ?Reason for Encounter: CCM Counsellor) ? ?Medications: ?Outpatient Encounter Medications as of 12/04/2021  ?Medication Sig  ? amLODipine (NORVASC) 10 MG tablet Take 1 tablet (10 mg total) by mouth daily.  ? aspirin 81 MG EC tablet Take by mouth.  ? carvedilol (COREG) 25 MG tablet Take 0.5 tablets (12.5 mg total) by mouth every morning AND 1 tablet (25 mg total) every evening.  ? hydrALAZINE (APRESOLINE) 100 MG tablet TAKE 1 TABLET BY MOUTH  TWICE DAILY  ? isosorbide mononitrate (IMDUR) 30 MG 24 hr tablet TAKE 2 TABLETS BY MOUTH IN  THE MORNING AND TAKE  ONE-HALF TABLET BY MOUTH IN THE EVENING  ? Multiple Vitamins-Minerals (CENTRUM SILVER 50+MEN) TABS Take by mouth.  ? ?No facility-administered encounter medications on file as of 12/04/2021.  ? ?[name] was contacted to remind of upcoming telephone visit with Charlene Brooke on 12/07/2021 at 3:45 pm. Patient was reminded to have all medications, supplements and any blood glucose and blood pressure readings available for review at appointment. If unable to reach, a voicemail was left for patient.  ? ?Are you having any problems with your medications? No  ? ?Do you have any concerns you like to discuss with the pharmacist? No ? ?Star Rating Drugs: ?Medication:  Last Fill: Day Supply ?No star rating drugs notes ? ?Charlene Brooke, CPP notified ? ?Marijean Niemann, RMA ?Clinical Pharmacy Assistant ?[phone] ? ?Time Spent: 10 minutes ? ? ? ?

## 2021-12-06 NOTE — Progress Notes (Signed)
Cardiology Clinic Note   Patient Name: Michael Chang Date of Encounter: 12/06/2021  Primary Care Provider:  [name], NP Primary Cardiologist:  [name], MD  Patient Profile    Michael Chang 76 year old male presents the clinic today for follow-up evaluation of his chronic diastolic CHF and hypertension.  Past Medical History    Past Medical History:  Diagnosis Date   Anemia    CAD (coronary artery disease)    Chronic combined systolic (congestive) and diastolic (congestive) heart failure (HCC)    Chronic ulcer of right lower extremity (HCC)    CKD (chronic kidney disease), stage III (HCC)    Essential hypertension    HLD (hyperlipidemia)    NSTEMI (non-ST elevated myocardial infarction) ([place])    PVD (peripheral vascular disease) ([place])    No past surgical history on file.  Allergies  No Known Allergies  History of Present Illness    Michael Chang has a PMH of HTN, anemia, chronic combined systolic and diastolic CHF, CKD stage III, HLD, PVD, and NSTEMI.  He was diagnosed with heart failure 3/21 in [place].  He did have a NSTEMI which was treated medically.  He was initially seen for a syncopal episode.  During that time he was noted to be anemic and underwent EGD which showed moderately erythematous stomach mucosa.  A colonoscopy showed internal hemorrhoids.  It was not clear if he had acute kidney injury or whether he had chronic kidney disease.  He retired from [employer] work and moved to [place] to be closer to his sister.   He was hospitalized at [facility] 5/21.  His echocardiogram showed EF of 55 to 60% with severe LVH.  His troponins were mildly elevated.   He was last seen by Dr. [name] 09/13/2020 and denied chest pain at that time.  He denied shortness of breath and palpitations.  He reported compliance with his medications.  He reported his vision had improved but was still significantly impaired.  He does not drive.   He presented to clinic  03/16/21 for follow-up evaluation and stated he felts well.  He did notice shortness of breath with increased physical activity but not with normal activity.  He presented with his sister.  He reported that his blood pressure routinely ran in the 140s over 80s range.  He noted that when his blood pressure was "normal" he did not feel as well.  We discussed his echocardiogram and his LVH.  We also discussed his increased risks of CVA and renal damage with increased blood pressure.  I reviewed his diet.  He admitted to sodium indiscretion.  I  increased his carvedilol to 25 mg at night and maintain his 12.5 mg dosing during the day.  We  prescribed 90-day supply for his amlodipine.  I gave him a blood pressure monitor, had him maintain a blood pressure log, and planned follow-up in 1 month.  He presented to the clinic 06/01/21 for follow-up evaluation stated he felt well.  He continued to self isolate at home due to the COVID-19 virus.  He reported that he had not ever been a vaccination type person.  He had not received his COVID vaccinations and had not been infected.  We reviewed his blood pressure which was much better -126/80.  He was tolerating medication increase well.  He continued to eat a low-salt low sugar diet.  He presented by himself.  I  continued his current medication regimen and plan follow-up for 6 months.  He presents to the clinic today for follow-up evaluation states he feels well.  He reports that he has not been as physically active due to weather.  He has been taking the trash out and walking around his apartment.  He reports compliance with his medications and denies side effects.  His wife asks about his fingernails.  We reviewed treatments for over-the-counter antifungal medications.  They expressed understanding.  I will have him increase his physical activity as tolerated, continue his heart healthy low-sodium diet, and follow-up in 6 to 9 months.   Today he denies chest pain,  shortness of breath, lower extremity edema, fatigue, palpitations, melena, hematuria, hemoptysis, diaphoresis, weakness, presyncope, syncope, orthopnea, and PND.  Home Medications    Prior to Admission medications   Medication Sig Start Date End Date Taking? Authorizing Provider  hydrALAZINE (APRESOLINE) 100 MG tablet TAKE 1 TABLET BY MOUTH  TWICE DAILY 05/24/21   Wellington Hampshire, MD  isosorbide mononitrate (IMDUR) 30 MG 24 hr tablet TAKE 2 TABLETS BY MOUTH IN  THE MORNING AND TAKE  ONE-HALF TABLET BY MOUTH IN THE EVENING 05/24/21   Wellington Hampshire, MD  amLODipine (NORVASC) 10 MG tablet Take 1 tablet (10 mg total) by mouth daily. 03/16/21   Deberah Pelton, NP  aspirin 81 MG EC tablet Take by mouth. 01/05/20   [provider]  carvedilol (COREG) 25 MG tablet Take 0.5 tablets (12.5 mg total) by mouth every morning AND 1 tablet (25 mg total) every evening. 03/16/21   Deberah Pelton, NP  Multiple Vitamin (MULTIVITAMIN WITH MINERALS) TABS tablet Take 1 tablet by mouth daily.    [provider]  Multiple Vitamins-Minerals (CENTRUM SILVER 50+MEN) TABS Take by mouth.    [provider]    Family History    No family history on file. He indicated that his mother is deceased. He indicated that his father is deceased.   Social History    Social History   Socioeconomic History   Marital status: Single    Spouse name: Not on file   Number of children: Not on file   Years of education: Not on file   Highest education level: Not on file  Occupational History   Not on file  Tobacco Use   Smoking status: Never   Smokeless tobacco: Never  Substance and Sexual Activity   Alcohol use: Not Currently   Drug use: Not on file   Sexual activity: Not on file  Other Topics Concern   Not on file  Social History Narrative   Not on file   Social Determinants of Health   Financial Resource Strain: Low Risk    Difficulty of Paying Living Expenses: Not hard at all  Food  Insecurity: No Food Insecurity   Worried About Running Out of Food in the Last Year: Never true   [place] in the Last Year: Never true  Transportation Needs: No Transportation Needs   Lack of Transportation (Medical): No   Lack of Transportation (Non-Medical): No  Physical Activity: Insufficiently Active   Days of Exercise per Week: 4 days   Minutes of Exercise per Session: 20 min  Stress: No Stress Concern Present   Feeling of Stress : Not at all  Social Connections: Socially Isolated   Frequency of Communication with Friends and Family: Once a week   Frequency of Social Gatherings with Friends and Family: Once a week   Attends Religious Services: Never   Retail buyer of [employer]  or Organizations: No   Attends Archivist Meetings: Never   Marital Status: Never married  Human resources officer Violence: Not At Risk   Fear of Current or Ex-Partner: No   Emotionally Abused: No   Physically Abused: No   Sexually Abused: No     Review of Systems    General:  No chills, fever, night sweats or weight changes.  Cardiovascular:  No chest pain, dyspnea on exertion, edema, orthopnea, palpitations, paroxysmal nocturnal dyspnea. Dermatological: No rash, lesions/masses Respiratory: No cough, dyspnea Urologic: No hematuria, dysuria Abdominal:   No nausea, vomiting, diarrhea, bright red blood per rectum, melena, or hematemesis Neurologic:  No visual changes, wkns, changes in mental status. All other systems reviewed and are otherwise negative except as noted above.  Physical Exam    VS:  There were no vitals taken for this visit. , BMI There is no height or weight on file to calculate BMI. GEN: Well nourished, well developed, in no acute distress. HEENT: normal. Neck: Supple, no JVD, carotid bruits, or masses. Cardiac: RRR, no murmurs, rubs, or gallops. No clubbing, cyanosis, edema.  Radials/DP/PT 2+ and equal bilaterally.  Respiratory:  Respirations regular and unlabored, clear  to auscultation bilaterally. GI: Soft, nontender, nondistended, BS + x 4. MS: no deformity or atrophy. Skin: warm and dry, no rash. Neuro:  Strength and sensation are intact. Psych: Normal affect.  Accessory Clinical Findings    Recent Labs: 02/10/2021: ALT 20; BUN 19; Creatinine, Ser 1.94; Hemoglobin 12.6; Platelets 224.0; Potassium 4.5; Sodium 143; TSH 1.79   Recent Lipid Panel    Component Value Date/Time   CHOL 149 02/10/2021 0936   TRIG 79.0 02/10/2021 0936   HDL 43.50 02/10/2021 0936   CHOLHDL 3 02/10/2021 0936   VLDL 15.8 02/10/2021 0936   LDLCALC 90 02/10/2021 0936    ECG personally reviewed by me today-Normal sinus rhythm no ST or T wave deviation 65 bpm  EKG 03/16/2021 Normal sinus rhythm with sinus arrhythmia 66 bpm- No acute changes   EKG 09/13/2020 Normal sinus rhythm no ST or T wave deviation   Echocardiogram 02/14/2020 IMPRESSIONS     1. Left ventricular ejection fraction, by estimation, is 55 to 60%. The  left ventricle has normal function. The left ventricle has no regional  wall motion abnormalities. There is severe left ventricular hypertrophy.  Left ventricular diastolic parameters   are consistent with Grade I diastolic dysfunction (impaired relaxation).   2. Right ventricular systolic function is normal. The right ventricular  size is normal.   3. Left atrial size was mildly dilated.  Assessment & Plan   1.  Essential hypertension-BP today 126/80.  Continues to be well-controlled at home. Continue  amlodipine, hydralazine, Imdur Continue carvedilol to 12.5 mg a.m. and 25 mg p.m. Heart healthy low-sodium diet Increase physical activity as tolerated Continue blood pressure log   Chronic diastolic CHF-weight stable.  Euvolemic today.  NYHA class II.  Echocardiogram showed LVEF 55-60% with G1 DD.  Left atrium mildly dilated. Continue carvedilol to 12.5 in the AM and 25 in p.m.  Continue hydralazine, Imdur Heart healthy low-sodium diet-salty 6  given Increase physical activity as tolerated  Coronary artery disease-no recent chest discomfort/pain.  Previous NSTEMI managed medically. Continue amlodipine, carvedilol, aspirin, Imdur, hydralazine Heart healthy low-sodium diet-salty 6 given Increase physical activity as tolerated   CKD-creatinine 1.94 at baseline 02/10/2021 Follows with PCP   Disposition: Follow-up with Dr. [name] or me in 6-9 months.   Jossie Ng. Genavive Kubicki NP-C  12/06/2021, 7:43 AM [address] Office [phone] Fax [phone]  Notice: This dictation was prepared with Dragon dictation along with smaller phrase technology. Any transcriptional errors that result from this process are unintentional and may not be corrected upon review.  I spent 13 minutes examining this patient, reviewing medications, and using patient centered shared decision making involving her cardiac care.  Prior to her visit I spent greater than 20 minutes reviewing her past medical history,  medications, and prior cardiac tests.

## 2021-12-07 ENCOUNTER — Other Ambulatory Visit: Payer: Self-pay

## 2021-12-07 ENCOUNTER — Ambulatory Visit (INDEPENDENT_AMBULATORY_CARE_PROVIDER_SITE_OTHER): Payer: Medicare Other | Admitting: Pharmacist

## 2021-12-07 DIAGNOSIS — N183 Chronic kidney disease, stage 3 unspecified: Secondary | ICD-10-CM

## 2021-12-07 DIAGNOSIS — I2510 Atherosclerotic heart disease of native coronary artery without angina pectoris: Secondary | ICD-10-CM

## 2021-12-07 DIAGNOSIS — I10 Essential (primary) hypertension: Secondary | ICD-10-CM

## 2021-12-07 DIAGNOSIS — I5022 Chronic systolic (congestive) heart failure: Secondary | ICD-10-CM

## 2021-12-07 NOTE — Progress Notes (Signed)
Chronic Care Management Pharmacy Note  12/07/2021 Name:  Michael Chang MRN:  [id number] DOB:  October 14, 1945  Summary: CCM F/U visit -Pt is not on a statin. Pt will benefit from statin given hx NSTEMI 11/2019 (medically managed) and has hx of PAD. LDL was 90 at last check (01/2021). Pt plans to discuss statin at upcoming cardiology appt (12/11/21) -GFR is 33; pt has not seen nephrology (Dr Juleen China) since 03/2020  Recommendations/Changes made from today's visit: -Advised pt to follow up with nephrology - gave phone number for office and pt indicated he would call -Advised PCP annual visit is due May 2023, pt indicated he has to wait for his sister to schedule appt and he will schedule on same day since she drives him  Plan: -S[place]ance will call patient 3 months for general adherence -Pharmacist follow up televisit scheduled for 6 months -PCP annual visit due May 2023    Subjective: Michael Chang is an 76 y.o. year old male who is a primary patient of [name], NP.  The CCM team was consulted for assistance with disease management and care coordination needs.    Engaged with patient by telephone for follow up visit in response to provider referral for pharmacy case management and/or care coordination services.   Consent to Services:  The patient was given information about Chronic Care Management services, agreed to services, and gave verbal consent prior to initiation of services.  Please see initial visit note for detailed documentation.   Patient Care Team: [name], NP as PCP - General (Internal Medicine) Wellington Hampshire, MD as PCP - Cardiology (Cardiology) Charlton Haws, [facility] as Pharmacist (Pharmacist)  Patient moved to [facility] from [place] in 11/2019 to be closer to his sister. He was not taking care of himself in [place] and had been without medical care since 2007 prior to NSTEMI in 11/2019. He lives by himself now but sister is close by. He is doing better  taking care of himself and manages his own medicines using a pill box.  Recent office visits: 02/10/21-PCP-[name],NP-Patient presented for AWV.Labs ordered,discussed vaccines and screenings,follow up with wound care, cardiology and nephrology. No mediction changes.  Recent consult visits: 06/01/21-Cardiology- [name] presented for follow up CHF, HTN. BP improved after carvedilol increased last time. F/U 6 months. 03/30/21-Optometry- no data found 03/16/21-Cardiology-[name],NP-Patient presented for follow up hypertension, CHF. Increased carvedilol to 25 mg at night. Keep BP log. F/u 2 months.   Hospital visits: None in previous 6 months   Objective:  Lab Results  Component Value Date   CREATININE 1.94 (H) 02/10/2021   BUN 19 02/10/2021   GFR 33.39 (L) 02/10/2021   GFRNONAA 31 (L) 02/15/2020   GFRAA 36 (L) 02/15/2020   NA 143 02/10/2021   K 4.5 02/10/2021   CALCIUM 9.5 02/10/2021   CO2 28 02/10/2021   GLUCOSE 92 02/10/2021    Lab Results  Component Value Date/Time   GFR 33.39 (L) 02/10/2021 09:36 AM   GFR 39.07 (L) 01/05/2020 10:28 AM    Last diabetic Eye exam: No results found for: HMDIABEYEEXA  Last diabetic Foot exam: No results found for: HMDIABFOOTEX   Lab Results  Component Value Date   CHOL 149 02/10/2021   HDL 43.50 02/10/2021   LDLCALC 90 02/10/2021   TRIG 79.0 02/10/2021   CHOLHDL 3 02/10/2021    Hepatic Function Latest Ref Rng & Units 02/10/2021 02/14/2020 01/05/2020  Total Protein 6.0 - 8.3 g/dL 8.2 8.3(H) 7.9  Albumin 3.5 - 5.2 g/dL 4.1 2.9(L) 3.0(L)  AST 0 - 37 U/L _0 ALT 0 - 53 U/L _1 Alk Phosphatase 39 - 117 U/L 62 51 55  Total Bilirubin 0.2 - 1.2 mg/dL 0.5 0.6 0.3    Lab Results  Component Value Date/Time   TSH 1.79 02/10/2021 09:36 AM   TSH 4.17 01/05/2020 10:28 AM    CBC Latest Ref Rng & Units 02/10/2021 02/18/2020 02/15/2020  WBC 4.0 - 10.5 K/uL 4.3 6.0 4.7  Hemoglobin 13.0 - 17.0 g/dL 12.6(L) 9.2(L)  7.9(L)  Hematocrit 39.0 - 52.0 % 38.1(L) 28.3(L) 25.3(L)  Platelets 150.0 - 400.0 K/uL 224.0 278.0 254    No results found for: VD25OH  Clinical ASCVD: Yes  The ASCVD Risk score (Arnett DK, et al., 2019) failed to calculate for the following reasons:   The patient has a prior MI or stroke diagnosis    Depression screen [facility] 2/9 10/26/2021 02/10/2021 02/17/2020  Decreased Interest 0 0 0  Down, Depressed, Hopeless 0 0 0  PHQ - 2 Score 0 0 0  Altered sleeping - 0 0  Tired, decreased energy - 0 0  Change in appetite - 0 0  Feeling bad or failure about yourself  - 0 0  Trouble concentrating - 0 0  Moving slowly or fidgety/restless - 0 0  Suicidal thoughts - 0 0  PHQ-9 Score - 0 0  Difficult doing work/chores - Not difficult at all Not difficult at all     Social History   Tobacco Use  Smoking Status Never  Smokeless Tobacco Never   BP Readings from Last 3 Encounters:  06/01/21 126/80  03/16/21 (!) 144/84  02/10/21 (!) 144/85   Pulse Readings from Last 3 Encounters:  06/01/21 60  03/16/21 66  02/10/21 61   Wt Readings from Last 3 Encounters:  10/26/21 154 lb (69.9 kg)  06/01/21 154 lb 9.6 oz (70.1 kg)  03/16/21 156 lb 12.8 oz (71.1 kg)   BMI Readings from Last 3 Encounters:  10/26/21 20.32 kg/m  06/01/21 20.40 kg/m  03/16/21 20.41 kg/m    Assessment/Interventions: Review of patient past medical history, allergies, medications, health status, including review of consultants reports, laboratory and other test data, was performed as part of comprehensive evaluation and provision of chronic care management services.   SDOH:  (Social Determinants of Health) assessments and interventions performed: No   SDOH Screenings   Alcohol Screen: Low Risk    Last Alcohol Screening Score (AUDIT): 0  Depression (PHQ2-9): Low Risk    PHQ-2 Score: 0  Financial Resource Strain: Low Risk    Difficulty of Paying Living Expenses: Not hard at all  Food Insecurity: No Food Insecurity    Worried About Charity fundraiser in the Last Year: Never true   Ran Out of Food in the Last Year: Never true  Housing: Low Risk    Last Housing Risk Score: 0  Physical Activity: Insufficiently Active   Days of Exercise per Week: 4 days   Minutes of Exercise per Session: 20 min  Social Connections: Socially Isolated   Frequency of Communication with Friends and Family: Once a week   Frequency of Social Gatherings with Friends and Family: Once a week   Attends Religious Services: Never   Marine scientist or Organizations: No   Attends Archivist Meetings: Never   Marital Status: Never married  Stress: No Stress Concern Present   Feeling of Stress :  Not at all  Tobacco Use: Low Risk    Smoking Tobacco Use: Never   Smokeless Tobacco Use: Never   Passive Exposure: Not on file  Transportation Needs: No Transportation Needs   Lack of Transportation (Medical): No   Lack of Transportation (Non-Medical): No    CCM Care Plan  No Known Allergies  Medications Reviewed Today     Reviewed by Charlton Haws, [facility] (Pharmacist) on 12/07/21 at 1614  Med List Status: <None>   Medication Order Taking? Sig Documenting Provider Last Dose Status Informant  amLODipine (NORVASC) 10 MG tablet [id number] Yes Take 1 tablet (10 mg total) by mouth daily. Deberah Pelton, NP Taking Active   aspirin 81 MG EC tablet [id number] Yes Take by mouth. [provider] Taking Active   carvedilol (COREG) 25 MG tablet [id number] Yes Take 0.5 tablets (12.5 mg total) by mouth every morning AND 1 tablet (25 mg total) every evening. Deberah Pelton, NP Taking Active   hydrALAZINE (APRESOLINE) 100 MG tablet [id number] Yes TAKE 1 TABLET BY MOUTH  TWICE DAILY Wellington Hampshire, MD Taking Active   isosorbide mononitrate (IMDUR) 30 MG 24 hr tablet [id number] Yes TAKE 2 TABLETS BY MOUTH IN  THE MORNING AND TAKE  ONE-HALF TABLET BY MOUTH IN THE Gwenlyn Fudge, MD Taking Active   Multiple  Vitamins-Minerals (CENTRUM SILVER 50+MEN) TABS [id number] Yes Take by mouth. [provider] Taking Active             Patient Active Problem List   Diagnosis Date Noted   Preventative health care 02/10/2021   Follow-up examination after eye surgery 03/22/2020   Retinal holes, right 03/19/2020   Secondary corneal edema, right 03/15/2020   Aphakia of eye, right 03/15/2020   Cataract (lens) fragments in eye following cataract surgery, right eye 03/15/2020   Anemia in chronic kidney disease 03/02/2020   Benign hypertensive kidney disease with chronic kidney disease 03/02/2020   Proteinuria 03/02/2020   Iron deficiency anemia    Weakness    Near syncope 02/14/2020   Cardiomyopathy, ischemic 02/14/2020   Skin ulcers of foot, bilateral ([place]) 01/07/2020   CAD (coronary artery disease) 01/07/2020   History of non-ST elevation myocardial infarction (NSTEMI) 01/05/2020   Congestive heart failure ([place]) 01/05/2020   PAD (peripheral artery disease) ([place]) 01/05/2020   Chronic ulcer of right lower extremity with fat layer exposed ([place]) 01/05/2020   Chronic kidney disease 01/05/2020   Essential hypertension 01/05/2020   Elevated TSH 01/05/2020     There is no immunization history on file for this patient.  Conditions to be addressed/monitored:  Hypertension, Hyperlipidemia, Heart Failure, Coronary Artery Disease, and Chronic Kidney Disease  Care Plan : CCM Pharmacy Care Plan  Updates made by Charlton Haws, RPH since 12/07/2021 12:00 AM     Problem: Hypertension, Hyperlipidemia, Heart Failure, Coronary Artery Disease, and Chronic Kidney Disease   Priority: High     Long-Range Goal: Disease mgmt   Start Date: 09/13/2021  Expected End Date: 09/13/2022  This Visit's Progress: On track  Recent Progress: On track  Priority: High  Note:   Current Barriers:  Unable to independently monitor therapeutic efficacy Suboptimal therapeutic regimen for CAD  Pharmacist Clinical  Goal(s):  Patient will achieve adherence to monitoring guidelines and medication adherence to achieve therapeutic efficacy adhere to plan to optimize therapeutic regimen for CAD as evidenced by report of adherence to recommended medication management changes through collaboration with PharmD and provider.   Interventions: 1:1 collaboration  with [name], NP regarding development and update of comprehensive plan of care as evidenced by provider attestation and co-signature Inter-disciplinary care team collaboration (see longitudinal plan of care) Comprehensive medication review performed; medication list updated in electronic medical record  Hypertension / Heart Failure (BP goal <130/80) -Controlled - pt reports BP is improved with current regimen; he is not checking BP often but typically when he checks it is 130s/80s; he reports he feels better when BP is somewhat higher, he gets dizzy/weak when BP is ~120/70 -Last ejection fraction: 55-60% (Date: 02/14/20) -HF type: Diastolic (Grade I dysfunction); NYHA Class: I (no actitivty limitation) -Current treatment: Amlodipine 10 mg daily AM - Appropriate, Effective, Safe, Accessible Carvedilol 25 mg - 1/2 tab AM, 1 tab PM -Appropriate, Effective, Safe, Accessible Hydralazine 100 mg BID -Appropriate, Effective, Safe, Accessible Isosorbide MN 30 mg - 2 tab AM, 1/2 tab PM -Appropriate, Effective, Safe, Accessible -Educated on BP goals and benefits of medications for prevention of heart attack, stroke and kidney damage; Daily salt intake goal < 2300 mg; Exercise goal of 150 minutes per week; Importance of home blood pressure monitoring; -Counseled to monitor BP at home periodically -Recommend to continue current medication  Hyperlipidemia / CAD / PAD (LDL goal < 70) -Not ideally controlled - LDL is above goal; pt declined statin trial 12/2019; pt would benefit from statin given CAD/PAD; he had agreed to start statin last visit but then declined  when PCP assistant called. Today he reports he has not changed his mind but has cardiology appt next week and will discuss then -Hx NSTEMI (11/2019 in [place]?) medically managed; hx PAD w/ chronic leg ulcer -Current treatment: Aspirin 81 mg daily -Appropriate, Effective, Safe, Accessible -Educated on Cholesterol goals; Benefits of statin for ASCVD risk reduction;  -Advised pt to discuss statin therapy at cardiology appt 3/13  CKD Stage 3b (Goal: slow progression) -Not ideally controlled - pt last saw Dr Abigail Butts 03/2020, overdue for follow up -Discussed importance of BP control and hydration to avoid further kidney damage; advised to avoid NSAIDs -Advised pt to make follow up appt with nephrologist; pt indicated he would call for appt (gave him phone number to office)  Health Maintenance -Vaccine gaps: Prevnar, Flu, Shingrix, TDAP -pt has declined vaccines previously, "Not a vaccine person"  -Pt is due for annual visit with PCP May 2023. He indicated he would call to make appt.  Patient Goals/Self-Care Activities Patient will:  - take medications as prescribed as evidenced by patient report and record review focus on medication adherence by routine check blood pressure peroidically, document, and provide at future appointments -Call nephrology for follow up appt      Medication Assistance: None required.  Patient affirms current coverage meets needs.  Compliance/Adherence/Medication fill history: Care Gaps: None  Star-Rating Drugs: None  Patient's preferred pharmacy is:  [facility] ([facility] - [address], [address] [place] [address] Phone: [phone] Fax: [phone]   Uses pill box? Yes Pt endorses 100% compliance  We discussed: Current pharmacy is preferred with insurance plan and patient is satisfied with pharmacy services Patient decided to: Continue current medication management strategy  Care  Plan and Follow Up Patient Decision:  Patient agrees to Care Plan and Follow-up.  Plan: Telephone follow up appointment with care management team member scheduled for:  6 months  Charlene Brooke, PharmD, BCACP Clinical Pharmacist [facility] [phone]

## 2021-12-07 NOTE — Patient Instructions (Signed)
Visit Information ? ?Phone number for Pharmacist: [phone] ? ? Goals Addressed   ? ?  ?  ?  ?  ? This Visit's Progress  ?  Manage My Medicine     ?  Timeframe:  Long-Range Goal ?Priority:  High ?Start Date:    09/13/21                         ?Expected End Date:    09/13/22                  ? ?Follow Up Date Sept 2023 ?  ?- call for medicine refill 2 or 3 days before it runs out ?- call if I am sick and can't take my medicine ?- keep a list of all the medicines I take; vitamins and herbals too ?-Call nephrology for follow up appt ?  ?  ?Why is this important?   ?These steps will help you keep on track with your medicines. ?  ?Notes:  ?  ? ?  ? ? ?Care Plan : [place]  ?Updates made by Charlton Haws, [place] since 12/07/2021 12:00 AM  ?  ? ?Problem: Hypertension, Hyperlipidemia, Heart Failure, Coronary Artery Disease, and Chronic Kidney Disease   ?Priority: High  ?  ? ?Long-Range Goal: Disease mgmt   ?Start Date: 09/13/2021  ?Expected End Date: 09/13/2022  ?This Visit's Progress: On track  ?Recent Progress: On track  ?Priority: High  ?Note:   ?Current Barriers:  ?Unable to independently monitor therapeutic efficacy ?Suboptimal therapeutic regimen for CAD ? ?Pharmacist Clinical Goal(s):  ?Patient will achieve adherence to monitoring guidelines and medication adherence to achieve therapeutic efficacy ?adhere to plan to optimize therapeutic regimen for CAD as evidenced by report of adherence to recommended medication management changes through collaboration with PharmD and provider.  ? ?Interventions: ?1:1 collaboration with [name], NP regarding development and update of comprehensive plan of care as evidenced by provider attestation and co-signature ?Inter-disciplinary care team collaboration (see longitudinal plan of care) ?Comprehensive medication review performed; medication list updated in electronic medical record ? ?Hypertension / Heart Failure (BP goal <130/80) ?-Controlled - pt  reports BP is improved with current regimen; he is not checking BP often but typically when he checks it is 130s/80s; he reports he feels better when BP is somewhat higher, he gets dizzy/weak when BP is ~120/70 ?-Last ejection fraction: 55-60% (Date: 02/14/20) ?-HF type: Diastolic (Grade I dysfunction); NYHA Class: I (no actitivty limitation) ?-Current treatment: ?Amlodipine 10 mg daily AM - Appropriate, Effective, Safe, Accessible ?Carvedilol 25 mg - 1/2 tab AM, 1 tab PM -Appropriate, Effective, Safe, Accessible ?Hydralazine 100 mg BID -Appropriate, Effective, Safe, Accessible ?Isosorbide MN 30 mg - 2 tab AM, 1/2 tab PM -Appropriate, Effective, Safe, Accessible ?-Educated on BP goals and benefits of medications for prevention of heart attack, stroke and kidney damage; Daily salt intake goal < 2300 mg; Exercise goal of 150 minutes per week; Importance of home blood pressure monitoring; ?-Counseled to monitor BP at home periodically ?-Recommend to continue current medication ? ?Hyperlipidemia / CAD / PAD (LDL goal < 70) ?-Not ideally controlled - LDL is above goal; pt declined statin trial 12/2019; pt would benefit from statin given CAD/PAD; he had agreed to start statin last visit but then declined when PCP assistant called. Today he reports he has not changed his mind but has cardiology appt next week and will discuss then ?-Hx NSTEMI (11/2019 in [place]?) medically  managed; hx PAD w/ chronic leg ulcer ?-Current treatment: ?Aspirin 81 mg daily -Appropriate, Effective, Safe, Accessible ?-Educated on Cholesterol goals; Benefits of statin for ASCVD risk reduction;  ?-Advised pt to discuss statin therapy at cardiology appt 3/13 ? ?CKD Stage 3b (Goal: slow progression) ?-Not ideally controlled - pt last saw Dr Abigail Butts 03/2020, overdue for follow up ?-Discussed importance of BP control and hydration to avoid further kidney damage; advised to avoid NSAIDs ?-Advised pt to make follow up appt with nephrologist; pt indicated he would  call for appt (gave him phone number to office) ? ?Health Maintenance ?-Vaccine gaps: Prevnar, Flu, Shingrix, TDAP ?-pt has declined vaccines previously, "Not a vaccine person"  ?-Pt is due for annual visit with PCP May 2023. He indicated he would call to make appt. ? ?Patient Goals/Self-Care Activities ?Patient will:  ?- take medications as prescribed as evidenced by patient report and record review ?focus on medication adherence by routine ?check blood pressure peroidically, document, and provide at future appointments ?-Call nephrology for follow up appt ?  ?  ? ?The patient verbalized understanding of instructions, educational materials, and care plan provided today and declined offer to receive copy of patient instructions, educational materials, and care plan.  ?Telephone follow up appointment with pharmacy team member scheduled for: 6 months ? ?Charlene Brooke, PharmD, BCACP ?Clinical Pharmacist ?[facility] ?[phone] ?  ?

## 2021-12-11 ENCOUNTER — Ambulatory Visit (INDEPENDENT_AMBULATORY_CARE_PROVIDER_SITE_OTHER): Payer: Medicare Other | Admitting: General Practice

## 2021-12-11 ENCOUNTER — Encounter: Payer: Self-pay | Admitting: General Practice

## 2021-12-11 ENCOUNTER — Other Ambulatory Visit: Payer: Self-pay

## 2021-12-11 VITALS — BP 126/80 | HR 65 | Ht 73.0 in | Wt 160.0 lb

## 2021-12-11 DIAGNOSIS — I10 Essential (primary) hypertension: Secondary | ICD-10-CM

## 2021-12-11 DIAGNOSIS — I2510 Atherosclerotic heart disease of native coronary artery without angina pectoris: Secondary | ICD-10-CM

## 2021-12-11 DIAGNOSIS — N184 Chronic kidney disease, stage 4 (severe): Secondary | ICD-10-CM

## 2021-12-11 DIAGNOSIS — I5032 Chronic diastolic (congestive) heart failure: Secondary | ICD-10-CM

## 2021-12-11 NOTE — Patient Instructions (Signed)
Medication Instructions:  ?The current medical regimen is effective;  continue present plan and medications as directed. Please refer to the Current Medication list given to you today.  ? ?*If you need a refill on your cardiac medications before your next appointment, please call your pharmacy* ? ?Lab Work:   Testing/Procedures:  ?NONE    NONE ? ?Special Instructions ?MAINTAIN DIET ? ?MAY USE OVER-THE-COUNTER ANTIFUNGAL FOR YOUR FINGERNAIL ? ?PLEASE INCREASE PHYSICAL ACTIVITY AS TOLERATED  ? ?Follow-Up: ?Your next appointment:  9 month(s) In Person with [name], MD  ? ?Please call our office 2 months in advance to schedule this appointment  :1 ? ?At [facility], you and your health needs are our priority.  As part of our continuing mission to provide you with exceptional heart care, we have created designated Provider Care Teams.  These Care Teams include your primary Cardiologist (physician) and Advanced Practice Providers (APPs -  Physician Assistants and Nurse Practitioners) who all work together to provide you with the care you need, when you need it. ? ?We recommend signing up for the patient portal called "MyChart".  Sign up information is provided on this After Visit Summary.  MyChart is used to connect with patients for Virtual Visits (Telemedicine).  Patients are able to view lab/test results, encounter notes, upcoming appointments, etc.  Non-urgent messages can be sent to your provider as well.   ?To learn more about what you can do with MyChart, go to [email or web address].   ? ? ? ?      ?

## 2021-12-29 DIAGNOSIS — I10 Essential (primary) hypertension: Secondary | ICD-10-CM | POA: Diagnosis not present

## 2021-12-29 DIAGNOSIS — I5022 Chronic systolic (congestive) heart failure: Secondary | ICD-10-CM

## 2021-12-29 DIAGNOSIS — N183 Chronic kidney disease, stage 3 unspecified: Secondary | ICD-10-CM

## 2021-12-29 DIAGNOSIS — I2510 Atherosclerotic heart disease of native coronary artery without angina pectoris: Secondary | ICD-10-CM

## 2022-01-08 ENCOUNTER — Other Ambulatory Visit: Payer: Self-pay | Admitting: General Practice

## 2022-02-22 ENCOUNTER — Ambulatory Visit (INDEPENDENT_AMBULATORY_CARE_PROVIDER_SITE_OTHER): Payer: Medicare Other | Admitting: Primary Care

## 2022-02-22 ENCOUNTER — Encounter: Payer: Self-pay | Admitting: Primary Care

## 2022-02-22 VITALS — BP 140/68 | HR 68 | Temp 98.8°F | Ht 73.0 in | Wt 154.0 lb

## 2022-02-22 DIAGNOSIS — N183 Chronic kidney disease, stage 3 unspecified: Secondary | ICD-10-CM | POA: Diagnosis not present

## 2022-02-22 DIAGNOSIS — Z125 Encounter for screening for malignant neoplasm of prostate: Secondary | ICD-10-CM

## 2022-02-22 DIAGNOSIS — D509 Iron deficiency anemia, unspecified: Secondary | ICD-10-CM | POA: Diagnosis not present

## 2022-02-22 DIAGNOSIS — I2510 Atherosclerotic heart disease of native coronary artery without angina pectoris: Secondary | ICD-10-CM

## 2022-02-22 DIAGNOSIS — L97512 Non-pressure chronic ulcer of other part of right foot with fat layer exposed: Secondary | ICD-10-CM

## 2022-02-22 DIAGNOSIS — I5022 Chronic systolic (congestive) heart failure: Secondary | ICD-10-CM | POA: Diagnosis not present

## 2022-02-22 DIAGNOSIS — Z0000 Encounter for general adult medical examination without abnormal findings: Secondary | ICD-10-CM | POA: Diagnosis not present

## 2022-02-22 DIAGNOSIS — I739 Peripheral vascular disease, unspecified: Secondary | ICD-10-CM | POA: Diagnosis not present

## 2022-02-22 DIAGNOSIS — I10 Essential (primary) hypertension: Secondary | ICD-10-CM

## 2022-02-22 LAB — LIPID PANEL
Cholesterol: 159 mg/dL (ref 0–200)
HDL: 43 mg/dL (ref >39.00)
LDL Cholesterol: 96 mg/dL (ref 0–99)
NonHDL: 115.59
Total CHOL/HDL Ratio: 4
Triglycerides: 98 mg/dL (ref 0.0–149.0)
VLDL: 19.6 mg/dL (ref 0.0–40.0)

## 2022-02-22 LAB — COMPREHENSIVE METABOLIC PANEL
ALT: 19 U/L (ref 0–53)
AST: 20 U/L (ref 0–37)
Albumin: 4 g/dL (ref 3.5–5.2)
Alkaline Phosphatase: 57 U/L (ref 39–117)
BUN: 23 mg/dL (ref 6–23)
CO2: 30 mEq/L (ref 19–32)
Calcium: 9.3 mg/dL (ref 8.4–10.5)
Chloride: 107 mEq/L (ref 96–112)
Creatinine, Ser: 1.85 mg/dL — ABNORMAL HIGH (ref 0.40–1.50)
GFR: 35.1 mL/min — ABNORMAL LOW (ref >60.00)
Glucose, Bld: 85 mg/dL (ref 70–99)
Potassium: 4 mEq/L (ref 3.5–5.1)
Sodium: 143 mEq/L (ref 135–145)
Total Bilirubin: 0.5 mg/dL (ref 0.2–1.2)
Total Protein: 8.1 g/dL (ref 6.0–8.3)

## 2022-02-22 LAB — IBC + FERRITIN
Ferritin: 168.9 ng/mL (ref 22.0–322.0)
Iron: 46 ug/dL (ref 42–165)
Saturation Ratios: 20.7 % (ref 20.0–50.0)
TIBC: 222.6 ug/dL — ABNORMAL LOW (ref 250.0–450.0)
Transferrin: 159 mg/dL — ABNORMAL LOW (ref 212.0–360.0)

## 2022-02-22 LAB — PSA, MEDICARE: PSA: 4.75 ng/ml — ABNORMAL HIGH (ref 0.10–4.00)

## 2022-02-22 LAB — CBC
HCT: 38.4 % — ABNORMAL LOW (ref 39.0–52.0)
Hemoglobin: 12.5 g/dL — ABNORMAL LOW (ref 13.0–17.0)
MCHC: 32.5 g/dL (ref 30.0–36.0)
MCV: 91.9 fl (ref 78.0–100.0)
Platelets: 243 10*3/uL (ref 150.0–400.0)
RBC: 4.18 Mil/uL — ABNORMAL LOW (ref 4.22–5.81)
RDW: 14.8 % (ref 11.5–15.5)
WBC: 4.4 10*3/uL (ref 4.0–10.5)

## 2022-02-22 NOTE — Progress Notes (Signed)
Subjective:    Patient ID: Michael Chang, male    DOB: 1946/07/21, 76 y.o.   MRN: [id number]  HPI  Michael Chang is a very pleasant 76 y.o. male who presents today for complete physical and follow up of chronic conditions.   Following with cardiology for congestive heart failure and hypertension.  Last office visit was in March 2023.  During this visit no changes were made to his regimen.  He was encouraged to increase physical activity and improve his diet.  Currently managed on carvedilol 25 mg twice daily, hydralazine 100 mg twice daily, isosorbide mononitrate 30 mg daily, amlodipine 10 mg daily, aspirin 81 mg daily.  Today he denies chest pain, dizziness, lower extremity edema.   BP Readings from Last 3 Encounters:  02/22/22 140/68  12/11/21 126/80  06/01/21 126/80     Immunizations: -Influenza: Did not complete -Covid-19: Did not complete -Shingles: Declines  -Pneumonia: Declines  Diet: Fair diet. He is limiting salt intake.  Exercise: No regular exercise. Little activity at home.   Eye exam: Completes annually  Dental exam: Completed years ago  Colonoscopy: Declines  PSA: Due    Review of Systems  Constitutional:  Negative for unexpected weight change.  HENT:  Negative for rhinorrhea.   Respiratory:  Negative for cough and shortness of breath.   Cardiovascular:  Negative for chest pain.  Gastrointestinal:  Negative for constipation and diarrhea.  Genitourinary:  Negative for difficulty urinating.  Musculoskeletal:  Negative for arthralgias and myalgias.  Skin:  Positive for wound. Negative for rash.  Allergic/Immunologic: Negative for environmental allergies.  Neurological:  Negative for dizziness and headaches.  Psychiatric/Behavioral:  The patient is not nervous/anxious.         Past Medical History:  Diagnosis Date   Anemia    CAD (coronary artery disease)    Chronic combined systolic (congestive) and diastolic (congestive) heart failure (HCC)     Chronic ulcer of right lower extremity (HCC)    CKD (chronic kidney disease), stage III (HCC)    Essential hypertension    HLD (hyperlipidemia)    NSTEMI (non-ST elevated myocardial infarction) ([place])    PVD (peripheral vascular disease) ([place])     Social History   Socioeconomic History   Marital status: Single    Spouse name: Not on file   Number of children: Not on file   Years of education: Not on file   Highest education level: Not on file  Occupational History   Not on file  Tobacco Use   Smoking status: Never   Smokeless tobacco: Never  Substance and Sexual Activity   Alcohol use: Not Currently   Drug use: Not on file   Sexual activity: Not on file  Other Topics Concern   Not on file  Social History Narrative   Not on file   Social Determinants of Health   Financial Resource Strain: Low Risk    Difficulty of Paying Living Expenses: Not hard at all  Food Insecurity: No Food Insecurity   Worried About Running Out of Food in the Last Year: Never true   Ran Out of Food in the Last Year: Never true  Transportation Needs: No Transportation Needs   Lack of Transportation (Medical): No   Lack of Transportation (Non-Medical): No  Physical Activity: Insufficiently Active   Days of Exercise per Week: 4 days   Minutes of Exercise per Session: 20 min  Stress: No Stress Concern Present   Feeling of Stress : Not at all  Social Connections: Socially Isolated   Frequency of Communication with Friends and Family: Once a week   Frequency of Social Gatherings with Friends and Family: Once a week   Attends Religious Services: Never   Marine scientist or Organizations: No   Attends Music therapist: Never   Marital Status: Never married  Human resources officer Violence: Not At Risk   Fear of Current or Ex-Partner: No   Emotionally Abused: No   Physically Abused: No   Sexually Abused: No    History reviewed. No pertinent surgical history.  History reviewed. No  pertinent family history.  No Known Allergies  Current Outpatient Medications on File Prior to Visit  Medication Sig Dispense Refill   amLODipine (NORVASC) 10 MG tablet TAKE 1 TABLET BY MOUTH  DAILY 90 tablet 3   aspirin 81 MG EC tablet Take by mouth.     carvedilol (COREG) 25 MG tablet Take 0.5 tablets (12.5 mg total) by mouth every morning AND 1 tablet (25 mg total) every evening. 180 tablet 3   hydrALAZINE (APRESOLINE) 100 MG tablet TAKE 1 TABLET BY MOUTH  TWICE DAILY 180 tablet 3   isosorbide mononitrate (IMDUR) 30 MG 24 hr tablet TAKE 2 TABLETS BY MOUTH IN  THE MORNING AND TAKE  ONE-HALF TABLET BY MOUTH IN THE EVENING 225 tablet 3   Multiple Vitamins-Minerals (CENTRUM SILVER 50+MEN) TABS Take by mouth.     No current facility-administered medications on file prior to visit.    BP 140/68   Pulse 68   Temp 98.8 F (37.1 C) (Oral)   Ht 6\' 1"  (1.854 m)   Wt 154 lb (69.9 kg)   SpO2 95%   BMI 20.32 kg/m  Objective:   Physical Exam HENT:     Right Ear: Tympanic membrane and ear canal normal.     Left Ear: Tympanic membrane and ear canal normal.     Nose: Nose normal.     Right Sinus: No maxillary sinus tenderness or frontal sinus tenderness.     Left Sinus: No maxillary sinus tenderness or frontal sinus tenderness.  Eyes:     Conjunctiva/sclera: Conjunctivae normal.  Neck:     Thyroid: No thyromegaly.     Vascular: No carotid bruit.  Cardiovascular:     Rate and Rhythm: Normal rate and regular rhythm.     Heart sounds: Normal heart sounds.  Pulmonary:     Effort: Pulmonary effort is normal.     Breath sounds: Normal breath sounds. No wheezing or rales.  Abdominal:     General: Bowel sounds are normal.     Palpations: Abdomen is soft.     Tenderness: There is no abdominal tenderness.  Musculoskeletal:        General: Normal range of motion.     Cervical back: Neck supple.  Skin:    General: Skin is warm.     Comments: 2 X 1 cm Stage 2 ulcer to right medial ankle.  Surrounding erythema without warmth or drainage.   Neurological:     Mental Status: He is alert and oriented to person, place, and time.     Cranial Nerves: No cranial nerve deficit.     Deep Tendon Reflexes: Reflexes are normal and symmetric.  Psychiatric:        Mood and Affect: Mood normal.          Assessment & Plan:

## 2022-02-22 NOTE — Assessment & Plan Note (Signed)
Overall controlled.  Continue amlodipine 10 mg daily, hydralazine 100 mg twice daily, carvedilol 25 mg twice daily, isosorbide mononitrate mg daily.  CMP pending.

## 2022-02-22 NOTE — Assessment & Plan Note (Signed)
Asymptomatic.  Following with cardiology, office notes from March 2023 reviewed.  Continue isosorbide mononitrate 30 mg daily, blood pressure control, lipid control.

## 2022-02-22 NOTE — Assessment & Plan Note (Signed)
Repeat renal function pending. 

## 2022-02-22 NOTE — Assessment & Plan Note (Signed)
Ulceration has worsened to right medial ankle.  Strongly advised wound center referral and evaluation, he adamantly declines. Fortunately he does not appear to have an infection.

## 2022-02-22 NOTE — Patient Instructions (Signed)
Stop by the lab prior to leaving today. I will notify you of your results once received.   I strongly advise referral to the wound center, please notify me if you change your mind.  It was a pleasure to see you today!  Preventive Care 66 Years and Older, Male Preventive care refers to lifestyle choices and visits with your health care provider that can promote health and wellness. Preventive care visits are also called wellness exams. What can I expect for my preventive care visit? Counseling During your preventive care visit, your health care provider may ask about your: Medical history, including: Past medical problems. Family medical history. History of falls. Current health, including: Emotional well-being. Home life and relationship well-being. Sexual activity. Memory and ability to understand (cognition). Lifestyle, including: Alcohol, nicotine or tobacco, and drug use. Access to firearms. Diet, exercise, and sleep habits. Work and work Statistician. Sunscreen use. Safety issues such as seatbelt and bike helmet use. Physical exam Your health care provider will check your: Height and weight. These may be used to calculate your BMI (body mass index). BMI is a measurement that tells if you are at a healthy weight. Waist circumference. This measures the distance around your waistline. This measurement also tells if you are at a healthy weight and may help predict your risk of certain diseases, such as type 2 diabetes and high blood pressure. Heart rate and blood pressure. Body temperature. Skin for abnormal spots. What immunizations do I need?  Vaccines are usually given at various ages, according to a schedule. Your health care provider will recommend vaccines for you based on your age, medical history, and lifestyle or other factors, such as travel or where you work. What tests do I need? Screening Your health care provider may recommend screening tests for certain  conditions. This may include: Lipid and cholesterol levels. Diabetes screening. This is done by checking your blood sugar (glucose) after you have not eaten for a while (fasting). Hepatitis C test. Hepatitis B test. HIV (human immunodeficiency virus) test. STI (sexually transmitted infection) testing, if you are at risk. Lung cancer screening. Colorectal cancer screening. Prostate cancer screening. Abdominal aortic aneurysm (AAA) screening. You may need this if you are a current or former smoker. Talk with your health care provider about your test results, treatment options, and if necessary, the need for more tests. Follow these instructions at home: Eating and drinking  Eat a diet that includes fresh fruits and vegetables, whole grains, lean protein, and low-fat dairy products. Limit your intake of foods with high amounts of sugar, saturated fats, and salt. Take vitamin and mineral supplements as recommended by your health care provider. Do not drink alcohol if your health care provider tells you not to drink. If you drink alcohol: Limit how much you have to 0-2 drinks a day. Know how much alcohol is in your drink. In the [place] one drink equals one 12 oz bottle of beer (355 mL), one 5 oz glass of wine (148 mL), or one 1 oz glass of hard liquor (44 mL). Lifestyle Brush your teeth every morning and night with fluoride toothpaste. Floss one time each day. Exercise for at least 30 minutes 5 or more days each week. Do not use any products that contain nicotine or tobacco. These products include cigarettes, chewing tobacco, and vaping devices, such as e-cigarettes. If you need help quitting, ask your health care provider. Do not use drugs. If you are sexually active, practice safe sex. Use a condom  or other form of protection to prevent STIs. Take aspirin only as told by your health care provider. Make sure that you understand how much to take and what form to take. Work with your health care  provider to find out whether it is safe and beneficial for you to take aspirin daily. Ask your health care provider if you need to take a cholesterol-lowering medicine (statin). Find healthy ways to manage stress, such as: Meditation, yoga, or listening to music. Journaling. Talking to a trusted person. Spending time with friends and family. Safety Always wear your seat belt while driving or riding in a vehicle. Do not drive: If you have been drinking alcohol. Do not ride with someone who has been drinking. When you are tired or distracted. While texting. If you have been using any mind-altering substances or drugs. Wear a helmet and other protective equipment during sports activities. If you have firearms in your house, make sure you follow all gun safety procedures. Minimize exposure to UV radiation to reduce your risk of skin cancer. What's next? Visit your health care provider once a year for an annual wellness visit. Ask your health care provider how often you should have your eyes and teeth checked. Stay up to date on all vaccines. This information is not intended to replace advice given to you by your health care provider. Make sure you discuss any questions you have with your health care provider. Document Revised: 03/15/2021 Document Reviewed: 03/15/2021 Elsevier Patient Education  [place].

## 2022-02-22 NOTE — Assessment & Plan Note (Signed)
Repeat CBC pending. 

## 2022-02-22 NOTE — Assessment & Plan Note (Signed)
Declines all immunizations despite recommendations. Declines colonoscopy evaluation. PSA due and pending.  Encouraged healthy diet regular exercise.  Exam today as noted, labs pending.

## 2022-02-22 NOTE — Assessment & Plan Note (Signed)
Worse compared to prior visits.  Strongly advised a referral to the wound clinic for evaluation and treatment.  He adamantly declines despite my recommendations.  He will continue to treat his wound at home, we discussed strict return precautions.

## 2022-02-22 NOTE — Assessment & Plan Note (Addendum)
Appears euvolemic. Following with cardiology, office notes from March 2020 reviewed.  Continue blood pressure control. Continue carvedilol 25 mg twice daily.

## 2022-02-27 ENCOUNTER — Other Ambulatory Visit: Payer: Self-pay | Admitting: Primary Care

## 2022-02-27 ENCOUNTER — Telehealth: Payer: Self-pay | Admitting: Primary Care

## 2022-02-27 DIAGNOSIS — R972 Elevated prostate specific antigen [PSA]: Secondary | ICD-10-CM

## 2022-02-27 NOTE — Telephone Encounter (Signed)
Pt is requesting call back from nurse about lab results. He said he has questions. Call back is [phone]

## 2022-02-27 NOTE — Telephone Encounter (Signed)
Patient called wanted to cancel his lab appointment that we made for 6 months. Asked patient if he wanted me to move to a different time/date. He declined. Will call back at later time to reschedule.

## 2022-02-27 NOTE — Telephone Encounter (Signed)
Noted.  Hopefully he will call back to reschedule.

## 2022-03-06 ENCOUNTER — Telehealth: Payer: Self-pay

## 2022-03-06 NOTE — Progress Notes (Signed)
Chronic Care Management Pharmacy Assistant   Name: Michael Chang  MRN: [id number] DOB: 1946-06-15  Reason for Encounter: CCM (Hypertension Disease State)  Recent office visits:  02/22/22 Tera Helper Annual Exam: Abnormal Labs: "Blood counts appear stable. Kidney function is slightly better.  Continue regular follow-up with kidney doctor. Prostate level is slightly elevated, I would like to repeat this again in 6 months."   Recent consult visits:  12/11/21 [name], NP (Cardiology) HTN Ordered: EKG No med changes FU 6-9 months  Hospital visits:  None in previous 6 months  Medications: Outpatient Encounter Medications as of 03/06/2022  Medication Sig   amLODipine (NORVASC) 10 MG tablet TAKE 1 TABLET BY MOUTH  DAILY   aspirin 81 MG EC tablet Take by mouth.   carvedilol (COREG) 25 MG tablet Take 0.5 tablets (12.5 mg total) by mouth every morning AND 1 tablet (25 mg total) every evening.   hydrALAZINE (APRESOLINE) 100 MG tablet TAKE 1 TABLET BY MOUTH  TWICE DAILY   isosorbide mononitrate (IMDUR) 30 MG 24 hr tablet TAKE 2 TABLETS BY MOUTH IN  THE MORNING AND TAKE  ONE-HALF TABLET BY MOUTH IN THE EVENING   Multiple Vitamins-Minerals (CENTRUM SILVER 50+MEN) TABS Take by mouth.   No facility-administered encounter medications on file as of 03/06/2022.    Recent Office Vitals: BP Readings from Last 3 Encounters:  02/22/22 140/68  12/11/21 126/80  06/01/21 126/80   Pulse Readings from Last 3 Encounters:  02/22/22 68  12/11/21 65  06/01/21 60    Wt Readings from Last 3 Encounters:  02/22/22 154 lb (69.9 kg)  12/11/21 160 lb (72.6 kg)  10/26/21 154 lb (69.9 kg)     Kidney Function Lab Results  Component Value Date/Time   CREATININE 1.85 (H) 02/22/2022 12:12 PM   CREATININE 1.94 (H) 02/10/2021 09:36 AM   GFR 35.10 (L) 02/22/2022 12:12 PM   GFRNONAA 31 (L) 02/15/2020 05:16 AM   GFRAA 36 (L) 02/15/2020 05:16 AM       Latest Ref Rng & Units 02/22/2022   12:12 PM  02/10/2021    9:36 AM 02/15/2020    5:16 AM  BMP  Glucose 70 - 99 mg/dL 85   92   91    BUN 6 - 23 mg/dL 23   19   43    Creatinine 0.40 - 1.50 mg/dL 1.85   1.94   2.06    Sodium 135 - 145 mEq/L 143   143   140    Potassium 3.5 - 5.1 mEq/L 4.0   4.5   4.2    Chloride 96 - 112 mEq/L 107   106   106    CO2 19 - 32 mEq/L 30   28   26     Calcium 8.4 - 10.5 mg/dL 9.3   9.5   8.6      Contacted patient on 03/08/2022 to discuss hypertension disease state  Recommendations from previous CCM appointment:  The following recommendations review with patient: -Advised pt to follow up with nephrology - gave phone number for office and pt indicated he would call  -Advised PCP annual visit is due May 2023, pt indicated he has to wait for his sister to schedule appt and he will schedule on same day since she drives him   Current antihypertensive regimen:  Amlodipine 10 mg daily AM  Carvedilol 25 mg - 1/2 tab AM, 1 tab PM  Hydralazine 100 mg BID  Isosorbide [place] 30 mg -  2 tab AM, 1/2 tab PM   Patient verbally confirms he is taking the above medications as directed. Yes  How often are you checking your Blood Pressure? infrequently - I have asked patient to take their blood pressure daily and keep a log. Advised patient I would call back on 03/16/2022 for log. Patient verbalized understanding and agreed.   Wrist or arm cuff: Arm Cuff Caffeine intake: Decaf coffee only Salt intake: Does not add salt to food  Over the counter medications including pseudoephedrine or NSAIDs? No  Any readings above 180/120? No  What recent interventions/DTPs have been made by any provider to improve Blood Pressure control since last CPP Visit:  Counseled to monitor BP at home periodically Recommend to continue current medication  Any recent hospitalizations or ED visits since last visit with CPP? No  What diet changes have been made to improve Blood Pressure Control?  Patient tries to eat well and watches his salt. He  limits bad foods that he knows he should not eat.  What exercise is being done to improve your Blood Pressure Control?  Patient will walk occassionally usually while shopping.  Adherence Review: Is the patient currently on ACE/ARB medication? No Does the patient have >5 day gap between last estimated fill dates? N/A  Star Rating Drugs:  Medication:  Last Fill: Day Supply No Star Rating Drugs Noted  Care Gaps: Annual wellness visit in last year? Yes 10/26/21 Most Recent BP reading: 140/68 on 02/22/2022  Upcoming appointments: CCM appointment on 06/05/2022  Charlene Brooke, CPP notified  Marijean Niemann, [place] Assistant [phone]

## 2022-03-15 NOTE — Progress Notes (Addendum)
Called patient for his blood pressure log as previously discussed. Patient only had three readings to give.   Date Blood Pressure 06/12 122/82 06/11 128/79 06/10 126/83  Charlene Brooke, CPP notified  Marijean Niemann, [place] Clinical Pharmacy Assistant [phone]

## 2022-04-03 ENCOUNTER — Other Ambulatory Visit: Payer: Self-pay | Admitting: Cardiovascular Disease

## 2022-04-04 NOTE — Telephone Encounter (Signed)
Rx request sent to pharmacy.  

## 2022-04-23 DIAGNOSIS — H4423 Degenerative myopia, bilateral: Secondary | ICD-10-CM | POA: Diagnosis not present

## 2022-04-23 DIAGNOSIS — H524 Presbyopia: Secondary | ICD-10-CM | POA: Diagnosis not present

## 2022-04-23 DIAGNOSIS — H15833 Staphyloma posticum, bilateral: Secondary | ICD-10-CM | POA: Diagnosis not present

## 2022-04-23 DIAGNOSIS — H5203 Hypermetropia, bilateral: Secondary | ICD-10-CM | POA: Diagnosis not present

## 2022-04-23 DIAGNOSIS — Z961 Presence of intraocular lens: Secondary | ICD-10-CM | POA: Diagnosis not present

## 2022-04-23 DIAGNOSIS — H18051 Posterior corneal pigmentations, right eye: Secondary | ICD-10-CM | POA: Diagnosis not present

## 2022-05-02 ENCOUNTER — Other Ambulatory Visit: Payer: Self-pay | Admitting: General Practice

## 2022-05-02 NOTE — Telephone Encounter (Signed)
Rx(s) sent to pharmacy electronically.  

## 2022-05-31 ENCOUNTER — Telehealth: Payer: Self-pay

## 2022-05-31 NOTE — Progress Notes (Signed)
    Chronic Care Management Pharmacy Assistant   Name: Doroteo Nickolson  MRN: [id number] DOB: 1946/02/14  Reason for Encounter: CCM (Appointment Reminder)  Medications: Outpatient Encounter Medications as of 05/31/2022  Medication Sig   amLODipine (NORVASC) 10 MG tablet TAKE 1 TABLET BY MOUTH  DAILY   aspirin 81 MG EC tablet Take by mouth.   carvedilol (COREG) 25 MG tablet TAKE ONE-HALF TABLET BY  MOUTH IN THE MORNING AND 1  TABLET BY MOUTH IN THE  EVENING   hydrALAZINE (APRESOLINE) 100 MG tablet TAKE 1 TABLET BY MOUTH  TWICE DAILY   isosorbide mononitrate (IMDUR) 30 MG 24 hr tablet TAKE 2 TABLETS BY MOUTH IN  THE MORNING AND TAKE  ONE-HALF TABLET BY MOUTH IN THE EVENING   Multiple Vitamins-Minerals (CENTRUM SILVER 50+MEN) TABS Take by mouth.   No facility-administered encounter medications on file as of 05/31/2022.   Coda Mathey was contacted to remind of upcoming telephone visit with Charlene Brooke on 06/05/2022 at 3:00. Patient was reminded to have any blood glucose and blood pressure readings available for review at appointment.   Patient confirmed appointment.  Are you having any problems with your medications? No   Do you have any concerns you like to discuss with the pharmacist? No  CCM referral has been placed prior to visit?  No   Star Rating Drugs: Medication:  Last Fill: Day Supply No Star Rating Drugs  Charlene Brooke, CPP notified  Marijean Niemann, [place] Pharmacy Assistant [phone]

## 2022-06-05 ENCOUNTER — Ambulatory Visit: Payer: Medicare Other | Admitting: Pharmacist

## 2022-06-05 DIAGNOSIS — I5022 Chronic systolic (congestive) heart failure: Secondary | ICD-10-CM

## 2022-06-05 DIAGNOSIS — I2510 Atherosclerotic heart disease of native coronary artery without angina pectoris: Secondary | ICD-10-CM

## 2022-06-05 DIAGNOSIS — I739 Peripheral vascular disease, unspecified: Secondary | ICD-10-CM

## 2022-06-05 DIAGNOSIS — N183 Chronic kidney disease, stage 3 unspecified: Secondary | ICD-10-CM

## 2022-06-05 DIAGNOSIS — I10 Essential (primary) hypertension: Secondary | ICD-10-CM

## 2022-06-05 NOTE — Progress Notes (Signed)
Chronic Care Management Pharmacy Note  06/05/2022 Name:  Michael Chang MRN:  [id number] DOB:  Jun 25, 1946  Summary: CCM F/U visit -Reviewed medications; pt affirms compliance as prescribed -BP is at goal; pt reports home SBP 120s-130s -Pt is not on a statin despite history of NSTEMI, discussed benefits multiple times and pt again declines statin -Per PCP annual visit 01/2022 - PSA was slightly elevated, due for repeat PSA 6 months  Recommendations/Changes made from today's visit: -No med changes -Scheduled lab appt 09/06/22 for repeat PSA  Plan: -Transition CCM to Self Care: Patient achieved CCM goals and no longer needs to be contacted as frequently. The patient has been provided with contact information for the care management team and has been advised to call with any health related questions or concerns.      Subjective: Michael Chang is an 76 y.o. year old male who is a primary patient of Michael Koch, NP.  The CCM team was consulted for assistance with disease management and care coordination needs.    Engaged with patient by telephone for follow up visit in response to provider referral for pharmacy case management and/or care coordination services.   Consent to Services:  The patient was given information about Chronic Care Management services, agreed to services, and gave verbal consent prior to initiation of services.  Please see initial visit note for detailed documentation.   Patient Care Team: Michael Koch, NP as PCP - General (Internal Medicine) Michael Hampshire, MD as PCP - Cardiology (Cardiology) Michael Chang, [facility] as Pharmacist (Pharmacist)  Patient moved to [facility] from [place] in 11/2019 to be closer to his sister. He was not taking care of himself in [place] and had been without medical care since 2007 prior to NSTEMI in 11/2019. He lives by himself now but sister is close by. He is doing better taking care of himself and manages his own medicines using a pill  box.  Recent office visits: 02/22/22 NP Michael Chang OV: annual - strongly advised wound center for PAD ulcer, pt declined. Repeat PSA 6 months.   02/10/21-PCP-Michael Clark,NP-Patient presented for AWV.Labs ordered,discussed vaccines and screenings,follow up with wound care, cardiology and nephrology. No mediction changes.  Recent consult visits: 12/11/21 NP Michael Chang (Cardiology): f/u CAD, HF. No changes. Statin not mentioned.  06/01/21-Cardiology- Michael Chang presented for follow up CHF, HTN. BP improved after carvedilol increased last time. F/U 6 months. 03/30/21-Optometry- no data found 03/16/21-Cardiology-Michael Cleaver,NP-Patient presented for follow up hypertension, CHF. Increased carvedilol to 25 mg at night. Keep BP log. F/u 2 months.   Hospital visits: None in previous 6 months   Objective:  Lab Results  Component Value Date   CREATININE 1.85 (H) 02/22/2022   BUN 23 02/22/2022   GFR 35.10 (L) 02/22/2022   GFRNONAA 31 (L) 02/15/2020   GFRAA 36 (L) 02/15/2020   NA 143 02/22/2022   K 4.0 02/22/2022   CALCIUM 9.3 02/22/2022   CO2 30 02/22/2022   GLUCOSE 85 02/22/2022    Lab Results  Component Value Date/Time   GFR 35.10 (L) 02/22/2022 12:12 PM   GFR 33.39 (L) 02/10/2021 09:36 AM    Last diabetic Eye exam: No results found for: "HMDIABEYEEXA"  Last diabetic Foot exam: No results found for: "HMDIABFOOTEX"   Lab Results  Component Value Date   CHOL 159 02/22/2022   HDL 43.00 02/22/2022   LDLCALC 96 02/22/2022   TRIG 98.0 02/22/2022   CHOLHDL 4 02/22/2022  Latest Ref Rng & Units 02/22/2022   12:12 PM 02/10/2021    9:36 AM 02/14/2020    2:47 AM  Hepatic Function  Total Protein 6.0 - 8.3 g/dL 8.1  8.2  8.3   Albumin 3.5 - 5.2 g/dL 4.0  4.1  2.9   AST 0 - 37 U/L _0 ALT 0 - 53 U/L _1 Alk Phosphatase 39 - 117 U/L 57  62  51   Total Bilirubin 0.2 - 1.2 mg/dL 0.5  0.5  0.6     Lab Results  Component Value Date/Time   TSH  1.79 02/10/2021 09:36 AM   TSH 4.17 01/05/2020 10:28 AM       Latest Ref Rng & Units 02/22/2022   12:12 PM 02/10/2021    9:36 AM 02/18/2020   10:54 AM  CBC  WBC 4.0 - 10.5 K/uL 4.4  4.3  6.0   Hemoglobin 13.0 - 17.0 g/dL 12.5  12.6  9.2   Hematocrit 39.0 - 52.0 % 38.4  38.1  28.3   Platelets 150.0 - 400.0 K/uL 243.0  224.0  278.0     No results found for: "VD25OH"  Clinical ASCVD: Yes  The ASCVD Risk score (Arnett DK, et al., 2019) failed to calculate for the following reasons:   The patient has a prior MI or stroke diagnosis       02/22/2022   11:23 AM 10/26/2021    9:51 AM 02/10/2021    8:28 AM  Depression screen PHQ 2/9  Decreased Interest 0 0 0  Down, Depressed, Hopeless 0 0 0  PHQ - 2 Score 0 0 0  Altered sleeping 0  0  Tired, decreased energy 0  0  Change in appetite 0  0  Feeling bad or failure about yourself  0  0  Trouble concentrating 0  0  Moving slowly or fidgety/restless 0  0  Suicidal thoughts 0  0  PHQ-9 Score 0  0  Difficult doing work/chores Not difficult at all  Not difficult at all     Social History   Tobacco Use  Smoking Status Never  Smokeless Tobacco Never   BP Readings from Last 3 Encounters:  02/22/22 140/68  12/11/21 126/80  06/01/21 126/80   Pulse Readings from Last 3 Encounters:  02/22/22 68  12/11/21 65  06/01/21 60   Wt Readings from Last 3 Encounters:  02/22/22 154 lb (69.9 kg)  12/11/21 160 lb (72.6 kg)  10/26/21 154 lb (69.9 kg)   BMI Readings from Last 3 Encounters:  02/22/22 20.32 kg/m  12/11/21 21.11 kg/m  10/26/21 20.32 kg/m    Assessment/Interventions: Review of patient past medical history, allergies, medications, health status, including review of consultants reports, laboratory and other test data, was performed as part of comprehensive evaluation and provision of chronic care management services.   SDOH:  (Social Determinants of Health) assessments and interventions performed: No - done 10/2021 SDOH  Interventions    Flowsheet Row Chronic Care Management from 10/10/2021 in [place] at [place] from 02/17/2020 in [place] at [place]  SDOH Interventions    Depression Interventions/Treatment  -- RAQ7-6 Score <4 Follow-up Not Indicated  Financial Strain Interventions Intervention Not Indicated --       [place]: No Food Insecurity (10/26/2021)  Housing: Low Risk  (10/26/2021)  Transportation Needs: No Transportation Needs (10/26/2021)  Alcohol Screen: Low Risk  (  10/26/2021)  Depression (PHQ2-9): Low Risk  (02/22/2022)  Financial Resource Strain: Low Risk  (10/26/2021)  Physical Activity: Insufficiently Active (10/26/2021)  Social Connections: Socially Isolated (10/26/2021)  Stress: No Stress Concern Present (10/26/2021)  Tobacco Use: Low Risk  (02/22/2022)    CCM Care Plan  No Known Allergies  Medications Reviewed Today     Reviewed by Michael Chang, [facility] (Pharmacist) on 06/05/22 at 1520  Med List Status: <None>   Medication Order Taking? Sig Documenting Provider Last Dose Status Informant  amLODipine (NORVASC) 10 MG tablet [id number] Yes TAKE 1 TABLET BY MOUTH  DAILY Deberah Pelton, NP Taking Active   aspirin 81 MG EC tablet [id number] Yes Take by mouth. [provider] Taking Active   carvedilol (COREG) 25 MG tablet [id number] Yes TAKE ONE-HALF TABLET BY  MOUTH IN THE MORNING AND 1  TABLET BY MOUTH IN THE  EVENING Chang, Jossie Ng, NP Taking Active   ferrous sulfate 325 (65 FE) MG tablet [id number] Yes Take 325 mg by mouth daily with breakfast. [provider] Taking Active   hydrALAZINE (APRESOLINE) 100 MG tablet [id number] Yes TAKE 1 TABLET BY MOUTH  TWICE DAILY Michael Hampshire, MD Taking Active   isosorbide mononitrate (IMDUR) 30 MG 24 hr tablet [id number] Yes TAKE 2 TABLETS BY MOUTH IN  THE MORNING AND TAKE  ONE-HALF TABLET BY MOUTH IN THE Gwenlyn Fudge, MD Taking Active    Multiple Vitamins-Minerals (CENTRUM SILVER 50+MEN) TABS [id number] Yes Take by mouth. [provider] Taking Active             Patient Active Problem List   Diagnosis Date Noted   Preventative health care 02/10/2021   Follow-up examination after eye surgery 03/22/2020   Retinal holes, right 03/19/2020   Secondary corneal edema, right 03/15/2020   Aphakia of eye, right 03/15/2020   Cataract (lens) fragments in eye following cataract surgery, right eye 03/15/2020   Anemia in chronic kidney disease 03/02/2020   Benign hypertensive kidney disease with chronic kidney disease 03/02/2020   Proteinuria 03/02/2020   Iron deficiency anemia    Weakness    Near syncope 02/14/2020   Cardiomyopathy, ischemic 02/14/2020   Skin ulcer of right foot ([place]) 01/07/2020   CAD (coronary artery disease) 01/07/2020   History of non-ST elevation myocardial infarction (NSTEMI) 01/05/2020   Congestive heart failure ([place]) 01/05/2020   PAD (peripheral artery disease) ([place]) 01/05/2020   Chronic ulcer of right lower extremity with fat layer exposed ([place]) 01/05/2020   Chronic kidney disease 01/05/2020   Essential hypertension 01/05/2020   Elevated TSH 01/05/2020     There is no immunization history on file for this patient.  Conditions to be addressed/monitored:  Hypertension, Hyperlipidemia, Heart Failure, Coronary Artery Disease, and Chronic Kidney Disease  Care Plan : [place]  Updates made by Michael Chang, [place] since 06/05/2022 12:00 AM     Problem: Hypertension, Hyperlipidemia, Heart Failure, Coronary Artery Disease, and Chronic Kidney Disease   Priority: High     Long-Range Goal: Disease mgmt   Start Date: 09/13/2021  Expected End Date: 09/13/2022  Recent Progress: On track  Priority: High  Note:   Current Barriers:  Suboptimal therapeutic regimen for CAD  Pharmacist Clinical Goal(s):  Patient will adhere to plan to optimize therapeutic regimen for CAD as  evidenced by report of adherence to recommended medication management changes through collaboration with PharmD and provider.   Interventions: 1:1 collaboration with Michael Koch, NP regarding development  and update of comprehensive plan of care as evidenced by provider attestation and co-signature Inter-disciplinary care team collaboration (see longitudinal plan of care) Comprehensive medication review performed; medication list updated in electronic medical record  Hypertension / Heart Failure (BP goal <130/80) -Controlled - pt reports BP is improved with current regimen; he is not checking BP often but typically when he checks it is 130s/80s; he reports he feels better when BP is somewhat higher, he gets dizzy/weak when BP is ~120/70 -Home BP: 120s-130s/80s, he checks periodicaly -Last ejection fraction: 55-60% (Date: 02/14/20) -HF type: Diastolic (Grade I dysfunction); NYHA Class: I (no actitivty limitation) -Current treatment: Amlodipine 10 mg daily AM - Appropriate, Effective, Safe, Accessible Carvedilol 25 mg - 1/2 tab AM, 1 tab PM -Appropriate, Effective, Safe, Accessible Hydralazine 100 mg BID -Appropriate, Effective, Safe, Accessible Isosorbide MN 30 mg - 2 tab AM, 1/2 tab PM -Appropriate, Effective, Safe, Accessible -Educated on BP goals and benefits of medications for prevention of heart attack, stroke and kidney damage; Daily salt intake goal < 2300 mg; Exercise goal of 150 minutes per week; Importance of home blood pressure monitoring; -Counseled to monitor BP at home periodically -Recommend to continue current medication  Hyperlipidemia / CAD / PAD (LDL goal < 70) -Not ideally controlled - LDL is above goal; pt declined statin trial 12/2019; pt would benefit from statin given CAD/PAD; he had agreed to start statin last visit but then declined when PCP assistant called. Today he reports he has not changed his mind but has cardiology appt next week and will discuss then -Hx  NSTEMI (11/2019 in [place]) medically managed; hx PAD w/ chronic leg ulcer -Current treatment: Aspirin 81 mg daily -Appropriate, Effective, Safe, Accessible -Educated on Cholesterol goals; Benefits of statin for ASCVD risk reduction; -Recommend statin therapy; pt declined  CKD Stage 3b (Goal: slow progression) -Not ideally controlled - pt last saw Dr Abigail Butts 03/2020, overdue for follow up -Discussed importance of BP control and hydration to avoid further kidney damage; advised to avoid NSAIDs -Advised pt to make follow up appt with nephrologist  Health Maintenance -Vaccine gaps: Prevnar, Flu, Shingrix, TDAP -pt has declined vaccines previously, "Not a vaccine person"  -PSA was slightly elevated 01/2022, PCP advised repeat PSA in 6 months; scheduled lab appt 09/06/22  Patient Goals/Self-Care Activities Patient will:  - take medications as prescribed as evidenced by patient report and record review focus on medication adherence by routine check blood pressure peroidically, document, and provide at future appointments -Call nephrology for follow up appt       Medication Assistance: None required.  Patient affirms current coverage meets needs.  Compliance/Adherence/Medication fill history: Care Gaps: None  Star-Rating Drugs: None  Medication Access: Within the past 30 days, how often has patient missed a dose of medication? 0 Is a pillbox or other method used to improve adherence? No  Factors that may affect medication adherence? no barriers identified Are meds synced by current pharmacy? No  Are meds delivered by current pharmacy? Yes  Does patient experience delays in picking up medications due to transportation concerns? No   Upstream Services Reviewed: Is patient disadvantaged to use [facility] Pharmacy?: Yes  Current Rx insurance plan: [facility] Name and location of Current pharmacy:  [facility] - [place], [address] [place] [address] Phone: [phone] Fax: [phone]  [facility] Pharmacy services reviewed with patient today?: No  Patient requests to transfer care to  Upstream Pharmacy?: No  Reason patient declined to change pharmacies: Disadvantaged due to insurance/mail order   Care Plan and Follow Up Patient Decision:  Patient agrees to Care Plan and Follow-up.  Plan: The patient has been provided with contact information for the care management team and has been advised to call with any health related questions or concerns.   Charlene Brooke, PharmD, BCACP Clinical Pharmacist [facility] at [facility] [phone]

## 2022-06-05 NOTE — Patient Instructions (Signed)
Visit Information  Phone number for Pharmacist: [phone]   Goals Addressed   None     Care Plan : [place]  Updates made by Charlton Haws, [place] since 06/05/2022 12:00 AM     Problem: Hypertension, Hyperlipidemia, Heart Failure, Coronary Artery Disease, and Chronic Kidney Disease   Priority: High     Long-Range Goal: Disease mgmt   Start Date: 09/13/2021  Expected End Date: 09/13/2022  Recent Progress: On track  Priority: High  Note:   Current Barriers:  Suboptimal therapeutic regimen for CAD  Pharmacist Clinical Goal(s):  Patient will adhere to plan to optimize therapeutic regimen for CAD as evidenced by report of adherence to recommended medication management changes through collaboration with PharmD and provider.   Interventions: 1:1 collaboration with [name], NP regarding development and update of comprehensive plan of care as evidenced by provider attestation and co-signature Inter-disciplinary care team collaboration (see longitudinal plan of care) Comprehensive medication review performed; medication list updated in electronic medical record  Hypertension / Heart Failure (BP goal <130/80) -Controlled - pt reports BP is improved with current regimen; he is not checking BP often but typically when he checks it is 130s/80s; he reports he feels better when BP is somewhat higher, he gets dizzy/weak when BP is ~120/70 -Home BP: 120s-130s/80s, he checks periodicaly -Last ejection fraction: 55-60% (Date: 02/14/20) -HF type: Diastolic (Grade I dysfunction); NYHA Class: I (no actitivty limitation) -Current treatment: Amlodipine 10 mg daily AM - Appropriate, Effective, Safe, Accessible Carvedilol 25 mg - 1/2 tab AM, 1 tab PM -Appropriate, Effective, Safe, Accessible Hydralazine 100 mg BID -Appropriate, Effective, Safe, Accessible Isosorbide MN 30 mg - 2 tab AM, 1/2 tab PM -Appropriate, Effective, Safe, Accessible -Educated on BP goals and  benefits of medications for prevention of heart attack, stroke and kidney damage; Daily salt intake goal < 2300 mg; Exercise goal of 150 minutes per week; Importance of home blood pressure monitoring; -Counseled to monitor BP at home periodically -Recommend to continue current medication  Hyperlipidemia / CAD / PAD (LDL goal < 70) -Not ideally controlled - LDL is above goal; pt declined statin trial 12/2019; pt would benefit from statin given CAD/PAD; he had agreed to start statin last visit but then declined when PCP assistant called. Today he reports he has not changed his mind but has cardiology appt next week and will discuss then -Hx NSTEMI (11/2019 in [place]) medically managed; hx PAD w/ chronic leg ulcer -Current treatment: Aspirin 81 mg daily -Appropriate, Effective, Safe, Accessible -Educated on Cholesterol goals; Benefits of statin for ASCVD risk reduction; -Recommend statin therapy; pt declined  CKD Stage 3b (Goal: slow progression) -Not ideally controlled - pt last saw Dr Abigail Butts 03/2020, overdue for follow up -Discussed importance of BP control and hydration to avoid further kidney damage; advised to avoid NSAIDs -Advised pt to make follow up appt with nephrologist  Health Maintenance -Vaccine gaps: Prevnar, Flu, Shingrix, TDAP -pt has declined vaccines previously, "Not a vaccine person"  -PSA was slightly elevated 01/2022, PCP advised repeat PSA in 6 months; scheduled lab appt 09/06/22  Patient Goals/Self-Care Activities Patient will:  - take medications as prescribed as evidenced by patient report and record review focus on medication adherence by routine check blood pressure peroidically, document, and provide at future appointments -Call nephrology for follow up appt      The patient verbalized understanding of instructions, educational materials, and care plan provided today and DECLINED offer to receive copy of patient instructions,  educational materials, and care plan.  The  patient has been provided with contact information for the care management team and has been advised to call with any health related questions or concerns.   Charlene Brooke, PharmD, BCACP Clinical Pharmacist [facility] at [facility] [phone]

## 2022-08-04 ENCOUNTER — Other Ambulatory Visit: Payer: Self-pay | Admitting: General Practice

## 2022-08-31 ENCOUNTER — Other Ambulatory Visit: Payer: Medicare Other

## 2022-09-06 ENCOUNTER — Other Ambulatory Visit: Payer: Medicare Other

## 2022-09-06 ENCOUNTER — Other Ambulatory Visit (INDEPENDENT_AMBULATORY_CARE_PROVIDER_SITE_OTHER): Payer: Medicare Other

## 2022-09-06 DIAGNOSIS — R972 Elevated prostate specific antigen [PSA]: Secondary | ICD-10-CM

## 2022-09-06 LAB — PSA, MEDICARE: PSA: 5.21 ng/ml — ABNORMAL HIGH (ref 0.10–4.00)

## 2022-09-10 ENCOUNTER — Other Ambulatory Visit: Payer: Self-pay | Admitting: Primary Care

## 2022-09-10 DIAGNOSIS — R972 Elevated prostate specific antigen [PSA]: Secondary | ICD-10-CM

## 2022-10-13 ENCOUNTER — Other Ambulatory Visit: Payer: Self-pay | Admitting: General Practice

## 2022-10-29 ENCOUNTER — Ambulatory Visit (INDEPENDENT_AMBULATORY_CARE_PROVIDER_SITE_OTHER): Payer: Medicare Other

## 2022-10-29 VITALS — Ht 73.0 in | Wt 154.0 lb

## 2022-10-29 DIAGNOSIS — Z0000 Encounter for general adult medical examination without abnormal findings: Secondary | ICD-10-CM

## 2022-10-29 NOTE — Addendum Note (Signed)
Addended by: Leta Jungling on: 10/29/2022 06:03 PM   Modules accepted: Level of Service

## 2022-10-29 NOTE — Patient Instructions (Addendum)
Michael Chang , Thank you for taking time to come for your Medicare Wellness Visit. I appreciate your ongoing commitment to your health goals. Please review the following plan we discussed and let me know if I can assist you in the future.   These are the goals we discussed:  Goals Addressed               This Visit's Progress     Patient Stated     Maintain healthy lifestyle (pt-stated)        Stay active Healthy diet Stay hydrated         This is a list of the screening recommended for you and due dates:  Health Maintenance  Topic Date Due   DTaP/Tdap/Td vaccine (1 - Tdap) Never done   Flu Shot  12/30/2022*   Medicare Annual Wellness Visit  10/30/2023   Hepatitis C Screening: USPSTF Recommendation to screen - Ages 77-77 yo.  Completed   HPV Vaccine  Aged Out   Pneumonia Vaccine  Discontinued   Colon Cancer Screening  Discontinued   COVID-19 Vaccine  Discontinued   Zoster (Shingles) Vaccine  Discontinued  *Topic was postponed. The date shown is not the original due date.    Advanced directives: not yet completed  Conditions/risks identified: none new  Next appointment: Follow up in one year for your annual wellness visit.   Preventive Care 77 Years and Older, Male  Preventive care refers to lifestyle choices and visits with your health care provider that can promote health and wellness. What does preventive care include? A yearly physical exam. This is also called an annual well check. Dental exams once or twice a year. Routine eye exams. Ask your health care provider how often you should have your eyes checked. Personal lifestyle choices, including: Daily care of your teeth and gums. Regular physical activity. Eating a healthy diet. Avoiding tobacco and drug use. Limiting alcohol use. Practicing safe sex. Taking low doses of aspirin every day. Taking vitamin and mineral supplements as recommended by your health care provider. What happens during an annual  well check? The services and screenings done by your health care provider during your annual well check will depend on your age, overall health, lifestyle risk factors, and family history of disease. Counseling  Your health care provider may ask you questions about your: Alcohol use. Tobacco use. Drug use. Emotional well-being. Home and relationship well-being. Sexual activity. Eating habits. History of falls. Memory and ability to understand (cognition). Work and work Statistician. Screening  You may have the following tests or measurements: Height, weight, and BMI. Blood pressure. Lipid and cholesterol levels. These may be checked every 5 years, or more frequently if you are over 54 years old. Skin check. Lung cancer screening. You may have this screening every year starting at age 77 if you have a 30-pack-year history of smoking and currently smoke or have quit within the past 15 years. Fecal occult blood test (FOBT) of the stool. You may have this test every year starting at age 77. Flexible sigmoidoscopy or colonoscopy. You may have a sigmoidoscopy every 5 years or a colonoscopy every 10 years starting at age 77. Prostate cancer screening. Recommendations will vary depending on your family history and other risks. Hepatitis C blood test. Hepatitis B blood test. Sexually transmitted disease (STD) testing. Diabetes screening. This is done by checking your blood sugar (glucose) after you have not eaten for a while (fasting). You may have this done every 1-3 years.  Abdominal aortic aneurysm (AAA) screening. You may need this if you are a current or former smoker. Osteoporosis. You may be screened starting at age 77 if you are at high risk. Talk with your health care provider about your test results, treatment options, and if necessary, the need for more tests. Vaccines  Your health care provider may recommend certain vaccines, such as: Influenza vaccine. This is recommended every  year. Tetanus, diphtheria, and acellular pertussis (Tdap, Td) vaccine. You may need a Td booster every 10 years. Zoster vaccine. You may need this after age 77. Pneumococcal 13-valent conjugate (PCV13) vaccine. One dose is recommended after age 77. Pneumococcal polysaccharide (PPSV23) vaccine. One dose is recommended after age 77. Talk to your health care provider about which screenings and vaccines you need and how often you need them. This information is not intended to replace advice given to you by your health care provider. Make sure you discuss any questions you have with your health care provider. Document Released: 10/14/2015 Document Revised: 06/06/2016 Document Reviewed: 07/19/2015 Elsevier Interactive Patient Education  2017 [employer] Prevention in the Home Falls can cause injuries. They can happen to people of all ages. There are many things you can do to make your home safe and to help prevent falls. What can I do on the outside of my home? Regularly fix the edges of walkways and driveways and fix any cracks. Remove anything that might make you trip as you walk through a door, such as a raised step or threshold. Trim any bushes or trees on the path to your home. Use bright outdoor lighting. Clear any walking paths of anything that might make someone trip, such as rocks or tools. Regularly check to see if handrails are loose or broken. Make sure that both sides of any steps have handrails. Any raised decks and porches should have guardrails on the edges. Have any leaves, snow, or ice cleared regularly. Use sand or salt on walking paths during winter. Clean up any spills in your garage right away. This includes oil or grease spills. What can I do in the bathroom? Use night lights. Install grab bars by the toilet and in the tub and shower. Do not use towel bars as grab bars. Use non-skid mats or decals in the tub or shower. If you need to sit down in the shower, use a  plastic, non-slip stool. Keep the floor dry. Clean up any water that spills on the floor as soon as it happens. Remove soap buildup in the tub or shower regularly. Attach bath mats securely with double-sided non-slip rug tape. Do not have throw rugs and other things on the floor that can make you trip. What can I do in the bedroom? Use night lights. Make sure that you have a light by your bed that is easy to reach. Do not use any sheets or blankets that are too big for your bed. They should not hang down onto the floor. Have a firm chair that has side arms. You can use this for support while you get dressed. Do not have throw rugs and other things on the floor that can make you trip. What can I do in the kitchen? Clean up any spills right away. Avoid walking on wet floors. Keep items that you use a lot in easy-to-reach places. If you need to reach something above you, use a strong step stool that has a grab bar. Keep electrical cords out of the way. Do not  use floor polish or wax that makes floors slippery. If you must use wax, use non-skid floor wax. Do not have throw rugs and other things on the floor that can make you trip. What can I do with my stairs? Do not leave any items on the stairs. Make sure that there are handrails on both sides of the stairs and use them. Fix handrails that are broken or loose. Make sure that handrails are as long as the stairways. Check any carpeting to make sure that it is firmly attached to the stairs. Fix any carpet that is loose or worn. Avoid having throw rugs at the top or bottom of the stairs. If you do have throw rugs, attach them to the floor with carpet tape. Make sure that you have a light switch at the top of the stairs and the bottom of the stairs. If you do not have them, ask someone to add them for you. What else can I do to help prevent falls? Wear shoes that: Do not have high heels. Have rubber bottoms. Are comfortable and fit you  well. Are closed at the toe. Do not wear sandals. If you use a stepladder: Make sure that it is fully opened. Do not climb a closed stepladder. Make sure that both sides of the stepladder are locked into place. Ask someone to hold it for you, if possible. Clearly mark and make sure that you can see: Any grab bars or handrails. First and last steps. Where the edge of each step is. Use tools that help you move around (mobility aids) if they are needed. These include: Canes. Walkers. Scooters. Crutches. Turn on the lights when you go into a dark area. Replace any light bulbs as soon as they burn out. Set up your furniture so you have a clear path. Avoid moving your furniture around. If any of your floors are uneven, fix them. If there are any pets around you, be aware of where they are. Review your medicines with your doctor. Some medicines can make you feel dizzy. This can increase your chance of falling. Ask your doctor what other things that you can do to help prevent falls. This information is not intended to replace advice given to you by your health care provider. Make sure you discuss any questions you have with your health care provider. Document Released: 07/14/2009 Document Revised: 02/23/2016 Document Reviewed: 10/22/2014 Elsevier Interactive Patient Education  2017 [employer].

## 2022-10-29 NOTE — Progress Notes (Addendum)
Subjective:   Michael Chang is a 77 y.o. male who presents for Medicare Annual/Subsequent preventive examination.  Review of Systems    No ROS.  Medicare Wellness Virtual Visit.  Visual/audio telehealth visit, UTA vital signs.   See social history for additional risk factors.   Cardiac Risk Factors include: advanced age (>30men, >56 women);male gender;hypertension     Objective:    Today's Vitals   10/29/22 0901  Weight: 154 lb (69.9 kg)  Height: 6\' 1"  (1.854 m)   Body mass index is 20.32 kg/m.     10/29/2022    8:48 AM 10/26/2021    9:49 AM 02/14/2020    2:51 AM  Advanced Directives  Does Patient Have a Medical Advance Directive? No No No  Would patient like information on creating a medical advance directive? No - Patient declined Yes (MAU/Ambulatory/Procedural Areas - Information given) No - Patient declined    Current Medications (verified) Outpatient Encounter Medications as of 10/29/2022  Medication Sig   amLODipine (NORVASC) 10 MG tablet TAKE 1 TABLET BY MOUTH DAILY   aspirin 81 MG EC tablet Take by mouth.   carvedilol (COREG) 25 MG tablet TAKE ONE-HALF TABLET BY MOUTH IN THE MORNING AND 1 TABLET BY  MOUTH IN THE EVENING   ferrous sulfate 325 (65 FE) MG tablet Take 325 mg by mouth daily with breakfast.   hydrALAZINE (APRESOLINE) 100 MG tablet TAKE 1 TABLET BY MOUTH  TWICE DAILY   isosorbide mononitrate (IMDUR) 30 MG 24 hr tablet TAKE 2 TABLETS BY MOUTH IN  THE MORNING AND TAKE  ONE-HALF TABLET BY MOUTH IN THE EVENING   Multiple Vitamins-Minerals (CENTRUM SILVER 50+MEN) TABS Take by mouth.   No facility-administered encounter medications on file as of 10/29/2022.    Allergies (verified) Patient has no known allergies.   History: Past Medical History:  Diagnosis Date   Anemia    CAD (coronary artery disease)    Chronic combined systolic (congestive) and diastolic (congestive) heart failure (HCC)    Chronic ulcer of right lower extremity (HCC)    CKD  (chronic kidney disease), stage III (HCC)    Essential hypertension    HLD (hyperlipidemia)    NSTEMI (non-ST elevated myocardial infarction) ([place])    PVD (peripheral vascular disease) (Uncertain)    History reviewed. No pertinent surgical history. History reviewed. No pertinent family history. Social History   Socioeconomic History   Marital status: Single    Spouse name: Not on file   Number of children: Not on file   Years of education: Not on file   Highest education level: Not on file  Occupational History   Not on file  Tobacco Use   Smoking status: Never   Smokeless tobacco: Never  Substance and Sexual Activity   Alcohol use: Not Currently   Drug use: Not on file   Sexual activity: Not on file  Other Topics Concern   Not on file  Social History Narrative   Not on file   Social Determinants of Health   Financial Resource Strain: Low Risk  (10/29/2022)   Overall Financial Resource Strain (CARDIA)    Difficulty of Paying Living Expenses: Not hard at all  Food Insecurity: No Food Insecurity (10/29/2022)   Hunger Vital Sign    Worried About Running Out of Food in the Last Year: Never true    Ran Out of Food in the Last Year: Never true  Transportation Needs: No Transportation Needs (10/29/2022)   [place] - Transportation  Lack of Transportation (Medical): No    Lack of Transportation (Non-Medical): No  Physical Activity: Insufficiently Active (10/29/2022)   Exercise Vital Sign    Days of Exercise per Week: 4 days    Minutes of Exercise per Session: 20 min  Stress: No Stress Concern Present (10/29/2022)   [place]    Feeling of Stress : Not at all  Social Connections: Socially Isolated (10/29/2022)   Social Connection and Isolation Panel [NHANES]    Frequency of Communication with Friends and Family: Twice a week    Frequency of Social Gatherings with Friends and Family: Twice a week    Attends Religious  Services: Never    Marine scientist or Organizations: No    Attends Music therapist: Never    Marital Status: Never married    Tobacco Counseling Counseling given: Not Answered   Clinical Intake:  Pre-visit preparation completed: Yes        Diabetes: No  How often do you need to have someone help you when you read instructions, pamphlets, or other written materials from your doctor or pharmacy?: 1 - Never    Interpreter Needed?: No      Activities of Daily Living    10/29/2022    8:49 AM  In your present state of health, do you have any difficulty performing the following activities:  Hearing? 0  Vision? 0  Difficulty concentrating or making decisions? 0  Walking or climbing stairs? 0  Dressing or bathing? 0  Doing errands, shopping? 1  Comment [place] and eating ? N  Using the Toilet? N  In the past six months, have you accidently leaked urine? N  Do you have problems with loss of bowel control? N  Managing your Medications? N  Managing your Finances? N  Housekeeping or managing your Housekeeping? N    Patient Care Team: [name], NP as PCP - General (Internal Medicine) Wellington Hampshire, MD as PCP - Cardiology (Cardiology) Charlton Haws, [employer] as Pharmacist (Pharmacist)  Indicate any recent Medical Services you may have received from other than [facility] providers in the past year (date may be approximate).     Assessment:   This is a routine wellness examination for Michael Chang.  I connected with  Redmond Whittley on 10/29/22 by a audio enabled telemedicine application and verified that I am speaking with the correct person using two identifiers.  Patient Location: Home  Provider Location: Office/Clinic  I discussed the limitations of evaluation and management by telemedicine. The patient expressed understanding and agreed to proceed.   Hearing/Vision screen Hearing Screening - Comments:: Patient is  able to hear conversational tones without difficulty.  No issues reported.   Vision Screening - Comments:: Last exam a year ago, wears glasses, Dr. Tommy Rainwater  Dietary issues and exercise activities discussed: Current Exercise Habits: Home exercise routine, Type of exercise: walking, Intensity: Mild   Goals Addressed               This Visit's Progress     Patient Stated     Maintain healthy lifestyle (pt-stated)        Stay active Healthy diet Stay hydrated       Depression Screen    10/29/2022    8:52 AM 02/22/2022   11:23 AM 10/26/2021    9:51 AM 02/10/2021    8:28 AM 02/17/2020   11:28 AM  PHQ 2/9 Scores  PHQ - 2 Score 0 0 0 0 0  PHQ- 9 Score  0  0 0    Fall Risk    10/29/2022    8:49 AM 10/26/2021    9:50 AM 02/10/2021    8:28 AM 02/17/2020   11:28 AM  Fall Risk   Falls in the past year? 0 0 0 1  Comment    passed out  Number falls in past yr: 0 0 0 1  Injury with Fall?  0 0 1  Comment    head injury  Risk for fall due to :    Impaired balance/gait;History of fall(s);Medication side effect;Impaired vision  Follow up Falls evaluation completed;Falls prevention discussed Falls prevention discussed  Falls evaluation completed;Falls prevention discussed    FALL RISK PREVENTION PERTAINING TO THE HOME: Home free of loose throw rugs in walkways, pet beds, electrical cords, etc? Yes  Adequate lighting in your home to reduce risk of falls? Yes   ASSISTIVE DEVICES UTILIZED TO PREVENT FALLS: Life alert? No  Use of a cane, walker or w/c? Cane as needed Grab bars in the bathroom? Yes  Shower chair or bench in shower? Yes  Elevated toilet seat or a handicapped toilet? Yes   TIMED UP AND GO: Was the test performed? No .   Cognitive Function:    02/17/2020   11:31 AM  MMSE - Mini Mental State Exam  Not completed: Unable to complete        10/29/2022    8:57 AM  6CIT Screen  What Year? 0 points  What month? 0 points  What time? 0 points  Count back from 20 0  points  Months in reverse 0 points  Repeat phrase 0 points  Total Score 0 points    Immunizations  There is no immunization history on file for this patient.  TDAP status: Due, Education has been provided regarding the importance of this vaccine. Advised may receive this vaccine at local pharmacy or Health Dept. Aware to provide a copy of the vaccination record if obtained from local pharmacy or Health Dept. Verbalized acceptance and understanding.  Flu Vaccine status: Due, Education has been provided regarding the importance of this vaccine. Advised may receive this vaccine at local pharmacy or Health Dept. Aware to provide a copy of the vaccination record if obtained from local pharmacy or Health Dept. Verbalized acceptance and understanding.  Screening Tests Health Maintenance  Topic Date Due   DTaP/Tdap/Td (1 - Tdap) Never done   INFLUENZA VACCINE  12/30/2022 (Originally 05/01/2022)   Medicare Annual Wellness (AWV)  10/30/2023   Hepatitis C Screening  Completed   HPV VACCINES  Aged Out   Pneumonia Vaccine 67+ Years old  Discontinued   COLONOSCOPY (Pts 45-68yrs Insurance coverage will need to be confirmed)  Discontinued   COVID-19 Vaccine  Discontinued   Zoster Vaccines- Shingrix  Discontinued    Health Maintenance Health Maintenance Due  Topic Date Due   DTaP/Tdap/Td (1 - Tdap) Never done   Lung Cancer Screening: (Low Dose CT Chest recommended if Age 67-80 years, 30 pack-year currently smoking OR have quit w/in 15years.) does not qualify.   Hepatitis C Screening: Completed 03/2020.  Vision Screening: Recommended annual ophthalmology exams for early detection of glaucoma and other disorders of the eye.  Dental Screening: Recommended annual dental exams for proper oral hygiene  Community Resource Referral / Chronic Care Management: CRR required this visit?  No   CCM required this visit?  No  Plan:     I have personally reviewed and noted the following in the  patient's chart:   Medical and social history Use of alcohol, tobacco or illicit drugs  Current medications and supplements including opioid prescriptions. Patient is not currently taking opioid prescriptions. Functional ability and status Nutritional status Physical activity Advanced directives List of other physicians Hospitalizations, surgeries, and ER visits in previous 12 months Vitals Screenings to include cognitive, depression, and falls Referrals and appointments  In addition, I have reviewed and discussed with patient certain preventive protocols, quality metrics, and best practice recommendations. A written personalized care plan for preventive services as well as general preventive health recommendations were provided to patient.     Leta Jungling, LPN   [id number]

## 2023-01-16 ENCOUNTER — Other Ambulatory Visit: Payer: Self-pay | Admitting: Cardiovascular Disease

## 2023-01-17 ENCOUNTER — Other Ambulatory Visit: Payer: Self-pay | Admitting: Cardiovascular Disease

## 2023-01-17 NOTE — Telephone Encounter (Signed)
Please contact pt for future appointment. °Pt overdue for 9 month fu. ° °

## 2023-01-18 ENCOUNTER — Telehealth: Payer: Self-pay | Admitting: Cardiovascular Disease

## 2023-01-18 MED ORDER — HYDRALAZINE HCL 100 MG PO TABS: 100.0000 mg | ORAL_TABLET | Freq: Two times a day (BID) | ORAL | 3 refills | Status: DC

## 2023-01-18 NOTE — Telephone Encounter (Signed)
Pt c/o medication issue:  1. Name of Medication:   hydrALAZINE (APRESOLINE) 100 MG tablet    2. How are you currently taking this medication (dosage and times per day)? Still taking currently   3. Are you having a reaction (difficulty breathing--STAT)?   4. What is your medication issue?  Patient was told this medication had stop put on it and he would like a call back to discuss this.

## 2023-01-18 NOTE — Telephone Encounter (Signed)
Patient states received a text from pharmacy [facility] that  there was a message to stop or change the medication. He is unable to see the message so not sure what it states exactly.  Do not see any changes or issues on our end.  Will send a refill to see if this was an issue or to see if there is a problem that a refill might generate information.  Advised patient of this information and he is good with this.

## 2023-02-01 NOTE — Telephone Encounter (Signed)
Patient is scheduled for 5/21 at [facility]

## 2023-02-02 ENCOUNTER — Other Ambulatory Visit: Payer: Self-pay | Admitting: Cardiovascular Disease

## 2023-02-18 NOTE — Progress Notes (Unsigned)
Cardiology Office Note   Date:  02/19/2023   ID:  Michael Chang, DOB 1945/10/06, MRN [id number]  PCP:  Doreene Nest, NP  Cardiologist:   Lorine Bears, MD   No chief complaint on file.     History of Present Illness: Michael Chang is a 77 y.o. male who is here today for a follow-up visit regarding hypertensive heart disease and possible underlying coronary artery disease.   He is nearly blind and moved to [place] in 2021 to be closer to his sister.  He also has history of peripheral arterial disease with lower extremity ulceration but ABI in March was normal. The patient did not seek medical attention for many years at least since 2007 and was living in a poor living condition by himself. He was diagnosed with heart failure in [place] and March 2021 with an EF of 40 to 45%.  He did have non-ST elevation myocardial infarction but was treated medically.  He does have chronic kidney disease with creatinine of 2.77.  His initial presentation was with a possible syncopal episode.  He was noted to be anemic and underwent an EGD which showed moderately erythematous stomach mucosa.  Colonoscopy showed internal hemorrhoids.  It was not clear if he had acute kidney injury or whether he had chronic kidney disease. He is not a smoker and quit alcohol 13 years ago.  He is not diabetic.  He is retired and used to work in [employer].   Repeat echo in 2021 showed an EF of 55 to 60% with severe LVH.   He has been doing well overall with no recent chest pain, shortness of breath or palpitations.  No lower extremity claudication or ulceration.   Past Medical History:  Diagnosis Date   Anemia    CAD (coronary artery disease)    Chronic combined systolic (congestive) and diastolic (congestive) heart failure (HCC)    Chronic ulcer of right lower extremity (HCC)    CKD (chronic kidney disease), stage III (HCC)    Essential hypertension    HLD (hyperlipidemia)    NSTEMI (non-ST elevated  myocardial infarction) (HCC)    PVD (peripheral vascular disease) (HCC)     No past surgical history on file.   Current Outpatient Medications  Medication Sig Dispense Refill   amLODipine (NORVASC) 10 MG tablet TAKE 1 TABLET BY MOUTH DAILY 100 tablet 3   aspirin 81 MG EC tablet Take by mouth.     ferrous sulfate 325 (65 FE) MG tablet Take 325 mg by mouth daily with breakfast.     hydrALAZINE (APRESOLINE) 100 MG tablet Take 1 tablet (100 mg total) by mouth 2 (two) times daily. 90 tablet 3   isosorbide mononitrate (IMDUR) 30 MG 24 hr tablet TAKE 2 TABLETS BY MOUTH IN THE  MORNING AND TAKE ONE-HALF TABLET BY MOUTH IN THE EVENING 75 tablet 0   Multiple Vitamins-Minerals (CENTRUM SILVER 50+MEN) TABS Take by mouth.     carvedilol (COREG) 25 MG tablet TAKE ONE-HALF TABLET BY MOUTH IN THE MORNING AND 1 TABLET BY  MOUTH IN THE EVENING 135 tablet 3   No current facility-administered medications for this visit.    Allergies:   Patient has no known allergies.    Social History:  The patient  reports that he has never smoked. He has never used smokeless tobacco. He reports that he does not currently use alcohol.   Family History:  The patient's family history is negative for coronary artery disease.  There is family history of hypertension.   ROS:  Please see the history of present illness.   Otherwise, review of systems are positive for none.   All other systems are reviewed and negative.    PHYSICAL EXAM: VS:  BP 122/78   Pulse 66   Ht 6\' 1"  (1.854 m)   Wt 163 lb 3.2 oz (74 kg)   SpO2 96%   BMI 21.53 kg/m  , BMI Body mass index is 21.53 kg/m. GEN: Well nourished, well developed, in no acute distress  HEENT: normal  Neck: no JVD, carotid bruits, or masses Cardiac: RRR; no murmurs, rubs, or gallops,no edema  Respiratory:  clear to auscultation bilaterally, normal work of breathing GI: soft, nontender, nondistended, + BS MS: no deformity or atrophy  Skin: warm and dry, no  rash Neuro:  Strength and sensation are intact Psych: euthymic mood, full affect   EKG:  EKG is ordered today. The ekg ordered today demonstrates sinus rhythm with PACs and possible old septal infarct.   Recent Labs: 02/22/2022: ALT 19; BUN 23; Creatinine, Ser 1.85; Hemoglobin 12.5; Platelets 243.0; Potassium 4.0; Sodium 143    Lipid Panel    Component Value Date/Time   CHOL 159 02/22/2022 1212   TRIG 98.0 02/22/2022 1212   HDL 43.00 02/22/2022 1212   CHOLHDL 4 02/22/2022 1212   VLDL 19.6 02/22/2022 1212   LDLCALC 96 02/22/2022 1212      Wt Readings from Last 3 Encounters:  02/19/23 163 lb 3.2 oz (74 kg)  10/29/22 154 lb (69.9 kg)  02/22/22 154 lb (69.9 kg)           No data to display            ASSESSMENT AND PLAN:  1.  Chronic diastolic heart failure with hypertensive heart disease: Most recent echocardiogram showed normal LV systolic function.  He appears to be euvolemic without any diuretics.  Continue blood pressure control.   2.  Essential hypertension: His blood pressure is well-controlled on current medications.   Continue amlodipine, carvedilol, hydralazine and Imdur.  He has advanced chronic kidney disease and thus no ACE inhibitor or ARB. We refilled all his cardiac medications.  3.   Chronic kidney disease: Gradual improvement in renal function with most recent creatinine of 1.85.    Disposition:   FU with me in 12 months  Signed,  Lorine Bears, MD  02/19/2023 9:59 AM    [facility]

## 2023-02-19 ENCOUNTER — Ambulatory Visit: Payer: Medicare Other | Attending: Cardiovascular Disease | Admitting: Cardiovascular Disease

## 2023-02-19 VITALS — BP 122/78 | HR 66 | Ht 73.0 in | Wt 163.2 lb

## 2023-02-19 DIAGNOSIS — I5032 Chronic diastolic (congestive) heart failure: Secondary | ICD-10-CM | POA: Diagnosis not present

## 2023-02-19 DIAGNOSIS — N184 Chronic kidney disease, stage 4 (severe): Secondary | ICD-10-CM | POA: Diagnosis not present

## 2023-02-19 DIAGNOSIS — I10 Essential (primary) hypertension: Secondary | ICD-10-CM

## 2023-02-19 MED ORDER — CARVEDILOL 25 MG PO TABS: ORAL_TABLET | ORAL | 3 refills | Status: DC

## 2023-02-19 NOTE — Patient Instructions (Signed)
Medication Instructions:  No changes *If you need a refill on your cardiac medications before your next appointment, please call your pharmacy*   Lab Work: None ordered If you have labs (blood work) drawn today and your tests are completely normal, you will receive your results only by: MyChart Message (if you have MyChart) OR A paper copy in the mail If you have any lab test that is abnormal or we need to change your treatment, we will call you to review the results.   Testing/Procedures: None ordered   Follow-Up: At [facility], you and your health needs are our priority.  As part of our continuing mission to provide you with exceptional heart care, we have created designated Provider Care Teams.  These Care Teams include your primary Cardiologist (physician) and Advanced Practice Providers (APPs -  Physician Assistants and Nurse Practitioners) who all work together to provide you with the care you need, when you need it.  We recommend signing up for the patient portal called "MyChart".  Sign up information is provided on this After Visit Summary.  MyChart is used to connect with patients for Virtual Visits (Telemedicine).  Patients are able to view lab/test results, encounter notes, upcoming appointments, etc.  Non-urgent messages can be sent to your provider as well.   To learn more about what you can do with MyChart, go to [email or web address].    Your next appointment:   12 month(s)  Provider:   Muhammad Arida, MD       

## 2023-02-28 ENCOUNTER — Ambulatory Visit (INDEPENDENT_AMBULATORY_CARE_PROVIDER_SITE_OTHER): Payer: Medicare Other | Admitting: Primary Care

## 2023-02-28 ENCOUNTER — Encounter: Payer: Self-pay | Admitting: Primary Care

## 2023-02-28 VITALS — BP 132/74 | HR 64 | Temp 97.9°F | Ht 73.0 in | Wt 163.0 lb

## 2023-02-28 DIAGNOSIS — Z125 Encounter for screening for malignant neoplasm of prostate: Secondary | ICD-10-CM | POA: Diagnosis not present

## 2023-02-28 DIAGNOSIS — L97512 Non-pressure chronic ulcer of other part of right foot with fat layer exposed: Secondary | ICD-10-CM | POA: Diagnosis not present

## 2023-02-28 DIAGNOSIS — I2510 Atherosclerotic heart disease of native coronary artery without angina pectoris: Secondary | ICD-10-CM | POA: Diagnosis not present

## 2023-02-28 DIAGNOSIS — Z0000 Encounter for general adult medical examination without abnormal findings: Secondary | ICD-10-CM

## 2023-02-28 DIAGNOSIS — D509 Iron deficiency anemia, unspecified: Secondary | ICD-10-CM

## 2023-02-28 DIAGNOSIS — I739 Peripheral vascular disease, unspecified: Secondary | ICD-10-CM | POA: Diagnosis not present

## 2023-02-28 DIAGNOSIS — R972 Elevated prostate specific antigen [PSA]: Secondary | ICD-10-CM

## 2023-02-28 DIAGNOSIS — I10 Essential (primary) hypertension: Secondary | ICD-10-CM

## 2023-02-28 DIAGNOSIS — I5022 Chronic systolic (congestive) heart failure: Secondary | ICD-10-CM

## 2023-02-28 DIAGNOSIS — N183 Chronic kidney disease, stage 3 unspecified: Secondary | ICD-10-CM

## 2023-02-28 LAB — LIPID PANEL
Cholesterol: 152 mg/dL (ref 0–200)
HDL: 41.9 mg/dL (ref >39.00)
LDL Cholesterol: 92 mg/dL (ref 0–99)
NonHDL: 109.73
Total CHOL/HDL Ratio: 4
Triglycerides: 90 mg/dL (ref 0.0–149.0)
VLDL: 18 mg/dL (ref 0.0–40.0)

## 2023-02-28 LAB — HEMOGLOBIN A1C: Hgb A1c MFr Bld: 5.5 % (ref 4.6–6.5)

## 2023-02-28 LAB — PSA, MEDICARE: PSA: 5.1 ng/ml — ABNORMAL HIGH (ref 0.10–4.00)

## 2023-02-28 LAB — CBC
HCT: 39.4 % (ref 39.0–52.0)
Hemoglobin: 12.9 g/dL — ABNORMAL LOW (ref 13.0–17.0)
MCHC: 32.6 g/dL (ref 30.0–36.0)
MCV: 92.9 fl (ref 78.0–100.0)
Platelets: 253 10*3/uL (ref 150.0–400.0)
RBC: 4.24 Mil/uL (ref 4.22–5.81)
RDW: 14.1 % (ref 11.5–15.5)
WBC: 4.6 10*3/uL (ref 4.0–10.5)

## 2023-02-28 LAB — COMPREHENSIVE METABOLIC PANEL
ALT: 17 U/L (ref 0–53)
AST: 24 U/L (ref 0–37)
Albumin: 3.7 g/dL (ref 3.5–5.2)
Alkaline Phosphatase: 55 U/L (ref 39–117)
BUN: 21 mg/dL (ref 6–23)
CO2: 26 mEq/L (ref 19–32)
Calcium: 9.1 mg/dL (ref 8.4–10.5)
Chloride: 105 mEq/L (ref 96–112)
Creatinine, Ser: 2.03 mg/dL — ABNORMAL HIGH (ref 0.40–1.50)
GFR: 31.17 mL/min — ABNORMAL LOW (ref >60.00)
Glucose, Bld: 95 mg/dL (ref 70–99)
Potassium: 4.1 mEq/L (ref 3.5–5.1)
Sodium: 142 mEq/L (ref 135–145)
Total Bilirubin: 0.5 mg/dL (ref 0.2–1.2)
Total Protein: 8.1 g/dL (ref 6.0–8.3)

## 2023-02-28 LAB — IBC + FERRITIN
Ferritin: 172.6 ng/mL (ref 22.0–322.0)
Iron: 64 ug/dL (ref 42–165)
Saturation Ratios: 28.2 % (ref 20.0–50.0)
TIBC: 226.8 ug/dL — ABNORMAL LOW (ref 250.0–450.0)
Transferrin: 162 mg/dL — ABNORMAL LOW (ref 212.0–360.0)

## 2023-02-28 NOTE — Assessment & Plan Note (Signed)
Patient cancelled Urology appointment despite recommendations for urology evaluation. Discussed this today.   He declines Urology visit. Repeat PSA pending today.  Asymptomatic for LUTS.

## 2023-02-28 NOTE — Progress Notes (Signed)
Subjective:    Patient ID: Michael Chang, male    DOB: 09-07-1946, 77 y.o.   MRN: [id number]  HPI  Michael Chang is a very pleasant 77 y.o. male who presents today for complete physical and follow up of chronic conditions.  Immunizations: -Shingles: Never completed, not interested -Pneumonia: Never completed, declines  Diet: Fair diet.  Exercise: No regular exercise.  Eye exam: Completes annually  Dental exam: Completed years ago    Colonoscopy: Declines   PSA: Due  BP Readings from Last 3 Encounters:  02/28/23 132/74  02/19/23 122/78  02/22/22 140/68     Review of Systems  Constitutional:  Negative for unexpected weight change.  HENT:  Negative for rhinorrhea.   Respiratory:  Negative for cough and shortness of breath.   Cardiovascular:  Negative for chest pain.  Gastrointestinal:  Negative for constipation and diarrhea.  Genitourinary:  Negative for difficulty urinating and frequency.  Musculoskeletal:  Negative for arthralgias and myalgias.  Skin:  Negative for rash.  Allergic/Immunologic: Negative for environmental allergies.  Neurological:  Negative for dizziness and headaches.  Psychiatric/Behavioral:  The patient is not nervous/anxious.          Past Medical History:  Diagnosis Date   Anemia    Anemia in chronic kidney disease 03/02/2020   CAD (coronary artery disease)    Cataract (lens) fragments in eye following cataract surgery, right eye 03/15/2020   Chronic combined systolic (congestive) and diastolic (congestive) heart failure (HCC)    Chronic ulcer of right lower extremity (HCC)    CKD (chronic kidney disease), stage III (HCC)    Essential hypertension    HLD (hyperlipidemia)    Near syncope 02/14/2020   NSTEMI (non-ST elevated myocardial infarction) (HCC)    PVD (peripheral vascular disease) (HCC)     Social History   Socioeconomic History   Marital status: Single    Spouse name: Not on file   Number of children: Not on file    Years of education: Not on file   Highest education level: Not on file  Occupational History   Not on file  Tobacco Use   Smoking status: Never   Smokeless tobacco: Never  Substance and Sexual Activity   Alcohol use: Not Currently   Drug use: Not on file   Sexual activity: Not on file  Other Topics Concern   Not on file  Social History Narrative   Not on file   Social Determinants of Health   Financial Resource Strain: Low Risk  (10/29/2022)   Overall Financial Resource Strain (CARDIA)    Difficulty of Paying Living Expenses: Not hard at all  Food Insecurity: No Food Insecurity (10/29/2022)   Hunger Vital Sign    Worried About Running Out of Food in the Last Year: Never true    Ran Out of Food in the Last Year: Never true  Transportation Needs: No Transportation Needs (10/29/2022)   PRAPARE - Administrator, Civil Service (Medical): No    Lack of Transportation (Non-Medical): No  Physical Activity: Insufficiently Active (10/29/2022)   Exercise Vital Sign    Days of Exercise per Week: 4 days    Minutes of Exercise per Session: 20 min  Stress: No Stress Concern Present (10/29/2022)   [employer] of Occupational Health - Occupational Stress Questionnaire    Feeling of Stress : Not at all  Social Connections: Socially Isolated (10/29/2022)   Social Connection and Isolation Panel [NHANES]    Frequency of Communication with Friends  and Family: Twice a week    Frequency of Social Gatherings with Friends and Family: Twice a week    Attends Religious Services: Never    Database administrator or Organizations: No    Attends Banker Meetings: Never    Marital Status: Never married  Intimate Partner Violence: Not At Risk (10/29/2022)   Humiliation, Afraid, Rape, and Kick questionnaire    Fear of Current or Ex-Partner: No    Emotionally Abused: No    Physically Abused: No    Sexually Abused: No    History reviewed. No pertinent surgical  history.  History reviewed. No pertinent family history.  No Known Allergies  Current Outpatient Medications on File Prior to Visit  Medication Sig Dispense Refill   amLODipine (NORVASC) 10 MG tablet TAKE 1 TABLET BY MOUTH DAILY 100 tablet 3   aspirin 81 MG EC tablet Take by mouth.     carvedilol (COREG) 25 MG tablet TAKE ONE-HALF TABLET BY MOUTH IN THE MORNING AND 1 TABLET BY  MOUTH IN THE EVENING 135 tablet 3   ferrous sulfate 325 (65 FE) MG tablet Take 325 mg by mouth daily with breakfast.     hydrALAZINE (APRESOLINE) 100 MG tablet Take 1 tablet (100 mg total) by mouth 2 (two) times daily. 90 tablet 3   isosorbide mononitrate (IMDUR) 30 MG 24 hr tablet TAKE 2 TABLETS BY MOUTH IN THE  MORNING AND TAKE ONE-HALF TABLET BY MOUTH IN THE EVENING 75 tablet 0   Multiple Vitamins-Minerals (CENTRUM SILVER 50+MEN) TABS Take by mouth.     No current facility-administered medications on file prior to visit.    BP 132/74   Pulse 64   Temp 97.9 F (36.6 C) (Temporal)   Ht 6\' 1"  (1.854 m)   Wt 163 lb (73.9 kg)   SpO2 98%   BMI 21.51 kg/m  Objective:   Physical Exam HENT:     Right Ear: Tympanic membrane and ear canal normal.     Left Ear: Tympanic membrane and ear canal normal.     Nose: Nose normal.     Right Sinus: No maxillary sinus tenderness or frontal sinus tenderness.     Left Sinus: No maxillary sinus tenderness or frontal sinus tenderness.  Eyes:     Conjunctiva/sclera: Conjunctivae normal.  Neck:     Thyroid: No thyromegaly.     Vascular: No carotid bruit.  Cardiovascular:     Rate and Rhythm: Normal rate and regular rhythm.     Heart sounds: Normal heart sounds.  Pulmonary:     Effort: Pulmonary effort is normal.     Breath sounds: Normal breath sounds. No wheezing or rales.  Abdominal:     General: Bowel sounds are normal.     Palpations: Abdomen is soft.     Tenderness: There is no abdominal tenderness.  Musculoskeletal:        General: Normal range of motion.      Cervical back: Neck supple.  Skin:    General: Skin is warm.     Comments: 3.5 cm X 2.5 cm rounded ulcer to right medial ankle with fat layer exposed. Moist.   Neurological:     Mental Status: He is alert and oriented to person, place, and time.     Cranial Nerves: No cranial nerve deficit.     Deep Tendon Reflexes: Reflexes are normal and symmetric.  Psychiatric:        Mood and Affect: Mood normal.  Assessment & Plan:  Preventative health care Assessment & Plan: Declines pneumonia vaccine. Discussed Shingrix. Colonoscopy overdue, he declines given age.  PSA due and pending.  Discussed the importance of a healthy diet and regular exercise in order for weight loss, and to reduce the risk of further co-morbidity.  Exam stable. Labs pending.  Follow up in 1 year for repeat physical.    Coronary artery disease involving native heart without angina pectoris, unspecified vessel or lesion type Assessment & Plan: Asymptomatic.  Following with cardiology.   Continue carvedilol 12.5 mg in AM and 25 mg in PM, hydralazine 100 mg BID, Imdur 60 mg in AM and 15 mg in PM.    Chronic systolic congestive heart failure (HCC) Assessment & Plan: Appears euvolemic today.  Continue carvedilol 12.5 mg in AM and 25 mg in PM.   PAD (peripheral artery disease) (HCC) Assessment & Plan: No claudication. Chronic ulcer to right lower extremity.  Following with cardiology. Continue to monitor. Continue aspirin 81 mg.   He declines wound care referral  Orders: -     Lipid panel  Stage 3 chronic kidney disease, unspecified whether stage 3a or 3b CKD (HCC) Assessment & Plan: Repeat renal function pending. No ACE/ARB given advanced renal disease.  Avoid NSAIDs   Ulcer of right foot with fat layer exposed (HCC) Assessment & Plan: Chronic, still present on exam. No complications. Recommended wound care referral for which he declines.  Continue to monitor.   Iron  deficiency anemia, unspecified iron deficiency anemia type Assessment & Plan: Continue ferrous sulfate 325 mg daily. Repeat CBC and iron studies pending.  Orders: -     CBC -     IBC + Ferritin  Essential hypertension -     Comprehensive metabolic panel -     Lipid panel -     Hemoglobin A1c  Screening for prostate cancer -     PSA, Medicare        Doreene Nest, NP

## 2023-02-28 NOTE — Assessment & Plan Note (Signed)
Continue ferrous sulfate 325 mg daily. Repeat CBC and iron studies pending.

## 2023-02-28 NOTE — Assessment & Plan Note (Signed)
Appears euvolemic today.  Continue carvedilol 12.5 mg in AM and 25 mg in PM.

## 2023-02-28 NOTE — Assessment & Plan Note (Signed)
Repeat renal function pending. No ACE/ARB given advanced renal disease.  Avoid NSAIDs

## 2023-02-28 NOTE — Assessment & Plan Note (Signed)
Declines pneumonia vaccine. Discussed Shingrix. Colonoscopy overdue, he declines given age.  PSA due and pending.  Discussed the importance of a healthy diet and regular exercise in order for weight loss, and to reduce the risk of further co-morbidity.  Exam stable. Labs pending.  Follow up in 1 year for repeat physical.

## 2023-02-28 NOTE — Patient Instructions (Addendum)
Stop by the lab prior to leaving today. I will notify you of your results once received.   It was a pleasure to see you today!  

## 2023-02-28 NOTE — Assessment & Plan Note (Signed)
Chronic, still present on exam. No complications. Recommended wound care referral for which he declines.  Continue to monitor.

## 2023-02-28 NOTE — Assessment & Plan Note (Addendum)
No claudication. Chronic ulcer to right lower extremity.  Following with cardiology. Continue to monitor. Continue aspirin 81 mg.   He declines wound care referral

## 2023-02-28 NOTE — Assessment & Plan Note (Signed)
Asymptomatic.  Following with cardiology.   Continue carvedilol 12.5 mg in AM and 25 mg in PM, hydralazine 100 mg BID, Imdur 60 mg in AM and 15 mg in PM.

## 2023-04-22 ENCOUNTER — Other Ambulatory Visit: Payer: Self-pay | Admitting: Cardiovascular Disease

## 2023-04-23 NOTE — Telephone Encounter (Signed)
Refill request

## 2023-07-22 ENCOUNTER — Telehealth: Payer: Self-pay | Admitting: Cardiovascular Disease

## 2023-07-22 MED ORDER — ISOSORBIDE MONONITRATE ER 30 MG PO TB24: ORAL_TABLET | ORAL | 1 refills | Status: DC

## 2023-07-22 NOTE — Telephone Encounter (Signed)
Pt's medication was sent to pt's pharmacy as requested. Confirmation received.  °

## 2023-07-22 NOTE — Telephone Encounter (Signed)
*  STAT* If patient is at the pharmacy, call can be transferred to refill team.   1. Which medications need to be refilled? (please list name of each medication and dose if known)  isosorbide mononitrate (IMDUR) 30 MG 24 hr tablet    2. Which pharmacy/location (including street and city if local pharmacy) is medication to be sent to? [facility] - [place], [place] - [address]  3. Do they need a 30 day or 90 day supply?  90 day supply

## 2023-09-10 ENCOUNTER — Telehealth: Payer: Self-pay | Admitting: Primary Care

## 2023-09-10 NOTE — Telephone Encounter (Signed)
Copied from CRM [id number]. Topic: Medicare AWV >> Sep 10, 2023  1:19 PM Payton Doughty wrote: Reason for CRM: LVM 09/10/23 to [facility] AWV due to sched w/wrong office khc. New AWV appt date 10/31/2023 at 11:30am. Please confirm date change.  Verlee Rossetti; Care Guide Ambulatory Clinical Support [place] [facility] Direct Dial: [phone]

## 2023-09-25 ENCOUNTER — Other Ambulatory Visit: Payer: Self-pay | Admitting: Cardiovascular Disease

## 2023-09-27 ENCOUNTER — Other Ambulatory Visit: Payer: Self-pay | Admitting: Cardiovascular Disease

## 2023-09-30 NOTE — Telephone Encounter (Signed)
Refill Request.  

## 2023-10-31 ENCOUNTER — Other Ambulatory Visit: Payer: Self-pay | Admitting: Cardiovascular Disease

## 2023-10-31 ENCOUNTER — Ambulatory Visit: Payer: Medicare Other

## 2023-10-31 VITALS — Ht 73.0 in | Wt 163.0 lb

## 2023-10-31 DIAGNOSIS — Z0000 Encounter for general adult medical examination without abnormal findings: Secondary | ICD-10-CM | POA: Diagnosis not present

## 2023-10-31 NOTE — Progress Notes (Signed)
Subjective:   Michael Chang is a 78 y.o. male who presents for Medicare Annual/Subsequent preventive examination.  Visit Complete: Virtual I connected with  [name] on 10/31/23 by a audio enabled telemedicine application and verified that I am speaking with the correct person using two identifiers.  Patient Location: Home  Provider Location: Office/Clinic  I discussed the limitations of evaluation and management by telemedicine. The patient expressed understanding and agreed to proceed.  Vital Signs: Because this visit was a virtual/telehealth visit, some criteria may be missing or patient reported. Any vitals not documented were not able to be obtained and vitals that have been documented are patient reported.  Patient Medicare AWV questionnaire was completed by the patient on (not done); I have confirmed that all information answered by patient is correct and no changes since this date.  Cardiac Risk Factors include: advanced age (>28men, >83 women);hypertension;male gender    Objective:    Today's Vitals   10/31/23 1137  Weight: 163 lb (73.9 kg)  Height: 6\' 1"  (1.854 m)   Body mass index is 21.51 kg/m.     10/31/2023   11:48 AM 10/29/2022    8:48 AM 10/26/2021    9:49 AM 02/14/2020    2:51 AM  Advanced Directives  Does Patient Have a Medical Advance Directive? No No No No  Would patient like information on creating a medical advance directive?  No - Patient declined Yes (MAU/Ambulatory/Procedural Areas - Information given) No - Patient declined    Current Medications (verified) Outpatient Encounter Medications as of 10/31/2023  Medication Sig   amLODipine (NORVASC) 10 MG tablet TAKE 1 TABLET BY MOUTH DAILY   aspirin 81 MG EC tablet Take by mouth.   carvedilol (COREG) 25 MG tablet TAKE ONE-HALF TABLET BY MOUTH IN THE MORNING AND 1 TABLET BY  MOUTH IN THE EVENING   ferrous sulfate 325 (65 FE) MG tablet Take 325 mg by mouth daily with breakfast.   hydrALAZINE  (APRESOLINE) 100 MG tablet TAKE 1 TABLET BY MOUTH TWICE  DAILY   isosorbide mononitrate (IMDUR) 30 MG 24 hr tablet TAKE 2 TABLETS BY MOUTH IN THE  MORNING AND TAKE ONE-HALF TABLET BY MOUTH IN THE EVENING   Multiple Vitamins-Minerals (CENTRUM SILVER 50+MEN) TABS Take by mouth.   No facility-administered encounter medications on file as of 10/31/2023.    Allergies (verified) Patient has no known allergies.   History: Past Medical History:  Diagnosis Date   Anemia    Anemia in chronic kidney disease 03/02/2020   CAD (coronary artery disease)    Cataract (lens) fragments in eye following cataract surgery, right eye 03/15/2020   Chronic combined systolic (congestive) and diastolic (congestive) heart failure (HCC)    Chronic ulcer of right lower extremity (HCC)    CKD (chronic kidney disease), stage III (HCC)    Essential hypertension    HLD (hyperlipidemia)    Near syncope 02/14/2020   NSTEMI (non-ST elevated myocardial infarction) (HCC)    PVD (peripheral vascular disease) (HCC)    No past surgical history on file. No family history on file. Social History   Socioeconomic History   Marital status: Single    Spouse name: Not on file   Number of children: Not on file   Years of education: Not on file   Highest education level: Not on file  Occupational History   Not on file  Tobacco Use   Smoking status: Never   Smokeless tobacco: Never  Substance and Sexual Activity  Alcohol use: Not Currently   Drug use: Not on file   Sexual activity: Not on file  Other Topics Concern   Not on file  Social History Narrative   Not on file   Social Drivers of Health   Financial Resource Strain: High Risk (10/31/2023)   Overall Financial Resource Strain (CARDIA)    Difficulty of Paying Living Expenses: Very hard  Food Insecurity: No Food Insecurity (10/31/2023)   Hunger Vital Sign    Worried About Running Out of Food in the Last Year: Never true    Ran Out of Food in the Last Year:  Never true  Transportation Needs: No Transportation Needs (10/31/2023)   PRAPARE - Administrator, Civil Service (Medical): No    Lack of Transportation (Non-Medical): No  Physical Activity: Sufficiently Active (10/31/2023)   Exercise Vital Sign    Days of Exercise per Week: 4 days    Minutes of Exercise per Session: 40 min  Stress: No Stress Concern Present (10/31/2023)   [employer] of Occupational Health - Occupational Stress Questionnaire    Feeling of Stress : Not at all  Social Connections: Socially Isolated (10/31/2023)   Social Connection and Isolation Panel [NHANES]    Frequency of Communication with Friends and Family: Once a week    Frequency of Social Gatherings with Friends and Family: Never    Attends Religious Services: Never    Database administrator or Organizations: No    Attends Engineer, structural: Never    Marital Status: Never married    Tobacco Counseling Counseling given: Not Answered   Clinical Intake:  Pre-visit preparation completed: No  Pain : No/denies pain    BMI - recorded: 21.51 Nutritional Status: BMI of 19-24  Normal Nutritional Risks: None Diabetes: No  How often do you need to have someone help you when you read instructions, pamphlets, or other written materials from your doctor or pharmacy?: 1 - Never  Interpreter Needed?: No  Comments: lives alone Information entered by :: B.Philisha Weinel,LPN   Activities of Daily Living    10/31/2023   11:49 AM  In your present state of health, do you have any difficulty performing the following activities:  Hearing? 0  Vision? 0  Difficulty concentrating or making decisions? 0  Walking or climbing stairs? 0  Dressing or bathing? 0  Doing errands, shopping? 1  Preparing Food and eating ? N  Using the Toilet? N  In the past six months, have you accidently leaked urine? N  Do you have problems with loss of bowel control? N  Managing your Medications? N  Managing your  Finances? N  Housekeeping or managing your Housekeeping? N    Patient Care Team: Doreene Nest, NP as PCP - General (Internal Medicine) Iran Ouch, MD as PCP - Cardiology (Cardiology) Kathyrn Sheriff, [facility] (Inactive) as Pharmacist (Pharmacist)  Indicate any recent Medical Services you may have received from other than [facility] providers in the past year (date may be approximate).     Assessment:   This is a routine wellness examination for Azarian.  Hearing/Vision screen Hearing Screening - Comments:: Pt says his hearing is good w/no problems Vision Screening - Comments:: Pt says his vision is good   Goals Addressed               This Visit's Progress     Maintain healthy lifestyle (pt-stated)   On track     10/31/23 Stay active Healthy diet  Stay hydrated      COMPLETED: Patient Stated   On track     02/17/2020,  I will maintain and continue medications as prescribed.       COMPLETED: Patient Stated   On track     Would like to continue to drink more water       Depression Screen    10/31/2023   11:44 AM 02/28/2023    9:12 AM 10/29/2022    8:52 AM 02/22/2022   11:23 AM 10/26/2021    9:51 AM 02/10/2021    8:28 AM 02/17/2020   11:28 AM  PHQ 2/9 Scores  PHQ - 2 Score 0 0 0 0 0 0 0  PHQ- 9 Score    0  0 0    Fall Risk    10/31/2023   11:40 AM 02/28/2023    9:12 AM 10/29/2022    8:49 AM 10/26/2021    9:50 AM 02/10/2021    8:28 AM  Fall Risk   Falls in the past year? 0 0 0 0 0  Number falls in past yr: 0 0 0 0 0  Injury with Fall? 0 0  0 0  Risk for fall due to : No Fall Risks No Fall Risks     Follow up Falls prevention discussed;Education provided Falls evaluation completed Falls evaluation completed;Falls prevention discussed Falls prevention discussed     MEDICARE RISK AT HOME: Medicare Risk at Home Any stairs in or around the home?: No If so, are there any without handrails?: No Home free of loose throw rugs in walkways, pet beds, electrical  cords, etc?: Yes Adequate lighting in your home to reduce risk of falls?: Yes Life alert?: No Use of a cane, walker or w/c?: Yes (cane) Grab bars in the bathroom?: Yes Shower chair or bench in shower?: No (has one if needs) Elevated toilet seat or a handicapped toilet?: Yes  TIMED UP AND GO:  Was the test performed?  No    Cognitive Function:    02/17/2020   11:31 AM  MMSE - Mini Mental State Exam  Not completed: Unable to complete        10/31/2023   11:50 AM 10/29/2022    8:57 AM  6CIT Screen  What Year? 0 points 0 points  What month? 0 points 0 points  What time? 0 points 0 points  Count back from 20 0 points 0 points  Months in reverse 0 points 0 points  Repeat phrase 0 points 0 points  Total Score 0 points 0 points    Immunizations  There is no immunization history on file for this patient.  TDAP status: Up to date  Flu Vaccine status: Declined, Education has been provided regarding the importance of this vaccine but patient still declined. Advised may receive this vaccine at local pharmacy or Health Dept. Aware to provide a copy of the vaccination record if obtained from local pharmacy or Health Dept. Verbalized acceptance and understanding.  Pneumococcal vaccine status: Declined,  Education has been provided regarding the importance of this vaccine but patient still declined. Advised may receive this vaccine at local pharmacy or Health Dept. Aware to provide a copy of the vaccination record if obtained from local pharmacy or Health Dept. Verbalized acceptance and understanding.   Covid-19 vaccine status: Declined, Education has been provided regarding the importance of this vaccine but patient still declined. Advised may receive this vaccine at local pharmacy or Health Dept.or vaccine clinic. Aware to provide a copy  of the vaccination record if obtained from local pharmacy or Health Dept. Verbalized acceptance and understanding.  Qualifies for Shingles Vaccine? Yes    Zostavax completed No   Shingrix Completed?: No.    Education has been provided regarding the importance of this vaccine. Patient has been advised to call insurance company to determine out of pocket expense if they have not yet received this vaccine. Advised may also receive vaccine at local pharmacy or Health Dept. Verbalized acceptance and understanding.  Screening Tests Health Maintenance  Topic Date Due   DTaP/Tdap/Td (1 - Tdap) Never done   INFLUENZA VACCINE  Never done   Medicare Annual Wellness (AWV)  10/30/2024   Hepatitis C Screening  Completed   HPV VACCINES  Aged Out   Pneumonia Vaccine 35+ Years old  Discontinued   Colonoscopy  Discontinued   COVID-19 Vaccine  Discontinued   Zoster Vaccines- Shingrix  Discontinued    Health Maintenance  Health Maintenance Due  Topic Date Due   DTaP/Tdap/Td (1 - Tdap) Never done   INFLUENZA VACCINE  Never done    Colorectal cancer screening: No longer required.   Lung Cancer Screening: (Low Dose CT Chest recommended if Age 81-80 years, 20 pack-year currently smoking OR have quit w/in 15years.) does not qualify.   Lung Cancer Screening Referral: no  Additional Screening:  Hepatitis C Screening: does not qualify; Completed 03/25/2020  Vision Screening: Recommended annual ophthalmology exams for early detection of glaucoma and other disorders of the eye. Is the patient up to date with their annual eye exam?  Yes  Who is the provider or what is the name of the office in which the patient attends annual eye exams? Cannot remember-Dr. Karie Soda per chart If pt is not established with a provider, would they like to be referred to a provider to establish care? No .   Dental Screening: Recommended annual dental exams for proper oral hygiene  Diabetic Foot Exam:   Community Resource Referral / Chronic Care Management: CRR required this visit?  No   CCM required this visit?  Appt scheduled with PCP    Plan:     I have personally  reviewed and noted the following in the patient's chart:   Medical and social history Use of alcohol, tobacco or illicit drugs  Current medications and supplements including opioid prescriptions. Patient is not currently taking opioid prescriptions. Functional ability and status Nutritional status Physical activity Advanced directives List of other physicians Hospitalizations, surgeries, and ER visits in previous 12 months Vitals Screenings to include cognitive, depression, and falls Referrals and appointments  In addition, I have reviewed and discussed with patient certain preventive protocols, quality metrics, and best practice recommendations. A written personalized care plan for preventive services as well as general preventive health recommendations were provided to patient.    Sue Lush, LPN   1/61/0960   After Visit Summary: (Declined) Due to this being a telephonic visit, with patients personalized plan was offered to patient but patient Declined AVS at this time   Nurse Notes: The patient states he is doing well and has no concerns or questions at this time.

## 2023-10-31 NOTE — Telephone Encounter (Signed)
Refill Request.

## 2023-12-08 ENCOUNTER — Other Ambulatory Visit: Payer: Self-pay | Admitting: Cardiovascular Disease

## 2023-12-09 NOTE — Telephone Encounter (Signed)
 Refill request

## 2024-01-08 ENCOUNTER — Other Ambulatory Visit: Payer: Self-pay | Admitting: Cardiovascular Disease

## 2024-02-02 ENCOUNTER — Other Ambulatory Visit: Payer: Self-pay | Admitting: Cardiovascular Disease

## 2024-02-03 NOTE — Telephone Encounter (Signed)
 Refill request

## 2024-02-21 ENCOUNTER — Other Ambulatory Visit: Payer: Self-pay | Admitting: Cardiovascular Disease

## 2024-02-25 ENCOUNTER — Ambulatory Visit: Attending: Cardiovascular Disease | Admitting: Cardiovascular Disease

## 2024-02-25 VITALS — BP 118/70 | HR 83 | Ht 73.0 in | Wt 144.0 lb

## 2024-02-25 DIAGNOSIS — N184 Chronic kidney disease, stage 4 (severe): Secondary | ICD-10-CM | POA: Diagnosis not present

## 2024-02-25 DIAGNOSIS — I10 Essential (primary) hypertension: Secondary | ICD-10-CM | POA: Diagnosis not present

## 2024-02-25 DIAGNOSIS — I5032 Chronic diastolic (congestive) heart failure: Secondary | ICD-10-CM | POA: Diagnosis not present

## 2024-02-25 MED ORDER — AMLODIPINE BESYLATE 10 MG PO TABS: 10.0000 mg | ORAL_TABLET | Freq: Every day | ORAL | 3 refills | Status: DC

## 2024-02-25 MED ORDER — CARVEDILOL 25 MG PO TABS: ORAL_TABLET | ORAL | 3 refills | Status: DC

## 2024-02-25 NOTE — Progress Notes (Signed)
 Cardiology Office Note   Date:  02/25/2024   ID:  Dametri Ozburn, DOB 29-Jul-1946, MRN [id number]  PCP:  Gabriel John, NP  Cardiologist:   Antionette Kirks, MD   No chief complaint on file.     History of Present Illness: Michael Chang is a 78 y.o. male who is here today for a follow-up visit regarding hypertensive heart disease and possible underlying coronary artery disease.   He is nearly blind and moved to [place]  in 2021 to be closer to his sister. He also has history of peripheral arterial disease with lower extremity ulceration but ABI in March was normal. The patient did not seek medical attention for many years at least since 2007 and was living in a poor living condition by himself. He was diagnosed with heart failure in [place]  and March 2021 with an EF of 40 to 45%.  He did have non-ST elevation myocardial infarction but was treated medically.  He does have chronic kidney disease with creatinine of 2.77.  His initial presentation was with a possible syncopal episode.  He was noted to be anemic and underwent an EGD which showed moderately erythematous stomach mucosa.  Colonoscopy showed internal hemorrhoids.  It was not clear if he had acute kidney injury or whether he had chronic kidney disease. He is not a smoker and quit alcohol many years ago.  He is not diabetic.  He is retired and used to work in [employer].   Repeat echo in 2021 showed an EF of 55 to 60% with severe LVH.     He has been doing well with no chest pain, shortness of breath or palpitations.  He reports good compliance with his medications.  No lower extremity edema.   Past Medical History:  Diagnosis Date   Anemia    Anemia in chronic kidney disease 03/02/2020   CAD (coronary artery disease)    Cataract (lens) fragments in eye following cataract surgery, right eye 03/15/2020   Chronic combined systolic (congestive) and diastolic (congestive) heart failure (HCC)    Chronic ulcer of right  lower extremity (HCC)    CKD (chronic kidney disease), stage III (HCC)    Essential hypertension    HLD (hyperlipidemia)    Near syncope 02/14/2020   NSTEMI (non-ST elevated myocardial infarction) (HCC)    PVD (peripheral vascular disease) (HCC)     No past surgical history on file.   Current Outpatient Medications  Medication Sig Dispense Refill   amLODipine  (NORVASC ) 10 MG tablet TAKE 1 TABLET BY MOUTH DAILY 100 tablet 2   aspirin  81 MG EC tablet Take by mouth.     carvedilol  (COREG ) 25 MG tablet TAKE ONE-HALF TABLET BY MOUTH IN THE MORNING AND 1 TABLET BY  MOUTH IN THE EVENING 135 tablet 0   ferrous sulfate  325 (65 FE) MG tablet Take 325 mg by mouth daily with breakfast.     hydrALAZINE  (APRESOLINE ) 100 MG tablet TAKE 1 TABLET BY MOUTH TWICE  DAILY 60 tablet 3   isosorbide  mononitrate (IMDUR ) 30 MG 24 hr tablet TAKE 2 TABLETS BY MOUTH IN THE  MORNING AND TAKE ONE-HALF TABLET BY MOUTH IN THE EVENING 225 tablet 2   Multiple Vitamins-Minerals (CENTRUM SILVER 50+MEN) TABS Take by mouth.     No current facility-administered medications for this visit.    Allergies:   Patient has no known allergies.    Social History:  The patient  reports that he has never smoked. He has never used  smokeless tobacco. He reports that he does not currently use alcohol.   Family History:  The patient's family history is negative for coronary artery disease.  There is family history of hypertension.   ROS:  Please see the history of present illness.   Otherwise, review of systems are positive for none.   All other systems are reviewed and negative.    PHYSICAL EXAM: VS:  BP 118/70   Pulse 83   Ht 6\' 1"  (1.854 m)   Wt 144 lb (65.3 kg)   SpO2 94%   BMI 19.00 kg/m  , BMI Body mass index is 19 kg/m. GEN: Well nourished, well developed, in no acute distress  HEENT: normal  Neck: no JVD, carotid bruits, or masses Cardiac: RRR; no murmurs, rubs, or gallops,no edema  Respiratory:  clear to  auscultation bilaterally, normal work of breathing GI: soft, nontender, nondistended, + BS MS: no deformity or atrophy  Skin: warm and dry, no rash Neuro:  Strength and sensation are intact Psych: euthymic mood, full affect   EKG:  EKG is ordered today. The ekg ordered today demonstrates: Sinus rhythm with Premature atrial complexes       Recent Labs: 02/28/2023: ALT 17; BUN 21; Creatinine, Ser 2.03; Hemoglobin 12.9; Platelets 253.0; Potassium 4.1; Sodium 142    Lipid Panel    Component Value Date/Time   CHOL 152 02/28/2023 0938   TRIG 90.0 02/28/2023 0938   HDL 41.90 02/28/2023 0938   CHOLHDL 4 02/28/2023 0938   VLDL 18.0 02/28/2023 0938   LDLCALC 92 02/28/2023 0938      Wt Readings from Last 3 Encounters:  02/25/24 144 lb (65.3 kg)  10/31/23 163 lb (73.9 kg)  02/28/23 163 lb (73.9 kg)           No data to display            ASSESSMENT AND PLAN:  1.  Chronic diastolic heart failure with hypertensive heart disease: Most recent echocardiogram showed normal LV systolic function.  He appears to be euvolemic without any diuretics.  Continue blood pressure control.   2.  Essential hypertension: His blood pressure is well-controlled on current medications.   Continue amlodipine , carvedilol , hydralazine  and Imdur .  He has advanced chronic kidney disease and thus no ACE inhibitor or ARB.  Most recent creatinine was 2.03. We refilled all his cardiac medications. He is going on for a physical next week and will get routine labs with his primary care physician.  3.   Chronic kidney disease: Gradual improvement in renal function with most recent creatinine of 2.03.    Disposition:   FU with me in 12 months  Signed,  Antionette Kirks, MD  02/25/2024 11:23 AM    [facility]

## 2024-02-25 NOTE — Patient Instructions (Signed)
 Medication Instructions:  No changes *If you need a refill on your cardiac medications before your next appointment, please call your pharmacy*  Lab Work: None ordered  If you have labs (blood work) drawn today and your tests are completely normal, you will receive your results only by: MyChart Message (if you have MyChart) OR A paper copy in the mail If you have any lab test that is abnormal or we need to change your treatment, we will call you to review the results.  Testing/Procedures: None ordered  Follow-Up: At [facility], you and your health needs are our priority.  As part of our continuing mission to provide you with exceptional heart care, our providers are all part of one team.  This team includes your primary Cardiologist (physician) and Advanced Practice Providers or APPs (Physician Assistants and Nurse Practitioners) who all work together to provide you with the care you need, when you need it.  Your next appointment:   12 month(s)  Provider:   Antionette Kirks, MD    We recommend signing up for the patient portal called "MyChart".  Sign up information is provided on this After Visit Summary.  MyChart is used to connect with patients for Virtual Visits (Telemedicine).  Patients are able to view lab/test results, encounter notes, upcoming appointments, etc.  Non-urgent messages can be sent to your provider as well.   To learn more about what you can do with MyChart, go to [email or web address].

## 2024-03-05 ENCOUNTER — Encounter: Payer: Self-pay | Admitting: Primary Care

## 2024-03-05 ENCOUNTER — Ambulatory Visit: Payer: Medicare Other | Admitting: Primary Care

## 2024-03-05 VITALS — BP 142/76 | HR 74 | Temp 97.2°F | Ht 73.0 in | Wt 143.0 lb

## 2024-03-05 DIAGNOSIS — R946 Abnormal results of thyroid function studies: Secondary | ICD-10-CM | POA: Diagnosis not present

## 2024-03-05 DIAGNOSIS — D509 Iron deficiency anemia, unspecified: Secondary | ICD-10-CM

## 2024-03-05 DIAGNOSIS — N183 Chronic kidney disease, stage 3 unspecified: Secondary | ICD-10-CM

## 2024-03-05 DIAGNOSIS — R972 Elevated prostate specific antigen [PSA]: Secondary | ICD-10-CM

## 2024-03-05 DIAGNOSIS — R7989 Other specified abnormal findings of blood chemistry: Secondary | ICD-10-CM | POA: Diagnosis not present

## 2024-03-05 DIAGNOSIS — I2510 Atherosclerotic heart disease of native coronary artery without angina pectoris: Secondary | ICD-10-CM

## 2024-03-05 DIAGNOSIS — I5022 Chronic systolic (congestive) heart failure: Secondary | ICD-10-CM

## 2024-03-05 DIAGNOSIS — H6120 Impacted cerumen, unspecified ear: Secondary | ICD-10-CM | POA: Insufficient documentation

## 2024-03-05 DIAGNOSIS — H6123 Impacted cerumen, bilateral: Secondary | ICD-10-CM

## 2024-03-05 DIAGNOSIS — Z0000 Encounter for general adult medical examination without abnormal findings: Secondary | ICD-10-CM | POA: Diagnosis not present

## 2024-03-05 LAB — COMPREHENSIVE METABOLIC PANEL WITH GFR
ALT: 19 U/L (ref 0–53)
AST: 27 U/L (ref 0–37)
Albumin: 3.6 g/dL (ref 3.5–5.2)
Alkaline Phosphatase: 50 U/L (ref 39–117)
BUN: 24 mg/dL — ABNORMAL HIGH (ref 6–23)
CO2: 29 meq/L (ref 19–32)
Calcium: 9.3 mg/dL (ref 8.4–10.5)
Chloride: 104 meq/L (ref 96–112)
Creatinine, Ser: 2.03 mg/dL — ABNORMAL HIGH (ref 0.40–1.50)
GFR: 30.95 mL/min — ABNORMAL LOW (ref >60.00)
Glucose, Bld: 97 mg/dL (ref 70–99)
Potassium: 5 meq/L (ref 3.5–5.1)
Sodium: 143 meq/L (ref 135–145)
Total Bilirubin: 0.4 mg/dL (ref 0.2–1.2)
Total Protein: 7.9 g/dL (ref 6.0–8.3)

## 2024-03-05 LAB — LIPID PANEL
Cholesterol: 135 mg/dL (ref 0–200)
HDL: 39.3 mg/dL (ref >39.00)
LDL Cholesterol: 79 mg/dL (ref 0–99)
NonHDL: 95.86
Total CHOL/HDL Ratio: 3
Triglycerides: 85 mg/dL (ref 0.0–149.0)
VLDL: 17 mg/dL (ref 0.0–40.0)

## 2024-03-05 LAB — TSH: TSH: 3.41 u[IU]/mL (ref 0.35–5.50)

## 2024-03-05 LAB — CBC
HCT: 37.8 % — ABNORMAL LOW (ref 39.0–52.0)
Hemoglobin: 12.5 g/dL — ABNORMAL LOW (ref 13.0–17.0)
MCHC: 33.1 g/dL (ref 30.0–36.0)
MCV: 86.5 fl (ref 78.0–100.0)
Platelets: 248 10*3/uL (ref 150.0–400.0)
RBC: 4.37 Mil/uL (ref 4.22–5.81)
RDW: 16.6 % — ABNORMAL HIGH (ref 11.5–15.5)
WBC: 4.7 10*3/uL (ref 4.0–10.5)

## 2024-03-05 NOTE — Assessment & Plan Note (Signed)
 Bilateral cerumen impaction identified on exam. Patient consented to irrigation of canals bilaterally.  Bilateral canals irrigated. Patient tolerated well. TM's and canals post irrigation unremarkable.   Discussed home care instructions.

## 2024-03-05 NOTE — Assessment & Plan Note (Signed)
 Repeat renal function pending.  Not on ACE/ARB given advanced renal disease. Continue to avoid NSAIDs

## 2024-03-05 NOTE — Assessment & Plan Note (Signed)
 Declines pneumonia vaccine and Shingrix vaccines. Discontinue colonoscopy screening given age. Discontinue PSA screening given age and refusal to see urology.  Discussed the importance of a healthy diet and regular exercise in order for weight loss, and to reduce the risk of further co-morbidity.  Exam stable. Labs pending.  Follow up in 1 year for repeat physical.

## 2024-03-05 NOTE — Assessment & Plan Note (Signed)
 Asymptomatic.   Following with cardiology, office notes reviewed from May 2025. Continue aspirin  81 mg daily, lipid control, BP control.

## 2024-03-05 NOTE — Progress Notes (Signed)
 Subjective:    Patient ID: Michael Chang, male    DOB: [birth year], 78 y.o.   MRN: [id number]  HPI  Michael Chang is a very pleasant 78 y.o. male who presents today for complete physical and follow up of chronic conditions.  Immunizations: -Shingles: Never completed, declines  -Pneumonia: Never completed   Diet: Fair diet.  Exercise: No regular exercise.  Eye exam: Completed years ago  Dental exam: Completed years ago   Colonoscopy: Declines   PSA: Discontinued due to age. He declines to see Urology for slightly elevated PSA previously.   BP Readings from Last 3 Encounters:  03/05/24 (!) 142/76  02/25/24 118/70  02/28/23 132/74         Review of Systems  Constitutional:  Negative for unexpected weight change.  HENT:  Positive for hearing loss. Negative for rhinorrhea.   Respiratory:  Negative for cough and shortness of breath.   Cardiovascular:  Negative for chest pain.  Gastrointestinal:  Negative for constipation and diarrhea.  Genitourinary:  Negative for difficulty urinating.  Musculoskeletal:  Positive for arthralgias. Negative for myalgias.  Skin:  Negative for rash.  Allergic/Immunologic: Negative for environmental allergies.  Neurological:  Negative for dizziness, numbness and headaches.  Psychiatric/Behavioral:  The patient is not nervous/anxious.          Past Medical History:  Diagnosis Date   Anemia    Anemia in chronic kidney disease 03/02/2020   CAD (coronary artery disease)    Cataract (lens) fragments in eye following cataract surgery, right eye 03/15/2020   Chronic combined systolic (congestive) and diastolic (congestive) heart failure (HCC)    Chronic ulcer of right lower extremity (HCC)    CKD (chronic kidney disease), stage III (HCC)    Essential hypertension    HLD (hyperlipidemia)    Near syncope 02/14/2020   NSTEMI (non-ST elevated myocardial infarction) (HCC)    PVD (peripheral vascular disease) (HCC)     Social History    Socioeconomic History   Marital status: Single    Spouse name: Not on file   Number of children: Not on file   Years of education: Not on file   Highest education level: Not on file  Occupational History   Not on file  Tobacco Use   Smoking status: Never   Smokeless tobacco: Never  Substance and Sexual Activity   Alcohol use: Not Currently   Drug use: Not on file   Sexual activity: Not on file  Other Topics Concern   Not on file  Social History Narrative   Not on file   Social Drivers of Health   Financial Resource Strain: High Risk (10/31/2023)   Overall Financial Resource Strain (CARDIA)    Difficulty of Paying Living Expenses: Very hard  Food Insecurity: No Food Insecurity (10/31/2023)   Hunger Vital Sign    Worried About Running Out of Food in the Last Year: Never true    Ran Out of Food in the Last Year: Never true  Transportation Needs: No Transportation Needs (10/31/2023)   PRAPARE - Administrator, Civil Service (Medical): No    Lack of Transportation (Non-Medical): No  Physical Activity: Sufficiently Active (10/31/2023)   Exercise Vital Sign    Days of Exercise per Week: 4 days    Minutes of Exercise per Session: 40 min  Stress: No Stress Concern Present (10/31/2023)   [employer] of Occupational Health - Occupational Stress Questionnaire    Feeling of Stress : Not at all  Social  Connections: Socially Isolated (10/31/2023)   Social Connection and Isolation Panel [NHANES]    Frequency of Communication with Friends and Family: Once a week    Frequency of Social Gatherings with Friends and Family: Never    Attends Religious Services: Never    Database administrator or Organizations: No    Attends Banker Meetings: Never    Marital Status: Never married  Intimate Partner Violence: Not At Risk (10/31/2023)   Humiliation, Afraid, Rape, and Kick questionnaire    Fear of Current or Ex-Partner: No    Emotionally Abused: No    Physically  Abused: No    Sexually Abused: No    No past surgical history on file.  No family history on file.  No Known Allergies  Current Outpatient Medications on File Prior to Visit  Medication Sig Dispense Refill   amLODipine  (NORVASC ) 10 MG tablet Take 1 tablet (10 mg total) by mouth daily. 90 tablet 3   aspirin  81 MG EC tablet Take by mouth.     carvedilol  (COREG ) 25 MG tablet TAKE ONE-HALF TABLET BY MOUTH IN THE MORNING AND 1 TABLET BY  MOUTH IN THE EVENING 135 tablet 3   ferrous sulfate  325 (65 FE) MG tablet Take 325 mg by mouth daily with breakfast.     hydrALAZINE  (APRESOLINE ) 100 MG tablet TAKE 1 TABLET BY MOUTH TWICE  DAILY 60 tablet 3   isosorbide  mononitrate (IMDUR ) 30 MG 24 hr tablet TAKE 2 TABLETS BY MOUTH IN THE  MORNING AND TAKE ONE-HALF TABLET BY MOUTH IN THE EVENING 225 tablet 2   Multiple Vitamins-Minerals (CENTRUM SILVER 50+MEN) TABS Take by mouth.     No current facility-administered medications on file prior to visit.    BP (!) 142/76   Pulse 74   Temp (!) 97.2 F (36.2 C) (Temporal)   Ht 6\' 1"  (1.854 m)   Wt 143 lb (64.9 kg)   SpO2 96%   BMI 18.87 kg/m  Objective:   Physical Exam HENT:     Right Ear: Tympanic membrane and ear canal normal. There is impacted cerumen.     Left Ear: Tympanic membrane and ear canal normal. There is impacted cerumen.  Eyes:     Pupils: Pupils are equal, round, and reactive to light.  Cardiovascular:     Rate and Rhythm: Normal rate and regular rhythm.  Pulmonary:     Effort: Pulmonary effort is normal.     Breath sounds: Normal breath sounds.  Abdominal:     General: Bowel sounds are normal.     Palpations: Abdomen is soft.     Tenderness: There is no abdominal tenderness.  Musculoskeletal:        General: Normal range of motion.     Cervical back: Neck supple.  Skin:    General: Skin is warm and dry.  Neurological:     Mental Status: He is alert and oriented to person, place, and time.     Cranial Nerves: No cranial  nerve deficit.     Deep Tendon Reflexes:     Reflex Scores:      Patellar reflexes are 2+ on the right side and 2+ on the left side. Psychiatric:        Mood and Affect: Mood normal.           Assessment & Plan:  Preventative health care Assessment & Plan: Declines pneumonia vaccine and Shingrix vaccines. Discontinue colonoscopy screening given age. Discontinue PSA screening given age and  refusal to see urology.  Discussed the importance of a healthy diet and regular exercise in order for weight loss, and to reduce the risk of further co-morbidity.  Exam stable. Labs pending.  Follow up in 1 year for repeat physical.    Coronary artery disease involving native heart without angina pectoris, unspecified vessel or lesion type Assessment & Plan: Asymptomatic.   Following with cardiology, office notes reviewed from May 2025. Continue aspirin  81 mg daily, lipid control, BP control.  Orders: -     Lipid panel  Chronic systolic congestive heart failure (HCC) Assessment & Plan: Appears euvolemic today. Following with cardiology, office notes reviewed from May 2025.  Continue carvedilol  12.5 mg in AM and 25 mg in PM.  Orders: -     Comprehensive metabolic panel with GFR  Stage 3 chronic kidney disease, unspecified whether stage 3a or 3b CKD (HCC) Assessment & Plan: Repeat renal function pending.  Not on ACE/ARB given advanced renal disease. Continue to avoid NSAIDs   Elevated PSA Assessment & Plan: Screening discontinued per patient request. He does not wish to see urology.   Elevated TSH Assessment & Plan: Repeat TSH pending.  Orders: -     TSH  Iron deficiency anemia, unspecified iron deficiency anemia type Assessment & Plan: Repeat CBC pending.  Orders: -     CBC  Bilateral impacted cerumen Assessment & Plan: Bilateral cerumen impaction identified on exam. Patient consented to irrigation of canals bilaterally.  Bilateral canals  irrigated. Patient tolerated well. TM's and canals post irrigation unremarkable.   Discussed home care instructions.           Kambria Grima K Kevin Space, NP

## 2024-03-05 NOTE — Assessment & Plan Note (Signed)
 Appears euvolemic today. Following with cardiology, office notes reviewed from May 2025.  Continue carvedilol  12.5 mg in AM and 25 mg in PM.

## 2024-03-05 NOTE — Assessment & Plan Note (Signed)
 Repeat CBC pending.

## 2024-03-05 NOTE — Patient Instructions (Signed)
 Stop by the lab prior to leaving today. I will notify you of your results once received.   It was a pleasure to see you today!

## 2024-03-05 NOTE — Assessment & Plan Note (Signed)
Repeat TSH pending

## 2024-03-05 NOTE — Assessment & Plan Note (Signed)
 Screening discontinued per patient request. He does not wish to see urology.

## 2024-03-06 ENCOUNTER — Ambulatory Visit: Payer: Self-pay | Admitting: Primary Care

## 2024-04-04 ENCOUNTER — Other Ambulatory Visit: Payer: Self-pay | Admitting: Cardiovascular Disease

## 2024-05-10 ENCOUNTER — Other Ambulatory Visit: Payer: Self-pay | Admitting: Cardiovascular Disease

## 2024-06-11 ENCOUNTER — Inpatient Hospital Stay (HOSPITAL_COMMUNITY)
Admission: EM | Admit: 2024-06-11 | Discharge: 2024-06-16 | DRG: 956 | Disposition: A | Discharge status: Skilled Nursing Facility | Attending: Internal Medicine | Admitting: Internal Medicine

## 2024-06-11 ENCOUNTER — Emergency Department (HOSPITAL_COMMUNITY)

## 2024-06-11 ENCOUNTER — Encounter (HOSPITAL_COMMUNITY): Payer: Self-pay | Admitting: Internal Medicine

## 2024-06-11 ENCOUNTER — Other Ambulatory Visit: Payer: Self-pay

## 2024-06-11 DIAGNOSIS — R55 Syncope and collapse: Principal | ICD-10-CM

## 2024-06-11 DIAGNOSIS — S72002A Fracture of unspecified part of neck of left femur, initial encounter for closed fracture: Secondary | ICD-10-CM | POA: Diagnosis present

## 2024-06-11 DIAGNOSIS — D631 Anemia in chronic kidney disease: Secondary | ICD-10-CM | POA: Diagnosis present

## 2024-06-11 DIAGNOSIS — C099 Malignant neoplasm of tonsil, unspecified: Secondary | ICD-10-CM | POA: Diagnosis not present

## 2024-06-11 DIAGNOSIS — T796XXA Traumatic ischemia of muscle, initial encounter: Secondary | ICD-10-CM | POA: Diagnosis not present

## 2024-06-11 DIAGNOSIS — I499 Cardiac arrhythmia, unspecified: Secondary | ICD-10-CM | POA: Diagnosis not present

## 2024-06-11 DIAGNOSIS — M6281 Muscle weakness (generalized): Secondary | ICD-10-CM | POA: Diagnosis not present

## 2024-06-11 DIAGNOSIS — N183 Chronic kidney disease, stage 3 unspecified: Secondary | ICD-10-CM | POA: Diagnosis present

## 2024-06-11 DIAGNOSIS — M1712 Unilateral primary osteoarthritis, left knee: Secondary | ICD-10-CM | POA: Diagnosis not present

## 2024-06-11 DIAGNOSIS — R404 Transient alteration of awareness: Secondary | ICD-10-CM | POA: Diagnosis not present

## 2024-06-11 DIAGNOSIS — I5022 Chronic systolic (congestive) heart failure: Secondary | ICD-10-CM | POA: Diagnosis not present

## 2024-06-11 DIAGNOSIS — I5042 Chronic combined systolic (congestive) and diastolic (congestive) heart failure: Secondary | ICD-10-CM | POA: Diagnosis present

## 2024-06-11 DIAGNOSIS — R197 Diarrhea, unspecified: Secondary | ICD-10-CM | POA: Diagnosis not present

## 2024-06-11 DIAGNOSIS — S72052A Unspecified fracture of head of left femur, initial encounter for closed fracture: Principal | ICD-10-CM | POA: Diagnosis present

## 2024-06-11 DIAGNOSIS — R531 Weakness: Secondary | ICD-10-CM | POA: Diagnosis not present

## 2024-06-11 DIAGNOSIS — K1379 Other lesions of oral mucosa: Secondary | ICD-10-CM

## 2024-06-11 DIAGNOSIS — Z800 Family history of malignant neoplasm of digestive organs: Secondary | ICD-10-CM | POA: Diagnosis not present

## 2024-06-11 DIAGNOSIS — I130 Hypertensive heart and chronic kidney disease with heart failure and stage 1 through stage 4 chronic kidney disease, or unspecified chronic kidney disease: Secondary | ICD-10-CM | POA: Diagnosis present

## 2024-06-11 DIAGNOSIS — R1311 Dysphagia, oral phase: Secondary | ICD-10-CM | POA: Diagnosis not present

## 2024-06-11 DIAGNOSIS — M25562 Pain in left knee: Secondary | ICD-10-CM | POA: Diagnosis not present

## 2024-06-11 DIAGNOSIS — J392 Other diseases of pharynx: Secondary | ICD-10-CM

## 2024-06-11 DIAGNOSIS — F1190 Opioid use, unspecified, uncomplicated: Secondary | ICD-10-CM | POA: Diagnosis present

## 2024-06-11 DIAGNOSIS — M25552 Pain in left hip: Secondary | ICD-10-CM | POA: Diagnosis not present

## 2024-06-11 DIAGNOSIS — S72042A Displaced fracture of base of neck of left femur, initial encounter for closed fracture: Secondary | ICD-10-CM | POA: Diagnosis not present

## 2024-06-11 DIAGNOSIS — E875 Hyperkalemia: Secondary | ICD-10-CM | POA: Diagnosis present

## 2024-06-11 DIAGNOSIS — Y92009 Unspecified place in unspecified non-institutional (private) residence as the place of occurrence of the external cause: Secondary | ICD-10-CM

## 2024-06-11 DIAGNOSIS — M7062 Trochanteric bursitis, left hip: Secondary | ICD-10-CM | POA: Diagnosis not present

## 2024-06-11 DIAGNOSIS — Z604 Social exclusion and rejection: Secondary | ICD-10-CM | POA: Diagnosis present

## 2024-06-11 DIAGNOSIS — M4802 Spinal stenosis, cervical region: Secondary | ICD-10-CM | POA: Diagnosis not present

## 2024-06-11 DIAGNOSIS — I2510 Atherosclerotic heart disease of native coronary artery without angina pectoris: Secondary | ICD-10-CM | POA: Diagnosis not present

## 2024-06-11 DIAGNOSIS — Z833 Family history of diabetes mellitus: Secondary | ICD-10-CM

## 2024-06-11 DIAGNOSIS — E785 Hyperlipidemia, unspecified: Secondary | ICD-10-CM | POA: Diagnosis present

## 2024-06-11 DIAGNOSIS — S199XXA Unspecified injury of neck, initial encounter: Secondary | ICD-10-CM | POA: Diagnosis not present

## 2024-06-11 DIAGNOSIS — D696 Thrombocytopenia, unspecified: Secondary | ICD-10-CM | POA: Diagnosis present

## 2024-06-11 DIAGNOSIS — N1832 Chronic kidney disease, stage 3b: Secondary | ICD-10-CM | POA: Diagnosis present

## 2024-06-11 DIAGNOSIS — R1312 Dysphagia, oropharyngeal phase: Secondary | ICD-10-CM | POA: Diagnosis not present

## 2024-06-11 DIAGNOSIS — M7652 Patellar tendinitis, left knee: Secondary | ICD-10-CM | POA: Diagnosis not present

## 2024-06-11 DIAGNOSIS — I739 Peripheral vascular disease, unspecified: Secondary | ICD-10-CM | POA: Diagnosis present

## 2024-06-11 DIAGNOSIS — Z66 Do not resuscitate: Secondary | ICD-10-CM | POA: Diagnosis not present

## 2024-06-11 DIAGNOSIS — R221 Localized swelling, mass and lump, neck: Secondary | ICD-10-CM | POA: Diagnosis not present

## 2024-06-11 DIAGNOSIS — Z5986 Financial insecurity: Secondary | ICD-10-CM

## 2024-06-11 DIAGNOSIS — R2681 Unsteadiness on feet: Secondary | ICD-10-CM | POA: Diagnosis not present

## 2024-06-11 DIAGNOSIS — I6782 Cerebral ischemia: Secondary | ICD-10-CM | POA: Diagnosis not present

## 2024-06-11 DIAGNOSIS — I10 Essential (primary) hypertension: Secondary | ICD-10-CM | POA: Diagnosis present

## 2024-06-11 DIAGNOSIS — S72002D Fracture of unspecified part of neck of left femur, subsequent encounter for closed fracture with routine healing: Secondary | ICD-10-CM | POA: Diagnosis not present

## 2024-06-11 DIAGNOSIS — R220 Localized swelling, mass and lump, head: Secondary | ICD-10-CM | POA: Diagnosis not present

## 2024-06-11 DIAGNOSIS — I639 Cerebral infarction, unspecified: Secondary | ICD-10-CM | POA: Diagnosis not present

## 2024-06-11 DIAGNOSIS — S72001A Fracture of unspecified part of neck of right femur, initial encounter for closed fracture: Secondary | ICD-10-CM | POA: Diagnosis not present

## 2024-06-11 DIAGNOSIS — Z743 Need for continuous supervision: Secondary | ICD-10-CM | POA: Diagnosis not present

## 2024-06-11 DIAGNOSIS — W07XXXA Fall from chair, initial encounter: Secondary | ICD-10-CM | POA: Diagnosis present

## 2024-06-11 DIAGNOSIS — M47812 Spondylosis without myelopathy or radiculopathy, cervical region: Secondary | ICD-10-CM | POA: Diagnosis not present

## 2024-06-11 DIAGNOSIS — D649 Anemia, unspecified: Secondary | ICD-10-CM | POA: Diagnosis present

## 2024-06-11 DIAGNOSIS — N179 Acute kidney failure, unspecified: Secondary | ICD-10-CM | POA: Diagnosis present

## 2024-06-11 DIAGNOSIS — I252 Old myocardial infarction: Secondary | ICD-10-CM | POA: Diagnosis not present

## 2024-06-11 LAB — I-STAT CHEM 8, ED
BUN: 59 mg/dL — ABNORMAL HIGH (ref 8–23)
Calcium, Ion: 1.11 mmol/L — ABNORMAL LOW (ref 1.15–1.40)
Chloride: 112 mmol/L — ABNORMAL HIGH (ref 98–111)
Creatinine, Ser: 3.8 mg/dL — ABNORMAL HIGH (ref 0.61–1.24)
Glucose, Bld: 85 mg/dL (ref 70–99)
HCT: 25 % — ABNORMAL LOW (ref 39.0–52.0)
Hemoglobin: 8.5 g/dL — ABNORMAL LOW (ref 13.0–17.0)
Potassium: 5.5 mmol/L — ABNORMAL HIGH (ref 3.5–5.1)
Sodium: 145 mmol/L (ref 135–145)
TCO2: 22 mmol/L (ref 22–32)

## 2024-06-11 LAB — COMPREHENSIVE METABOLIC PANEL WITH GFR
ALT: 27 U/L (ref 0–44)
AST: 70 U/L — ABNORMAL HIGH (ref 15–41)
Albumin: 2.9 g/dL — ABNORMAL LOW (ref 3.5–5.0)
Alkaline Phosphatase: 47 U/L (ref 38–126)
Anion gap: 15 (ref 5–15)
BUN: 56 mg/dL — ABNORMAL HIGH (ref 8–23)
CO2: 21 mmol/L — ABNORMAL LOW (ref 22–32)
Calcium: 8.7 mg/dL — ABNORMAL LOW (ref 8.9–10.3)
Chloride: 108 mmol/L (ref 98–111)
Creatinine, Ser: 3.65 mg/dL — ABNORMAL HIGH (ref 0.61–1.24)
GFR, Estimated: 16 mL/min — ABNORMAL LOW (ref >60)
Glucose, Bld: 85 mg/dL (ref 70–99)
Potassium: 5.5 mmol/L — ABNORMAL HIGH (ref 3.5–5.1)
Sodium: 144 mmol/L (ref 135–145)
Total Bilirubin: 1.6 mg/dL — ABNORMAL HIGH (ref 0.0–1.2)
Total Protein: 7.7 g/dL (ref 6.5–8.1)

## 2024-06-11 LAB — PROTIME-INR
INR: 1.4 — ABNORMAL HIGH (ref 0.8–1.2)
Prothrombin Time: 17.5 s — ABNORMAL HIGH (ref 11.4–15.2)

## 2024-06-11 LAB — TYPE AND SCREEN
ABO/RH(D): A POS
Antibody Screen: NEGATIVE

## 2024-06-11 LAB — MAGNESIUM: Magnesium: 2.2 mg/dL (ref 1.7–2.4)

## 2024-06-11 LAB — CK: Total CK: 1667 U/L — ABNORMAL HIGH (ref 49–397)

## 2024-06-11 LAB — LIPASE, BLOOD: Lipase: 37 U/L (ref 11–51)

## 2024-06-11 LAB — TROPONIN I (HIGH SENSITIVITY): Troponin I (High Sensitivity): 108 ng/L (ref <18)

## 2024-06-11 MED ORDER — SODIUM CHLORIDE 0.9% IV SOLUTION
Freq: Once | INTRAVENOUS | Status: DC
Start: 2024-06-11 — End: 2024-06-15

## 2024-06-11 MED ORDER — PREDNISONE 20 MG PO TABS
60.0000 mg | ORAL_TABLET | Freq: Once | ORAL | Status: AC
  Administered 2024-06-11: 60 mg via ORAL
  Filled 2024-06-11: qty 3

## 2024-06-11 MED ORDER — ONDANSETRON HCL 4 MG/2ML IJ SOLN: 4.0000 mg | Freq: Four times a day (QID) | INTRAMUSCULAR | Status: DC | PRN

## 2024-06-11 MED ORDER — SODIUM CHLORIDE 0.9% IV SOLUTION
Freq: Once | INTRAVENOUS | Status: DC
Start: 2024-06-11 — End: 2024-06-16

## 2024-06-11 MED ORDER — ACETAMINOPHEN 650 MG RE SUPP: 650.0000 mg | Freq: Four times a day (QID) | RECTAL | Status: DC | PRN

## 2024-06-11 MED ORDER — HYDROCODONE-ACETAMINOPHEN 5-325 MG PO TABS
1.0000 | ORAL_TABLET | ORAL | Status: DC | PRN
  Administered 2024-06-12: 1 via ORAL
  Filled 2024-06-11 (×2): qty 1

## 2024-06-11 MED ORDER — SODIUM ZIRCONIUM CYCLOSILICATE 10 G PO PACK
10.0000 g | PACK | Freq: Two times a day (BID) | ORAL | Status: DC
Start: 2024-06-11 — End: 2024-06-13
  Administered 2024-06-11 – 2024-06-12 (×2): 10 g via ORAL
  Filled 2024-06-11 (×2): qty 1

## 2024-06-11 MED ORDER — ONDANSETRON HCL 4 MG PO TABS: 4.0000 mg | ORAL_TABLET | Freq: Four times a day (QID) | ORAL | Status: DC | PRN

## 2024-06-11 MED ORDER — SODIUM CHLORIDE 0.9 % IV BOLUS
1000.0000 mL | Freq: Once | INTRAVENOUS | Status: AC
  Administered 2024-06-11: 1000 mL via INTRAVENOUS

## 2024-06-11 MED ORDER — ACETAMINOPHEN 325 MG PO TABS: 650.0000 mg | ORAL_TABLET | Freq: Four times a day (QID) | ORAL | Status: DC | PRN

## 2024-06-11 MED ORDER — SODIUM CHLORIDE 0.9 % IV SOLN
Freq: Once | INTRAVENOUS | Status: DC
Start: 2024-06-11 — End: 2024-06-11

## 2024-06-11 MED ORDER — SODIUM CHLORIDE 0.9 % IV SOLN: INTRAVENOUS | Status: AC

## 2024-06-11 MED ORDER — POLYETHYLENE GLYCOL 3350 17 G PO PACK
17.0000 g | PACK | Freq: Two times a day (BID) | ORAL | Status: AC
Start: 2024-06-11 — End: 2024-06-13
  Administered 2024-06-11 – 2024-06-12 (×3): 17 g via ORAL
  Filled 2024-06-11 (×3): qty 1

## 2024-06-11 MED ORDER — SODIUM CHLORIDE 0.9 % IV SOLN: INTRAVENOUS | Status: DC

## 2024-06-11 NOTE — ED Notes (Signed)
 Adam, DO notified of critical lab.

## 2024-06-11 NOTE — Consult Note (Signed)
 Reason for Consult:Left hip fx Referring Physician: Juliene Chang Time called: 1436 Time at bedside: 1445   Michael Chang is an 78 y.o. male.  HPI: Michael Chang fell out of his recliner 2d ago. He had immediate left hip pain and could not get up. He lay there until today and was brought to the ED. X-rays showed a left hip fx and orthopedic surgery was consulted. He lives at home alone and does not use any assistive devices to ambulate.  Past Medical History:  Diagnosis Date   Anemia    Anemia in chronic kidney disease 03/02/2020   CAD (coronary artery disease)    Cataract (lens) fragments in eye following cataract surgery, right eye 03/15/2020   Chronic combined systolic (congestive) and diastolic (congestive) heart failure (HCC)    Chronic ulcer of right lower extremity (HCC)    CKD (chronic kidney disease), stage III (HCC)    Essential hypertension    HLD (hyperlipidemia)    Near syncope 02/14/2020   NSTEMI (non-ST elevated myocardial infarction) (HCC)    PVD (peripheral vascular disease) (HCC)     No past surgical history on file.  No family history on file.  Social History:  reports that he has never smoked. He has never used smokeless tobacco. He reports that he does not currently use alcohol. No history on file for drug use.  Allergies: No Known Allergies  Medications: I have reviewed the patient's current medications.  Results for orders placed or performed during the hospital encounter of 06/11/24 (from the past 48 hours)  I-stat chem 8, ED (not at [facility], [facility] or [facility])     Status: Abnormal   Collection Time: 06/11/24  2:20 PM  Result Value Ref Range   Sodium 145 135 - 145 mmol/L   Potassium 5.5 (H) 3.5 - 5.1 mmol/L   Chloride 112 (H) 98 - 111 mmol/L   BUN 59 (H) 8 - 23 mg/dL   Creatinine, Ser 6.19 (H) 0.61 - 1.24 mg/dL   Glucose, Bld 85 70 - 99 mg/dL    Comment: Glucose reference range applies only to samples taken after fasting for at least 8 hours.   Calcium, Ion 1.11  (L) 1.15 - 1.40 mmol/L   TCO2 22 22 - 32 mmol/L   Hemoglobin 8.5 (L) 13.0 - 17.0 g/dL   HCT 74.9 (L) 60.9 - 47.9 %    DG Hip Unilat With Pelvis 2-3 Views Left Result Date: 06/11/2024 CLINICAL DATA:  Pain after fall 2 days ago. EXAM: DG HIP (WITH OR WITHOUT PELVIS) 2-3V LEFT COMPARISON:  None Available. FINDINGS: Acute impacted and mildly displaced subcapital fracture of the left femoral head and neck junction. No dislocation. Moderate to advanced arthritic changes of the right hip with advanced joint space narrowing. Sacroiliac joints and pubic symphysis appear anatomically aligned. IMPRESSION: 1. Acute impacted and mildly displaced subcapital fracture of the left femoral head and neck junction. 2. Moderate to advanced arthritic changes of the right hip. Electronically Signed   By: Harrietta Sherry M.D.   On: 06/11/2024 14:26   DG Knee 1-2 Views Left Result Date: 06/11/2024 CLINICAL DATA:  Pain after fall two days ago. EXAM: LEFT KNEE - 1-2 VIEW COMPARISON:  None Available. FINDINGS: No evidence of acute fracture, dislocation, or joint effusion. Moderate medial femorotibial compartment predominant osteoarthritis. Patellar enthesopathy. Soft tissues are unremarkable. IMPRESSION: 1. No acute osseous abnormality. 2. Moderate medial femorotibial compartment predominant osteoarthritis. Electronically Signed   By: Harrietta Sherry M.D.   On: 06/11/2024 14:22  CT Head Wo Contrast Result Date: 06/11/2024 EXAM: CT HEAD WITHOUT CONTRAST 06/11/2024 01:45:49 PM TECHNIQUE: CT of the head was performed without the administration of intravenous contrast. Automated exposure control, iterative reconstruction, and/or weight based adjustment of the mA/kV was utilized to reduce the radiation dose to as low as reasonably achievable. COMPARISON: CT head 02/14/2020 CLINICAL HISTORY: Polytrauma, blunt. Pt BIB EMS with CC of left hip pain s/p GLF after possible near syncopal episode 2 days ago. Pt reported he has been unable  to get off the floor and spent two days crawling on floor before his sister found him today. AOx4 FINDINGS: BRAIN AND VENTRICLES: Nonspecific hypoattenuation in the periventricular and subcortical white matter, most likely representing chronic microvascular ischemic changes. Remote lacunar infarcts in the left basal ganglia and in the bilateral corona radiata. Encephalomalacia in the medial left occipital lobe suggestive of remote infarct. Additional small remote infarcts in the posterior left cerebellum. Mild parenchymal volume loss. No acute hemorrhage. No evidence of acute infarct. No hydrocephalus. No extra-axial collection. No mass effect or midline shift. ORBITS: Bilateral lens replacement. Elongation of the globes, particularly on the left, suggestive of axial myopia. No acute abnormality. SINUSES: Mucosal thickening in the frontal sinuses and ethmoid sinuses bilaterally. Additional mucosal thickening and possible mucous retention cysts in the inferior aspect of the maxillary sinuses. No acute abnormality. SOFT TISSUES AND SKULL: There is possible asymmetric soft tissue involving the right palatine tonsil and soft palate appreciated on series 2 image 2. No acute soft tissue abnormality. No skull fracture. IMPRESSION: 1. No acute intracranial abnormality. 2. Mild-to-moderate chronic microvascular ischemic changes. Mild parenchymal volume loss. 3. Multiple remote infarcts as above. 4. Asymmetric soft tissue involving the right palatine tonsil and soft palate, partially visualized. Recommend correlation with direct visualization and consider CT neck soft tissue with contrast for further evaluation. Electronically signed by: Donnice Mania MD 06/11/2024 02:14 PM EDT RP Workstation: [id number]   CT Cervical Spine Wo Contrast Result Date: 06/11/2024 CLINICAL DATA:  Polytrauma, blunt Near syncopal episode 2 days ago.  Found down. EXAM: CT CERVICAL SPINE WITHOUT CONTRAST TECHNIQUE: Multidetector CT imaging of the  cervical spine was performed without intravenous contrast. Multiplanar CT image reconstructions were also generated. RADIATION DOSE REDUCTION: This exam was performed according to the departmental dose-optimization program which includes automated exposure control, adjustment of the mA and/or kV according to patient size and/or use of iterative reconstruction technique. COMPARISON:  None Available. FINDINGS: Alignment: Normal. Skull base and vertebrae: No evidence of acute cervical spine fracture or traumatic subluxation. Soft tissues and spinal canal: No prevertebral fluid or swelling. No visible canal hematoma. Disc levels: Multilevel relatively mild spondylosis with disc space narrowing and uncinate spurring from C3-4 through C6-7. No evidence of large disc herniation or high-grade spinal stenosis. No more than mild foraminal narrowing identified. Upper chest: Clear lung apices. Other: Probable small mucous retention cysts in the maxillary sinuses bilaterally. Prominent mylohyoid ossification, left greater than right. IMPRESSION: 1. No evidence of acute cervical spine fracture, traumatic subluxation or static signs of instability. 2. Mild cervical spondylosis. Electronically Signed   By: Elsie Perone M.D.   On: 06/11/2024 14:04    Review of Systems  HENT:  Negative for ear discharge, ear pain, hearing loss and tinnitus.   Eyes:  Negative for photophobia and pain.  Respiratory:  Negative for cough and shortness of breath.   Cardiovascular:  Negative for chest pain.  Gastrointestinal:  Negative for abdominal pain, nausea and vomiting.  Genitourinary:  Negative  for dysuria, flank pain, frequency and urgency.  Musculoskeletal:  Positive for arthralgias (Left hip). Negative for back pain, myalgias and neck pain.  Neurological:  Negative for dizziness and headaches.  Hematological:  Does not bruise/bleed easily.  Psychiatric/Behavioral:  The patient is not nervous/anxious.    Blood pressure (!)  177/104, pulse 94, resp. rate (!) 23, height 6' 1 (1.854 m), weight 65 kg, SpO2 100%. Physical Exam Constitutional:      General: He is not in acute distress.    Appearance: He is well-developed. He is not diaphoretic.  HENT:     Head: Normocephalic and atraumatic.  Eyes:     General: No scleral icterus.       Right eye: No discharge.        Left eye: No discharge.     Conjunctiva/sclera: Conjunctivae normal.  Cardiovascular:     Rate and Rhythm: Normal rate and regular rhythm.  Pulmonary:     Effort: Pulmonary effort is normal. No respiratory distress.  Musculoskeletal:     Cervical back: Normal range of motion.     Comments: LLE No traumatic wounds, ecchymosis, or rash  Mild TTP hip  No knee or ankle effusion  Knee stable to varus/ valgus and anterior/posterior stress  Sens DPN, SPN, TN intact  Motor EHL, ext, flex, evers 5/5  DP 2+, PT 1+, No significant edema  Skin:    General: Skin is warm and dry.  Neurological:     Mental Status: He is alert.  Psychiatric:        Mood and Affect: Mood normal.        Behavior: Behavior normal.     Assessment/Plan: Left hip fx -- Tentatively plan THA tomorrow with Dr. Kendal. Please keep NPO after MN.    Ozell DOROTHA Ned, PA-C Orthopedic Surgery [phone] 06/11/2024, 2:53 PM

## 2024-06-11 NOTE — ED Notes (Signed)
 Sister [name] Arey asked to be contacted for pt updates. Phone number in chart confirmed.

## 2024-06-11 NOTE — ED Notes (Signed)
 Paged MD for clarification on holding second bag of platelets for CBC draw.

## 2024-06-11 NOTE — ED Provider Notes (Addendum)
 [place] EMERGENCY DEPARTMENT AT [facility] Provider Note   CSN: [id number] Arrival date & time: 06/11/24  1243     Patient presents with: Hip Pain and Near Syncope   Michael Chang is a 78 y.o. male.   Patient here after what sounds like syncopal event.  He was sitting on his couch got lightheaded dizzy next and he knew he was on the floor next to his recliner that also had tipped over.  He is having pain in the left hip.  States this happened on Tuesday afternoon 2 days ago.  He was too weak to get himself up off the floor until his sister came and saw him today and found him on the floor.  He has not had anything to eat or drink in the last 2 days.  He has just been trying to call around the apartment.  He was unable to get to a phone.  He denies any chest pain shortness of breath fever chills.  No nausea vomiting diarrhea.  Pain in the left knee left hip.  He does not know if he hit his head.  He is not on any blood thinners.  The history is provided by the patient.       Prior to Admission medications   Medication Sig Start Date End Date Taking? Authorizing Provider  amLODipine  (NORVASC ) 10 MG tablet Take 1 tablet (10 mg total) by mouth daily. 02/25/24   Darron Deatrice LABOR, MD  aspirin  81 MG EC tablet Take by mouth. 01/05/20   [provider]  carvedilol  (COREG ) 25 MG tablet TAKE ONE-HALF TABLET BY MOUTH IN THE MORNING AND 1 TABLET BY  MOUTH IN THE EVENING 02/25/24   Darron Deatrice LABOR, MD  ferrous sulfate  325 (65 FE) MG tablet Take 325 mg by mouth daily with breakfast.    [provider]  hydrALAZINE  (APRESOLINE ) 100 MG tablet TAKE 1 TABLET BY MOUTH TWICE  DAILY 05/13/24   Darron Deatrice LABOR, MD  isosorbide  mononitrate (IMDUR ) 30 MG 24 hr tablet TAKE 2 TABLETS BY MOUTH IN THE  MORNING AND TAKE ONE-HALF TABLET BY MOUTH IN THE EVENING 04/06/24   Darron Deatrice LABOR, MD  Multiple Vitamins-Minerals (CENTRUM SILVER 50+MEN) TABS Take by mouth.    [provider]    Allergies: Patient has no known allergies.    Review of Systems  Updated Vital Signs BP (!) 177/104   Pulse 94   Resp (!) 23   Ht 6' 1 (1.854 m)   Wt 65 kg   SpO2 100%   BMI 18.91 kg/m   Physical Exam Vitals and nursing note reviewed.  Constitutional:      General: He is not in acute distress.    Appearance: He is well-developed. He is not ill-appearing.  HENT:     Head: Normocephalic and atraumatic.     Mouth/Throat:     Mouth: Mucous membranes are dry.  Eyes:     Extraocular Movements: Extraocular movements intact.     Conjunctiva/sclera: Conjunctivae normal.     Pupils: Pupils are equal, round, and reactive to light.  Cardiovascular:     Rate and Rhythm: Normal rate and regular rhythm.     Heart sounds: No murmur heard. Pulmonary:     Effort: Pulmonary effort is normal. No respiratory distress.     Breath sounds: Normal breath sounds.  Abdominal:     Palpations: Abdomen is soft.     Tenderness: There is no abdominal tenderness.  Musculoskeletal:  General: Tenderness present. No swelling.     Cervical back: Normal range of motion and neck supple.     Comments: Tenderness to the left knee left hip, no midline spinal tenderness  Skin:    General: Skin is warm and dry.     Capillary Refill: Capillary refill takes less than 2 seconds.  Neurological:     General: No focal deficit present.     Mental Status: He is alert and oriented to person, place, and time.     Cranial Nerves: No cranial nerve deficit.     Sensory: No sensory deficit.     Motor: No weakness.     Coordination: Coordination normal.     Comments: Normal strength and sensation throughout bilateral but globally weak in his arms and legs  Psychiatric:        Mood and Affect: Mood normal.     (all labs ordered are listed, but only abnormal results are displayed) Labs Reviewed  CBC WITH DIFFERENTIAL/PLATELET - Abnormal; Notable for the following components:      Result Value   RBC  2.97 (*)    Hemoglobin 8.9 (*)    HCT 27.6 (*)    Platelets 5 (*)    All other components within normal limits  COMPREHENSIVE METABOLIC PANEL WITH GFR - Abnormal; Notable for the following components:   Potassium 5.5 (*)    CO2 21 (*)    BUN 56 (*)    Creatinine, Ser 3.65 (*)    Calcium 8.7 (*)    Albumin 2.9 (*)    AST 70 (*)    Total Bilirubin 1.6 (*)    GFR, Estimated 16 (*)    All other components within normal limits  CK - Abnormal; Notable for the following components:   Total CK 1,667 (*)    All other components within normal limits  I-STAT CHEM 8, ED - Abnormal; Notable for the following components:   Potassium 5.5 (*)    Chloride 112 (*)    BUN 59 (*)    Creatinine, Ser 3.80 (*)    Calcium, Ion 1.11 (*)    Hemoglobin 8.5 (*)    HCT 25.0 (*)    All other components within normal limits  TROPONIN I (HIGH SENSITIVITY) - Abnormal; Notable for the following components:   Troponin I (High Sensitivity) 108 (*)    All other components within normal limits  LIPASE, BLOOD  URINALYSIS, ROUTINE W REFLEX MICROSCOPIC  PROTIME-INR  TECHNOLOGIST SMEAR REVIEW  TYPE AND SCREEN  PREPARE PLATELET PHERESIS  PREPARE PLATELET PHERESIS  TROPONIN I (HIGH SENSITIVITY)    EKG: EKG Interpretation Date/Time:  Thursday June 11 2024 12:52:26 EDT Ventricular Rate:  82 PR Interval:  156 QRS Duration:  82 QT Interval:  413 QTC Calculation: 483 R Axis:   86  Text Interpretation: Sinus rhythm Borderline right axis deviation Anteroseptal infarct, old Confirmed by Ruthe Cornet [phone]) on 06/11/2024 1:09:12 PM  Radiology: CT Soft Tissue Neck Wo Contrast Result Date: 06/11/2024 EXAM: CT NECK WITHOUT CONTRAST 06/11/2024 03:09:19 PM TECHNIQUE: CT of the neck was performed without the administration of intravenous contrast. Multiplanar reformatted images are provided for review. Automated exposure control, iterative reconstruction, and/or weight based adjustment of the mA/kV was utilized  to reduce the radiation dose to as low as reasonably achievable. COMPARISON: None available. CLINICAL HISTORY: Soft tissue mass of right tonsil. FINDINGS: AERODIGESTIVE TRACT: Soft tissue mass anchored along the right side of the oropharynx measures 3.9 x 3.3 x 3.8  cm. Lesion extends into the glosso tonsillar sulcus. The left palatine tonsil was unremarkable. SALIVARY GLANDS: The left submandibular gland is unremarkable. Asymmetric right submandibular soft tissue likely reflects enlarged nodes. THYROID : Unremarkable. LYMPH NODES: No significant lower adenopathy is present. SOFT TISSUES: No mass or fluid collection. BRAIN, ORBITS, SINUSES AND MASTOIDS: No acute abnormality. LUNGS AND MEDIASTINUM: No acute abnormality. BONES: No focal bone abnormality. IMPRESSION: 1. Soft tissue mass anchored along the right side of the oropharynx, measuring 3.9 x 3.3 x 3.8 cm, extending into the glossotonsillar sulcus. 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 Asymmetric right submandibular soft tissue, likely reflecting enlarged nodes and worrisome for metastatic disease . Electronically signed by: Lonni Necessary MD 06/11/2024 03:25 PM EDT RP Workstation: [id number]   DG Hip Unilat With Pelvis 2-3 Views Left Result Date: 06/11/2024 CLINICAL DATA:  Pain after fall 2 days ago. EXAM: DG HIP (WITH OR WITHOUT PELVIS) 2-3V LEFT COMPARISON:  None Available. FINDINGS: Acute impacted and mildly displaced subcapital fracture of the left femoral head and neck junction. No dislocation. Moderate to advanced arthritic changes of the right hip with advanced joint space narrowing. Sacroiliac joints and pubic symphysis appear anatomically aligned. IMPRESSION: 1. Acute impacted and mildly displaced subcapital fracture of the left femoral head and neck junction. 2. Moderate to advanced arthritic changes of the right hip. Electronically Signed   By: Harrietta Sherry M.D.   On: 06/11/2024 14:26   DG Knee 1-2 Views Left Result  Date: 06/11/2024 CLINICAL DATA:  Pain after fall two days ago. EXAM: LEFT KNEE - 1-2 VIEW COMPARISON:  None Available. FINDINGS: No evidence of acute fracture, dislocation, or joint effusion. Moderate medial femorotibial compartment predominant osteoarthritis. Patellar enthesopathy. Soft tissues are unremarkable. IMPRESSION: 1. No acute osseous abnormality. 2. Moderate medial femorotibial compartment predominant osteoarthritis. Electronically Signed   By: Harrietta Sherry M.D.   On: 06/11/2024 14:22   CT Head Wo Contrast Result Date: 06/11/2024 EXAM: CT HEAD WITHOUT CONTRAST 06/11/2024 01:45:49 PM TECHNIQUE: CT of the head was performed without the administration of intravenous contrast. Automated exposure control, iterative reconstruction, and/or weight based adjustment of the mA/kV was utilized to reduce the radiation dose to as low as reasonably achievable. COMPARISON: CT head 02/14/2020 CLINICAL HISTORY: Polytrauma, blunt. Pt BIB EMS with CC of left hip pain s/p GLF after possible near syncopal episode 2 days ago. Pt reported he has been unable to get off the floor and spent two days crawling on floor before his sister found him today. AOx4 FINDINGS: BRAIN AND VENTRICLES: Nonspecific hypoattenuation in the periventricular and subcortical white matter, most likely representing chronic microvascular ischemic changes. Remote lacunar infarcts in the left basal ganglia and in the bilateral corona radiata. Encephalomalacia in the medial left occipital lobe suggestive of remote infarct. Additional small remote infarcts in the posterior left cerebellum. Mild parenchymal volume loss. No acute hemorrhage. No evidence of acute infarct. No hydrocephalus. No extra-axial collection. No mass effect or midline shift. ORBITS: Bilateral lens replacement. Elongation of the globes, particularly on the left, suggestive of axial myopia. No acute abnormality. SINUSES: Mucosal thickening in the frontal sinuses and ethmoid sinuses  bilaterally. Additional mucosal thickening and possible mucous retention cysts in the inferior aspect of the maxillary sinuses. No acute abnormality. SOFT TISSUES AND SKULL: There is possible asymmetric soft tissue involving the right palatine tonsil and soft palate appreciated on series 2 image 2. No acute soft tissue abnormality. No skull fracture. IMPRESSION: 1. No acute intracranial abnormality. 2. Mild-to-moderate chronic microvascular  ischemic changes. Mild parenchymal volume loss. 3. Multiple remote infarcts as above. 4. Asymmetric soft tissue involving the right palatine tonsil and soft palate, partially visualized. Recommend correlation with direct visualization and consider CT neck soft tissue with contrast for further evaluation. Electronically signed by: Donnice Mania MD 06/11/2024 02:14 PM EDT RP Workstation: [id number]   CT Cervical Spine Wo Contrast Result Date: 06/11/2024 CLINICAL DATA:  Polytrauma, blunt Near syncopal episode 2 days ago.  Found down. EXAM: CT CERVICAL SPINE WITHOUT CONTRAST TECHNIQUE: Multidetector CT imaging of the cervical spine was performed without intravenous contrast. Multiplanar CT image reconstructions were also generated. RADIATION DOSE REDUCTION: This exam was performed according to the departmental dose-optimization program which includes automated exposure control, adjustment of the mA and/or kV according to patient size and/or use of iterative reconstruction technique. COMPARISON:  None Available. FINDINGS: Alignment: Normal. Skull base and vertebrae: No evidence of acute cervical spine fracture or traumatic subluxation. Soft tissues and spinal canal: No prevertebral fluid or swelling. No visible canal hematoma. Disc levels: Multilevel relatively mild spondylosis with disc space narrowing and uncinate spurring from C3-4 through C6-7. No evidence of large disc herniation or high-grade spinal stenosis. No more than mild foraminal narrowing identified. Upper chest: Clear  lung apices. Other: Probable small mucous retention cysts in the maxillary sinuses bilaterally. Prominent mylohyoid ossification, left greater than right. IMPRESSION: 1. No evidence of acute cervical spine fracture, traumatic subluxation or static signs of instability. 2. Mild cervical spondylosis. Electronically Signed   By: Elsie Perone M.D.   On: 06/11/2024 14:04     .Critical Care  Performed by: Ruthe Cornet, DO Authorized by: Ruthe Cornet, DO   Critical care provider statement:    Critical care time (minutes):  35   Critical care was necessary to treat or prevent imminent or life-threatening deterioration of the following conditions: Thrombocytopenia.   Critical care was time spent personally by me on the following activities:  Blood draw for specimens, development of treatment plan with patient or surrogate, discussions with consultants, discussions with primary provider, evaluation of patient's response to treatment, examination of patient, obtaining history from patient or surrogate, ordering and performing treatments and interventions, ordering and review of laboratory studies, ordering and review of radiographic studies, pulse oximetry, re-evaluation of patient's condition and review of old charts   Care discussed with: admitting provider      Medications Ordered in the ED  sodium chloride  0.9 % bolus 1,000 mL (has no administration in time range)  0.9 %  sodium chloride  infusion (has no administration in time range)  sodium zirconium cyclosilicate  (LOKELMA ) packet 10 g (has no administration in time range)  0.9 %  sodium chloride  infusion (Manually program via Guardrails IV Fluids) (has no administration in time range)  0.9 %  sodium chloride  infusion (has no administration in time range)  predniSONE  (DELTASONE ) tablet 60 mg (has no administration in time range)  0.9 %  sodium chloride  infusion (Manually program via Guardrails IV Fluids) (has no administration in time range)                                     Medical Decision Making Amount and/or Complexity of Data Reviewed Labs: ordered. Radiology: ordered.  Risk Prescription drug management. Decision regarding hospitalization.   Michael Chang is here after questionable syncopal event 2 days ago.  He got lightheaded dizzy fell off of his recliner ended up on the  floor recliner tipped over.  Having pain in his left hip.  He was able to get up off the ground and he spent the last 2 days on the floor sister found him today.  He still having left hip pain left knee pain.  He is pretty confident he did not hit his head but he does think he lost consciousness.  He is not on any blood thinners.  He is tender in his left hip he is tender in his left knee.  He looks dry on exam.  He is not having any chest pain.  He has history of peripheral vascular disease anemia hypertension CAD high cholesterol.  Ultimately differential diagnosis could have been arrhythmia, less likely vasovagal event.  Will get head CT neck CT x-ray of his left hip left knee give IV fluids check basic labs urinalysis CK troponin.  Could be rhabdomyolysis given that has been on the floor and on the last couple days.  I do think he is physically decompensated which sounds like at baseline he somewhat is as well.  He will likely need admission for syncope workup supportive care.  Will look for any infectious process as well.  He seems to be mentating well however.  He does not usually use any assistive devices.  Does have a walker at home.  He is normally able to ambulate without any assistance.  Overall CT of the head and neck were unremarkable but did show concern for may be a soft tissue mass in his right tonsil area.  On exam I clinically looked and he does have a fairly large looking soft tissue mass in the right lower portion of his mouth.  When I talked to him about this maybe for the last year or so he has noticed a change in his voice and  difficulty swallowing.  I am concerned that this is a cancerous process.  Unfortunately has an AKI with a creatinine up to 3.8.  We got a CT scan without contrast that confirms a soft tissue mass in the lower right portion of his mouth most likely a squamous cell carcinoma.  He has a platelet count of 5, hemoglobin 8.9.  I do think that this is likely from oncology process.  Lab work was otherwise significant for elevated troponin at 110 which I think is likely from demand.  CK was 1600.  Overall dehydration secondary to poor p.o. intake given that he was on the floor for the last 2 days.  He also was found to have a left hip fracture per my review and interpretation of x-ray.  Orthopedics has been consulted with [facility] for that.  I talked with hospitalist for admission.  I have talked with Dr. Conchetta with oncology as well who recommends a unit of platelets and starting 60 mg of prednisone  daily.  I have a page out to ENT Dr. Carlie to make them aware as well.  Not sure if this mass will cause any issues for intubation for surgery as well as likely needs a biopsy of this area.  Patient to be admitted to the medicine service for further care.  Dr. Carlie with ENT recommends outpatient follow-up with him but he can be contacted if needed otherwise.  This chart was dictated using voice recognition software.  Despite best efforts to proofread,  errors can occur which can change the documentation meaning.      Final diagnoses:  Syncope and collapse  Mass of mouth  Closed fracture of  left hip, initial encounter [facility])  Thrombocytopenia [facility])    ED Discharge Orders     None          Ruthe Cornet, DO 06/11/24 1623    Ruthe Cornet, DO 06/11/24 [phone]

## 2024-06-11 NOTE — H&P (Signed)
 History and Physical  Tandre Conly FMW:[id number] DOB: 02-01-1946 DOA: 06/11/2024  PCP: Gretta Comer POUR, NP Patient coming from: Home  I have personally briefly reviewed patient's old medical records in [facility]   Chief Complaint: home  HPI: Adaiah Morken is a 78 y.o. male past medical history of essential hypertension, chronic diastolic dysfunction, history of hepatitis C, with a remote history of heroin use many years ago, chronic kidney disease stage IIIb, coronary artery disease comes into the hospital after fall that happened 2 days prior to admission.  He relates he stopped taking his blood pressure medication about 2 weeks ago as he was feeling dizzy all the time the dizziness went away.  He does not know why he started taking back his blood pressure medication the day of the fall he was sitting down he denies any loss of consciousness but he started feeling dizzy and felt from the sitting position to the left side after he fell he could not get up and spent 2 days crawling around the floor in his house, and due to the pain he called EMS was he was able to get to the phone.  He denies any loss of consciousness no change in vision and only dizziness right before the fall. He relates he is also been getting short of breath for the last several months and he has had some dysp of his voice.  And some difficulty swallowing  In the ED: He was found to be hyperkalemic at 5.5, creatinine of 3.8 (with a baseline creatinine around 1.7-2), hemoglobin of 8 (with a baseline hemoglobin like around 12), platelet count of 5, CK of 16,000 troponins are flat at 100.  Hip x-ray showed acute impacted mildly displaced subcapital left femoral fracture, CT of soft tissue head and neck showed a mass anchored around the right side of the oropharynx measuring 3.9 x 3 x 3.8 EXTR in the into the glossotonsillar sulcus and asymmetric submandibular soft tissue worrisome for metastatic nodes   Review of  Systems: All systems reviewed and apart from history of presenting illness, are negative.  Past Medical History:  Diagnosis Date   Anemia    Anemia in chronic kidney disease 03/02/2020   CAD (coronary artery disease)    Cataract (lens) fragments in eye following cataract surgery, right eye 03/15/2020   Chronic combined systolic (congestive) and diastolic (congestive) heart failure (HCC)    Chronic ulcer of right lower extremity (HCC)    CKD (chronic kidney disease), stage III (HCC)    Essential hypertension    HLD (hyperlipidemia)    Near syncope 02/14/2020   NSTEMI (non-ST elevated myocardial infarction) (HCC)    PVD (peripheral vascular disease) (HCC)    No past surgical history on file. Social History:  reports that he has never smoked. He has never used smokeless tobacco. He reports that he does not currently use alcohol. He reports that he does not currently use drugs after having used the following drugs: Heroin and Cocaine.   No Known Allergies  Family History  Problem Relation Age of Onset   Dementia Mother    Diabetes Mellitus II Father     Prior to Admission medications   Medication Sig Start Date End Date Taking? Authorizing Provider  amLODipine  (NORVASC ) 10 MG tablet Take 1 tablet (10 mg total) by mouth daily. 02/25/24   Darron Deatrice LABOR, MD  aspirin  81 MG EC tablet Take by mouth. 01/05/20   [provider]  carvedilol  (COREG ) 25  MG tablet TAKE ONE-HALF TABLET BY MOUTH IN THE MORNING AND 1 TABLET BY  MOUTH IN THE EVENING 02/25/24   Darron Deatrice LABOR, MD  ferrous sulfate  325 (65 FE) MG tablet Take 325 mg by mouth daily with breakfast.    [provider]  hydrALAZINE  (APRESOLINE ) 100 MG tablet TAKE 1 TABLET BY MOUTH TWICE  DAILY 05/13/24   Darron Deatrice LABOR, MD  isosorbide  mononitrate (IMDUR ) 30 MG 24 hr tablet TAKE 2 TABLETS BY MOUTH IN THE  MORNING AND TAKE ONE-HALF TABLET BY MOUTH IN THE EVENING 04/06/24   Darron Deatrice LABOR, MD  Multiple Vitamins-Minerals  (CENTRUM SILVER 50+MEN) TABS Take by mouth.    [provider]   Physical Exam: Vitals:   06/11/24 1305 06/11/24 1306 06/11/24 1430  BP: (!) 165/99  (!) 177/104  Pulse: 80  94  Resp: 18  (!) 23  SpO2: 100%  100%  Weight:  65 kg   Height:  6' 1 (1.854 m)     General exam: Moderately built and nourished patient, cachectic appearing Head, eyes and ENT: Nontraumatic and normocephalic.  Neck: Supple. No JVD, carotid bruit or thyromegaly. Lymphatics: No lymphadenopathy. Respiratory system: Clear to auscultation. No increased work of breathing. Cardiovascular system: S1 and S2 heard, RRR. No JVD. Gastrointestinal system: Abdomen is nondistended, soft and nontender.  Central nervous system: Alert and oriented. No focal neurological deficits. Extremities: Symmetric 5 x 5 power. Peripheral pulses symmetrically felt.  Skin: No rashes or acute findings. Musculoskeletal system: Negative exam. Psychiatry: Pleasant and cooperative.   Labs on Admission:  Basic Metabolic Panel: Recent Labs  Lab 06/11/24 1413 06/11/24 1420  NA 144 145  K 5.5* 5.5*  CL 108 112*  CO2 21*  --   GLUCOSE 85 85  BUN 56* 59*  CREATININE 3.65* 3.80*  CALCIUM 8.7*  --    Liver Function Tests: Recent Labs  Lab 06/11/24 1413  AST 70*  ALT 27  ALKPHOS 47  BILITOT 1.6*  PROT 7.7  ALBUMIN 2.9*   Recent Labs  Lab 06/11/24 1413  LIPASE 37   No results for input(s): AMMONIA in the last 168 hours. CBC: Recent Labs  Lab 06/11/24 1413 06/11/24 1420  WBC 7.5  --   NEUTROABS 6.0  --   HGB 8.9* 8.5*  HCT 27.6* 25.0*  MCV 92.9  --   PLT 5*  --    Cardiac Enzymes: Recent Labs  Lab 06/11/24 1413  CKTOTAL 1,667*    BNP (last 3 results) No results for input(s): PROBNP in the last 8760 hours. CBG: No results for input(s): GLUCAP in the last 168 hours.  Radiological Exams on Admission: CT Soft Tissue Neck Wo Contrast Result Date: 06/11/2024 EXAM: CT NECK WITHOUT CONTRAST  06/11/2024 03:09:19 PM TECHNIQUE: CT of the neck was performed without the administration of intravenous contrast. Multiplanar reformatted images are provided for review. Automated exposure control, iterative reconstruction, and/or weight based adjustment of the mA/kV was utilized to reduce the radiation dose to as low as reasonably achievable. COMPARISON: None available. CLINICAL HISTORY: Soft tissue mass of right tonsil. FINDINGS: AERODIGESTIVE TRACT: Soft tissue mass anchored along the right side of the oropharynx measures 3.9 x 3.3 x 3.8 cm. Lesion extends into the glosso tonsillar sulcus. The left palatine tonsil was unremarkable. SALIVARY GLANDS: The left submandibular gland is unremarkable. Asymmetric right submandibular soft tissue likely reflects enlarged nodes. THYROID : Unremarkable. LYMPH NODES: No significant lower adenopathy is present. SOFT TISSUES: No mass or fluid collection. BRAIN, ORBITS,  SINUSES AND MASTOIDS: No acute abnormality. LUNGS AND MEDIASTINUM: No acute abnormality. BONES: No focal bone abnormality. IMPRESSION: 1. Soft tissue mass anchored along the right side of the oropharynx, measuring 3.9 x 3.3 x 3.8 cm, extending into the glossotonsillar sulcus. 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 Asymmetric right submandibular soft tissue, likely reflecting enlarged nodes and worrisome for metastatic disease . Electronically signed by: Lonni Necessary MD 06/11/2024 03:25 PM EDT RP Workstation: [id number]   DG Hip Unilat With Pelvis 2-3 Views Left Result Date: 06/11/2024 CLINICAL DATA:  Pain after fall 2 days ago. EXAM: DG HIP (WITH OR WITHOUT PELVIS) 2-3V LEFT COMPARISON:  None Available. FINDINGS: Acute impacted and mildly displaced subcapital fracture of the left femoral head and neck junction. No dislocation. Moderate to advanced arthritic changes of the right hip with advanced joint space narrowing. Sacroiliac joints and pubic symphysis appear anatomically aligned.  IMPRESSION: 1. Acute impacted and mildly displaced subcapital fracture of the left femoral head and neck junction. 2. Moderate to advanced arthritic changes of the right hip. Electronically Signed   By: Harrietta Sherry M.D.   On: 06/11/2024 14:26   DG Knee 1-2 Views Left Result Date: 06/11/2024 CLINICAL DATA:  Pain after fall two days ago. EXAM: LEFT KNEE - 1-2 VIEW COMPARISON:  None Available. FINDINGS: No evidence of acute fracture, dislocation, or joint effusion. Moderate medial femorotibial compartment predominant osteoarthritis. Patellar enthesopathy. Soft tissues are unremarkable. IMPRESSION: 1. No acute osseous abnormality. 2. Moderate medial femorotibial compartment predominant osteoarthritis. Electronically Signed   By: Harrietta Sherry M.D.   On: 06/11/2024 14:22   CT Head Wo Contrast Result Date: 06/11/2024 EXAM: CT HEAD WITHOUT CONTRAST 06/11/2024 01:45:49 PM TECHNIQUE: CT of the head was performed without the administration of intravenous contrast. Automated exposure control, iterative reconstruction, and/or weight based adjustment of the mA/kV was utilized to reduce the radiation dose to as low as reasonably achievable. COMPARISON: CT head 02/14/2020 CLINICAL HISTORY: Polytrauma, blunt. Pt BIB EMS with CC of left hip pain s/p GLF after possible near syncopal episode 2 days ago. Pt reported he has been unable to get off the floor and spent two days crawling on floor before his sister found him today. AOx4 FINDINGS: BRAIN AND VENTRICLES: Nonspecific hypoattenuation in the periventricular and subcortical white matter, most likely representing chronic microvascular ischemic changes. Remote lacunar infarcts in the left basal ganglia and in the bilateral corona radiata. Encephalomalacia in the medial left occipital lobe suggestive of remote infarct. Additional small remote infarcts in the posterior left cerebellum. Mild parenchymal volume loss. No acute hemorrhage. No evidence of acute infarct. No  hydrocephalus. No extra-axial collection. No mass effect or midline shift. ORBITS: Bilateral lens replacement. Elongation of the globes, particularly on the left, suggestive of axial myopia. No acute abnormality. SINUSES: Mucosal thickening in the frontal sinuses and ethmoid sinuses bilaterally. Additional mucosal thickening and possible mucous retention cysts in the inferior aspect of the maxillary sinuses. No acute abnormality. SOFT TISSUES AND SKULL: There is possible asymmetric soft tissue involving the right palatine tonsil and soft palate appreciated on series 2 image 2. No acute soft tissue abnormality. No skull fracture. IMPRESSION: 1. No acute intracranial abnormality. 2. Mild-to-moderate chronic microvascular ischemic changes. Mild parenchymal volume loss. 3. Multiple remote infarcts as above. 4. Asymmetric soft tissue involving the right palatine tonsil and soft palate, partially visualized. Recommend correlation with direct visualization and consider CT neck soft tissue with contrast for further evaluation. Electronically signed by: Donnice Mania MD 06/11/2024 02:14  PM EDT RP Workstation: [id number]   CT Cervical Spine Wo Contrast Result Date: 06/11/2024 CLINICAL DATA:  Polytrauma, blunt Near syncopal episode 2 days ago.  Found down. EXAM: CT CERVICAL SPINE WITHOUT CONTRAST TECHNIQUE: Multidetector CT imaging of the cervical spine was performed without intravenous contrast. Multiplanar CT image reconstructions were also generated. RADIATION DOSE REDUCTION: This exam was performed according to the departmental dose-optimization program which includes automated exposure control, adjustment of the mA and/or kV according to patient size and/or use of iterative reconstruction technique. COMPARISON:  None Available. FINDINGS: Alignment: Normal. Skull base and vertebrae: No evidence of acute cervical spine fracture or traumatic subluxation. Soft tissues and spinal canal: No prevertebral fluid or swelling. No  visible canal hematoma. Disc levels: Multilevel relatively mild spondylosis with disc space narrowing and uncinate spurring from C3-4 through C6-7. No evidence of large disc herniation or high-grade spinal stenosis. No more than mild foraminal narrowing identified. Upper chest: Clear lung apices. Other: Probable small mucous retention cysts in the maxillary sinuses bilaterally. Prominent mylohyoid ossification, left greater than right. IMPRESSION: 1. No evidence of acute cervical spine fracture, traumatic subluxation or static signs of instability. 2. Mild cervical spondylosis. Electronically Signed   By: Elsie Perone M.D.   On: 06/11/2024 14:04    EKG: Independently reviewed.  Sinus tach normal axis nonspecific T wave changes  Assessment/Plan Closed left hip fracture [facility] Orthopedic surgery was consulted.  But the patient will not be able to have surgery tmrw as he is hyperkalemic, with platelet count of 5k and he is in acute renal failure. I will discuss with surgery and will allow him diet. Continue narcotics for pain, start him on a bowel regimen daily. Check PT and INR  Severe thrombocytopenia: Discussed with hematology they are concerned about ITP's, he denies any viral infections, has no petechiae or joint pain besides his hip fracture. He does have a mass on CT of the neck which I am concerned about squamous cell carcinoma of his neck.  Which would make you think of infiltrative disease to the bone marrow concerning.. Will go ahead and transfusing 2 units of platelets.  Check a platelet count posttransfusion Also in the differential will be consumption in the setting of bone marrow infiltration leading to your thrombocytopenia. Discussed with hematology oncology recommended to start steroids and transfusion of platelet.  Preop evaluation: The patient is currently at high risk of bleeding and arrhythmias, his potassium is 5.5 with new acute kidney injury. Check a PT and INR He also  has a platelet count of 5000, see below for further details. Have discussed the risk benefits with the patient and he understands and agrees that we will have to correct his hyperkalemia, new acute kidney injury and platelet count before proceeding with surgery.  Hyperkalemia: Start him on IV fluids give Lokelma  and recheck basic metabolic panel in the morning.  Acute kidney injury on chronic kidney see stage IIIb: He weighed he was taking his antihypertensive medication which was making him dizzy. Also his CK is 1600. He has had a significant weight loss and looks kind of cachectic, and he also has been relating for the last several weeks that his blood pressure medication he thinks is making him lightheaded. I believe there is a component of hemodynamically mediated due to his antihypertensive medication in the setting of decreased oral intake. Start him on aggressive IV fluids recheck a basic metabolic panel in the morning.  Mild traumatic rhabdomyolysis: From spending few days on the floor,  we will give him enough aggressive fluid, recheck a CK in the morning.  Essential hypertension Will hold all antihypertensive medication. Start him on IV fluids along with diet.  Normocytic anemia Normally his hemoglobin runs like around 12, on admission is 8, I am concerned either of infiltrative disease or he is bleeding into his hip due to his severe thrombocytopenia. Will start him on aggressive IV fluid hydration recheck a CBC in the morning, type and screen in case he needs transfusion if his hemoglobin is 7 or he is symptomatic.  Oropharyngeal mass CT shows a mass in the right side of the oropharynx measuring 4 x 3 x 4 with soft tissue enlarged nodes in the right mandibular area concern about squamous metastatic disease. ENT has been notified, biopsy will be secondary, as we need to correct his thrombocytopenia and his new left hip fracture, in order for then proceed with biopsy.  History of  hepatitis C: He is not on treatment will need to follow-up with ID as an outpatient.  Goals of care: I discussed with the patient and his sister and he agrees he is a DNR/DNI, I will ask palliative care to come and talk about goals of care. As I was talking to him it seems like he is on the fence about having a biopsy especially with a mass in his neck with multiple lymph nodes in mandibular area.    DVT Prophylaxis: SCD Code Status: DNR  Family Communication: sister  Disposition Plan: inpatient      It is my clinical opinion that admission to INPATIENT is reasonable and necessary in this 78 y.o. male new hip fracture with acute kidney injury hyperkalemia and severe thrombocytopenia  Given the aforementioned, the predictability of an adverse outcome is felt to be significant. I expect that the patient will require at least 2 midnights in the hospital to treat this condition.  Erle Odell Castor MD [facility]   06/11/2024, 4:47 PM

## 2024-06-11 NOTE — ED Triage Notes (Addendum)
 Pt BIB EMS with CC of left hip pain s/p GLF after possible near syncopal episode 2 days ago. Pt reported he has been unable to get off the floor and spent two days crawling on floor before his sister found him today. AOx4

## 2024-06-11 NOTE — ED Notes (Signed)
 Verbal order of bag of platelets and then a CBC BY Odell Celinda Balo MD was given.

## 2024-06-11 NOTE — Consult Note (Signed)
 Reason for Consult: Throat mass Referring Physician: ER  Michael Chang is an 78 y.o. male.  HPI: 78 year old male with multiple medical problems was brought to the hospital after being found down in his home, having fallen two days prior and sustaining a fractured left femur.  He was found to have AKI, hyperkalemia, severe thrombocytopenia, and mild rhabdomyolysis.  Also in the workup, a mass was identified in his throat.  He has noticed several months of difficulty swallowing, voice change, and breathing difficulty and has lost a lot of weight.  Past Medical History:  Diagnosis Date   Anemia    Anemia in chronic kidney disease 03/02/2020   CAD (coronary artery disease)    Cataract (lens) fragments in eye following cataract surgery, right eye 03/15/2020   Chronic combined systolic (congestive) and diastolic (congestive) heart failure (HCC)    Chronic ulcer of right lower extremity (HCC)    CKD (chronic kidney disease), stage III (HCC)    Essential hypertension    HLD (hyperlipidemia)    Near syncope 02/14/2020   NSTEMI (non-ST elevated myocardial infarction) (HCC)    PVD (peripheral vascular disease) (HCC)     No past surgical history on file.  Family History  Problem Relation Age of Onset   Dementia Mother    Diabetes Mellitus II Father     Social History:  reports that he has never smoked. He has never used smokeless tobacco. He reports that he does not currently use alcohol. He reports that he does not currently use drugs after having used the following drugs: Heroin and Cocaine.  Allergies: No Known Allergies  Medications: I have reviewed the patient's current medications.  Results for orders placed or performed during the hospital encounter of 06/11/24 (from the past 48 hours)  CBC with Differential     Status: Abnormal   Collection Time: 06/11/24  2:13 PM  Result Value Ref Range   WBC 7.5 4.0 - 10.5 K/uL   RBC 2.97 (L) 4.22 - 5.81 MIL/uL   Hemoglobin 8.9 (L) 13.0 -  17.0 g/dL   HCT 72.3 (L) 60.9 - 47.9 %   MCV 92.9 80.0 - 100.0 fL   MCH 30.0 26.0 - 34.0 pg   MCHC 32.2 30.0 - 36.0 g/dL   RDW 85.5 88.4 - 84.4 %   Platelets 5 (LL) 150 - 400 K/uL    Comment: SPECIMEN CHECKED FOR CLOTS REPEATED TO VERIFY PLATELET COUNT CONFIRMED BY SMEAR Immature Platelet Fraction may be clinically indicated, consider ordering this additional test [id number]    nRBC 0.0 0.0 - 0.2 %   Neutrophils Relative % 80 %   Neutro Abs 6.0 1.7 - 7.7 K/uL   Lymphocytes Relative 13 %   Lymphs Abs 1.0 0.7 - 4.0 K/uL   Monocytes Relative 7 %   Monocytes Absolute 0.5 0.1 - 1.0 K/uL   Eosinophils Relative 0 %   Eosinophils Absolute 0.0 0.0 - 0.5 K/uL   Basophils Relative 0 %   Basophils Absolute 0.0 0.0 - 0.1 K/uL   WBC Morphology MORPHOLOGY UNREMARKABLE    RBC Morphology MORPHOLOGY UNREMARKABLE    Immature Granulocytes 0 %   Abs Immature Granulocytes 0.02 0.00 - 0.07 K/uL    Comment: Performed at [facility], [address], [place], [place] [address]  Comprehensive metabolic panel     Status: Abnormal   Collection Time: 06/11/24  2:13 PM  Result Value Ref Range   Sodium 144 135 - 145 mmol/L  Potassium 5.5 (H) 3.5 - 5.1 mmol/L    Comment: HEMOLYSIS AT THIS LEVEL MAY AFFECT RESULT   Chloride 108 98 - 111 mmol/L   CO2 21 (L) 22 - 32 mmol/L   Glucose, Bld 85 70 - 99 mg/dL    Comment: Glucose reference range applies only to samples taken after fasting for at least 8 hours.   BUN 56 (H) 8 - 23 mg/dL   Creatinine, Ser 6.34 (H) 0.61 - 1.24 mg/dL   Calcium 8.7 (L) 8.9 - 10.3 mg/dL   Total Protein 7.7 6.5 - 8.1 g/dL   Albumin 2.9 (L) 3.5 - 5.0 g/dL   AST 70 (H) 15 - 41 U/L    Comment: HEMOLYSIS AT THIS LEVEL MAY AFFECT RESULT   ALT 27 0 - 44 U/L    Comment: HEMOLYSIS AT THIS LEVEL MAY AFFECT RESULT   Alkaline Phosphatase 47 38 - 126 U/L   Total Bilirubin 1.6 (H) 0.0 - 1.2 mg/dL    Comment: HEMOLYSIS AT THIS LEVEL MAY AFFECT RESULT   GFR, Estimated 16 (L) >60 mL/min     Comment: (NOTE) Calculated using the CKD-EPI Creatinine Equation (2021)    Anion gap 15 5 - 15    Comment: Performed at [facility], [address], [place], [place] [address]  Lipase, blood     Status: None   Collection Time: 06/11/24  2:13 PM  Result Value Ref Range   Lipase 37 11 - 51 U/L    Comment: Performed at [facility], [address] [place], [place] [address]  Troponin I (High Sensitivity)     Status: Abnormal   Collection Time: 06/11/24  2:13 PM  Result Value Ref Range   Troponin I (High Sensitivity) 108 (HH) <18 ng/L    Comment: CRITICAL RESULT CALLED TO, READ BACK BY AND VERIFIED WITH T ROUNTREE PARAMEDIC 1541 06/11/2024 WBOND (NOTE) Elevated high sensitivity troponin I (hsTnI) values and significant  changes across serial measurements may suggest ACS but many other  chronic and acute conditions are known to elevate hsTnI results.  Refer to the Links section for chest pain algorithms and additional  guidance. Performed at [facility], [address] [place], [place] [address]   CK     Status: Abnormal   Collection Time: 06/11/24  2:13 PM  Result Value Ref Range   Total CK 1,667 (H) 49 - 397 U/L    Comment: HEMOLYSIS AT THIS LEVEL MAY AFFECT RESULT Performed at [facility], [address] [place], [place] [address]   I-stat chem 8, ED (not at [facility], [facility] or [facility])     Status: Abnormal   Collection Time: 06/11/24  2:20 PM  Result Value Ref Range   Sodium 145 135 - 145 mmol/L   Potassium 5.5 (H) 3.5 - 5.1 mmol/L   Chloride 112 (H) 98 - 111 mmol/L   BUN 59 (H) 8 - 23 mg/dL   Creatinine, Ser 6.19 (H) 0.61 - 1.24 mg/dL   Glucose, Bld 85 70 - 99 mg/dL    Comment: Glucose reference range applies only to samples taken after fasting for at least 8 hours.   Calcium, Ion 1.11 (L) 1.15 - 1.40 mmol/L   TCO2 22 22 - 32 mmol/L   Hemoglobin 8.5 (L) 13.0 - 17.0 g/dL   HCT 74.9 (L) 60.9 - 47.9 %  Prepare platelet pheresis     Status: None  (Preliminary result)   Collection Time: 06/11/24  3:56 PM  Result Value Ref Range   Unit Number [id number]    Blood Component Type PLTP2 PSORALEN TREATED    Unit division 00    Status of Unit ALLOCATED    Transfusion Status      OK TO TRANSFUSE Performed at [facility], [address], [place], [place] [address]    Unit Number [id number]    Blood Component Type PLTP3 PSORALEN TREATED    Unit division 00    Status of Unit ALLOCATED    Transfusion Status OK TO TRANSFUSE   Type and screen [facility]     Status: None   Collection Time: 06/11/24  4:43 PM  Result Value Ref Range   ABO/RH(D) A POS    Antibody Screen NEG    Sample Expiration      06/14/2024,2359 Performed at [facility], [address], [place], [place] [address]     CT Soft Tissue Neck Wo Contrast Result Date: 06/11/2024 EXAM: CT NECK WITHOUT CONTRAST 06/11/2024 03:09:19 PM TECHNIQUE: CT of the neck was performed without the administration of intravenous contrast. Multiplanar reformatted images are provided for review. Automated exposure control, iterative reconstruction, and/or weight based adjustment of the mA/kV was utilized to reduce the radiation dose to as low as reasonably achievable. COMPARISON: None available. CLINICAL HISTORY: Soft tissue mass of right tonsil. FINDINGS: AERODIGESTIVE TRACT: Soft tissue mass anchored along the right side of the oropharynx measures 3.9 x 3.3 x 3.8 cm. Lesion extends into the glosso tonsillar sulcus. The left palatine tonsil was unremarkable. SALIVARY GLANDS: The left submandibular gland is unremarkable. Asymmetric right submandibular soft tissue likely reflects enlarged nodes. THYROID : Unremarkable. LYMPH NODES: No significant lower adenopathy is present. SOFT TISSUES: No mass or fluid collection. BRAIN, ORBITS, SINUSES AND MASTOIDS: No acute abnormality. LUNGS AND MEDIASTINUM: No acute abnormality. BONES: No focal bone abnormality. IMPRESSION: 1.  Soft tissue mass anchored along the right side of the oropharynx, measuring 3.9 x 3.3 x 3.8 cm, extending into the glossotonsillar sulcus. 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 Asymmetric right submandibular soft tissue, likely reflecting enlarged nodes and worrisome for metastatic disease . Electronically signed by: Lonni Necessary MD 06/11/2024 03:25 PM EDT RP Workstation: [id number]   DG Hip Unilat With Pelvis 2-3 Views Left Result Date: 06/11/2024 CLINICAL DATA:  Pain after fall 2 days ago. EXAM: DG HIP (WITH OR WITHOUT PELVIS) 2-3V LEFT COMPARISON:  None Available. FINDINGS: Acute impacted and mildly displaced subcapital fracture of the left femoral head and neck junction. No dislocation. Moderate to advanced arthritic changes of the right hip with advanced joint space narrowing. Sacroiliac joints and pubic symphysis appear anatomically aligned. IMPRESSION: 1. Acute impacted and mildly displaced subcapital fracture of the left femoral head and neck junction. 2. Moderate to advanced arthritic changes of the right hip. Electronically Signed   By: Harrietta Sherry M.D.   On: 06/11/2024 14:26   DG Knee 1-2 Views Left Result Date: 06/11/2024 CLINICAL DATA:  Pain after fall two days ago. EXAM: LEFT KNEE - 1-2 VIEW COMPARISON:  None Available. FINDINGS: No evidence of acute fracture, dislocation, or joint effusion. Moderate medial femorotibial compartment predominant osteoarthritis. Patellar enthesopathy. Soft tissues are unremarkable. IMPRESSION: 1. No acute osseous abnormality. 2. Moderate medial femorotibial compartment predominant osteoarthritis. Electronically Signed   By: Harrietta Sherry M.D.   On: 06/11/2024 14:22   CT Head Wo Contrast Result Date: 06/11/2024 EXAM: CT HEAD WITHOUT CONTRAST 06/11/2024 01:45:49 PM TECHNIQUE: CT of  the head was performed without the administration of intravenous contrast. Automated exposure control, iterative reconstruction, and/or weight based  adjustment of the mA/kV was utilized to reduce the radiation dose to as low as reasonably achievable. COMPARISON: CT head 02/14/2020 CLINICAL HISTORY: Polytrauma, blunt. Pt BIB EMS with CC of left hip pain s/p GLF after possible near syncopal episode 2 days ago. Pt reported he has been unable to get off the floor and spent two days crawling on floor before his sister found him today. AOx4 FINDINGS: BRAIN AND VENTRICLES: Nonspecific hypoattenuation in the periventricular and subcortical white matter, most likely representing chronic microvascular ischemic changes. Remote lacunar infarcts in the left basal ganglia and in the bilateral corona radiata. Encephalomalacia in the medial left occipital lobe suggestive of remote infarct. Additional small remote infarcts in the posterior left cerebellum. Mild parenchymal volume loss. No acute hemorrhage. No evidence of acute infarct. No hydrocephalus. No extra-axial collection. No mass effect or midline shift. ORBITS: Bilateral lens replacement. Elongation of the globes, particularly on the left, suggestive of axial myopia. No acute abnormality. SINUSES: Mucosal thickening in the frontal sinuses and ethmoid sinuses bilaterally. Additional mucosal thickening and possible mucous retention cysts in the inferior aspect of the maxillary sinuses. No acute abnormality. SOFT TISSUES AND SKULL: There is possible asymmetric soft tissue involving the right palatine tonsil and soft palate appreciated on series 2 image 2. No acute soft tissue abnormality. No skull fracture. IMPRESSION: 1. No acute intracranial abnormality. 2. Mild-to-moderate chronic microvascular ischemic changes. Mild parenchymal volume loss. 3. Multiple remote infarcts as above. 4. Asymmetric soft tissue involving the right palatine tonsil and soft palate, partially visualized. Recommend correlation with direct visualization and consider CT neck soft tissue with contrast for further evaluation. Electronically signed by:  Donnice Mania MD 06/11/2024 02:14 PM EDT RP Workstation: [id number]   CT Cervical Spine Wo Contrast Result Date: 06/11/2024 CLINICAL DATA:  Polytrauma, blunt Near syncopal episode 2 days ago.  Found down. EXAM: CT CERVICAL SPINE WITHOUT CONTRAST TECHNIQUE: Multidetector CT imaging of the cervical spine was performed without intravenous contrast. Multiplanar CT image reconstructions were also generated. RADIATION DOSE REDUCTION: This exam was performed according to the departmental dose-optimization program which includes automated exposure control, adjustment of the mA and/or kV according to patient size and/or use of iterative reconstruction technique. COMPARISON:  None Available. FINDINGS: Alignment: Normal. Skull base and vertebrae: No evidence of acute cervical spine fracture or traumatic subluxation. Soft tissues and spinal canal: No prevertebral fluid or swelling. No visible canal hematoma. Disc levels: Multilevel relatively mild spondylosis with disc space narrowing and uncinate spurring from C3-4 through C6-7. No evidence of large disc herniation or high-grade spinal stenosis. No more than mild foraminal narrowing identified. Upper chest: Clear lung apices. Other: Probable small mucous retention cysts in the maxillary sinuses bilaterally. Prominent mylohyoid ossification, left greater than right. IMPRESSION: 1. No evidence of acute cervical spine fracture, traumatic subluxation or static signs of instability. 2. Mild cervical spondylosis. Electronically Signed   By: Elsie Perone M.D.   On: 06/11/2024 14:04    Review of Systems  HENT:  Positive for trouble swallowing and voice change.   Respiratory:  Positive for shortness of breath.   Musculoskeletal:  Positive for arthralgias.  All other systems reviewed and are negative.  Blood pressure (!) 142/99, pulse 87, resp. rate 18, height 6' 1 (1.854 m), weight 65 kg, SpO2 100%. Physical Exam Constitutional:      Appearance: Normal appearance.      Comments: Cachectic  HENT:     Head: Normocephalic and atraumatic.     Right Ear: External ear normal.     Left Ear: External ear normal.     Nose: Nose normal.     Mouth/Throat:     Comments: Large mass from right side of oropharynx filling much of the oropharynx, involvement of right side of soft palate.  Voice sounds muffled.  No stridor. Eyes:     Extraocular Movements: Extraocular movements intact.     Pupils: Pupils are equal, round, and reactive to light.  Cardiovascular:     Rate and Rhythm: Normal rate.  Pulmonary:     Effort: Pulmonary effort is normal.  Skin:    General: Skin is warm and dry.  Neurological:     General: No focal deficit present.     Mental Status: He is alert and oriented to person, place, and time.  Psychiatric:        Mood and Affect: Mood normal.        Behavior: Behavior normal.        Thought Content: Thought content normal.        Judgment: Judgment normal.     Assessment/Plan: Oropharyngeal mass  I personally reviewed his neck CT and examined him at the bedside.  Fiberoptic exam was also performed, see procedure note.  He has a large tumor blocking much of his oropharynx that is right-sided and extends down the right pharyngeal wall.  This likely represents carcinoma.  The tumor presents a challenge for safe orotracheal intubation but it appears that this can be performed carefully down the left side of the throat, when needed.  Biopsy can performed at that time.  He requires medical stabilization prior to any surgery.  I discussed his case with the orthopedic service and the ER provider.  Vaughan Ricker 06/11/2024, 5:49 PM

## 2024-06-11 NOTE — ED Notes (Signed)
 IV team bedside.

## 2024-06-11 NOTE — ED Notes (Signed)
 CCMD called.

## 2024-06-11 NOTE — Procedures (Signed)
 Preop diagnosis: Oropharyngeal mass Postop diagnosis: same Procedure: Transnasal fiberoptic laryngoscopy Surgeon: Carlie Anesth: Topical with 4% lidocaine  Compl: None Findings: Retro palatal space restricted by the oropharyngeal mass.  Mass effect along right pharyngeal wall to upper pyriform region.  Larynx appears normal with good vocal fold mobility and no obstruction by tumor. Description:  After discussing risks, benefits, and alternatives, the patient was placed in a seated position and the right nasal passage was sprayed with topical anesthetic.  The fiberoptic scope was passed through the right nasal passage to view the pharynx and larynx.  Findings are noted above.  The scope was then removed and he was returned to nursing care in stable condition.

## 2024-06-11 NOTE — ED Notes (Signed)
 Sister Felicia called for an update on pt. She requests to be contacted for further updates.

## 2024-06-11 NOTE — H&P (View-Only) (Signed)
 Reason for Consult: Throat mass Referring Physician: ER  Michael Chang is an 78 y.o. male.  HPI: 78 year old male with multiple medical problems was brought to the hospital after being found down in his home, having fallen two days prior and sustaining a fractured left femur.  He was found to have AKI, hyperkalemia, severe thrombocytopenia, and mild rhabdomyolysis.  Also in the workup, a mass was identified in his throat.  He has noticed several months of difficulty swallowing, voice change, and breathing difficulty and has lost a lot of weight.  Past Medical History:  Diagnosis Date   Anemia    Anemia in chronic kidney disease 03/02/2020   CAD (coronary artery disease)    Cataract (lens) fragments in eye following cataract surgery, right eye 03/15/2020   Chronic combined systolic (congestive) and diastolic (congestive) heart failure (HCC)    Chronic ulcer of right lower extremity (HCC)    CKD (chronic kidney disease), stage III (HCC)    Essential hypertension    HLD (hyperlipidemia)    Near syncope 02/14/2020   NSTEMI (non-ST elevated myocardial infarction) (HCC)    PVD (peripheral vascular disease) (HCC)     No past surgical history on file.  Family History  Problem Relation Age of Onset   Dementia Mother    Diabetes Mellitus II Father     Social History:  reports that he has never smoked. He has never used smokeless tobacco. He reports that he does not currently use alcohol. He reports that he does not currently use drugs after having used the following drugs: Heroin and Cocaine.  Allergies: No Known Allergies  Medications: I have reviewed the patient's current medications.  Results for orders placed or performed during the hospital encounter of 06/11/24 (from the past 48 hours)  CBC with Differential     Status: Abnormal   Collection Time: 06/11/24  2:13 PM  Result Value Ref Range   WBC 7.5 4.0 - 10.5 K/uL   RBC 2.97 (L) 4.22 - 5.81 MIL/uL   Hemoglobin 8.9 (L) 13.0 -  17.0 g/dL   HCT 72.3 (L) 60.9 - 47.9 %   MCV 92.9 80.0 - 100.0 fL   MCH 30.0 26.0 - 34.0 pg   MCHC 32.2 30.0 - 36.0 g/dL   RDW 85.5 88.4 - 84.4 %   Platelets 5 (LL) 150 - 400 K/uL    Comment: SPECIMEN CHECKED FOR CLOTS REPEATED TO VERIFY PLATELET COUNT CONFIRMED BY SMEAR Immature Platelet Fraction may be clinically indicated, consider ordering this additional test [id number]    nRBC 0.0 0.0 - 0.2 %   Neutrophils Relative % 80 %   Neutro Abs 6.0 1.7 - 7.7 K/uL   Lymphocytes Relative 13 %   Lymphs Abs 1.0 0.7 - 4.0 K/uL   Monocytes Relative 7 %   Monocytes Absolute 0.5 0.1 - 1.0 K/uL   Eosinophils Relative 0 %   Eosinophils Absolute 0.0 0.0 - 0.5 K/uL   Basophils Relative 0 %   Basophils Absolute 0.0 0.0 - 0.1 K/uL   WBC Morphology MORPHOLOGY UNREMARKABLE    RBC Morphology MORPHOLOGY UNREMARKABLE    Immature Granulocytes 0 %   Abs Immature Granulocytes 0.02 0.00 - 0.07 K/uL    Comment: Performed at [facility], [address], [place], [place] [address]  Comprehensive metabolic panel     Status: Abnormal   Collection Time: 06/11/24  2:13 PM  Result Value Ref Range   Sodium 144 135 - 145 mmol/L  Potassium 5.5 (H) 3.5 - 5.1 mmol/L    Comment: HEMOLYSIS AT THIS LEVEL MAY AFFECT RESULT   Chloride 108 98 - 111 mmol/L   CO2 21 (L) 22 - 32 mmol/L   Glucose, Bld 85 70 - 99 mg/dL    Comment: Glucose reference range applies only to samples taken after fasting for at least 8 hours.   BUN 56 (H) 8 - 23 mg/dL   Creatinine, Ser 6.34 (H) 0.61 - 1.24 mg/dL   Calcium 8.7 (L) 8.9 - 10.3 mg/dL   Total Protein 7.7 6.5 - 8.1 g/dL   Albumin 2.9 (L) 3.5 - 5.0 g/dL   AST 70 (H) 15 - 41 U/L    Comment: HEMOLYSIS AT THIS LEVEL MAY AFFECT RESULT   ALT 27 0 - 44 U/L    Comment: HEMOLYSIS AT THIS LEVEL MAY AFFECT RESULT   Alkaline Phosphatase 47 38 - 126 U/L   Total Bilirubin 1.6 (H) 0.0 - 1.2 mg/dL    Comment: HEMOLYSIS AT THIS LEVEL MAY AFFECT RESULT   GFR, Estimated 16 (L) >60 mL/min     Comment: (NOTE) Calculated using the CKD-EPI Creatinine Equation (2021)    Anion gap 15 5 - 15    Comment: Performed at [facility], [address], [place], [place] [address]  Lipase, blood     Status: None   Collection Time: 06/11/24  2:13 PM  Result Value Ref Range   Lipase 37 11 - 51 U/L    Comment: Performed at [facility], [address] [place], [place] [address]  Troponin I (High Sensitivity)     Status: Abnormal   Collection Time: 06/11/24  2:13 PM  Result Value Ref Range   Troponin I (High Sensitivity) 108 (HH) <18 ng/L    Comment: CRITICAL RESULT CALLED TO, READ BACK BY AND VERIFIED WITH T ROUNTREE PARAMEDIC 1541 06/11/2024 WBOND (NOTE) Elevated high sensitivity troponin I (hsTnI) values and significant  changes across serial measurements may suggest ACS but many other  chronic and acute conditions are known to elevate hsTnI results.  Refer to the Links section for chest pain algorithms and additional  guidance. Performed at [facility], [address] [place], [place] [address]   CK     Status: Abnormal   Collection Time: 06/11/24  2:13 PM  Result Value Ref Range   Total CK 1,667 (H) 49 - 397 U/L    Comment: HEMOLYSIS AT THIS LEVEL MAY AFFECT RESULT Performed at [facility], [address] [place], [place] [address]   I-stat chem 8, ED (not at [facility], [facility] or [facility])     Status: Abnormal   Collection Time: 06/11/24  2:20 PM  Result Value Ref Range   Sodium 145 135 - 145 mmol/L   Potassium 5.5 (H) 3.5 - 5.1 mmol/L   Chloride 112 (H) 98 - 111 mmol/L   BUN 59 (H) 8 - 23 mg/dL   Creatinine, Ser 6.19 (H) 0.61 - 1.24 mg/dL   Glucose, Bld 85 70 - 99 mg/dL    Comment: Glucose reference range applies only to samples taken after fasting for at least 8 hours.   Calcium, Ion 1.11 (L) 1.15 - 1.40 mmol/L   TCO2 22 22 - 32 mmol/L   Hemoglobin 8.5 (L) 13.0 - 17.0 g/dL   HCT 74.9 (L) 60.9 - 47.9 %  Prepare platelet pheresis     Status: None  (Preliminary result)   Collection Time: 06/11/24  3:56 PM  Result Value Ref Range   Unit Number [id number]    Blood Component Type PLTP2 PSORALEN TREATED    Unit division 00    Status of Unit ALLOCATED    Transfusion Status      OK TO TRANSFUSE Performed at [facility], [address], [place], [place] [address]    Unit Number [id number]    Blood Component Type PLTP3 PSORALEN TREATED    Unit division 00    Status of Unit ALLOCATED    Transfusion Status OK TO TRANSFUSE   Type and screen [facility]     Status: None   Collection Time: 06/11/24  4:43 PM  Result Value Ref Range   ABO/RH(D) A POS    Antibody Screen NEG    Sample Expiration      06/14/2024,2359 Performed at [facility], [address], [place], [place] [address]     CT Soft Tissue Neck Wo Contrast Result Date: 06/11/2024 EXAM: CT NECK WITHOUT CONTRAST 06/11/2024 03:09:19 PM TECHNIQUE: CT of the neck was performed without the administration of intravenous contrast. Multiplanar reformatted images are provided for review. Automated exposure control, iterative reconstruction, and/or weight based adjustment of the mA/kV was utilized to reduce the radiation dose to as low as reasonably achievable. COMPARISON: None available. CLINICAL HISTORY: Soft tissue mass of right tonsil. FINDINGS: AERODIGESTIVE TRACT: Soft tissue mass anchored along the right side of the oropharynx measures 3.9 x 3.3 x 3.8 cm. Lesion extends into the glosso tonsillar sulcus. The left palatine tonsil was unremarkable. SALIVARY GLANDS: The left submandibular gland is unremarkable. Asymmetric right submandibular soft tissue likely reflects enlarged nodes. THYROID : Unremarkable. LYMPH NODES: No significant lower adenopathy is present. SOFT TISSUES: No mass or fluid collection. BRAIN, ORBITS, SINUSES AND MASTOIDS: No acute abnormality. LUNGS AND MEDIASTINUM: No acute abnormality. BONES: No focal bone abnormality. IMPRESSION: 1.  Soft tissue mass anchored along the right side of the oropharynx, measuring 3.9 x 3.3 x 3.8 cm, extending into the glossotonsillar sulcus. 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 Asymmetric right submandibular soft tissue, likely reflecting enlarged nodes and worrisome for metastatic disease . Electronically signed by: Lonni Necessary MD 06/11/2024 03:25 PM EDT RP Workstation: [id number]   DG Hip Unilat With Pelvis 2-3 Views Left Result Date: 06/11/2024 CLINICAL DATA:  Pain after fall 2 days ago. EXAM: DG HIP (WITH OR WITHOUT PELVIS) 2-3V LEFT COMPARISON:  None Available. FINDINGS: Acute impacted and mildly displaced subcapital fracture of the left femoral head and neck junction. No dislocation. Moderate to advanced arthritic changes of the right hip with advanced joint space narrowing. Sacroiliac joints and pubic symphysis appear anatomically aligned. IMPRESSION: 1. Acute impacted and mildly displaced subcapital fracture of the left femoral head and neck junction. 2. Moderate to advanced arthritic changes of the right hip. Electronically Signed   By: Harrietta Sherry M.D.   On: 06/11/2024 14:26   DG Knee 1-2 Views Left Result Date: 06/11/2024 CLINICAL DATA:  Pain after fall two days ago. EXAM: LEFT KNEE - 1-2 VIEW COMPARISON:  None Available. FINDINGS: No evidence of acute fracture, dislocation, or joint effusion. Moderate medial femorotibial compartment predominant osteoarthritis. Patellar enthesopathy. Soft tissues are unremarkable. IMPRESSION: 1. No acute osseous abnormality. 2. Moderate medial femorotibial compartment predominant osteoarthritis. Electronically Signed   By: Harrietta Sherry M.D.   On: 06/11/2024 14:22   CT Head Wo Contrast Result Date: 06/11/2024 EXAM: CT HEAD WITHOUT CONTRAST 06/11/2024 01:45:49 PM TECHNIQUE: CT of  the head was performed without the administration of intravenous contrast. Automated exposure control, iterative reconstruction, and/or weight based  adjustment of the mA/kV was utilized to reduce the radiation dose to as low as reasonably achievable. COMPARISON: CT head 02/14/2020 CLINICAL HISTORY: Polytrauma, blunt. Pt BIB EMS with CC of left hip pain s/p GLF after possible near syncopal episode 2 days ago. Pt reported he has been unable to get off the floor and spent two days crawling on floor before his sister found him today. AOx4 FINDINGS: BRAIN AND VENTRICLES: Nonspecific hypoattenuation in the periventricular and subcortical white matter, most likely representing chronic microvascular ischemic changes. Remote lacunar infarcts in the left basal ganglia and in the bilateral corona radiata. Encephalomalacia in the medial left occipital lobe suggestive of remote infarct. Additional small remote infarcts in the posterior left cerebellum. Mild parenchymal volume loss. No acute hemorrhage. No evidence of acute infarct. No hydrocephalus. No extra-axial collection. No mass effect or midline shift. ORBITS: Bilateral lens replacement. Elongation of the globes, particularly on the left, suggestive of axial myopia. No acute abnormality. SINUSES: Mucosal thickening in the frontal sinuses and ethmoid sinuses bilaterally. Additional mucosal thickening and possible mucous retention cysts in the inferior aspect of the maxillary sinuses. No acute abnormality. SOFT TISSUES AND SKULL: There is possible asymmetric soft tissue involving the right palatine tonsil and soft palate appreciated on series 2 image 2. No acute soft tissue abnormality. No skull fracture. IMPRESSION: 1. No acute intracranial abnormality. 2. Mild-to-moderate chronic microvascular ischemic changes. Mild parenchymal volume loss. 3. Multiple remote infarcts as above. 4. Asymmetric soft tissue involving the right palatine tonsil and soft palate, partially visualized. Recommend correlation with direct visualization and consider CT neck soft tissue with contrast for further evaluation. Electronically signed by:  Donnice Mania MD 06/11/2024 02:14 PM EDT RP Workstation: [id number]   CT Cervical Spine Wo Contrast Result Date: 06/11/2024 CLINICAL DATA:  Polytrauma, blunt Near syncopal episode 2 days ago.  Found down. EXAM: CT CERVICAL SPINE WITHOUT CONTRAST TECHNIQUE: Multidetector CT imaging of the cervical spine was performed without intravenous contrast. Multiplanar CT image reconstructions were also generated. RADIATION DOSE REDUCTION: This exam was performed according to the departmental dose-optimization program which includes automated exposure control, adjustment of the mA and/or kV according to patient size and/or use of iterative reconstruction technique. COMPARISON:  None Available. FINDINGS: Alignment: Normal. Skull base and vertebrae: No evidence of acute cervical spine fracture or traumatic subluxation. Soft tissues and spinal canal: No prevertebral fluid or swelling. No visible canal hematoma. Disc levels: Multilevel relatively mild spondylosis with disc space narrowing and uncinate spurring from C3-4 through C6-7. No evidence of large disc herniation or high-grade spinal stenosis. No more than mild foraminal narrowing identified. Upper chest: Clear lung apices. Other: Probable small mucous retention cysts in the maxillary sinuses bilaterally. Prominent mylohyoid ossification, left greater than right. IMPRESSION: 1. No evidence of acute cervical spine fracture, traumatic subluxation or static signs of instability. 2. Mild cervical spondylosis. Electronically Signed   By: Elsie Perone M.D.   On: 06/11/2024 14:04    Review of Systems  HENT:  Positive for trouble swallowing and voice change.   Respiratory:  Positive for shortness of breath.   Musculoskeletal:  Positive for arthralgias.  All other systems reviewed and are negative.  Blood pressure (!) 142/99, pulse 87, resp. rate 18, height 6' 1 (1.854 m), weight 65 kg, SpO2 100%. Physical Exam Constitutional:      Appearance: Normal appearance.      Comments: Cachectic  HENT:     Head: Normocephalic and atraumatic.     Right Ear: External ear normal.     Left Ear: External ear normal.     Nose: Nose normal.     Mouth/Throat:     Comments: Large mass from right side of oropharynx filling much of the oropharynx, involvement of right side of soft palate.  Voice sounds muffled.  No stridor. Eyes:     Extraocular Movements: Extraocular movements intact.     Pupils: Pupils are equal, round, and reactive to light.  Cardiovascular:     Rate and Rhythm: Normal rate.  Pulmonary:     Effort: Pulmonary effort is normal.  Skin:    General: Skin is warm and dry.  Neurological:     General: No focal deficit present.     Mental Status: He is alert and oriented to person, place, and time.  Psychiatric:        Mood and Affect: Mood normal.        Behavior: Behavior normal.        Thought Content: Thought content normal.        Judgment: Judgment normal.     Assessment/Plan: Oropharyngeal mass  I personally reviewed his neck CT and examined him at the bedside.  Fiberoptic exam was also performed, see procedure note.  He has a large tumor blocking much of his oropharynx that is right-sided and extends down the right pharyngeal wall.  This likely represents carcinoma.  The tumor presents a challenge for safe orotracheal intubation but it appears that this can be performed carefully down the left side of the throat, when needed.  Biopsy can performed at that time.  He requires medical stabilization prior to any surgery.  I discussed his case with the orthopedic service and the ER provider.  Vaughan Ricker 06/11/2024, 5:49 PM

## 2024-06-11 NOTE — H&P (View-Only) (Signed)
 Reason for Consult:Left hip fx Referring Physician: Juliene Bicker Time called: 1436 Time at bedside: 1445   Michael Chang is an 78 y.o. male.  HPI: [name] fell out of his recliner 2d ago. He had immediate left hip pain and could not get up. He lay there until today and was brought to the ED. X-rays showed a left hip fx and orthopedic surgery was consulted. He lives at home alone and does not use any assistive devices to ambulate.  Past Medical History:  Diagnosis Date   Anemia    Anemia in chronic kidney disease 03/02/2020   CAD (coronary artery disease)    Cataract (lens) fragments in eye following cataract surgery, right eye 03/15/2020   Chronic combined systolic (congestive) and diastolic (congestive) heart failure (HCC)    Chronic ulcer of right lower extremity (HCC)    CKD (chronic kidney disease), stage III (HCC)    Essential hypertension    HLD (hyperlipidemia)    Near syncope 02/14/2020   NSTEMI (non-ST elevated myocardial infarction) (HCC)    PVD (peripheral vascular disease) (HCC)     No past surgical history on file.  No family history on file.  Social History:  reports that he has never smoked. He has never used smokeless tobacco. He reports that he does not currently use alcohol. No history on file for drug use.  Allergies: No Known Allergies  Medications: I have reviewed the patient's current medications.  Results for orders placed or performed during the hospital encounter of 06/11/24 (from the past 48 hours)  I-stat chem 8, ED (not at [facility], [facility] or [facility])     Status: Abnormal   Collection Time: 06/11/24  2:20 PM  Result Value Ref Range   Sodium 145 135 - 145 mmol/L   Potassium 5.5 (H) 3.5 - 5.1 mmol/L   Chloride 112 (H) 98 - 111 mmol/L   BUN 59 (H) 8 - 23 mg/dL   Creatinine, Ser 6.19 (H) 0.61 - 1.24 mg/dL   Glucose, Bld 85 70 - 99 mg/dL    Comment: Glucose reference range applies only to samples taken after fasting for at least 8 hours.   Calcium, Ion 1.11  (L) 1.15 - 1.40 mmol/L   TCO2 22 22 - 32 mmol/L   Hemoglobin 8.5 (L) 13.0 - 17.0 g/dL   HCT 74.9 (L) 60.9 - 47.9 %    DG Hip Unilat With Pelvis 2-3 Views Left Result Date: 06/11/2024 CLINICAL DATA:  Pain after fall 2 days ago. EXAM: DG HIP (WITH OR WITHOUT PELVIS) 2-3V LEFT COMPARISON:  None Available. FINDINGS: Acute impacted and mildly displaced subcapital fracture of the left femoral head and neck junction. No dislocation. Moderate to advanced arthritic changes of the right hip with advanced joint space narrowing. Sacroiliac joints and pubic symphysis appear anatomically aligned. IMPRESSION: 1. Acute impacted and mildly displaced subcapital fracture of the left femoral head and neck junction. 2. Moderate to advanced arthritic changes of the right hip. Electronically Signed   By: Harrietta Sherry M.D.   On: 06/11/2024 14:26   DG Knee 1-2 Views Left Result Date: 06/11/2024 CLINICAL DATA:  Pain after fall two days ago. EXAM: LEFT KNEE - 1-2 VIEW COMPARISON:  None Available. FINDINGS: No evidence of acute fracture, dislocation, or joint effusion. Moderate medial femorotibial compartment predominant osteoarthritis. Patellar enthesopathy. Soft tissues are unremarkable. IMPRESSION: 1. No acute osseous abnormality. 2. Moderate medial femorotibial compartment predominant osteoarthritis. Electronically Signed   By: Harrietta Sherry M.D.   On: 06/11/2024 14:22  CT Head Wo Contrast Result Date: 06/11/2024 EXAM: CT HEAD WITHOUT CONTRAST 06/11/2024 01:45:49 PM TECHNIQUE: CT of the head was performed without the administration of intravenous contrast. Automated exposure control, iterative reconstruction, and/or weight based adjustment of the mA/kV was utilized to reduce the radiation dose to as low as reasonably achievable. COMPARISON: CT head 02/14/2020 CLINICAL HISTORY: Polytrauma, blunt. Pt BIB EMS with CC of left hip pain s/p GLF after possible near syncopal episode 2 days ago. Pt reported he has been unable  to get off the floor and spent two days crawling on floor before his sister found him today. AOx4 FINDINGS: BRAIN AND VENTRICLES: Nonspecific hypoattenuation in the periventricular and subcortical white matter, most likely representing chronic microvascular ischemic changes. Remote lacunar infarcts in the left basal ganglia and in the bilateral corona radiata. Encephalomalacia in the medial left occipital lobe suggestive of remote infarct. Additional small remote infarcts in the posterior left cerebellum. Mild parenchymal volume loss. No acute hemorrhage. No evidence of acute infarct. No hydrocephalus. No extra-axial collection. No mass effect or midline shift. ORBITS: Bilateral lens replacement. Elongation of the globes, particularly on the left, suggestive of axial myopia. No acute abnormality. SINUSES: Mucosal thickening in the frontal sinuses and ethmoid sinuses bilaterally. Additional mucosal thickening and possible mucous retention cysts in the inferior aspect of the maxillary sinuses. No acute abnormality. SOFT TISSUES AND SKULL: There is possible asymmetric soft tissue involving the right palatine tonsil and soft palate appreciated on series 2 image 2. No acute soft tissue abnormality. No skull fracture. IMPRESSION: 1. No acute intracranial abnormality. 2. Mild-to-moderate chronic microvascular ischemic changes. Mild parenchymal volume loss. 3. Multiple remote infarcts as above. 4. Asymmetric soft tissue involving the right palatine tonsil and soft palate, partially visualized. Recommend correlation with direct visualization and consider CT neck soft tissue with contrast for further evaluation. Electronically signed by: Donnice Mania MD 06/11/2024 02:14 PM EDT RP Workstation: [id number]   CT Cervical Spine Wo Contrast Result Date: 06/11/2024 CLINICAL DATA:  Polytrauma, blunt Near syncopal episode 2 days ago.  Found down. EXAM: CT CERVICAL SPINE WITHOUT CONTRAST TECHNIQUE: Multidetector CT imaging of the  cervical spine was performed without intravenous contrast. Multiplanar CT image reconstructions were also generated. RADIATION DOSE REDUCTION: This exam was performed according to the departmental dose-optimization program which includes automated exposure control, adjustment of the mA and/or kV according to patient size and/or use of iterative reconstruction technique. COMPARISON:  None Available. FINDINGS: Alignment: Normal. Skull base and vertebrae: No evidence of acute cervical spine fracture or traumatic subluxation. Soft tissues and spinal canal: No prevertebral fluid or swelling. No visible canal hematoma. Disc levels: Multilevel relatively mild spondylosis with disc space narrowing and uncinate spurring from C3-4 through C6-7. No evidence of large disc herniation or high-grade spinal stenosis. No more than mild foraminal narrowing identified. Upper chest: Clear lung apices. Other: Probable small mucous retention cysts in the maxillary sinuses bilaterally. Prominent mylohyoid ossification, left greater than right. IMPRESSION: 1. No evidence of acute cervical spine fracture, traumatic subluxation or static signs of instability. 2. Mild cervical spondylosis. Electronically Signed   By: Elsie Perone M.D.   On: 06/11/2024 14:04    Review of Systems  HENT:  Negative for ear discharge, ear pain, hearing loss and tinnitus.   Eyes:  Negative for photophobia and pain.  Respiratory:  Negative for cough and shortness of breath.   Cardiovascular:  Negative for chest pain.  Gastrointestinal:  Negative for abdominal pain, nausea and vomiting.  Genitourinary:  Negative  for dysuria, flank pain, frequency and urgency.  Musculoskeletal:  Positive for arthralgias (Left hip). Negative for back pain, myalgias and neck pain.  Neurological:  Negative for dizziness and headaches.  Hematological:  Does not bruise/bleed easily.  Psychiatric/Behavioral:  The patient is not nervous/anxious.    Blood pressure (!)  177/104, pulse 94, resp. rate (!) 23, height 6' 1 (1.854 m), weight 65 kg, SpO2 100%. Physical Exam Constitutional:      General: He is not in acute distress.    Appearance: He is well-developed. He is not diaphoretic.  HENT:     Head: Normocephalic and atraumatic.  Eyes:     General: No scleral icterus.       Right eye: No discharge.        Left eye: No discharge.     Conjunctiva/sclera: Conjunctivae normal.  Cardiovascular:     Rate and Rhythm: Normal rate and regular rhythm.  Pulmonary:     Effort: Pulmonary effort is normal. No respiratory distress.  Musculoskeletal:     Cervical back: Normal range of motion.     Comments: LLE No traumatic wounds, ecchymosis, or rash  Mild TTP hip  No knee or ankle effusion  Knee stable to varus/ valgus and anterior/posterior stress  Sens DPN, SPN, TN intact  Motor EHL, ext, flex, evers 5/5  DP 2+, PT 1+, No significant edema  Skin:    General: Skin is warm and dry.  Neurological:     Mental Status: He is alert.  Psychiatric:        Mood and Affect: Mood normal.        Behavior: Behavior normal.     Assessment/Plan: Left hip fx -- Tentatively plan THA tomorrow with Dr. Kendal. Please keep NPO after MN.    Ozell DOROTHA Ned, PA-C Orthopedic Surgery [phone] 06/11/2024, 2:53 PM

## 2024-06-12 DIAGNOSIS — J392 Other diseases of pharynx: Secondary | ICD-10-CM | POA: Diagnosis not present

## 2024-06-12 DIAGNOSIS — K1379 Other lesions of oral mucosa: Secondary | ICD-10-CM | POA: Diagnosis not present

## 2024-06-12 DIAGNOSIS — D696 Thrombocytopenia, unspecified: Secondary | ICD-10-CM | POA: Diagnosis not present

## 2024-06-12 DIAGNOSIS — S72002A Fracture of unspecified part of neck of left femur, initial encounter for closed fracture: Secondary | ICD-10-CM | POA: Diagnosis not present

## 2024-06-12 DIAGNOSIS — R55 Syncope and collapse: Secondary | ICD-10-CM | POA: Diagnosis not present

## 2024-06-12 LAB — PREPARE PLATELET PHERESIS
Unit division: 0
Unit division: 0
Unit division: 0

## 2024-06-12 LAB — CBC WITH DIFFERENTIAL/PLATELET
Abs Immature Granulocytes: 0.02 K/uL (ref 0.00–0.07)
Abs Immature Granulocytes: 0.04 K/uL (ref 0.00–0.07)
Basophils Absolute: 0 K/uL (ref 0.0–0.1)
Basophils Absolute: 0 K/uL (ref 0.0–0.1)
Basophils Relative: 0 %
Basophils Relative: 0 %
Eosinophils Absolute: 0 K/uL (ref 0.0–0.5)
Eosinophils Absolute: 0 K/uL (ref 0.0–0.5)
Eosinophils Relative: 0 %
Eosinophils Relative: 0 %
HCT: 27.6 % — ABNORMAL LOW (ref 39.0–52.0)
HCT: 31.3 % — ABNORMAL LOW (ref 39.0–52.0)
Hemoglobin: 8.9 g/dL — ABNORMAL LOW (ref 13.0–17.0)
Hemoglobin: 10.1 g/dL — ABNORMAL LOW (ref 13.0–17.0)
Immature Granulocytes: 0 %
Immature Granulocytes: 0 %
Lymphocytes Relative: 13 %
Lymphocytes Relative: 6 %
Lymphs Abs: 1 K/uL (ref 0.7–4.0)
Lymphs Abs: 0.5 K/uL — ABNORMAL LOW (ref 0.7–4.0)
MCH: 30 pg (ref 26.0–34.0)
MCH: 29 pg (ref 26.0–34.0)
MCHC: 32.2 g/dL (ref 30.0–36.0)
MCHC: 32.3 g/dL (ref 30.0–36.0)
MCV: 92.9 fL (ref 80.0–100.0)
MCV: 89.9 fL (ref 80.0–100.0)
Monocytes Absolute: 0.5 K/uL (ref 0.1–1.0)
Monocytes Absolute: 0.3 K/uL (ref 0.1–1.0)
Monocytes Relative: 7 %
Monocytes Relative: 4 %
Neutro Abs: 6 K/uL (ref 1.7–7.7)
Neutro Abs: 8.2 K/uL — ABNORMAL HIGH (ref 1.7–7.7)
Neutrophils Relative %: 80 %
Neutrophils Relative %: 90 %
Platelets: 5 K/uL — CL (ref 150–400)
Platelets: 353 K/uL (ref 150–400)
RBC: 2.97 MIL/uL — ABNORMAL LOW (ref 4.22–5.81)
RBC: 3.48 MIL/uL — ABNORMAL LOW (ref 4.22–5.81)
RDW: 14.4 % (ref 11.5–15.5)
RDW: 14.3 % (ref 11.5–15.5)
WBC: 7.5 K/uL (ref 4.0–10.5)
WBC: 9.1 K/uL (ref 4.0–10.5)
nRBC: 0 % (ref 0.0–0.2)
nRBC: 0 % (ref 0.0–0.2)

## 2024-06-12 LAB — BPAM PLATELET PHERESIS
Blood Product Expiration Date: 202509122359
ISSUE DATE / TIME: 202509111821
ISSUE DATE / TIME: 202509112045
ISSUE DATE / TIME: 202509120130
ISSUE DATE / TIME: 202509142359
ISSUE DATE / TIME: 202509142359
Unit Type and Rh: 202509122359
Unit Type and Rh: 202509142359
Unit Type and Rh: 202509142359
Unit Type and Rh: 5100
Unit Type and Rh: 6200
Unit Type and Rh: 6200

## 2024-06-12 LAB — CBC
HCT: 30.6 % — ABNORMAL LOW (ref 39.0–52.0)
HCT: 33.7 % — ABNORMAL LOW (ref 39.0–52.0)
Hemoglobin: 9.9 g/dL — ABNORMAL LOW (ref 13.0–17.0)
Hemoglobin: 10.7 g/dL — ABNORMAL LOW (ref 13.0–17.0)
MCH: 29.2 pg (ref 26.0–34.0)
MCH: 29.1 pg (ref 26.0–34.0)
MCHC: 32.4 g/dL (ref 30.0–36.0)
MCHC: 31.8 g/dL (ref 30.0–36.0)
MCV: 90.3 fL (ref 80.0–100.0)
MCV: 91.6 fL (ref 80.0–100.0)
Platelets: 343 K/uL (ref 150–400)
Platelets: 367 K/uL (ref 150–400)
RBC: 3.39 MIL/uL — ABNORMAL LOW (ref 4.22–5.81)
RBC: 3.68 MIL/uL — ABNORMAL LOW (ref 4.22–5.81)
RDW: 14.4 % (ref 11.5–15.5)
RDW: 14.4 % (ref 11.5–15.5)
WBC: 10.7 K/uL — ABNORMAL HIGH (ref 4.0–10.5)
WBC: 9.2 K/uL (ref 4.0–10.5)
nRBC: 0 % (ref 0.0–0.2)
nRBC: 0 % (ref 0.0–0.2)

## 2024-06-12 LAB — PROTIME-INR
INR: 1.2 (ref 0.8–1.2)
Prothrombin Time: 15.9 s — ABNORMAL HIGH (ref 11.4–15.2)

## 2024-06-12 LAB — BASIC METABOLIC PANEL WITH GFR
Anion gap: 16 — ABNORMAL HIGH (ref 5–15)
BUN: 45 mg/dL — ABNORMAL HIGH (ref 8–23)
CO2: 23 mmol/L (ref 22–32)
Calcium: 8.4 mg/dL — ABNORMAL LOW (ref 8.9–10.3)
Chloride: 107 mmol/L (ref 98–111)
Creatinine, Ser: 2.53 mg/dL — ABNORMAL HIGH (ref 0.61–1.24)
GFR, Estimated: 25 mL/min — ABNORMAL LOW (ref >60)
Glucose, Bld: 97 mg/dL (ref 70–99)
Potassium: 4.1 mmol/L (ref 3.5–5.1)
Sodium: 146 mmol/L — ABNORMAL HIGH (ref 135–145)

## 2024-06-12 LAB — TECHNOLOGIST SMEAR REVIEW: Plt Morphology: DECREASED

## 2024-06-12 LAB — SURGICAL PCR SCREEN
MRSA, PCR: NEGATIVE
Staphylococcus aureus: POSITIVE — AB

## 2024-06-12 LAB — CK: Total CK: 784 U/L — ABNORMAL HIGH (ref 49–397)

## 2024-06-12 MED ORDER — CHLORHEXIDINE GLUCONATE 4 % EX SOLN
60.0000 mL | Freq: Once | CUTANEOUS | Status: AC
  Administered 2024-06-13: 4 via TOPICAL

## 2024-06-12 NOTE — Plan of Care (Signed)
  Problem: Education: Goal: Knowledge of General Education information will improve Description: Including pain rating scale, medication(s)/side effects and non-pharmacologic comfort measures Outcome: Progressing   Problem: Activity: Goal: Risk for activity intolerance will decrease Outcome: Not Progressing   Problem: Nutrition: Goal: Adequate nutrition will be maintained Outcome: Progressing   Problem: Coping: Goal: Level of anxiety will decrease Outcome: Progressing   Problem: Elimination: Goal: Will not experience complications related to bowel motility Outcome: Progressing Goal: Will not experience complications related to urinary retention Outcome: Progressing   Problem: Pain Managment: Goal: General experience of comfort will improve and/or be controlled Outcome: Progressing

## 2024-06-12 NOTE — TOC CAGE-AID Note (Signed)
 Transition of Care [facility]) - CAGE-AID Screening  Patient Details  Name: Michael Chang MRN: [id number] Date of Birth: 30-Sep-1946  Clinical Narrative:  Patient endorses previous Heroin and Cocaine use, quit using approx 12 years ago. Patient endorses occasional marijuana use and social alcohol use but rarely. Patient denies current need for substance abuse resources.  CAGE-AID Screening:   Have You Ever Felt You Ought to Cut Down on Your Drinking or Drug Use?: Yes Have People Annoyed You By Critizing Your Drinking Or Drug Use?: No Have You Felt Bad Or Guilty About Your Drinking Or Drug Use?: No Have You Ever Had a Drink or Used Drugs First Thing In The Morning to Steady Your Nerves or to Get Rid of a Hangover?: No CAGE-AID Score: 1  Substance Abuse Education Offered: Yes

## 2024-06-12 NOTE — Progress Notes (Signed)
 [facility] PROGRESS NOTE    Progress Note  Adit Riddles  FMW:[id number] DOB: May 18, 1946 DOA: 06/11/2024 PCP: Gretta Comer POUR, NP     Brief Narrative:   Michael Chang is an 78 y.o. male  past medical history of essential hypertension, chronic diastolic dysfunction, history of hepatitis C, with a remote history of heroin use many years ago, chronic kidney disease stage IIIb, coronary artery disease comes into the hospital after fall that happened 2 days prior to admission.  was sitting down he denies any loss of consciousness but he started feeling dizzy and felt from the sitting position to the left side after he fell he could not get up and spent 2 days crawling around the floor in his house, and due to the pain he called EMS was he was able to get to the phone.  He denies any loss of consciousness no change in vision and only dizziness right before the fall. He relates he is also been getting short of breath for the last several months and he has had some dysp of his voice.  And some difficulty swallowing   Assessment/Plan:   Closed left hip fracture [facility]) Orthopedic surgery was consulted, they recommended to stabilize before proceeding with surgical intervention.  Severe thrombocytopenia: Hematology was consulted recommended to start steroids for concern of ITP. He status post 2 units of platelet counts. CBC to follow-up on platelets is pending this morning. Awaiting hematology further recommendations.  Preop evaluation: Patient is at risk of complications due to his elevated potassium and new acute kidney injury.   PLT Count of 5000 on admision. PT/INR 17/1.4. Will need to stabilize before proceeding with surgical intervention.  Acute kidney injury on chronic kidney disease stage IIIb: With a baseline creatinine of 1.7-1.2 on admission 3.8. He was started on IV fluids, likely hemodynamically mediated in the setting of antihypertensive medication and decreased oral  intake. Basic metabolic panels pending this morning. He is positive about 1.5 L.  Hyperkalemia: With a baseline creatinine 1.7-2.2, on admission his potassium was 5.5. He was started on Lokelma  and IV fluids.  Mild traumatic rhabdomyolysis: He was started on IV fluids CK is pending this morning.  Essential hypertension Continue to hold all antihypertensive medication continue IV fluids.  Normocytic anemia normally he runs around 12 on admission 8. Question chronic blood loss from oropharyngeal mass versus infiltrative disease. CBC is pending this morning.  Oropharyngeal mass: CTA of the head and neck showed a right sided oropharyngeal mass. ENT was consulted who performed a transnasal fiberoptic laryngoscopy and showed retropalatal space mass with restricted space with mass effect.  Larynx appears normal good vocal fold fold mobility and not obstruction by tumor.  Will need biopsy once platelets are improved.  History of hepatitis C: Noted.  Goals of care: Patient is a DNR/DNI. Palliative Care has been consulted.   DVT prophylaxis: SCD Family Communication:sister Status is: Inpatient Remains inpatient appropriate because: hip fracture    Code Status:     Code Status Orders  (From admission, onward)           Start     Ordered   06/11/24 1705  Do not attempt resuscitation (DNR)- Limited -Do Not Intubate (DNI)  Continuous       Question Answer Comment  If pulseless and not breathing No CPR or chest compressions.   In Pre-Arrest Conditions (Patient Is Breathing and Has A Pulse) Do not intubate. Provide all appropriate non-invasive medical interventions. Avoid ICU transfer unless indicated or  required.   Consent: Discussion documented in EHR or advanced directives reviewed      06/11/24 1709           Code Status History     Date Active Date Inactive Code Status Order ID Comments User Context   06/11/2024 1645 06/11/2024 1709 Limited: Do not attempt  resuscitation (DNR) -DNR-LIMITED -Do Not Intubate/DNI  [id number]  Odell Celinda Balo, MD ED   02/14/2020 0740 02/15/2020 1735 Full Code [id number]  Lanetta Lingo, MD ED         IV Access:   Peripheral IV   Procedures and diagnostic studies:   CT Soft Tissue Neck Wo Contrast Result Date: 06/11/2024 EXAM: CT NECK WITHOUT CONTRAST 06/11/2024 03:09:19 PM TECHNIQUE: CT of the neck was performed without the administration of intravenous contrast. Multiplanar reformatted images are provided for review. Automated exposure control, iterative reconstruction, and/or weight based adjustment of the mA/kV was utilized to reduce the radiation dose to as low as reasonably achievable. COMPARISON: None available. CLINICAL HISTORY: Soft tissue mass of right tonsil. FINDINGS: AERODIGESTIVE TRACT: Soft tissue mass anchored along the right side of the oropharynx measures 3.9 x 3.3 x 3.8 cm. Lesion extends into the glosso tonsillar sulcus. The left palatine tonsil was unremarkable. SALIVARY GLANDS: The left submandibular gland is unremarkable. Asymmetric right submandibular soft tissue likely reflects enlarged nodes. THYROID : Unremarkable. LYMPH NODES: No significant lower adenopathy is present. SOFT TISSUES: No mass or fluid collection. BRAIN, ORBITS, SINUSES AND MASTOIDS: No acute abnormality. LUNGS AND MEDIASTINUM: No acute abnormality. BONES: No focal bone abnormality. IMPRESSION: 1. Soft tissue mass anchored along the right side of the oropharynx, measuring 3.9 x 3.3 x 3.8 cm, extending into the glossotonsillar sulcus. 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 Asymmetric right submandibular soft tissue, likely reflecting enlarged nodes and worrisome for metastatic disease . Electronically signed by: Lonni Necessary MD 06/11/2024 03:25 PM EDT RP Workstation: [id number]   DG Hip Unilat With Pelvis 2-3 Views Left Result Date: 06/11/2024 CLINICAL DATA:  Pain after fall 2 days ago. EXAM: DG HIP  (WITH OR WITHOUT PELVIS) 2-3V LEFT COMPARISON:  None Available. FINDINGS: Acute impacted and mildly displaced subcapital fracture of the left femoral head and neck junction. No dislocation. Moderate to advanced arthritic changes of the right hip with advanced joint space narrowing. Sacroiliac joints and pubic symphysis appear anatomically aligned. IMPRESSION: 1. Acute impacted and mildly displaced subcapital fracture of the left femoral head and neck junction. 2. Moderate to advanced arthritic changes of the right hip. Electronically Signed   By: Harrietta Sherry M.D.   On: 06/11/2024 14:26   DG Knee 1-2 Views Left Result Date: 06/11/2024 CLINICAL DATA:  Pain after fall two days ago. EXAM: LEFT KNEE - 1-2 VIEW COMPARISON:  None Available. FINDINGS: No evidence of acute fracture, dislocation, or joint effusion. Moderate medial femorotibial compartment predominant osteoarthritis. Patellar enthesopathy. Soft tissues are unremarkable. IMPRESSION: 1. No acute osseous abnormality. 2. Moderate medial femorotibial compartment predominant osteoarthritis. Electronically Signed   By: Harrietta Sherry M.D.   On: 06/11/2024 14:22   CT Head Wo Contrast Result Date: 06/11/2024 EXAM: CT HEAD WITHOUT CONTRAST 06/11/2024 01:45:49 PM TECHNIQUE: CT of the head was performed without the administration of intravenous contrast. Automated exposure control, iterative reconstruction, and/or weight based adjustment of the mA/kV was utilized to reduce the radiation dose to as low as reasonably achievable. COMPARISON: CT head 02/14/2020 CLINICAL HISTORY: Polytrauma, blunt. Pt BIB EMS with CC of left hip pain s/p GLF after  possible near syncopal episode 2 days ago. Pt reported he has been unable to get off the floor and spent two days crawling on floor before his sister found him today. AOx4 FINDINGS: BRAIN AND VENTRICLES: Nonspecific hypoattenuation in the periventricular and subcortical white matter, most likely representing chronic  microvascular ischemic changes. Remote lacunar infarcts in the left basal ganglia and in the bilateral corona radiata. Encephalomalacia in the medial left occipital lobe suggestive of remote infarct. Additional small remote infarcts in the posterior left cerebellum. Mild parenchymal volume loss. No acute hemorrhage. No evidence of acute infarct. No hydrocephalus. No extra-axial collection. No mass effect or midline shift. ORBITS: Bilateral lens replacement. Elongation of the globes, particularly on the left, suggestive of axial myopia. No acute abnormality. SINUSES: Mucosal thickening in the frontal sinuses and ethmoid sinuses bilaterally. Additional mucosal thickening and possible mucous retention cysts in the inferior aspect of the maxillary sinuses. No acute abnormality. SOFT TISSUES AND SKULL: There is possible asymmetric soft tissue involving the right palatine tonsil and soft palate appreciated on series 2 image 2. No acute soft tissue abnormality. No skull fracture. IMPRESSION: 1. No acute intracranial abnormality. 2. Mild-to-moderate chronic microvascular ischemic changes. Mild parenchymal volume loss. 3. Multiple remote infarcts as above. 4. Asymmetric soft tissue involving the right palatine tonsil and soft palate, partially visualized. Recommend correlation with direct visualization and consider CT neck soft tissue with contrast for further evaluation. Electronically signed by: Donnice Mania MD 06/11/2024 02:14 PM EDT RP Workstation: [id number]   CT Cervical Spine Wo Contrast Result Date: 06/11/2024 CLINICAL DATA:  Polytrauma, blunt Near syncopal episode 2 days ago.  Found down. EXAM: CT CERVICAL SPINE WITHOUT CONTRAST TECHNIQUE: Multidetector CT imaging of the cervical spine was performed without intravenous contrast. Multiplanar CT image reconstructions were also generated. RADIATION DOSE REDUCTION: This exam was performed according to the departmental dose-optimization program which includes  automated exposure control, adjustment of the mA and/or kV according to patient size and/or use of iterative reconstruction technique. COMPARISON:  None Available. FINDINGS: Alignment: Normal. Skull base and vertebrae: No evidence of acute cervical spine fracture or traumatic subluxation. Soft tissues and spinal canal: No prevertebral fluid or swelling. No visible canal hematoma. Disc levels: Multilevel relatively mild spondylosis with disc space narrowing and uncinate spurring from C3-4 through C6-7. No evidence of large disc herniation or high-grade spinal stenosis. No more than mild foraminal narrowing identified. Upper chest: Clear lung apices. Other: Probable small mucous retention cysts in the maxillary sinuses bilaterally. Prominent mylohyoid ossification, left greater than right. IMPRESSION: 1. No evidence of acute cervical spine fracture, traumatic subluxation or static signs of instability. 2. Mild cervical spondylosis. Electronically Signed   By: Elsie Perone M.D.   On: 06/11/2024 14:04     Medical Consultants:   None.   Subjective:    Charles Andringa pain is controlled.  Objective:    Vitals:   06/11/24 2021 06/11/24 2054 06/11/24 2119 06/11/24 2218  BP: (!) 165/117 (!) 165/117 (!) 184/126 (!) 153/99  Pulse: 70 81 73 83  Resp: 12 14 16    Temp: 98 F (36.7 C) 98.8 F (37.1 C) 98.7 F (37.1 C) 98.7 F (37.1 C)  TempSrc: Oral Oral Oral Oral  SpO2: 100%  100% 99%  Weight:      Height:       SpO2: 99 %   Intake/Output Summary (Last 24 hours) at 06/12/2024 0558 Last data filed at 06/11/2024 2218 Gross per 24 hour  Intake 1349 ml  Output --  Net 1349 ml   Filed Weights   06/11/24 1306  Weight: 65 kg    Exam: General exam: In no acute distress. Respiratory system: Good air movement and clear to auscultation. Cardiovascular system: S1 & S2 heard, RRR. No JVD. Gastrointestinal system: Abdomen is nondistended, soft and nontender.  Extremities: No pedal  edema. Skin: No rashes, lesions or ulcers Psychiatry: Judgement and insight appear normal. Mood & affect appropriate.    Data Reviewed:    Labs: Basic Metabolic Panel: Recent Labs  Lab 06/11/24 1413 06/11/24 1420 06/11/24 2301  NA 144 145  --   K 5.5* 5.5*  --   CL 108 112*  --   CO2 21*  --   --   GLUCOSE 85 85  --   BUN 56* 59*  --   CREATININE 3.65* 3.80*  --   CALCIUM 8.7*  --   --   MG  --   --  2.2   GFR Estimated Creatinine Clearance: 14.7 mL/min (A) (by C-G formula based on SCr of 3.8 mg/dL (H)). Liver Function Tests: Recent Labs  Lab 06/11/24 1413  AST 70*  ALT 27  ALKPHOS 47  BILITOT 1.6*  PROT 7.7  ALBUMIN 2.9*   Recent Labs  Lab 06/11/24 1413  LIPASE 37   No results for input(s): AMMONIA in the last 168 hours. Coagulation profile Recent Labs  Lab 06/11/24 2301  INR 1.4*   COVID-19 Labs  No results for input(s): DDIMER, FERRITIN, LDH, CRP in the last 72 hours.  Lab Results  Component Value Date   SARSCOV2NAA NEGATIVE 02/14/2020    CBC: Recent Labs  Lab 06/11/24 1413 06/11/24 1420  WBC 7.5  --   NEUTROABS 6.0  --   HGB 8.9* 8.5*  HCT 27.6* 25.0*  MCV 92.9  --   PLT 5*  --    Cardiac Enzymes: Recent Labs  Lab 06/11/24 1413  CKTOTAL 1,667*   BNP (last 3 results) No results for input(s): PROBNP in the last 8760 hours. CBG: No results for input(s): GLUCAP in the last 168 hours. D-Dimer: No results for input(s): DDIMER in the last 72 hours. Hgb A1c: No results for input(s): HGBA1C in the last 72 hours. Lipid Profile: No results for input(s): CHOL, HDL, LDLCALC, TRIG, CHOLHDL, LDLDIRECT in the last 72 hours. Thyroid  function studies: No results for input(s): TSH, T4TOTAL, T3FREE, THYROIDAB in the last 72 hours.  Invalid input(s): FREET3 Anemia work up: No results for input(s): VITAMINB12, FOLATE, FERRITIN, TIBC, IRON, RETICCTPCT in the last 72 hours. Sepsis  Labs: Recent Labs  Lab 06/11/24 1413  WBC 7.5   Microbiology No results found for this or any previous visit (from the past 240 hours).   Medications:    sodium chloride    Intravenous Once   sodium chloride    Intravenous Once   polyethylene glycol  17 g Oral BID   sodium zirconium cyclosilicate   10 g Oral BID   Continuous Infusions:  sodium chloride  75 mL/hr at 06/12/24 0135      LOS: 1 day   Erle Odell Castor  [facility]  06/12/2024, 5:58 AM

## 2024-06-12 NOTE — Consult Note (Addendum)
 ADDENDUM:  Patient was personally and independently interviewed, examined and relevant elements of the history of present illness were reviewed in details and an assessment and plan was created. All elements of the patient's history of present illness, assessment and plan were discussed in detail with Olam JINNY Brunner, NP. The above documentation reflects our combined findings assessment and plan.    Briefly, 78 year old gentleman admitted to the hospital after a fall at home that led to left hip fracture.  On arrival, labs showed platelet count of 5000 and on CT of the neck, there was a soft tissue mass along the right side of oropharynx, concerning for oropharyngeal carcinoma.  Hence we were consulted for additional recommendations.  Patient was given 1 dose of prednisone  60 mg yesterday.  Repeat labs today showed platelet count of 367,000, which was confirmed on repeat later in the day at 353,000.  This makes us  wonder if the initial lab was an error.  No additional intervention is needed for platelets at this time.  He does have normocytic anemia, likely anemia of chronic disease from CKD.  ENT Dr. Carlie was contacted and plan is for outpatient biopsy of the oropharyngeal mass.  If confirmed to be malignancy, treatments will be initiated in the outpatient setting, pending biopsy results.  He will need concurrent chemoradiation, provided no other evidence of metastatic disease is noted on staging workup with PET scan.  Left hip fracture management and other management is as per orthopedics and primary team.  Please call us  with any questions.  [facility] CONSULT NOTE  Patient Care Team: Gretta Comer POUR, NP as PCP - General (Internal Medicine) Darron Deatrice LABOR, MD as PCP - Cardiology (Cardiology) Fate Morna SAILOR, [facility] (Inactive) as Pharmacist (Pharmacist)  CHIEF COMPLAINTS/PURPOSE OF CONSULTATION:  Low Platelets & concern for oropharyngeal cancer  REFERRING  PHYSICIAN: Dr. Celinda  HISTORY OF PRESENTING ILLNESS:  Michael Chang 78 y.o. male who was admitted on 06/11/2024 with complaints of pain after a fall that happened at home.  Workup done in the ED included blood work which showed very low platelet count 5K.  There was also a throat mass noted in the imaging.  Therefore hematology oncology consult was requested. Patient is seen awake alert and oriented laying in bed.  Reports that his hip was fractured in the fall at home.  Admits to using a cane most times to ambulate at home.  States he has pain in the left hip however he feels a little better now.  Denies chest pain, nausea, vomiting, bleeding.  Patient admits to difficulty swallowing food for a few weeks.  States he knew something was wrong. Medical history includes hypertension, hyperlipidemia, hepatitis C, CKD, anemia and CAD. Family oncologic history includes paternal grandfather with stomach cancer. Social history social history is significant for heroin and cocaine use, reports that he stopped 12 years ago.  Admits to occasional marijuana use.  Admits to social alcohol use.  Denies tobacco use.  Lives in an apartment.  Worked in [employer] and denies occupational hazardous exposure.     I have reviewed his chart and materials related to his cancer extensively and collaborated history with the patient. Summary of oncologic history is as follows: Oncology History   No history exists.    ASSESSMENT & PLAN:  Thrombocytopenia - Platelets were 5K on admission on 06/11/2024.  Status post platelet transfusion. - Status post prednisone  60 mg x 1 dose - Significant increase in platelet count noted at  367K today.  Question if the initial value was a lab error. - No transfusional intervention required at this time - Continue to monitor CBC with differential  Anemia, normocytic - Hemoglobin 8.9 on admission.  Currently 9.9. - Likely multifactorial due to renal dysfunction and  likely malignancy - Recommend PRBC transfusion for hemoglobin <7.0 - No transfusional intervention required at this time. - Continue to monitor CBC with differential  Dysphagia Oral pharyngeal mass with lymphadenopathy - Patient reports several weeks history of difficulty swallowing. - CT imaging done 06/11/2024 shows soft tissue mass on right side of oral pharynx, likely representing squamous cell carcinoma.  Suspect metastatic disease. - Will likely need biopsy for definitive path and treatment planning purposes. - Medical oncology/Dr. Latrena Benegas will make further evaluation and treatment recommendations.  CKD - Elevated creatinine and BUN - Likely contributing factor to anemia - Avoid nephrotoxic agents - Consideration for nephrology eval - Continue to monitor renal function  Left hip fracture - Acute impacted fracture of left femoral head and neck per imaging done 06/11/2024 - Management per Ortho   MEDICAL HISTORY:  Past Medical History:  Diagnosis Date   Anemia    Anemia in chronic kidney disease 03/02/2020   CAD (coronary artery disease)    Cataract (lens) fragments in eye following cataract surgery, right eye 03/15/2020   Chronic combined systolic (congestive) and diastolic (congestive) heart failure (HCC)    Chronic ulcer of right lower extremity (HCC)    CKD (chronic kidney disease), stage III (HCC)    Essential hypertension    HLD (hyperlipidemia)    Near syncope 02/14/2020   NSTEMI (non-ST elevated myocardial infarction) (HCC)    PVD (peripheral vascular disease) (HCC)     SURGICAL HISTORY: No past surgical history on file.  SOCIAL HISTORY: Social History   Socioeconomic History   Marital status: Single    Spouse name: Not on file   Number of children: Not on file   Years of education: Not on file   Highest education level: Not on file  Occupational History   Not on file  Tobacco Use   Smoking status: Never   Smokeless tobacco: Never  Substance and  Sexual Activity   Alcohol use: Not Currently   Drug use: Not Currently    Types: Heroin, Cocaine   Sexual activity: Not Currently  Other Topics Concern   Not on file  Social History Narrative   Not on file   Social Drivers of Health   Financial Resource Strain: High Risk (10/31/2023)   Overall Financial Resource Strain (CARDIA)    Difficulty of Paying Living Expenses: Very hard  Food Insecurity: No Food Insecurity (06/12/2024)   Hunger Vital Sign    Worried About Running Out of Food in the Last Year: Never true    Ran Out of Food in the Last Year: Never true  Transportation Needs: No Transportation Needs (06/12/2024)   PRAPARE - Administrator, Civil Service (Medical): No    Lack of Transportation (Non-Medical): No  Physical Activity: Sufficiently Active (10/31/2023)   Exercise Vital Sign    Days of Exercise per Week: 4 days    Minutes of Exercise per Session: 40 min  Stress: No Stress Concern Present (10/31/2023)   [employer] of Occupational Health - Occupational Stress Questionnaire    Feeling of Stress : Not at all  Social Connections: Socially Isolated (06/12/2024)   Social Connection and Isolation Panel    Frequency of Communication with Friends and Family: Once  a week    Frequency of Social Gatherings with Friends and Family: Never    Attends Religious Services: Never    Database administrator or Organizations: No    Attends Banker Meetings: Never    Marital Status: Never married  Intimate Partner Violence: Not At Risk (06/12/2024)   Humiliation, Afraid, Rape, and Kick questionnaire    Fear of Current or Ex-Partner: No    Emotionally Abused: No    Physically Abused: No    Sexually Abused: No    FAMILY HISTORY: Family History  Problem Relation Age of Onset   Dementia Mother    Diabetes Mellitus II Father      PHYSICAL EXAMINATION: ECOG PERFORMANCE STATUS: 4 - Bedbound  Vitals:   06/11/24 2218 06/12/24 0852  BP: (!) 153/99 (!)  159/93  Pulse: 83 86  Resp:  16  Temp: 98.7 F (37.1 C) 97.7 F (36.5 C)  SpO2: 99% 99%   Filed Weights   06/11/24 1306  Weight: 143 lb 4.8 oz (65 kg)    GENERAL: alert, + ill-appearing + frail + thin appearing SKIN: + Lesion left ankle with dressing intact, patient reports he has had nonhealing ulcer for several years EYES: normal, conjunctiva are pink and non-injected, sclera clear OROPHARYNX: no exudate, no erythema and lips, buccal mucosa, and tongue normal  NECK: supple, thyroid  normal size, non-tender, without nodularity LYMPH: no palpable lymphadenopathy in the cervical, axillary or inguinal LUNGS: clear to auscultation and percussion with normal breathing effort HEART: regular rate & rhythm and no murmurs and no lower extremity edema ABDOMEN: abdomen soft, non-tender and normal bowel sounds MUSCULOSKELETAL: no cyanosis of digits and no clubbing  PSYCH: alert & oriented x 3 with fluent speech NEURO: no focal motor/sensory deficits   ALLERGIES:  has no known allergies.  MEDICATIONS:  Current Facility-Administered Medications  Medication Dose Route Frequency Provider Last Rate Last Admin   0.9 %  sodium chloride  infusion (Manually program via Guardrails IV Fluids)   Intravenous Once Odell Celinda Balo, MD   Held at 06/11/24 2059   0.9 %  sodium chloride  infusion (Manually program via Guardrails IV Fluids)   Intravenous Once Ruthe Cornet, DO   Paused at 06/11/24 2059   0.9 %  sodium chloride  infusion   Intravenous Continuous Odell Celinda Balo, MD 75 mL/hr at 06/12/24 0135 New Bag at 06/12/24 0135   acetaminophen  (TYLENOL ) tablet 650 mg  650 mg Oral Q6H PRN Odell Celinda Balo, MD       Or   acetaminophen  (TYLENOL ) suppository 650 mg  650 mg Rectal Q6H PRN Odell Celinda Balo, MD       HYDROcodone -acetaminophen  (NORCO/VICODIN) 5-325 MG per tablet 1-2 tablet  1-2 tablet Oral Q4H PRN Odell Celinda Balo, MD       ondansetron  (ZOFRAN ) tablet 4 mg  4 mg Oral Q6H PRN  Odell Celinda Balo, MD       Or   ondansetron  (ZOFRAN ) injection 4 mg  4 mg Intravenous Q6H PRN Odell Celinda Balo, MD       polyethylene glycol (MIRALAX  / GLYCOLAX ) packet 17 g  17 g Oral BID Odell Celinda Balo, MD   17 g at 06/12/24 1048   sodium zirconium cyclosilicate  (LOKELMA ) packet 10 g  10 g Oral BID Odell Celinda Balo, MD   10 g at 06/12/24 1048     LABORATORY DATA:  I have reviewed the data as listed Lab Results  Component Value Date   WBC 9.1 06/12/2024  HGB 10.1 (L) 06/12/2024   HCT 31.3 (L) 06/12/2024   MCV 89.9 06/12/2024   PLT 353 06/12/2024   Recent Labs    03/05/24 0953 06/11/24 1413 06/11/24 1420 06/12/24 0453  NA 143 144 145 146*  K 5.0 5.5* 5.5* 4.1  CL 104 108 112* 107  CO2 29 21*  --  23  GLUCOSE 97 85 85 97  BUN 24* 56* 59* 45*  CREATININE 2.03* 3.65* 3.80* 2.53*  CALCIUM 9.3 8.7*  --  8.4*  GFRNONAA  --  16*  --  25*  PROT 7.9 7.7  --   --   ALBUMIN 3.6 2.9*  --   --   AST 27 70*  --   --   ALT 19 27  --   --   ALKPHOS 50 47  --   --   BILITOT 0.4 1.6*  --   --     RADIOGRAPHIC STUDIES: I have personally reviewed the radiological images as listed and agreed with the findings in the report. CT Soft Tissue Neck Wo Contrast Result Date: 06/11/2024 EXAM: CT NECK WITHOUT CONTRAST 06/11/2024 03:09:19 PM TECHNIQUE: CT of the neck was performed without the administration of intravenous contrast. Multiplanar reformatted images are provided for review. Automated exposure control, iterative reconstruction, and/or weight based adjustment of the mA/kV was utilized to reduce the radiation dose to as low as reasonably achievable. COMPARISON: None available. CLINICAL HISTORY: Soft tissue mass of right tonsil. FINDINGS: AERODIGESTIVE TRACT: Soft tissue mass anchored along the right side of the oropharynx measures 3.9 x 3.3 x 3.8 cm. Lesion extends into the glosso tonsillar sulcus. The left palatine tonsil was unremarkable. SALIVARY GLANDS: The left  submandibular gland is unremarkable. Asymmetric right submandibular soft tissue likely reflects enlarged nodes. THYROID : Unremarkable. LYMPH NODES: No significant lower adenopathy is present. SOFT TISSUES: No mass or fluid collection. BRAIN, ORBITS, SINUSES AND MASTOIDS: No acute abnormality. LUNGS AND MEDIASTINUM: No acute abnormality. BONES: No focal bone abnormality. IMPRESSION: 1. Soft tissue mass anchored along the right side of the oropharynx, measuring 3.9 x 3.3 x 3.8 cm, extending into the glossotonsillar sulcus. 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 Asymmetric right submandibular soft tissue, likely reflecting enlarged nodes and worrisome for metastatic disease . Electronically signed by: Lonni Necessary MD 06/11/2024 03:25 PM EDT RP Workstation: [id number]   DG Hip Unilat With Pelvis 2-3 Views Left Result Date: 06/11/2024 CLINICAL DATA:  Pain after fall 2 days ago. EXAM: DG HIP (WITH OR WITHOUT PELVIS) 2-3V LEFT COMPARISON:  None Available. FINDINGS: Acute impacted and mildly displaced subcapital fracture of the left femoral head and neck junction. No dislocation. Moderate to advanced arthritic changes of the right hip with advanced joint space narrowing. Sacroiliac joints and pubic symphysis appear anatomically aligned. IMPRESSION: 1. Acute impacted and mildly displaced subcapital fracture of the left femoral head and neck junction. 2. Moderate to advanced arthritic changes of the right hip. Electronically Signed   By: Harrietta Sherry M.D.   On: 06/11/2024 14:26   DG Knee 1-2 Views Left Result Date: 06/11/2024 CLINICAL DATA:  Pain after fall two days ago. EXAM: LEFT KNEE - 1-2 VIEW COMPARISON:  None Available. FINDINGS: No evidence of acute fracture, dislocation, or joint effusion. Moderate medial femorotibial compartment predominant osteoarthritis. Patellar enthesopathy. Soft tissues are unremarkable. IMPRESSION: 1. No acute osseous abnormality. 2. Moderate medial  femorotibial compartment predominant osteoarthritis. Electronically Signed   By: Harrietta Sherry M.D.   On: 06/11/2024 14:22   CT  Head Wo Contrast Result Date: 06/11/2024 EXAM: CT HEAD WITHOUT CONTRAST 06/11/2024 01:45:49 PM TECHNIQUE: CT of the head was performed without the administration of intravenous contrast. Automated exposure control, iterative reconstruction, and/or weight based adjustment of the mA/kV was utilized to reduce the radiation dose to as low as reasonably achievable. COMPARISON: CT head 02/14/2020 CLINICAL HISTORY: Polytrauma, blunt. Pt BIB EMS with CC of left hip pain s/p GLF after possible near syncopal episode 2 days ago. Pt reported he has been unable to get off the floor and spent two days crawling on floor before his sister found him today. AOx4 FINDINGS: BRAIN AND VENTRICLES: Nonspecific hypoattenuation in the periventricular and subcortical white matter, most likely representing chronic microvascular ischemic changes. Remote lacunar infarcts in the left basal ganglia and in the bilateral corona radiata. Encephalomalacia in the medial left occipital lobe suggestive of remote infarct. Additional small remote infarcts in the posterior left cerebellum. Mild parenchymal volume loss. No acute hemorrhage. No evidence of acute infarct. No hydrocephalus. No extra-axial collection. No mass effect or midline shift. ORBITS: Bilateral lens replacement. Elongation of the globes, particularly on the left, suggestive of axial myopia. No acute abnormality. SINUSES: Mucosal thickening in the frontal sinuses and ethmoid sinuses bilaterally. Additional mucosal thickening and possible mucous retention cysts in the inferior aspect of the maxillary sinuses. No acute abnormality. SOFT TISSUES AND SKULL: There is possible asymmetric soft tissue involving the right palatine tonsil and soft palate appreciated on series 2 image 2. No acute soft tissue abnormality. No skull fracture. IMPRESSION: 1. No acute  intracranial abnormality. 2. Mild-to-moderate chronic microvascular ischemic changes. Mild parenchymal volume loss. 3. Multiple remote infarcts as above. 4. Asymmetric soft tissue involving the right palatine tonsil and soft palate, partially visualized. Recommend correlation with direct visualization and consider CT neck soft tissue with contrast for further evaluation. Electronically signed by: Donnice Mania MD 06/11/2024 02:14 PM EDT RP Workstation: [id number]   CT Cervical Spine Wo Contrast Result Date: 06/11/2024 CLINICAL DATA:  Polytrauma, blunt Near syncopal episode 2 days ago.  Found down. EXAM: CT CERVICAL SPINE WITHOUT CONTRAST TECHNIQUE: Multidetector CT imaging of the cervical spine was performed without intravenous contrast. Multiplanar CT image reconstructions were also generated. RADIATION DOSE REDUCTION: This exam was performed according to the departmental dose-optimization program which includes automated exposure control, adjustment of the mA and/or kV according to patient size and/or use of iterative reconstruction technique. COMPARISON:  None Available. FINDINGS: Alignment: Normal. Skull base and vertebrae: No evidence of acute cervical spine fracture or traumatic subluxation. Soft tissues and spinal canal: No prevertebral fluid or swelling. No visible canal hematoma. Disc levels: Multilevel relatively mild spondylosis with disc space narrowing and uncinate spurring from C3-4 through C6-7. No evidence of large disc herniation or high-grade spinal stenosis. No more than mild foraminal narrowing identified. Upper chest: Clear lung apices. Other: Probable small mucous retention cysts in the maxillary sinuses bilaterally. Prominent mylohyoid ossification, left greater than right. IMPRESSION: 1. No evidence of acute cervical spine fracture, traumatic subluxation or static signs of instability. 2. Mild cervical spondylosis. Electronically Signed   By: Elsie Perone M.D.   On: 06/11/2024 14:04      The total time spent in the appointment was 55 minutes encounter with patients including review of chart and various tests results, discussions about plan of care and coordination of care plan   All questions were answered. The patient knows to call the clinic with any problems, questions or concerns. No barriers to learning was detected.  Olam PARAS Rouson, NP 9/12/20251:25 PM

## 2024-06-12 NOTE — Progress Notes (Signed)
 Pt admitted to the floor at about 2300. He was getting the last transfusion of unit of platelets. Lab call about 0100 am reporting a critical on platelets that happen on 1413 pm. Provider was notified of the completion of transfusion.

## 2024-06-12 NOTE — Progress Notes (Signed)
 Date and time results received: 06/11/2024 1114 was not reported yesterday when the patient was in ED but the lab called this morning at 0654 to report about the critical valve.  Test: platelets Critical Value: 5  Name of Provider Notified: Erle Celinda Clunes, MD  Orders Received: the critical value reporting by the Lab was done during the shift change, MD was notified this morning. Patient was received from ED with platelets transfusing. The morning value of platelets drawn at 0453 was 343.

## 2024-06-13 ENCOUNTER — Encounter (HOSPITAL_COMMUNITY): Payer: Self-pay | Admitting: Internal Medicine

## 2024-06-13 ENCOUNTER — Encounter (HOSPITAL_COMMUNITY)

## 2024-06-13 ENCOUNTER — Inpatient Hospital Stay (HOSPITAL_COMMUNITY)

## 2024-06-13 ENCOUNTER — Encounter (HOSPITAL_COMMUNITY)
Admission: EM | Disposition: A | Discharge status: Skilled Nursing Facility | Payer: Self-pay | Source: Home / Self Care | Attending: Internal Medicine

## 2024-06-13 ENCOUNTER — Other Ambulatory Visit: Payer: Self-pay

## 2024-06-13 DIAGNOSIS — I130 Hypertensive heart and chronic kidney disease with heart failure and stage 1 through stage 4 chronic kidney disease, or unspecified chronic kidney disease: Secondary | ICD-10-CM | POA: Diagnosis not present

## 2024-06-13 DIAGNOSIS — I5042 Chronic combined systolic (congestive) and diastolic (congestive) heart failure: Secondary | ICD-10-CM | POA: Diagnosis not present

## 2024-06-13 DIAGNOSIS — S72001A Fracture of unspecified part of neck of right femur, initial encounter for closed fracture: Secondary | ICD-10-CM

## 2024-06-13 DIAGNOSIS — S72002A Fracture of unspecified part of neck of left femur, initial encounter for closed fracture: Secondary | ICD-10-CM | POA: Diagnosis not present

## 2024-06-13 DIAGNOSIS — R55 Syncope and collapse: Secondary | ICD-10-CM | POA: Diagnosis not present

## 2024-06-13 DIAGNOSIS — N1832 Chronic kidney disease, stage 3b: Secondary | ICD-10-CM | POA: Diagnosis not present

## 2024-06-13 DIAGNOSIS — K1379 Other lesions of oral mucosa: Secondary | ICD-10-CM | POA: Diagnosis not present

## 2024-06-13 DIAGNOSIS — D696 Thrombocytopenia, unspecified: Secondary | ICD-10-CM | POA: Diagnosis not present

## 2024-06-13 HISTORY — PX: HIP ARTHROPLASTY: SHX981

## 2024-06-13 HISTORY — PX: DIRECT LARYNGOSCOPY: SHX5326

## 2024-06-13 LAB — CBC
HCT: 26.7 % — ABNORMAL LOW (ref 39.0–52.0)
Hemoglobin: 8.5 g/dL — ABNORMAL LOW (ref 13.0–17.0)
MCH: 29.1 pg (ref 26.0–34.0)
MCHC: 31.8 g/dL (ref 30.0–36.0)
MCV: 91.4 fL (ref 80.0–100.0)
Platelets: 271 K/uL (ref 150–400)
RBC: 2.92 MIL/uL — ABNORMAL LOW (ref 4.22–5.81)
RDW: 14 % (ref 11.5–15.5)
WBC: 11.5 K/uL — ABNORMAL HIGH (ref 4.0–10.5)
nRBC: 0 % (ref 0.0–0.2)

## 2024-06-13 LAB — BASIC METABOLIC PANEL WITH GFR
Anion gap: 11 (ref 5–15)
BUN: 53 mg/dL — ABNORMAL HIGH (ref 8–23)
CO2: 23 mmol/L (ref 22–32)
Calcium: 7.8 mg/dL — ABNORMAL LOW (ref 8.9–10.3)
Chloride: 107 mmol/L (ref 98–111)
Creatinine, Ser: 2.29 mg/dL — ABNORMAL HIGH (ref 0.61–1.24)
GFR, Estimated: 29 mL/min — ABNORMAL LOW
Glucose, Bld: 106 mg/dL — ABNORMAL HIGH (ref 70–99)
Potassium: 3.9 mmol/L (ref 3.5–5.1)
Sodium: 141 mmol/L (ref 135–145)

## 2024-06-13 LAB — COMPREHENSIVE METABOLIC PANEL WITH GFR
ALT: 25 U/L (ref 0–44)
AST: 42 U/L — ABNORMAL HIGH (ref 15–41)
Albumin: 2.5 g/dL — ABNORMAL LOW (ref 3.5–5.0)
Alkaline Phosphatase: 48 U/L (ref 38–126)
Anion gap: 12 (ref 5–15)
BUN: 47 mg/dL — ABNORMAL HIGH (ref 8–23)
CO2: 21 mmol/L — ABNORMAL LOW (ref 22–32)
Calcium: 7.9 mg/dL — ABNORMAL LOW (ref 8.9–10.3)
Chloride: 107 mmol/L (ref 98–111)
Creatinine, Ser: 2.13 mg/dL — ABNORMAL HIGH (ref 0.61–1.24)
GFR, Estimated: 31 mL/min — ABNORMAL LOW (ref >60)
Glucose, Bld: 114 mg/dL — ABNORMAL HIGH (ref 70–99)
Potassium: 4 mmol/L (ref 3.5–5.1)
Sodium: 140 mmol/L (ref 135–145)
Total Bilirubin: 0.7 mg/dL (ref 0.0–1.2)
Total Protein: 6.5 g/dL (ref 6.5–8.1)

## 2024-06-13 SURGERY — HEMIARTHROPLASTY (BIPOLAR) HIP, POSTERIOR APPROACH FOR FRACTURE
Anesthesia: General

## 2024-06-13 MED ORDER — PHENYLEPHRINE HCL-NACL 20-0.9 MG/250ML-% IV SOLN
INTRAVENOUS | Status: DC | PRN
  Administered 2024-06-13: 50 ug/min via INTRAVENOUS

## 2024-06-13 MED ORDER — ACETAMINOPHEN 500 MG PO TABS
500.0000 mg | ORAL_TABLET | Freq: Four times a day (QID) | ORAL | Status: AC
  Administered 2024-06-13 – 2024-06-14 (×4): 500 mg via ORAL
  Filled 2024-06-13 (×4): qty 1

## 2024-06-13 MED ORDER — ONDANSETRON HCL 4 MG PO TABS: 4.0000 mg | ORAL_TABLET | Freq: Four times a day (QID) | ORAL | Status: DC | PRN

## 2024-06-13 MED ORDER — PHENOL 1.4 % MT LIQD: 1.0000 | OROMUCOSAL | Status: DC | PRN

## 2024-06-13 MED ORDER — EPINEPHRINE HCL (NASAL) 0.1 % NA SOLN
NASAL | Status: DC | PRN
  Administered 2024-06-13: 10 mL via TOPICAL

## 2024-06-13 MED ORDER — AMLODIPINE BESYLATE 5 MG PO TABS
5.0000 mg | ORAL_TABLET | Freq: Every day | ORAL | Status: DC
Start: 2024-06-13 — End: 2024-06-15
  Administered 2024-06-13 – 2024-06-15 (×3): 5 mg via ORAL
  Filled 2024-06-13 (×3): qty 1

## 2024-06-13 MED ORDER — FENTANYL CITRATE (PF) 250 MCG/5ML IJ SOLN
INTRAMUSCULAR | Status: DC | PRN
  Administered 2024-06-13 (×4): 50 ug via INTRAVENOUS

## 2024-06-13 MED ORDER — LIDOCAINE 2% (20 MG/ML) 5 ML SYRINGE
INTRAMUSCULAR | Status: AC
  Filled 2024-06-13: qty 5

## 2024-06-13 MED ORDER — CHLORHEXIDINE GLUCONATE 0.12 % MT SOLN
15.0000 mL | Freq: Once | OROMUCOSAL | Status: AC
  Administered 2024-06-13: 15 mL via OROMUCOSAL

## 2024-06-13 MED ORDER — SODIUM CHLORIDE 0.9 % IR SOLN
Status: DC | PRN
  Administered 2024-06-13: 1000 mL

## 2024-06-13 MED ORDER — CHLORHEXIDINE GLUCONATE 0.12 % MT SOLN
OROMUCOSAL | Status: AC
  Filled 2024-06-13: qty 15

## 2024-06-13 MED ORDER — TRANEXAMIC ACID-NACL 1000-0.7 MG/100ML-% IV SOLN
1000.0000 mg | INTRAVENOUS | Status: AC
  Administered 2024-06-13: 1000 mg via INTRAVENOUS

## 2024-06-13 MED ORDER — LIDOCAINE HCL (PF) 4 % IJ SOLN
INTRAMUSCULAR | Status: DC | PRN
  Administered 2024-06-13: 5 mL

## 2024-06-13 MED ORDER — CHLORHEXIDINE GLUCONATE 4 % EX SOLN: 1.0000 | CUTANEOUS | 1 refills | Status: DC

## 2024-06-13 MED ORDER — ALUM & MAG HYDROXIDE-SIMETH 200-200-20 MG/5ML PO SUSP: 30.0000 mL | ORAL | Status: DC | PRN

## 2024-06-13 MED ORDER — PHENYLEPHRINE 80 MCG/ML (10ML) SYRINGE FOR IV PUSH (FOR BLOOD PRESSURE SUPPORT)
PREFILLED_SYRINGE | INTRAVENOUS | Status: DC | PRN
  Administered 2024-06-13 (×2): 120 ug via INTRAVENOUS

## 2024-06-13 MED ORDER — PHENYLEPHRINE HCL-NACL 20-0.9 MG/250ML-% IV SOLN
INTRAVENOUS | Status: AC
  Filled 2024-06-13: qty 250

## 2024-06-13 MED ORDER — ROCURONIUM BROMIDE 10 MG/ML (PF) SYRINGE
PREFILLED_SYRINGE | INTRAVENOUS | Status: DC | PRN
  Administered 2024-06-13: 50 mg via INTRAVENOUS

## 2024-06-13 MED ORDER — HYDROCODONE-ACETAMINOPHEN 7.5-325 MG PO TABS
1.0000 | ORAL_TABLET | ORAL | Status: DC | PRN
  Administered 2024-06-13: 1 via ORAL
  Administered 2024-06-13: 2 via ORAL
  Filled 2024-06-13: qty 1
  Filled 2024-06-13: qty 2

## 2024-06-13 MED ORDER — KETAMINE HCL 10 MG/ML IJ SOLN
INTRAMUSCULAR | Status: DC | PRN
  Administered 2024-06-13: 10 mg via INTRAVENOUS

## 2024-06-13 MED ORDER — DOCUSATE SODIUM 100 MG PO CAPS
100.0000 mg | ORAL_CAPSULE | Freq: Two times a day (BID) | ORAL | Status: DC
  Administered 2024-06-13 – 2024-06-15 (×3): 100 mg via ORAL
  Filled 2024-06-13 (×4): qty 1

## 2024-06-13 MED ORDER — CEFAZOLIN SODIUM-DEXTROSE 2-4 GM/100ML-% IV SOLN
2.0000 g | INTRAVENOUS | Status: AC
  Administered 2024-06-13: 2 g via INTRAVENOUS

## 2024-06-13 MED ORDER — ROCURONIUM BROMIDE 10 MG/ML (PF) SYRINGE
PREFILLED_SYRINGE | INTRAVENOUS | Status: AC
  Filled 2024-06-13: qty 10

## 2024-06-13 MED ORDER — METHOCARBAMOL 500 MG PO TABS: 500.0000 mg | ORAL_TABLET | Freq: Four times a day (QID) | ORAL | Status: DC | PRN

## 2024-06-13 MED ORDER — ONDANSETRON HCL 4 MG/2ML IJ SOLN: 4.0000 mg | Freq: Four times a day (QID) | INTRAMUSCULAR | Status: DC | PRN

## 2024-06-13 MED ORDER — LIDOCAINE HCL (PF) 2 % IJ SOLN
INTRAMUSCULAR | Status: DC | PRN
  Administered 2024-06-13: 6 mL

## 2024-06-13 MED ORDER — OXYCODONE HCL 5 MG/5ML PO SOLN: 5.0000 mg | Freq: Once | ORAL | Status: DC | PRN

## 2024-06-13 MED ORDER — POVIDONE-IODINE 10 % EX SWAB
2.0000 | Freq: Once | CUTANEOUS | Status: AC
  Administered 2024-06-13: 2 via TOPICAL

## 2024-06-13 MED ORDER — ACETAMINOPHEN 10 MG/ML IV SOLN: 1000.0000 mg | Freq: Once | INTRAVENOUS | Status: DC | PRN

## 2024-06-13 MED ORDER — DEXAMETHASONE SODIUM PHOSPHATE 10 MG/ML IJ SOLN
INTRAMUSCULAR | Status: AC
  Filled 2024-06-13: qty 1

## 2024-06-13 MED ORDER — METOCLOPRAMIDE HCL 5 MG PO TABS: 5.0000 mg | ORAL_TABLET | Freq: Three times a day (TID) | ORAL | Status: DC | PRN

## 2024-06-13 MED ORDER — METOCLOPRAMIDE HCL 5 MG/ML IJ SOLN: 5.0000 mg | Freq: Three times a day (TID) | INTRAMUSCULAR | Status: DC | PRN

## 2024-06-13 MED ORDER — TRANEXAMIC ACID-NACL 1000-0.7 MG/100ML-% IV SOLN
INTRAVENOUS | Status: AC
Start: 2024-06-13 — End: 2024-06-13
  Filled 2024-06-13: qty 100

## 2024-06-13 MED ORDER — MIDAZOLAM HCL 2 MG/2ML IJ SOLN
INTRAMUSCULAR | Status: AC
  Filled 2024-06-13: qty 2

## 2024-06-13 MED ORDER — METHOCARBAMOL 1000 MG/10ML IJ SOLN: 500.0000 mg | Freq: Four times a day (QID) | INTRAMUSCULAR | Status: DC | PRN

## 2024-06-13 MED ORDER — EPINEPHRINE HCL (NASAL) 0.1 % NA SOLN
NASAL | Status: AC
  Filled 2024-06-13: qty 30

## 2024-06-13 MED ORDER — SODIUM CHLORIDE 0.9 % IV SOLN: INTRAVENOUS | Status: DC

## 2024-06-13 MED ORDER — LACTATED RINGERS IV SOLN
INTRAVENOUS | Status: DC | PRN
Start: 2024-06-13 — End: 2024-06-13

## 2024-06-13 MED ORDER — ONDANSETRON HCL 4 MG/2ML IJ SOLN
INTRAMUSCULAR | Status: AC
  Filled 2024-06-13: qty 2

## 2024-06-13 MED ORDER — HYDROCODONE-ACETAMINOPHEN 5-325 MG PO TABS
1.0000 | ORAL_TABLET | ORAL | Status: DC | PRN
  Administered 2024-06-15: 1 via ORAL
  Filled 2024-06-13: qty 1

## 2024-06-13 MED ORDER — POLYETHYLENE GLYCOL 3350 17 G PO PACK: 17.0000 g | PACK | Freq: Every day | ORAL | Status: DC | PRN

## 2024-06-13 MED ORDER — MENTHOL 3 MG MT LOZG: 1.0000 | LOZENGE | OROMUCOSAL | Status: DC | PRN

## 2024-06-13 MED ORDER — OXYCODONE HCL 5 MG PO TABS: 5.0000 mg | ORAL_TABLET | Freq: Once | ORAL | Status: DC | PRN

## 2024-06-13 MED ORDER — MIDAZOLAM HCL 2 MG/2ML IJ SOLN
INTRAMUSCULAR | Status: DC | PRN
  Administered 2024-06-13: 1 mg via INTRAVENOUS

## 2024-06-13 MED ORDER — KETAMINE HCL 50 MG/5ML IJ SOSY
PREFILLED_SYRINGE | INTRAMUSCULAR | Status: AC
Start: 2024-06-13 — End: 2024-06-13
  Filled 2024-06-13: qty 5

## 2024-06-13 MED ORDER — FENTANYL CITRATE (PF) 250 MCG/5ML IJ SOLN
INTRAMUSCULAR | Status: AC
  Filled 2024-06-13: qty 5

## 2024-06-13 MED ORDER — MORPHINE SULFATE (PF) 2 MG/ML IV SOLN: 0.5000 mg | INTRAVENOUS | Status: DC | PRN

## 2024-06-13 MED ORDER — CEFAZOLIN SODIUM-DEXTROSE 2-4 GM/100ML-% IV SOLN
2.0000 g | Freq: Once | INTRAVENOUS | Status: AC
  Administered 2024-06-13: 2 g via INTRAVENOUS
  Filled 2024-06-13: qty 100

## 2024-06-13 MED ORDER — BISACODYL 10 MG RE SUPP: 10.0000 mg | Freq: Every day | RECTAL | Status: DC | PRN

## 2024-06-13 MED ORDER — ONDANSETRON HCL 4 MG/2ML IJ SOLN
INTRAMUSCULAR | Status: DC | PRN
  Administered 2024-06-13: 4 mg via INTRAVENOUS

## 2024-06-13 MED ORDER — ORAL CARE MOUTH RINSE: 15.0000 mL | Freq: Once | OROMUCOSAL | Status: AC

## 2024-06-13 MED ORDER — MUPIROCIN 2 % EX OINT: 1.0000 | TOPICAL_OINTMENT | Freq: Two times a day (BID) | CUTANEOUS | 0 refills | Status: AC

## 2024-06-13 MED ORDER — MORPHINE SULFATE (PF) 2 MG/ML IV SOLN: 1.0000 mg | Freq: Once | INTRAVENOUS | Status: DC

## 2024-06-13 MED ORDER — ACETAMINOPHEN 325 MG PO TABS: 325.0000 mg | ORAL_TABLET | Freq: Four times a day (QID) | ORAL | Status: DC | PRN

## 2024-06-13 MED ORDER — TRANEXAMIC ACID-NACL 1000-0.7 MG/100ML-% IV SOLN
1000.0000 mg | Freq: Once | INTRAVENOUS | Status: AC
  Administered 2024-06-13: 1000 mg via INTRAVENOUS
  Filled 2024-06-13: qty 100

## 2024-06-13 MED ORDER — SUGAMMADEX SODIUM 200 MG/2ML IV SOLN
INTRAVENOUS | Status: DC | PRN
  Administered 2024-06-13: 200 mg via INTRAVENOUS

## 2024-06-13 MED ORDER — PROPOFOL 10 MG/ML IV BOLUS
INTRAVENOUS | Status: DC | PRN
Start: 2024-06-13 — End: 2024-06-13
  Administered 2024-06-13: 75 mg via INTRAVENOUS

## 2024-06-13 MED ORDER — GLYCOPYRROLATE PF 0.2 MG/ML IJ SOSY
PREFILLED_SYRINGE | INTRAMUSCULAR | Status: AC
  Filled 2024-06-13: qty 1

## 2024-06-13 MED ORDER — ENOXAPARIN SODIUM 30 MG/0.3ML IJ SOSY
30.0000 mg | PREFILLED_SYRINGE | INTRAMUSCULAR | Status: DC
  Administered 2024-06-14 – 2024-06-16 (×3): 30 mg via SUBCUTANEOUS
  Filled 2024-06-13 (×3): qty 0.3

## 2024-06-13 MED ORDER — PROPOFOL 10 MG/ML IV BOLUS
INTRAVENOUS | Status: AC
  Filled 2024-06-13: qty 20

## 2024-06-13 MED ORDER — EPINEPHRINE PF 1 MG/ML IJ SOLN
INTRAMUSCULAR | Status: AC
  Filled 2024-06-13: qty 1

## 2024-06-13 MED ORDER — CEFAZOLIN SODIUM-DEXTROSE 2-4 GM/100ML-% IV SOLN
INTRAVENOUS | Status: AC
  Filled 2024-06-13: qty 100

## 2024-06-13 MED ORDER — DEXAMETHASONE SODIUM PHOSPHATE 10 MG/ML IJ SOLN
INTRAMUSCULAR | Status: DC | PRN
  Administered 2024-06-13: 10 mg via INTRAVENOUS

## 2024-06-13 MED ORDER — ONDANSETRON HCL 4 MG/2ML IJ SOLN: 4.0000 mg | Freq: Once | INTRAMUSCULAR | Status: DC | PRN

## 2024-06-13 MED ORDER — FENTANYL CITRATE (PF) 100 MCG/2ML IJ SOLN: 25.0000 ug | INTRAMUSCULAR | Status: DC | PRN

## 2024-06-13 SURGICAL SUPPLY — 60 items
BAG COUNTER SPONGE SURGICOUNT (BAG) ×4 IMPLANT
BALLOON PULM 15 16.5 18X75 (BALLOONS) IMPLANT
BLADE SAW SGTL 73X25 THK (BLADE) ×2 IMPLANT
BRUSH SCRUB EZ PLAIN DRY (MISCELLANEOUS) ×4 IMPLANT
CANISTER SUCTION 3000ML PPV (SUCTIONS) ×2 IMPLANT
CNTNR URN SCR LID CUP LEK RST (MISCELLANEOUS) IMPLANT
COVER BACK TABLE 60X90IN (DRAPES) ×2 IMPLANT
COVER MAYO STAND STRL (DRAPES) ×2 IMPLANT
COVER SURGICAL LIGHT HANDLE (MISCELLANEOUS) ×2 IMPLANT
DRAPE HALF SHEET 40X57 (DRAPES) ×2 IMPLANT
DRAPE INCISE IOBAN 85X60 (DRAPES) ×2 IMPLANT
DRAPE SURG ORHT 6 SPLT 77X108 (DRAPES) ×4 IMPLANT
DRAPE U-SHAPE 47X51 STRL (DRAPES) ×2 IMPLANT
DRSG MEPILEX POST OP 4X12 (GAUZE/BANDAGES/DRESSINGS) IMPLANT
DRSG MEPILEX POST OP 4X8 (GAUZE/BANDAGES/DRESSINGS) ×2 IMPLANT
ELECT BLADE 6.5 EXT (BLADE) IMPLANT
ELECT CAUTERY BLADE 6.4 (BLADE) IMPLANT
ELECTRODE REM PT RTRN 9FT ADLT (ELECTROSURGICAL) ×2 IMPLANT
GAUZE 4X4 16PLY ~~LOC~~+RFID DBL (SPONGE) ×2 IMPLANT
GLOVE BIO SURGEON STRL SZ 6.5 (GLOVE) ×2 IMPLANT
GLOVE BIO SURGEON STRL SZ7.5 (GLOVE) ×2 IMPLANT
GLOVE BIO SURGEON STRL SZ8 (GLOVE) ×2 IMPLANT
GLOVE BIOGEL PI IND STRL 8 (GLOVE) ×4 IMPLANT
GLOVE SURG ORTHO LTX SZ7.5 (GLOVE) ×4 IMPLANT
GOWN STRL REUS W/ TWL LRG LVL3 (GOWN DISPOSABLE) ×2 IMPLANT
GOWN STRL REUS W/ TWL XL LVL3 (GOWN DISPOSABLE) ×2 IMPLANT
GUARD TEETH (MISCELLANEOUS) ×2 IMPLANT
HEAD FEM 4XOFST TPR 28X (Head) IMPLANT
HEAD UNIV BIP OD 28X51 (Head) IMPLANT
KIT BASIN OR (CUSTOM PROCEDURE TRAY) ×2 IMPLANT
KIT TURNOVER KIT B (KITS) ×4 IMPLANT
MANIFOLD NEPTUNE II (INSTRUMENTS) ×2 IMPLANT
NDL HYPO 25GX1X1/2 BEV (NEEDLE) IMPLANT
NDL TRANS ORAL INJECTION (NEEDLE) IMPLANT
NEEDLE HYPO 25GX1X1/2 BEV (NEEDLE) IMPLANT
NEEDLE TRANS ORAL INJECTION (NEEDLE) IMPLANT
NS IRRIG 1000ML POUR BTL (IV SOLUTION) ×4 IMPLANT
PACK TOTAL JOINT (CUSTOM PROCEDURE TRAY) ×2 IMPLANT
PAD ARMBOARD POSITIONER FOAM (MISCELLANEOUS) ×8 IMPLANT
PATTIES SURGICAL .5 X.5 (GAUZE/BANDAGES/DRESSINGS) ×2 IMPLANT
PATTIES SURGICAL .5 X3 (DISPOSABLE) IMPLANT
PILLOW ABDUCTION MEDIUM (MISCELLANEOUS) IMPLANT
POSITIONER HEAD DONUT 9IN (MISCELLANEOUS) IMPLANT
RETRIEVER SUT HEWSON (MISCELLANEOUS) ×2 IMPLANT
SET HNDPC FAN SPRY TIP SCT (DISPOSABLE) IMPLANT
SOL ANTI FOG 6CC (MISCELLANEOUS) IMPLANT
STAPLER SKIN PROX 35W (STAPLE) ×2 IMPLANT
STEM ACCOLADE SZ 6 (Hips) IMPLANT
STRIP CLOSURE SKIN 1/2X4 (GAUZE/BANDAGES/DRESSINGS) IMPLANT
SURGILUBE 2OZ TUBE FLIPTOP (MISCELLANEOUS) IMPLANT
SUT ETHILON 2 0 PSLX (SUTURE) ×4 IMPLANT
SUT MNCRL AB 4-0 PS2 18 (SUTURE) IMPLANT
SUT VIC AB 1 CT1 18XCR BRD 8 (SUTURE) ×2 IMPLANT
SUT VIC AB 1 CT1 27XBRD ANBCTR (SUTURE) ×4 IMPLANT
SUT VIC AB 2-0 CT1 TAPERPNT 27 (SUTURE) ×4 IMPLANT
SUTURE FIBERWR #2 38 T-5 BLUE (SUTURE) ×4 IMPLANT
TOWEL GREEN STERILE (TOWEL DISPOSABLE) ×2 IMPLANT
TOWEL GREEN STERILE FF (TOWEL DISPOSABLE) ×6 IMPLANT
TUBE CONNECTING 12X1/4 (SUCTIONS) ×2 IMPLANT
WATER STERILE IRR 1000ML POUR (IV SOLUTION) ×2 IMPLANT

## 2024-06-13 NOTE — Evaluation (Signed)
 Physical Therapy Evaluation Patient Details Name: Michael Chang MRN: [id number] DOB: 06-28-1946 Today's Date: 06/13/2024  History of Present Illness  78 y.o. male admitted 06/11/24 after fall out of his recliner chair at home, sustaining a mildly displaced subcapital fracture of the  left femoral head and neck junction. CT of the neck, there was a soft tissue mass along the right side of oropharynx, concerning for oropharyngeal carcinoma. Pt underwent laryngoscopy, direct with biopsy 9/13. Pt s/p left bipolar hemiarthroplasty with posterior approach 9/13. PMHx: essential HTN, chronic diastolic dysfunction, hepatitis C, with a remote history of heroin use many years ago, CKD IIIb, and CAD.   Clinical Impression  Pt admitted with above diagnosis. PTA, pt was modI for functional mobility using SPC and independent with ADLs/IADLs. He lives in an apartment at an IDL. Pt currently with functional limitations due to the deficits listed below (see PT Problem List). He required maxA for bed mobility. Pt declined to attempt standing. He is currently limited by pain and decreased activity tolerance. Pt will benefit from acute skilled PT to increase his independence and safety with mobility to allow discharge. Recommend continued inpatient follow up therapy, <3 hours/day.    If plan is discharge home, recommend the following: Two people to help with walking and/or transfers;A lot of help with bathing/dressing/bathroom;Assistance with cooking/housework;Assist for transportation;Help with stairs or ramp for entrance   Can travel by private vehicle   No    Equipment Recommendations Wheelchair (measurements PT);Wheelchair cushion (measurements PT);Rolling walker (2 wheels);BSC/3in1  Recommendations for Other Services       Functional Status Assessment Patient has had a recent decline in their functional status and demonstrates the ability to make significant improvements in function in a reasonable and  predictable amount of time.     Precautions / Restrictions Precautions Precautions: Fall Recall of Precautions/Restrictions: Impaired Precaution/Restrictions Comments: No posterior hip precautions Restrictions Weight Bearing Restrictions Per Provider Order: Yes LLE Weight Bearing Per Provider Order: Weight bearing as tolerated      Mobility  Bed Mobility Overal bed mobility: Needs Assistance Bed Mobility: Supine to Sit, Sit to Supine     Supine to sit: Max assist, HOB elevated, Used rails Sit to supine: Max assist   General bed mobility comments: Pt sat up on R side of bed with increased time. Assist to bring BLE off EOB. Pivoted using bed pad and elevated trunk. Cues for sequencing and use the bedrail. Returning to bed he controlled his trunk and PT brought BLE back into bed. Repositioned with +2 assist and bed features.    Transfers Overall transfer level: Needs assistance                 General transfer comment: Pt declined to attempt standing d/t pain.    Ambulation/Gait               General Gait Details: Unable  Stairs            Wheelchair Mobility     Tilt Bed    Modified Rankin (Stroke Patients Only)       Balance Overall balance assessment: Needs assistance Sitting-balance support: Bilateral upper extremity supported, No upper extremity supported Sitting balance-Leahy Scale: Fair Sitting balance - Comments: Pt sat EOB with CGA.                                     Pertinent Vitals/Pain Pain Assessment Pain  Assessment: 0-10 Pain Score: 6  Pain Location: L hip Pain Descriptors / Indicators: Operative site guarding, Grimacing, Discomfort, Aching Pain Intervention(s): Monitored during session, Limited activity within patient's tolerance, Patient requesting pain meds-RN notified    Home Living Family/patient expects to be discharged to:: Private residence ([facility]idence - IDL) Living Arrangements:  Alone Available Help at Discharge: Family;Available PRN/intermittently (Sister lives in [place]) Type of Home: Apartment Home Access: Level entry       Home Layout: One level Home Equipment: Grab bars - toilet;Grab bars - tub/shower;Hand held shower head;Cane - quad;Rolling Walker (2 wheels);Shower seat      Prior Function Prior Level of Function : Independent/Modified Independent             Mobility Comments: Ambulates using SPC. 1 fall leading to current admission. ADLs Comments: Indep with ADLs/IADLs. Pt doesn't drive, relies on sister for transportation.     Extremity/Trunk Assessment   Upper Extremity Assessment Upper Extremity Assessment: Defer to OT evaluation    Lower Extremity Assessment Lower Extremity Assessment: Generalized weakness;LLE deficits/detail LLE Deficits / Details: Pt POD O s/p hemiarthroplasty. Limited AROM for hip, knee, and ankle. Pt guarding against any movement attempts d/t pain. Grossly 2/5. LLE: Unable to fully assess due to pain LLE Coordination: decreased gross motor    Cervical / Trunk Assessment Cervical / Trunk Assessment: Normal  Communication   Communication Communication: Impaired Factors Affecting Communication: Hearing impaired    Cognition Arousal: Alert Behavior During Therapy: WFL for tasks assessed/performed   PT - Cognitive impairments: No apparent impairments                       PT - Cognition Comments: Pt A,Ox4 Following commands: Impaired Following commands impaired: Follows one step commands with increased time     Cueing Cueing Techniques: Verbal cues     General Comments General comments (skin integrity, edema, etc.): VSS on RA.    Exercises     Assessment/Plan    PT Assessment Patient needs continued PT services  PT Problem List Decreased strength;Decreased range of motion;Decreased activity tolerance;Decreased balance;Decreased mobility;Decreased knowledge of use of DME;Decreased  safety awareness;Decreased knowledge of precautions;Pain       PT Treatment Interventions DME instruction;Gait training;Functional mobility training;Therapeutic activities;Therapeutic exercise;Balance training;Patient/family education    PT Goals (Current goals can be found in the Care Plan section)  Acute Rehab PT Goals Patient Stated Goal: Have less pain and feel better PT Goal Formulation: With patient Time For Goal Achievement: 06/27/24 Potential to Achieve Goals: Fair    Frequency Min 2X/week     Co-evaluation               AM-PAC PT 6 Clicks Mobility  Outcome Measure Help needed turning from your back to your side while in a flat bed without using bedrails?: A Lot Help needed moving from lying on your back to sitting on the side of a flat bed without using bedrails?: A Lot Help needed moving to and from a bed to a chair (including a wheelchair)?: Total Help needed standing up from a chair using your arms (e.g., wheelchair or bedside chair)?: Total Help needed to walk in hospital room?: Total Help needed climbing 3-5 steps with a railing? : Total 6 Click Score: 8    End of Session   Activity Tolerance: Patient limited by pain Patient left: in bed;with call bell/phone within reach;with bed alarm set Nurse Communication: Mobility status;Patient requests pain meds;Need for lift equipment (recommend  maximove to transfer currently, may be able to use stedy?) PT Visit Diagnosis: Pain;Muscle weakness (generalized) (M62.81);Other abnormalities of gait and mobility (R26.89);Difficulty in walking, not elsewhere classified (R26.2) Pain - Right/Left: Left Pain - part of body: Hip    Time: 8644-8578 PT Time Calculation (min) (ACUTE ONLY): 26 min   Charges:   PT Evaluation $PT Eval Moderate Complexity: 1 Mod   PT General Charges $$ ACUTE PT VISIT: 1 Visit         Randall SAUNDERS, PT, DPT Acute Rehabilitation Services Office: [phone] Secure Chat  Preferred  Delon CHRISTELLA Callander 06/13/2024, 3:01 PM

## 2024-06-13 NOTE — Progress Notes (Signed)
 OT Cancellation Note  Patient Details Name: Michael Chang MRN: [id number] DOB: [birth year]   Cancelled Treatment:    Reason Eval/Treat Not Completed: Patient at procedure or test/ unavailable (Pt in surgery)  Kennth Mliss Helling 06/13/2024, 8:03 AM Mliss HERO, OTR/L Acute Rehabilitation Services Office: [phone]

## 2024-06-13 NOTE — Anesthesia Postprocedure Evaluation (Signed)
 Anesthesia Post Note  Patient: Michael Chang  Procedure(s) Performed: HEMIARTHROPLASTY (BIPOLAR) HIP, POSTERIOR APPROACH FOR FRACTURE (Left) LARYNGOSCOPY, DIRECT     Patient location during evaluation: PACU Anesthesia Type: General Level of consciousness: awake and alert Pain management: pain level controlled Vital Signs Assessment: post-procedure vital signs reviewed and stable Respiratory status: spontaneous breathing, nonlabored ventilation, respiratory function stable and patient connected to nasal cannula oxygen Cardiovascular status: blood pressure returned to baseline and stable Postop Assessment: no apparent nausea or vomiting Anesthetic complications: no   No notable events documented.  Last Vitals:  Vitals:   06/13/24 1215 06/13/24 1237  BP: 129/86 (!) 155/95  Pulse: 75 61  Resp: 13 19  Temp: 36.4 C 36.4 C  SpO2: 94% 94%    Last Pain:  Vitals:   06/13/24 1237  TempSrc: Oral  PainSc:                  Lynwood MARLA Cornea

## 2024-06-13 NOTE — Op Note (Signed)
 OPERATIVE NOTE  Michael Chang Date/Time of Admission: 06/11/2024 12:43 PM  CSN: [id number];FMW:[id number] Attending Provider: Odell Celinda Balo, MD Room/Bed: MCPO/NONE DOB: 1945/10/28 Age: 78 y.o.   Pre-Op Diagnosis: Oropharyngeal mass  Post-Op Diagnosis: Oropharyngeal mass  Procedure: Procedure(s): LARYNGOSCOPY, DIRECT WITH BIOPSY  Anesthesia: General  Surgeon(s): Glorine Hanratty A Bellamia Ferch, DO  Staff: Circulator: Drucella Ozell PARAS, RN Scrub Person: Jarvis Hashimoto  Implants: * No implants in log *  Specimens: Right tonsil   Complications: None  EBL: 25 ML  Condition: stable  Operative Findings:  Bulky tumor involving the right tonsil and right soft palate extending into the glossotonsillar sulcus. Base of tongue on right firm to palpation with normal overlying mucosa.  No obvious involvement of the epiglottis or hypopharynx.  Glottis normal in appearance.  Description of Operation: Once operative consent was obtained, and the surgical site confirmed with the operating room team, the patient was brought back to the operating room and general nasotracheal anesthesia was smoothly obtained by the anesthesia provider. The patient was turned over to the ENT service. An operating laryngoscope was used to directly visualize the upper airway and glottis. All anatomic areas from the oral cavity to the glottis were examined and noted to be normal with only exceptions noted in this report. Areas examined included the oropharynx, vallecula, both surfaces of the epiglottis, glottis, post cricoid region and bilateral pyriform sinuses.    Biopsies were taken of the mass and sent to pathology as permanent specimen.  Hemostasis was obtained with adrenaline soaked pledgets and suction cautery.  Bleeding was also noted from the posterior nasal cavity into the oropharynx, likely from a nasotracheal intubation.  Arista was placed in bilateral nasal cavities and the oropharynx, and copious  irrigation was placed.  Following this, adequate hemostasis was noted.  An oral gastric tube was placed into the stomach and suctioned to reduce postoperative nausea. The patient was turned back over to the anesthesia service. The patient was transferred to the PACU in stable condition.    [name], DO [facility]  06/13/2024

## 2024-06-13 NOTE — Progress Notes (Signed)
 PT Cancellation Note  Patient Details Name: Michael Chang MRN: [id number] DOB: 01/03/1946   Cancelled Treatment:    Reason Eval/Treat Not Completed: Medical issues which prohibited therapy (PT consult appreciated and chart reviewed. Pt pending surgical fixation of left hip fx. Will follow-up after procedure for PT evaluation as schedule permits.)  Randall SAUNDERS, PT, DPT Acute Rehabilitation Services Office: [phone] Secure Chat Preferred  Michael Chang 06/13/2024, 7:07 AM

## 2024-06-13 NOTE — Op Note (Signed)
 06/11/2024 - 06/13/2024  10:59 AM  PATIENT:  Michael Chang   MRN: [id number]  PRE-OPERATIVE DIAGNOSIS:  hip pain  POST-OPERATIVE DIAGNOSIS:  hip pain  PROCEDURE:  Procedure(s): HEMIARTHROPLASTY (BIPOLAR) HIP, POSTERIOR APPROACH FOR FRACTURE LARYNGOSCOPY, DIRECT  PREOPERATIVE INDICATIONS:  Michael Chang is an 78 y.o. male who was admitted 06/11/2024 with a diagnosis of Closed left hip fracture (HCC) and elected for surgical management.  The risks benefits and alternatives were discussed with the patient including but not limited to the risks of nonoperative treatment, versus surgical intervention including infection, bleeding, nerve injury, periprosthetic fracture, the need for revision surgery, dislocation, leg length discrepancy, blood clots, cardiopulmonary complications, morbidity, mortality, among others, and they were willing to proceed.  Predicted outcome is good, although there will be at least a six to nine month expected recovery.   OPERATIVE REPORT     SURGEON:  Evalene Chancy, MD    ASSISTANT:  Gerard Large, PA-C, he was present and scrubbed throughout the case, critical for completion in a timely fashion, and for retraction, instrumentation, and closure.     ANESTHESIA:  General    COMPLICATIONS:  None.      COMPONENTS:  [employer] Acolade: Femoral stem: 6, Femoral Head:51, Neck:-4   PROCEDURE IN DETAIL: The patient was met in the holding area and identified.  The appropriate hip  was marked at the operative site. The patient was then transported to the OR and  placed under general anesthesia.  At that point, the patient was  placed in the lateral decubitus position with the operative side up and  secured to the operating room table and all bony prominences padded.     The operative lower extremity was prepped from the iliac crest to the toes.  Sterile draping was performed.  Time out was performed prior to incision.      A routine posterolateral approach was utilized via  sharp dissection  carried down to the subcutaneous tissue.  Gross bleeders were Bovie  coagulated.  The iliotibial band was identified and incised  along the length of the skin incision.  Self-retaining retractors were  inserted. I examined the bursa there was significant hematoma and edema I performed a bursectomy here.  With the hip internally rotated, the short external rotators  were identified. The piriformis was tagged with FiberWire, and the hip capsule released in a T-type fashion.  The femoral neck was exposed, and I resected the femoral neck using the appropriate jig. This was performed at approximately a thumb's breadth above the lesser trochanter.    I then exposed the deep acetabulum, cleared out any tissue including the ligamentum teres, and included the hip capsule in the FiberWire used above and below the T.    I then prepared the proximal femur using the cookie-cutter, the lateralizing reamer, and then sequentially broached.  A trial utilized, and I reduced the hip and it was found to have excellent stability with functional range of motion. The trial components were then removed.   The canal and acetabulum were thoroughly irrigated  I inserted the pressfit stem and placed the head and neck collar. The hip was reduced with appropriate force and was stable through a range of motion.   I then used a 2 mm drill bits to pass the FiberWire suture from the capsule and puriform is through the greater trochanter, and secured this. Excellent posterior capsular repair was achieved. I also closed the T in the capsule.  I then irrigated the hip copiously again  with pulse lavage, and repaired the fascia with Vicryl, followed by Vicryl for the subcutaneous tissue, Monocryl for the skin, Steri-Strips and sterile gauze. The wounds were injected. The patient was then awakened and returned to PACU in stable and satisfactory condition. There were no complications.  POST-OP PLAN: Weight bearing as  tolerated. DVT px will consist of SCD's and chemical px  Evalene Chancy, MD Orthopedic Surgeon [phone]   06/13/2024 10:59 AM

## 2024-06-13 NOTE — Anesthesia Preprocedure Evaluation (Addendum)
 Anesthesia Evaluation  Patient identified by MRN, date of birth, ID band Patient awake    Reviewed: Allergy & Precautions, NPO status , Patient's Chart, lab work & pertinent test results, reviewed documented beta blocker date and time   History of Anesthesia Complications Negative for: history of anesthetic complications  Airway Mallampati: IV   Neck ROM: Full  Mouth opening: Limited Mouth Opening Comment: Muffled speech noted Dental no notable dental hx.    Pulmonary neg COPD Airway tumor - posterior/lateral oropharynx   breath sounds clear to auscultation       Cardiovascular hypertension, + CAD, + Past MI, + Peripheral Vascular Disease and +CHF   Rhythm:Regular Rate:Normal  IMPRESSIONS     1. Left ventricular ejection fraction, by estimation, is 55 to 60%. The  left ventricle has normal function. The left ventricle has no regional  wall motion abnormalities. There is severe left ventricular hypertrophy.  Left ventricular diastolic parameters   are consistent with Grade I diastolic dysfunction (impaired relaxation).   2. Right ventricular systolic function is normal. The right ventricular  size is normal.   3. Left atrial size was mildly dilated.     Neuro/Psych neg Seizures    GI/Hepatic ,,,(+) neg Cirrhosis        Endo/Other    Renal/GU CRFRenal disease     Musculoskeletal   Abdominal   Peds  Hematology  (+) Blood dyscrasia, anemia   Anesthesia Other Findings   Reproductive/Obstetrics                              Anesthesia Physical Anesthesia Plan  ASA: 3  Anesthesia Plan: General   Post-op Pain Management:    Induction: Intravenous  PONV Risk Score and Plan: 2 and Ondansetron  and Dexamethasone   Airway Management Planned: Oral ETT, Awake Intubation Planned and Fiberoptic Intubation Planned  Additional Equipment:   Intra-op Plan:   Post-operative Plan: Possible  Post-op intubation/ventilation and Extubation in OR  Informed Consent: I have reviewed the patients History and Physical, chart, labs and discussed the procedure including the risks, benefits and alternatives for the proposed anesthesia with the patient or authorized representative who has indicated his/her understanding and acceptance.   Patient has DNR.  Discussed DNR with patient and Suspend DNR.   Dental advisory given  Plan Discussed with: CRNA  Anesthesia Plan Comments: (Discussed risks of induction in setting of known large airway tumor with patient. Plan for AFOI. Additionally discussed existing DNR, he would like to suspend this for the perioperative period.)         Anesthesia Quick Evaluation

## 2024-06-13 NOTE — Anesthesia Procedure Notes (Addendum)
 Procedure Name: Awake intubation Date/Time: 06/13/2024 9:15 AM  Performed by: Keneth Lynwood POUR, MDPre-anesthesia Checklist: Patient identified, Emergency Drugs available, Suction available, Patient being monitored and Timeout performed Patient Re-evaluated:Patient Re-evaluated prior to induction Oxygen Delivery Method: Nasal cannula Induction Type: IV induction Tube type: Oral Tube size: 6.0 mm Number of attempts: 1 Airway Equipment and Method: Fiberoptic brochoscope Placement Confirmation: ETT inserted through vocal cords under direct vision and positive ETCO2 Secured at: 22 cm Tube secured with: Tape Dental Injury: Teeth and Oropharynx as per pre-operative assessment  Difficulty Due To: Difficult Airway-  due to edematous airway Future Recommendations: Recommend- awake intubation Comments: AFOI performed due to large retropharyngeal mass. Previous transnasal fiberoptic laryngoscopy by ENT demonstrating open path to glottis once past retropharyngeal mass. Airway blocks (palatoglossal GPN, thyrohyoid membrane SLN, and transtracheal RLN) performed in preop holding after light sedation. Intraoperatively, jaw thrust and tongue retraction performed by CRNAs allowing oral fiberoptic bronchoscope passage. Large, smooth mass observed in posterior inferior oropharynx; however we were able to maneuver down the side of this and then back to the midline allowing visualization of the epiglottis and then glottis. Endotracheal tube passed and +ETCO2 confirmed followed immediately by IV induction.   Following Dr. Inez DL/biopsy, she reports that she was able to achieve an adequate view of his airway via DL, however she noted that orotracheal tube passage would be challenging due to the position of the mass. Her recommendation is for future nasotracheal intubation to allow passage behind the tumor. She does believe he could be adequately mask ventilated after induction possibly allowing for anesthesia  induction prior to intubation if he requires future procedures.

## 2024-06-13 NOTE — Interval H&P Note (Signed)
 History and Physical Interval Note:  06/13/2024 9:12 AM  Michael Chang  has presented today for surgery, with the diagnosis of hip pain.  The various methods of treatment have been discussed with the patient and family. After consideration of risks, benefits and other options for treatment, the patient has consented to  Procedure(s) with comments: HEMIARTHROPLASTY (BIPOLAR) HIP, POSTERIOR APPROACH FOR FRACTURE (Left) LARYNGOSCOPY, DIRECT (N/A) - Direct Laryngoscopy with Biopsy as a surgical intervention.  The patient's history has been reviewed, patient examined, no change in status, stable for surgery.  I have reviewed the patient's chart and labs.  Questions were answered to the patient's satisfaction.     [name] A Parys Elenbaas

## 2024-06-13 NOTE — Progress Notes (Signed)
 [facility] PROGRESS NOTE    Progress Note  Michael Chang  FMW:[id number] DOB: Aug 20, 1946 DOA: 06/11/2024 PCP: Gretta Comer POUR, NP     Brief Narrative:   Michael Chang is an 78 y.o. male  past medical history of essential hypertension, chronic diastolic dysfunction, history of hepatitis C, with a remote history of heroin use many years ago, chronic kidney disease stage IIIb, coronary artery disease comes into the hospital after fall that happened 2 days prior to admission.  was sitting down he denies any loss of consciousness but he started feeling dizzy and felt from the sitting position to the left side after he fell he could not get up and spent 2 days crawling around the floor in his house, and due to the pain he called EMS was he was able to get to the phone.  He denies any loss of consciousness no change in vision and only dizziness right before the fall. He relates he is also been getting short of breath for the last several months and he has had some dysp of his voice.  And some difficulty swallowing.  Assessment/Plan:   Closed left hip fracture [facility]) Orthopedic surgery was consulted, they recommended to stabilize before proceeding with surgical intervention.  Severe thrombocytopenia: Hematology was consulted recommended to start steroids for concern of ITP. He status post 2 units of platelet counts.now resolved. Plt's 271. Hematology rec no furhter steroids.  Preop evaluation: Patient is at risk of complications due to his elevated potassium and new acute kidney injury.   PLT Count of 271k PT/INR 17/1.4.  Acute kidney injury on chronic kidney disease stage IIIb: With a baseline creatinine of 1.7-1.2 on admission 3.8. He was started on IV fluids, likely hemodynamically mediated in the setting of antihypertensive medication and decreased oral intake. Creatinine has returned to baseline.  Hyperkalemia: With a baseline creatinine 1.7-2.2, on admission his potassium  was 5.5. He was started on Lokelma  and IV fluids.  Mild traumatic rhabdomyolysis: He was started on IV fluids CK is pending this morning.  Essential hypertension Continue to hold all antihypertensive medication continue IV fluids.  Normocytic anemia: normally he runs around 12 on admission 8. Question chronic blood loss from oropharyngeal mass versus infiltrative disease. CBC shows a hemoglobin stable around 8.5.  Oropharyngeal mass: CTA of the head and neck showed a right sided oropharyngeal mass. ENT was consulted who performed a transnasal fiberoptic laryngoscopy and showed retropalatal space mass with restricted space with mass effect.  Larynx appears normal good vocal fold fold mobility and not obstruction by tumor.  Will need biopsy once platelets are improved.  History of hepatitis C: Noted.  Goals of care: Patient is a DNR/DNI. Palliative Care has been consulted.   DVT prophylaxis: SCD Family Communication:sister Status is: Inpatient Remains inpatient appropriate because: hip fracture    Code Status:     Code Status Orders  (From admission, onward)           Start     Ordered   06/11/24 1705  Do not attempt resuscitation (DNR)- Limited -Do Not Intubate (DNI)  Continuous       Question Answer Comment  If pulseless and not breathing No CPR or chest compressions.   In Pre-Arrest Conditions (Patient Is Breathing and Has A Pulse) Do not intubate. Provide all appropriate non-invasive medical interventions. Avoid ICU transfer unless indicated or required.   Consent: Discussion documented in EHR or advanced directives reviewed      06/11/24 1709  Code Status History     Date Active Date Inactive Code Status Order ID Comments User Context   06/11/2024 1645 06/11/2024 1709 Limited: Do not attempt resuscitation (DNR) -DNR-LIMITED -Do Not Intubate/DNI  [id number]  Odell Celinda Balo, MD ED   02/14/2020 0740 02/15/2020 1735 Full Code [id number]  Lanetta Lingo, MD ED         IV Access:   Peripheral IV   Procedures and diagnostic studies:   CT Soft Tissue Neck Wo Contrast Result Date: 06/11/2024 EXAM: CT NECK WITHOUT CONTRAST 06/11/2024 03:09:19 PM TECHNIQUE: CT of the neck was performed without the administration of intravenous contrast. Multiplanar reformatted images are provided for review. Automated exposure control, iterative reconstruction, and/or weight based adjustment of the mA/kV was utilized to reduce the radiation dose to as low as reasonably achievable. COMPARISON: None available. CLINICAL HISTORY: Soft tissue mass of right tonsil. FINDINGS: AERODIGESTIVE TRACT: Soft tissue mass anchored along the right side of the oropharynx measures 3.9 x 3.3 x 3.8 cm. Lesion extends into the glosso tonsillar sulcus. The left palatine tonsil was unremarkable. SALIVARY GLANDS: The left submandibular gland is unremarkable. Asymmetric right submandibular soft tissue likely reflects enlarged nodes. THYROID : Unremarkable. LYMPH NODES: No significant lower adenopathy is present. SOFT TISSUES: No mass or fluid collection. BRAIN, ORBITS, SINUSES AND MASTOIDS: No acute abnormality. LUNGS AND MEDIASTINUM: No acute abnormality. BONES: No focal bone abnormality. IMPRESSION: 1. Soft tissue mass anchored along the right side of the oropharynx, measuring 3.9 x 3.3 x 3.8 cm, extending into the glossotonsillar sulcus. 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 Asymmetric right submandibular soft tissue, likely reflecting enlarged nodes and worrisome for metastatic disease . Electronically signed by: Lonni Necessary MD 06/11/2024 03:25 PM EDT RP Workstation: [id number]   DG Hip Unilat With Pelvis 2-3 Views Left Result Date: 06/11/2024 CLINICAL DATA:  Pain after fall 2 days ago. EXAM: DG HIP (WITH OR WITHOUT PELVIS) 2-3V LEFT COMPARISON:  None Available. FINDINGS: Acute impacted and mildly displaced subcapital fracture of the left femoral head and  neck junction. No dislocation. Moderate to advanced arthritic changes of the right hip with advanced joint space narrowing. Sacroiliac joints and pubic symphysis appear anatomically aligned. IMPRESSION: 1. Acute impacted and mildly displaced subcapital fracture of the left femoral head and neck junction. 2. Moderate to advanced arthritic changes of the right hip. Electronically Signed   By: Harrietta Sherry M.D.   On: 06/11/2024 14:26   DG Knee 1-2 Views Left Result Date: 06/11/2024 CLINICAL DATA:  Pain after fall two days ago. EXAM: LEFT KNEE - 1-2 VIEW COMPARISON:  None Available. FINDINGS: No evidence of acute fracture, dislocation, or joint effusion. Moderate medial femorotibial compartment predominant osteoarthritis. Patellar enthesopathy. Soft tissues are unremarkable. IMPRESSION: 1. No acute osseous abnormality. 2. Moderate medial femorotibial compartment predominant osteoarthritis. Electronically Signed   By: Harrietta Sherry M.D.   On: 06/11/2024 14:22   CT Head Wo Contrast Result Date: 06/11/2024 EXAM: CT HEAD WITHOUT CONTRAST 06/11/2024 01:45:49 PM TECHNIQUE: CT of the head was performed without the administration of intravenous contrast. Automated exposure control, iterative reconstruction, and/or weight based adjustment of the mA/kV was utilized to reduce the radiation dose to as low as reasonably achievable. COMPARISON: CT head 02/14/2020 CLINICAL HISTORY: Polytrauma, blunt. Pt BIB EMS with CC of left hip pain s/p GLF after possible near syncopal episode 2 days ago. Pt reported he has been unable to get off the floor and spent two days crawling on floor before his sister found  him today. AOx4 FINDINGS: BRAIN AND VENTRICLES: Nonspecific hypoattenuation in the periventricular and subcortical white matter, most likely representing chronic microvascular ischemic changes. Remote lacunar infarcts in the left basal ganglia and in the bilateral corona radiata. Encephalomalacia in the medial left  occipital lobe suggestive of remote infarct. Additional small remote infarcts in the posterior left cerebellum. Mild parenchymal volume loss. No acute hemorrhage. No evidence of acute infarct. No hydrocephalus. No extra-axial collection. No mass effect or midline shift. ORBITS: Bilateral lens replacement. Elongation of the globes, particularly on the left, suggestive of axial myopia. No acute abnormality. SINUSES: Mucosal thickening in the frontal sinuses and ethmoid sinuses bilaterally. Additional mucosal thickening and possible mucous retention cysts in the inferior aspect of the maxillary sinuses. No acute abnormality. SOFT TISSUES AND SKULL: There is possible asymmetric soft tissue involving the right palatine tonsil and soft palate appreciated on series 2 image 2. No acute soft tissue abnormality. No skull fracture. IMPRESSION: 1. No acute intracranial abnormality. 2. Mild-to-moderate chronic microvascular ischemic changes. Mild parenchymal volume loss. 3. Multiple remote infarcts as above. 4. Asymmetric soft tissue involving the right palatine tonsil and soft palate, partially visualized. Recommend correlation with direct visualization and consider CT neck soft tissue with contrast for further evaluation. Electronically signed by: Donnice Mania MD 06/11/2024 02:14 PM EDT RP Workstation: [id number]   CT Cervical Spine Wo Contrast Result Date: 06/11/2024 CLINICAL DATA:  Polytrauma, blunt Near syncopal episode 2 days ago.  Found down. EXAM: CT CERVICAL SPINE WITHOUT CONTRAST TECHNIQUE: Multidetector CT imaging of the cervical spine was performed without intravenous contrast. Multiplanar CT image reconstructions were also generated. RADIATION DOSE REDUCTION: This exam was performed according to the departmental dose-optimization program which includes automated exposure control, adjustment of the mA and/or kV according to patient size and/or use of iterative reconstruction technique. COMPARISON:  None Available.  FINDINGS: Alignment: Normal. Skull base and vertebrae: No evidence of acute cervical spine fracture or traumatic subluxation. Soft tissues and spinal canal: No prevertebral fluid or swelling. No visible canal hematoma. Disc levels: Multilevel relatively mild spondylosis with disc space narrowing and uncinate spurring from C3-4 through C6-7. No evidence of large disc herniation or high-grade spinal stenosis. No more than mild foraminal narrowing identified. Upper chest: Clear lung apices. Other: Probable small mucous retention cysts in the maxillary sinuses bilaterally. Prominent mylohyoid ossification, left greater than right. IMPRESSION: 1. No evidence of acute cervical spine fracture, traumatic subluxation or static signs of instability. 2. Mild cervical spondylosis. Electronically Signed   By: Elsie Perone M.D.   On: 06/11/2024 14:04     Medical Consultants:   None.   Subjective:    Michael Chang is controlled had a bowel movement.  Objective:    Vitals:   06/12/24 0852 06/12/24 1525 06/12/24 1932 06/13/24 0357  BP: (!) 159/93 (!) 151/95 (!) 159/131 (!) 180/93  Pulse: 86 75 85 73  Resp: 16 16 17 17   Temp: 97.7 F (36.5 C)  99.1 F (37.3 C) (!) 97.5 F (36.4 C)  TempSrc: Oral  Oral   SpO2: 99% 100% 99% 98%  Weight:      Height:       SpO2: 98 %   Intake/Output Summary (Last 24 hours) at 06/13/2024 0739 Last data filed at 06/13/2024 0424 Gross per 24 hour  Intake 1489.27 ml  Output 1330 ml  Net 159.27 ml   Filed Weights   06/11/24 1306  Weight: 65 kg    Exam: General exam: In no acute distress. Respiratory system:  Good air movement and clear to auscultation. Cardiovascular system: S1 & S2 heard, RRR. No JVD. Gastrointestinal system: Abdomen is nondistended, soft and nontender.  Extremities: No pedal edema. Skin: No rashes, lesions or ulcers Psychiatry: Judgement and insight appear normal. Mood & affect appropriate. Data Reviewed:    Labs: Basic Metabolic  Panel: Recent Labs  Lab 06/11/24 1413 06/11/24 1420 06/11/24 2301 06/12/24 0453 06/13/24 0350  NA 144 145  --  146* 141  K 5.5* 5.5*  --  4.1 3.9  CL 108 112*  --  107 107  CO2 21*  --   --  23 23  GLUCOSE 85 85  --  97 106*  BUN 56* 59*  --  45* 53*  CREATININE 3.65* 3.80*  --  2.53* 2.29*  CALCIUM 8.7*  --   --  8.4* 7.8*  MG  --   --  2.2  --   --    GFR Estimated Creatinine Clearance: 24.4 mL/min (A) (by C-G formula based on SCr of 2.29 mg/dL (H)). Liver Function Tests: Recent Labs  Lab 06/11/24 1413  AST 70*  ALT 27  ALKPHOS 47  BILITOT 1.6*  PROT 7.7  ALBUMIN 2.9*   Recent Labs  Lab 06/11/24 1413  LIPASE 37   No results for input(s): AMMONIA in the last 168 hours. Coagulation profile Recent Labs  Lab 06/11/24 2301 06/12/24 0453  INR 1.4* 1.2   COVID-19 Labs  No results for input(s): DDIMER, FERRITIN, LDH, CRP in the last 72 hours.  Lab Results  Component Value Date   SARSCOV2NAA NEGATIVE 02/14/2020    CBC: Recent Labs  Lab 06/11/24 1413 06/11/24 1420 06/12/24 0453 06/12/24 0801 06/12/24 1224 06/13/24 0350  WBC 7.5  --  10.7* 9.2 9.1 11.5*  NEUTROABS 6.0  --   --   --  8.2*  --   HGB 8.9* 8.5* 9.9* 10.7* 10.1* 8.5*  HCT 27.6* 25.0* 30.6* 33.7* 31.3* 26.7*  MCV 92.9  --  90.3 91.6 89.9 91.4  PLT 5*  --  343 367 353 271   Cardiac Enzymes: Recent Labs  Lab 06/11/24 1413 06/12/24 0801  CKTOTAL 1,667* 784*   BNP (last 3 results) No results for input(s): PROBNP in the last 8760 hours. CBG: No results for input(s): GLUCAP in the last 168 hours. D-Dimer: No results for input(s): DDIMER in the last 72 hours. Hgb A1c: No results for input(s): HGBA1C in the last 72 hours. Lipid Profile: No results for input(s): CHOL, HDL, LDLCALC, TRIG, CHOLHDL, LDLDIRECT in the last 72 hours. Thyroid  function studies: No results for input(s): TSH, T4TOTAL, T3FREE, THYROIDAB in the last 72 hours.  Invalid  input(s): FREET3 Anemia work up: No results for input(s): VITAMINB12, FOLATE, FERRITIN, TIBC, IRON, RETICCTPCT in the last 72 hours. Sepsis Labs: Recent Labs  Lab 06/12/24 0453 06/12/24 0801 06/12/24 1224 06/13/24 0350  WBC 10.7* 9.2 9.1 11.5*   Microbiology Recent Results (from the past 240 hours)  Surgical pcr screen     Status: Abnormal   Collection Time: 06/11/24  6:14 PM   Specimen: Nasal Mucosa; Nasal Swab  Result Value Ref Range Status   MRSA, PCR NEGATIVE NEGATIVE Final   Staphylococcus aureus POSITIVE (A) NEGATIVE Final    Comment: (NOTE) The Xpert SA Assay (FDA approved for NASAL specimens in patients 78 years of age and older), is one component of a comprehensive surveillance program. It is not intended to diagnose infection nor to guide or monitor treatment. Performed at [facility], [address] [place], [place] [address]      Medications:    sodium chloride    Intravenous Once   sodium chloride    Intravenous Once    morphine  injection  1 mg Intravenous Once   polyethylene glycol  17 g Oral BID   Continuous Infusions:      LOS: 2 days   Erle Odell Castor  [facility]  06/13/2024, 7:39 AM

## 2024-06-13 NOTE — Transfer of Care (Signed)
 Immediate Anesthesia Transfer of Care Note  Patient: Michael Chang  Procedure(s) Performed: HEMIARTHROPLASTY (BIPOLAR) HIP, POSTERIOR APPROACH FOR FRACTURE (Left) LARYNGOSCOPY, DIRECT  Patient Location: PACU  Anesthesia Type:General  Level of Consciousness: awake, alert , and oriented  Airway & Oxygen Therapy: Patient Spontanous Breathing  Post-op Assessment: Report given to RN and Post -op Vital signs reviewed and stable  Post vital signs: Reviewed and stable  Last Vitals:  Vitals Value Taken Time  BP 199/91 06/13/24 11:42  Temp    Pulse 65 06/13/24 11:46  Resp 10 06/13/24 11:46  SpO2 94 % 06/13/24 11:46  Vitals shown include unfiled device data.  Last Pain:  Vitals:   06/13/24 0752  TempSrc: Oral  PainSc:          Complications: No notable events documented.

## 2024-06-13 NOTE — Progress Notes (Signed)
 Patient's sister brought his cane and rollator / walker from home.

## 2024-06-13 NOTE — Discharge Instructions (Signed)
[facility] POST OP INSTRUCTIONS: LARYNGOSCOPY  The Surgery Itself Laryngoscopy with biopsy or injection involves a brief general anesthesia, typically for  less than one hour. Patients may be sedated for several hours after surgery and may  remain sleepy for the better part of the day. Nausea and vomiting are occasionally seen,  and usually resolve by the evening of surgery - even without additional medications.  Almost all patients can go home the day of surgery.  After Surgery ? You will have a sore throat from the metal instruments used to allow a good view  of your voice box for 3-5 days after surgery. Some patients may have sores on  the tongue or in the mouth. This is normal and you will be given pain  medications for this. If you have sores in the mouth, avoid citrus or acidic  foods/drinks since they will burn.  ? Avoid coughing or frequent throat clearing. Drinking water can help alleviate the  urge to clear the throat. ? Avoid any heavy lifting (more than 20 lbs), straining, exercise, or sports activities  for 2 weeks after surgery. ? Avoid alcohol, tobacco products, spicy foods, or eating late at night as these may  cause heartburn or stomach reflux and may delay the healing process. ? Your voice may be hoarse after surgery from swelling of the vocal cords caused  by manipulation. ? You do not have to avoid talking unless your surgeon gives you specific  instructions. Use your normal voice since whispering is harder on your vocal  cords then talking at a regular volume. ? If your surgeon advises voice rest, you should do the following: o You are to have absolute voice rest for 7 days. You may want to purchase  a dry erase board to communicate during this time. After that, you may  begin to use your voice at a soft spoken level. o You should only speak loud enough for people to hear you that are within  an arm's length away. Do not yell or whisper. You may increase your   voice use by 5 minutes/hour each day after the first 7 days of rest.  Medications ? Pain medication can be used for pain as prescribed. Pain in the throat and the  tongue is normal. ? Some patients will be given steroids or acid reducing medications after surgery,  take these as directed. ? Take all of your routine medications as prescribed, unless told otherwise by your  surgeon. Any medications that thin the blood should be avoided unless approved  by your surgeon.  ? IT IS OK TO TAKE OVER THE COUNTER PAIN MEDICATION  (IBUPROFEN, NAPROXEN, or ACETAMINOPHEN) IN ADDITION TO  YOUR PRESCRIBED MEDICATIONS. DO NOT TAKE ASPIRIN UNLESS  CLEARED WITH YOUR SURGEON.  ? Limit Acetaminophen/Tylenol to less than 4,000mg/day  ? Limit Ibuprofen/Motrin to less than 3,600mg/day  Final Result ? Following vocal cord injection, the voice may seem "strangled" due to swelling for  a few days or weeks. This is normal.  ? If you have a biopsy in surgery, you will usually find out the pathology results  when you are seen in the office for follow up. ? Many patients benefit from voice therapy after surgery to improve the long term  result. Your surgeon will recommend this if you could benefit from it  

## 2024-06-13 NOTE — Interval H&P Note (Signed)
 History and Physical Interval Note:  06/13/2024 8:03 AM  Michael Chang  has presented today for surgery, with the diagnosis of hip pain.  The various methods of treatment have been discussed with the patient and family. After consideration of risks, benefits and other options for treatment, the patient has consented to  Procedure(s) with comments: HEMIARTHROPLASTY (BIPOLAR) HIP, POSTERIOR APPROACH FOR FRACTURE (Left) LARYNGOSCOPY, DIRECT (N/A) - Direct Laryngoscopy with Biopsy as a surgical intervention.  The patient's history has been reviewed, patient examined, no change in status, stable for surgery.  I have reviewed the patient's chart and labs.  Questions were answered to the patient's satisfaction.     Michael Chang

## 2024-06-14 DIAGNOSIS — K1379 Other lesions of oral mucosa: Secondary | ICD-10-CM | POA: Diagnosis not present

## 2024-06-14 DIAGNOSIS — R55 Syncope and collapse: Secondary | ICD-10-CM | POA: Diagnosis not present

## 2024-06-14 DIAGNOSIS — D696 Thrombocytopenia, unspecified: Secondary | ICD-10-CM | POA: Diagnosis not present

## 2024-06-14 DIAGNOSIS — S72002A Fracture of unspecified part of neck of left femur, initial encounter for closed fracture: Secondary | ICD-10-CM | POA: Diagnosis not present

## 2024-06-14 LAB — BASIC METABOLIC PANEL WITH GFR
Anion gap: 11 (ref 5–15)
BUN: 55 mg/dL — ABNORMAL HIGH (ref 8–23)
CO2: 26 mmol/L (ref 22–32)
Calcium: 7.9 mg/dL — ABNORMAL LOW (ref 8.9–10.3)
Chloride: 100 mmol/L (ref 98–111)
Creatinine, Ser: 2.73 mg/dL — ABNORMAL HIGH (ref 0.61–1.24)
GFR, Estimated: 23 mL/min — ABNORMAL LOW (ref >60)
Glucose, Bld: 108 mg/dL — ABNORMAL HIGH (ref 70–99)
Potassium: 3.7 mmol/L (ref 3.5–5.1)
Sodium: 137 mmol/L (ref 135–145)

## 2024-06-14 LAB — CBC
HCT: 27.5 % — ABNORMAL LOW (ref 39.0–52.0)
Hemoglobin: 8.7 g/dL — ABNORMAL LOW (ref 13.0–17.0)
MCH: 28.6 pg (ref 26.0–34.0)
MCHC: 31.6 g/dL (ref 30.0–36.0)
MCV: 90.5 fL (ref 80.0–100.0)
Platelets: 240 K/uL (ref 150–400)
RBC: 3.04 MIL/uL — ABNORMAL LOW (ref 4.22–5.81)
RDW: 14 % (ref 11.5–15.5)
WBC: 11.5 K/uL — ABNORMAL HIGH (ref 4.0–10.5)
nRBC: 0 % (ref 0.0–0.2)

## 2024-06-14 MED ORDER — POLYETHYLENE GLYCOL 3350 17 G PO PACK
17.0000 g | PACK | Freq: Two times a day (BID) | ORAL | Status: AC
  Administered 2024-06-14 – 2024-06-15 (×2): 17 g via ORAL
  Filled 2024-06-14 (×2): qty 1

## 2024-06-14 NOTE — Progress Notes (Signed)
 [facility] PROGRESS NOTE    Progress Note  Michael Chang  FMW:[id number] DOB: 08/12/1946 DOA: 06/11/2024 PCP: Gretta Comer POUR, NP     Brief Narrative:   Michael Chang is an 78 y.o. male  past medical history of essential hypertension, chronic diastolic dysfunction, history of hepatitis C, with a remote history of heroin use many years ago, chronic kidney disease stage IIIb, coronary artery disease comes into the hospital after fall that happened 2 days prior to admission.  was sitting down he denies any loss of consciousness but he started feeling dizzy and felt from the sitting position to the left side after he fell he could not get up and spent 2 days crawling around the floor in his house, and due to the pain he called EMS was he was able to get to the phone.  He denies any loss of consciousness no change in vision and only dizziness right before the fall. He relates he is also been getting short of breath for the last several months and he has had some dysp of his voice.  And some difficulty swallowing.  Assessment/Plan:   Closed left hip fracture [facility]) Orthopedic surgery was consulted, status post hemiarthroplasty. Out of bed to chair, PT OT to, weightbearing as tolerated. Continue narcotics for pain control. Started on a bowel regimen. Anticipate will need skilled nursing facility  Severe thrombocytopenia: He status post 2 units of platelet counts.now resolved. Plt's 271. Question if initial lab was erroneous.  Preop evaluation: Patient is at risk of complications due to his elevated potassium and new acute kidney injury.   PLT Count of 271k PT/INR 17/1.4.  Acute kidney injury on chronic kidney disease stage IIIb: With a baseline creatinine of 1.7-1.2 on admission 3.8. He was started on IV fluids, likely hemodynamically mediated in the setting of antihypertensive medication and decreased oral intake. Creatinine has returned to baseline.  Hyperkalemia: With a  baseline creatinine 1.7-2.2, on admission his potassium was 5.5. Resolved with IV fluid and Lokelma .  Mild traumatic rhabdomyolysis: Resolved with IV fluids.  Essential hypertension Continue to hold all antihypertensive medication continue IV fluids.  Normocytic anemia: normally he runs around 12 on admission 8. Question chronic blood loss from oropharyngeal mass versus infiltrative disease. CBC shows a hemoglobin stable around 8.5.  Oropharyngeal mass: CTA of the head and neck showed a right sided oropharyngeal mass. ENT was consulted who performed a transnasal fiberoptic laryngoscopy and showed retropalatal space mass with restricted space with mass effect.  Status post laryngoscopy with biopsy of the mass on 06/14/2024.  History of hepatitis C: Noted.  Goals of care: Patient is a DNR/DNI. Palliative Care has been consulted.   DVT prophylaxis: SCD Family Communication:sister Status is: Inpatient Remains inpatient appropriate because: hip fracture    Code Status:     Code Status Orders  (From admission, onward)           Start     Ordered   06/11/24 1705  Do not attempt resuscitation (DNR)- Limited -Do Not Intubate (DNI)  Continuous       Question Answer Comment  If pulseless and not breathing No CPR or chest compressions.   In Pre-Arrest Conditions (Patient Is Breathing and Has A Pulse) Do not intubate. Provide all appropriate non-invasive medical interventions. Avoid ICU transfer unless indicated or required.   Consent: Discussion documented in EHR or advanced directives reviewed      06/11/24 1709           Code Status  History     Date Active Date Inactive Code Status Order ID Comments User Context   06/11/2024 1645 06/11/2024 1709 Limited: Do not attempt resuscitation (DNR) -DNR-LIMITED -Do Not Intubate/DNI  [id number]  Odell Celinda Balo, MD ED   02/14/2020 0740 02/15/2020 1735 Full Code [id number]  Lanetta Lingo, MD ED         IV Access:    Peripheral IV   Procedures and diagnostic studies:   No results found.    Medical Consultants:   None.   Subjective:    Michael Chang pain is controlled had a bowel movement.  Objective:    Vitals:   06/13/24 1237 06/13/24 1436 06/13/24 2019 06/14/24 0445  BP: (!) 155/95 (!) 149/91 (!) 110/98 134/85  Pulse: 61 75 (!) 110 67  Resp: 19 18 17 15   Temp: 97.6 F (36.4 C) 97.9 F (36.6 C) 98 F (36.7 C) 98.4 F (36.9 C)  TempSrc: Oral Oral Oral Oral  SpO2: 94% 96% 91% (!) 79%  Weight:      Height:       SpO2: (!) 79 % O2 Flow Rate (L/min): 2 L/min   Intake/Output Summary (Last 24 hours) at 06/14/2024 0802 Last data filed at 06/13/2024 1124 Gross per 24 hour  Intake --  Output 100 ml  Net -100 ml   Filed Weights   06/11/24 1306 06/13/24 0800  Weight: 65 kg 65 kg    Exam: General exam: In no acute distress. Respiratory system: Good air movement and clear to auscultation. Cardiovascular system: S1 & S2 heard, RRR. No JVD. Gastrointestinal system: Abdomen is nondistended, soft and nontender.  Extremities: No pedal edema. Skin: No rashes, lesions or ulcers Psychiatry: Judgement and insight appear normal. Mood & affect appropriate. Data Reviewed:    Labs: Basic Metabolic Panel: Recent Labs  Lab 06/11/24 1413 06/11/24 1420 06/11/24 2301 06/12/24 0453 06/13/24 0350 06/13/24 1254  NA 144 145  --  146* 141 140  K 5.5* 5.5*  --  4.1 3.9 4.0  CL 108 112*  --  107 107 107  CO2 21*  --   --  23 23 21*  GLUCOSE 85 85  --  97 106* 114*  BUN 56* 59*  --  45* 53* 47*  CREATININE 3.65* 3.80*  --  2.53* 2.29* 2.13*  CALCIUM 8.7*  --   --  8.4* 7.8* 7.9*  MG  --   --  2.2  --   --   --    GFR Estimated Creatinine Clearance: 26.3 mL/min (A) (by C-G formula based on SCr of 2.13 mg/dL (H)). Liver Function Tests: Recent Labs  Lab 06/11/24 1413 06/13/24 1254  AST 70* 42*  ALT 27 25  ALKPHOS 47 48  BILITOT 1.6* 0.7  PROT 7.7 6.5  ALBUMIN 2.9* 2.5*    Recent Labs  Lab 06/11/24 1413  LIPASE 37   No results for input(s): AMMONIA in the last 168 hours. Coagulation profile Recent Labs  Lab 06/11/24 2301 06/12/24 0453  INR 1.4* 1.2   COVID-19 Labs  No results for input(s): DDIMER, FERRITIN, LDH, CRP in the last 72 hours.  Lab Results  Component Value Date   SARSCOV2NAA NEGATIVE 02/14/2020    CBC: Recent Labs  Lab 06/11/24 1413 06/11/24 1420 06/12/24 0453 06/12/24 0801 06/12/24 1224 06/13/24 0350  WBC 7.5  --  10.7* 9.2 9.1 11.5*  NEUTROABS 6.0  --   --   --  8.2*  --   HGB 8.9* 8.5* 9.9*  10.7* 10.1* 8.5*  HCT 27.6* 25.0* 30.6* 33.7* 31.3* 26.7*  MCV 92.9  --  90.3 91.6 89.9 91.4  PLT 5*  --  343 367 353 271   Cardiac Enzymes: Recent Labs  Lab 06/11/24 1413 06/12/24 0801  CKTOTAL 1,667* 784*   BNP (last 3 results) No results for input(s): PROBNP in the last 8760 hours. CBG: No results for input(s): GLUCAP in the last 168 hours. D-Dimer: No results for input(s): DDIMER in the last 72 hours. Hgb A1c: No results for input(s): HGBA1C in the last 72 hours. Lipid Profile: No results for input(s): CHOL, HDL, LDLCALC, TRIG, CHOLHDL, LDLDIRECT in the last 72 hours. Thyroid  function studies: No results for input(s): TSH, T4TOTAL, T3FREE, THYROIDAB in the last 72 hours.  Invalid input(s): FREET3 Anemia work up: No results for input(s): VITAMINB12, FOLATE, FERRITIN, TIBC, IRON, RETICCTPCT in the last 72 hours. Sepsis Labs: Recent Labs  Lab 06/12/24 0453 06/12/24 0801 06/12/24 1224 06/13/24 0350  WBC 10.7* 9.2 9.1 11.5*   Microbiology Recent Results (from the past 240 hours)  Surgical pcr screen     Status: Abnormal   Collection Time: 06/11/24  6:14 PM   Specimen: Nasal Mucosa; Nasal Swab  Result Value Ref Range Status   MRSA, PCR NEGATIVE NEGATIVE Final   Staphylococcus aureus POSITIVE (A) NEGATIVE Final    Comment: (NOTE) The Xpert SA Assay (FDA  approved for NASAL specimens in patients 43 years of age and older), is one component of a comprehensive surveillance program. It is not intended to diagnose infection nor to guide or monitor treatment. Performed at [facility], [address] [place], [place] [address]      Medications:    sodium chloride    Intravenous Once   sodium chloride    Intravenous Once   amLODipine   5 mg Oral Daily   docusate sodium   100 mg Oral BID   enoxaparin  (LOVENOX ) injection  30 mg Subcutaneous Q24H   Continuous Infusions:      LOS: 3 days   Erle Odell Castor  [facility]  06/14/2024, 8:02 AM

## 2024-06-14 NOTE — Progress Notes (Signed)
 OT Cancellation Note  Patient Details Name: Michael Chang MRN: [id number] DOB: Jan 16, 1946   Cancelled Treatment:    Reason Eval/Treat Not Completed: (P) Other (comment), Pt with SW earlier, then eating lunch when checked back, asked to return later. Will check back as time allows  [name] 06/14/2024, 2:01 PM

## 2024-06-14 NOTE — Progress Notes (Addendum)
     Subjective: 1 Day Post-Op s/p Procedure(s): HEMIARTHROPLASTY (BIPOLAR) HIP, POSTERIOR APPROACH FOR FRACTURE LARYNGOSCOPY, DIRECT   Patient is alert, oriented. Reports pain at left hip severe with movement, manageable at rest.  Voiding on own,  no Bowel movement  Denies chest pain, SOB, Calf pain. No nausea/vomiting.  No other complaints.    Objective:  PE: VITALS:   Vitals:   06/13/24 1237 06/13/24 1436 06/13/24 2019 06/14/24 0445  BP: (!) 155/95 (!) 149/91 (!) 110/98 134/85  Pulse: 61 75 (!) 110 67  Resp: 19 18 17 15   Temp: 97.6 F (36.4 C) 97.9 F (36.6 C) 98 F (36.7 C) 98.4 F (36.9 C)  TempSrc: Oral Oral Oral Oral  SpO2: 94% 96% 91% (!) 79%  Weight:      Height:        Sensation intact distally Intact pulses distally Dorsiflexion/Plantar flexion intact Incision: dressing C/D/I  LABS  Results for orders placed or performed during the hospital encounter of 06/11/24 (from the past 24 hours)  Comprehensive metabolic panel     Status: Abnormal   Collection Time: 06/13/24 12:54 PM  Result Value Ref Range   Sodium 140 135 - 145 mmol/L   Potassium 4.0 3.5 - 5.1 mmol/L   Chloride 107 98 - 111 mmol/L   CO2 21 (L) 22 - 32 mmol/L   Glucose, Bld 114 (H) 70 - 99 mg/dL   BUN 47 (H) 8 - 23 mg/dL   Creatinine, Ser 7.86 (H) 0.61 - 1.24 mg/dL   Calcium 7.9 (L) 8.9 - 10.3 mg/dL   Total Protein 6.5 6.5 - 8.1 g/dL   Albumin 2.5 (L) 3.5 - 5.0 g/dL   AST 42 (H) 15 - 41 U/L   ALT 25 0 - 44 U/L   Alkaline Phosphatase 48 38 - 126 U/L   Total Bilirubin 0.7 0.0 - 1.2 mg/dL   GFR, Estimated 31 (L) >60 mL/min   Anion gap 12 5 - 15    No results found.  Today's  total administered Morphine  Milligram Equivalents: 0 Yesterday's total administered Morphine  Milligram Equivalents: 82.5  Assessment/Plan: Principal Problem:   Closed left hip fracture (HCC) Active Problems:   Chronic kidney disease, stage IIIb (moderate) (HCC)   Essential hypertension   Normocytic anemia    Hyperkalemia   AKI (acute kidney injury) (HCC)   Thrombocytopenia (HCC)   Oropharyngeal mass   Mass of oropharynx    1 Day Post-Op s/p Procedure(s): HEMIARTHROPLASTY (BIPOLAR) HIP, POSTERIOR APPROACH FOR FRACTURE LARYNGOSCOPY, DIRECT  Weightbearing: WBAT LLE Insicional and dressing care: Reinforce dressings as needed VTE prophylaxis: lovenox  30 mg q 24 hrs, Hbg trending up to 8.7 this morning  Pain control: continue current regimen Follow - up plan: 2 weeks with Dr. Beverley Dispo: PT recommending SNF, TOC on board   Contact information:   Army Daring, PA-C Weekdays 8-5  After hours and holidays please check [email or web address] for group call information for [facility]  Army MARLA Daring 06/14/2024, 7:51 AM

## 2024-06-14 NOTE — Evaluation (Signed)
 Clinical/Bedside Swallow Evaluation Patient Details  Name: Michael Chang MRN: [id number] Date of Birth: 11-Jan-1946  Today's Date: 06/14/2024 Time: SLP Start Time (ACUTE ONLY): 1605 SLP Stop Time (ACUTE ONLY): 1620 SLP Time Calculation (min) (ACUTE ONLY): 15 min  Past Medical History:  Past Medical History:  Diagnosis Date   Anemia    Anemia in chronic kidney disease 03/02/2020   CAD (coronary artery disease)    Cataract (lens) fragments in eye following cataract surgery, right eye 03/15/2020   Chronic combined systolic (congestive) and diastolic (congestive) heart failure (HCC)    Chronic ulcer of right lower extremity (HCC)    CKD (chronic kidney disease), stage III (HCC)    Essential hypertension    HLD (hyperlipidemia)    Near syncope 02/14/2020   NSTEMI (non-ST elevated myocardial infarction) (HCC)    PVD (peripheral vascular disease) (HCC)    Past Surgical History:  Past Surgical History:  Procedure Laterality Date   COLONOSCOPY     x2   HPI:  78 y.o. male admitted 06/11/24 after fall out of his recliner chair at home, sustaining a mildly displaced subcapital fracture of the  left femoral head and neck junction. CT of the neck, there was a soft tissue mass along the right side of oropharynx, concerning for oropharyngeal carcinoma. Pt underwent laryngoscopy, direct with biopsy 9/13. Pt s/p left bipolar hemiarthroplasty with posterior approach 9/13. PMHx: essential HTN, chronic diastolic dysfunction, hepatitis C, with a remote history of heroin use many years ago, CKD IIIb, and CAD.    Assessment / Plan / Recommendation  Clinical Impression  Pt was seen for a clinical swallow evaluation and presents with suspected oropharyngeal dysphagia.  Pt exhibited s/sx of aspiration with thin liquid intake c/b an immediate cough in 3/3 trials.  No overt s/sx of aspiration were observed with nectar-thick liquids or puree trials.  Recommend an instrumental swallow study to further evaluate  swallow function given oropharyngeal mass.  Recommend full liquids (nectar-thick liquids) at this time until instrumental swallow study is completed. SLP Visit Diagnosis: Dysphagia, oropharyngeal phase (R13.12)    Aspiration Risk  Mild aspiration risk;Moderate aspiration risk    Diet Recommendation Nectar-thick liquid (Full liquids)    Liquid Administration via: Cup;Straw Medication Administration: Whole meds with puree Supervision: Patient able to self feed Compensations: Slow rate;Small sips/bites    Other  Recommendations Oral Care Recommendations: Oral care BID Caregiver Recommendations: Avoid jello, ice cream, thin soups, popsicles;Remove water pitcher;Have oral suction available     Assistance Recommended at Discharge    Functional Status Assessment Patient has had a recent decline in their functional status and demonstrates the ability to make significant improvements in function in a reasonable and predictable amount of time.  Frequency and Duration min 2x/week  2 weeks       Prognosis Prognosis for improved oropharyngeal function: Good      Swallow Study   General Date of Onset: 06/14/24 HPI: 78 y.o. male admitted 06/11/24 after fall out of his recliner chair at home, sustaining a mildly displaced subcapital fracture of the  left femoral head and neck junction. CT of the neck, there was a soft tissue mass along the right side of oropharynx, concerning for oropharyngeal carcinoma. Pt underwent laryngoscopy, direct with biopsy 9/13. Pt s/p left bipolar hemiarthroplasty with posterior approach 9/13. PMHx: essential HTN, chronic diastolic dysfunction, hepatitis C, with a remote history of heroin use many years ago, CKD IIIb, and CAD. Type of Study: Bedside Swallow Evaluation Previous Swallow Assessment: none Diet  Prior to this Study: Clear liquid diet Temperature Spikes Noted: No Respiratory Status: Room air History of Recent Intubation: Yes (for procedure) Total duration of  intubation (days):  (<1) Date extubated: 06/13/24 Behavior/Cognition: Alert;Cooperative;Pleasant mood Oral Cavity Assessment: Within Functional Limits Oral Care Completed by SLP: No Oral Cavity - Dentition: Missing dentition;Poor condition Vision: Functional for self-feeding Self-Feeding Abilities: Able to feed self Baseline Vocal Quality: Other (comment) (appeared hyponasal) Volitional Cough: Strong Volitional Swallow: Able to elicit    Oral/Motor/Sensory Function Overall Oral Motor/Sensory Function: Within functional limits   Ice Chips Ice chips: Not tested   Thin Liquid Thin Liquid: Impaired Presentation: Straw Pharyngeal  Phase Impairments: Cough - Immediate    Nectar Thick Nectar Thick Liquid: Within functional limits Presentation: Straw;Cup   Honey Thick Honey Thick Liquid: Not tested   Puree Puree: Within functional limits Presentation: Spoon   Solid     Solid: Not tested     Earnie Cable, M.S., CCC-SLP Acute Rehabilitation Services Office: [phone]  Earnie SQUIBB Chrishelle Zito 06/14/2024,4:38 PM

## 2024-06-14 NOTE — TOC Initial Note (Signed)
 Transition of Care [facility]) - Initial/Assessment Note    Patient Details  Name: Riddick Nuon MRN: [id number] Date of Birth: May 31, 1946  [facility] CM/SW Contact:    Montie LOISE Louder, LCSW Phone Number: 06/14/2024, 1:09 PM  Clinical Narrative:       CSW met with patient at bedside. CSW introduced self and explained role. CSW informed patient of recommendation of short term rehab at [facility]. Patient reports he lives home alone and needs rehab. Patient expressed his goal for rehab is  to be able  to get around like I use to. Patient is agreeable to rehab at [facility]. CSW explained the SNF process. No preferred SNF at this time. All questions answered.   TOC will provide bed offers once available.  Montie Louder, MSW, LCSW Clinical Social Worker                 Expected Discharge Plan: Skilled Nursing Facility Barriers to Discharge: English as a second language teacher, Continued Medical Work up, SNF Pending bed offer   Patient Goals and CMS Choice            Expected Discharge Plan and Services In-house Referral: Clinical Social Work     Living arrangements for the past 2 months: Apartment                                      Prior Living Arrangements/Services Living arrangements for the past 2 months: Apartment Lives with:: Self Patient language and need for interpreter reviewed:: No        Need for Family Participation in Patient Care: Yes (Comment) Care giver support system in place?: Yes (comment)   Criminal Activity/Legal Involvement Pertinent to Current Situation/Hospitalization: No - Comment as needed  Activities of Daily Living   ADL Screening (condition at time of admission) Independently performs ADLs?: No Does the patient have a NEW difficulty with bathing/dressing/toileting/self-feeding that is expected to last >3 days?: Yes (Initiates electronic notice to provider for possible OT consult) Does the patient have a NEW difficulty with getting in/out  of bed, walking, or climbing stairs that is expected to last >3 days?: Yes (Initiates electronic notice to provider for possible PT consult) Does the patient have a NEW difficulty with communication that is expected to last >3 days?: No Is the patient deaf or have difficulty hearing?: No Does the patient have difficulty seeing, even when wearing glasses/contacts?: No Does the patient have difficulty concentrating, remembering, or making decisions?: No  Permission Sought/Granted Permission sought to share information with : Family Supports Permission granted to share information with : Yes, Verbal Permission Granted  Share Information with NAME: Syris Brookens  Permission granted to share info w AGENCY: SNFs  Permission granted to share info w Relationship: sister  Permission granted to share info w Contact Information: [phone]  Emotional Assessment Appearance:: Appears stated age Attitude/Demeanor/Rapport: Engaged Affect (typically observed): Accepting, Pleasant, Appropriate Orientation: : Oriented to Self, Oriented to Place, Oriented to  Time, Oriented to Situation Alcohol / Substance Use: Not Applicable Psych Involvement: No (comment)  Admission diagnosis:  Syncope and collapse [R55] Thrombocytopenia (HCC) [D69.6] Mass of mouth [K13.79] Closed left hip fracture (HCC) [S72.002A] Closed fracture of left hip, initial encounter [facility]) [S72.002A] Patient Active Problem List   Diagnosis Date Noted   Mass of oropharynx 06/12/2024   Closed left hip fracture (HCC) 06/11/2024   Hyperkalemia 06/11/2024   AKI (acute kidney injury) (HCC) 06/11/2024  Thrombocytopenia (HCC) 06/11/2024   Oropharyngeal mass 06/11/2024   Cerumen impaction 03/05/2024   Elevated PSA 02/28/2023   Preventative health care 02/10/2021   Retinal holes, right 03/19/2020   Secondary corneal edema, right 03/15/2020   Aphakia of eye, right 03/15/2020   Normocytic anemia 03/02/2020   Proteinuria 03/02/2020   Iron  deficiency anemia    Weakness    Cardiomyopathy, ischemic 02/14/2020   Skin ulcer of right foot (HCC) 01/07/2020   CAD (coronary artery disease) 01/07/2020   History of non-ST elevation myocardial infarction (NSTEMI) 01/05/2020   Congestive heart failure (HCC) 01/05/2020   PAD (peripheral artery disease) (HCC) 01/05/2020   Chronic ulcer of right lower extremity with fat layer exposed (HCC) 01/05/2020   Chronic kidney disease, stage IIIb (moderate) (HCC) 01/05/2020   Essential hypertension 01/05/2020   Elevated TSH 01/05/2020   PCP:  Gretta Comer POUR, NP Pharmacy:   [facility] - [place], [place] - [address] [place] [address] Phone: [phone] Fax: [phone]     Social Drivers of Health (SDOH) Social History: SDOH Screenings   Food Insecurity: No Food Insecurity (06/12/2024)  Housing: Low Risk  (06/12/2024)  Transportation Needs: No Transportation Needs (06/12/2024)  Utilities: Not At Risk (06/12/2024)  Alcohol Screen: Low Risk  (10/31/2023)  Depression (PHQ2-9): Low Risk  (03/05/2024)  Financial Resource Strain: High Risk (10/31/2023)  Physical Activity: Sufficiently Active (10/31/2023)  Social Connections: Socially Isolated (06/12/2024)  Stress: No Stress Concern Present (10/31/2023)  Tobacco Use: Low Risk  (06/13/2024)  Health Literacy: Adequate Health Literacy (10/31/2023)   SDOH Interventions:     Readmission Risk Interventions     No data to display

## 2024-06-14 NOTE — Evaluation (Signed)
 Occupational Therapy Evaluation Patient Details Name: Michael Chang MRN: [id number] DOB: Nov 20, 1945 Today's Date: 06/14/2024   History of Present Illness   78 y.o. male admitted 06/11/24 after fall out of his recliner chair at home, sustaining a mildly displaced subcapital fracture of the  left femoral head and neck junction. CT of the neck, there was a soft tissue mass along the right side of oropharynx, concerning for oropharyngeal carcinoma. Pt underwent laryngoscopy, direct with biopsy 9/13. Pt s/p left bipolar hemiarthroplasty with posterior approach 9/13. PMHx: essential HTN, chronic diastolic dysfunction, hepatitis C, with a remote history of heroin use many years ago, CKD IIIb, and CAD.     Clinical Impressions Pt c/o L hip pain. Pt lives alone, PLOF mod I. Pt currently requires significant assistance for LB dressing/bathing due to L hip pain, mod A for STS, able to ambulate short distances using RW with CGA. Pt mod A to assist in/out of bed. Pt would benefit from continued acute OT to maximize functional independence with ADLs and mobility, recommending postacute rehab <3hrs/day.      If plan is discharge home, recommend the following:   A lot of help with walking and/or transfers;A little help with bathing/dressing/bathroom;Assistance with cooking/housework;Assist for transportation;Help with stairs or ramp for entrance     Functional Status Assessment   Patient has had a recent decline in their functional status and demonstrates the ability to make significant improvements in function in a reasonable and predictable amount of time.     Equipment Recommendations   Other (comment) (defer)     Recommendations for Other Services         Precautions/Restrictions   Precautions Precautions: Fall Recall of Precautions/Restrictions: Intact Precaution/Restrictions Comments: No posterior hip precautions Restrictions Weight Bearing Restrictions Per Provider Order:  Yes LLE Weight Bearing Per Provider Order: Weight bearing as tolerated     Mobility Bed Mobility Overal bed mobility: Needs Assistance Bed Mobility: Supine to Sit, Sit to Supine     Supine to sit: Mod assist Sit to supine: Mod assist   General bed mobility comments: mod A in/out of bed    Transfers Overall transfer level: Needs assistance Equipment used: Rolling walker (2 wheels) Transfers: Sit to/from Stand, Bed to chair/wheelchair/BSC Sit to Stand: Mod assist     Step pivot transfers: Contact guard assist     General transfer comment: mod A to power STS, CGA for taking steps short distances      Balance Overall balance assessment: Needs assistance Sitting-balance support: No upper extremity supported, Feet supported Sitting balance-Leahy Scale: Fair     Standing balance support: Bilateral upper extremity supported, During functional activity Standing balance-Leahy Scale: Poor Standing balance comment: reliant on RW for support                           ADL either performed or assessed with clinical judgement   ADL Overall ADL's : Needs assistance/impaired         Upper Body Bathing: Set up;Sitting   Lower Body Bathing: Maximal assistance;Sit to/from stand   Upper Body Dressing : Minimal assistance   Lower Body Dressing: Maximal assistance;Sit to/from stand   Toilet Transfer: Moderate assistance   Toileting- Clothing Manipulation and Hygiene: Moderate assistance       Functional mobility during ADLs: Contact guard assist General ADL Comments: Pt mod A for STS, once up CGA for ambulation short distances with RW. Pt max A for LB ADLs due to pain  in L hip     Vision Ability to See in Adequate Light: 0 Adequate Patient Visual Report: No change from baseline       Perception         Praxis         Pertinent Vitals/Pain Pain Assessment Pain Assessment: 0-10 Pain Score: 7  Pain Location: L hip Pain Descriptors / Indicators:  Operative site guarding, Grimacing, Discomfort, Aching     Extremity/Trunk Assessment Upper Extremity Assessment Upper Extremity Assessment: Overall WFL for tasks assessed           Communication Communication Communication: Impaired Factors Affecting Communication: Hearing impaired   Cognition Arousal: Alert Behavior During Therapy: WFL for tasks assessed/performed Cognition: No apparent impairments                               Following commands: Intact       Cueing  General Comments   Cueing Techniques: Verbal cues      Exercises     Shoulder Instructions      Home Living Family/patient expects to be discharged to:: Private residence Living Arrangements: Alone Available Help at Discharge: Family;Available PRN/intermittently Type of Home: Apartment Home Access: Level entry     Home Layout: One level     Bathroom Shower/Tub: Chief Strategy Officer: Standard     Home Equipment: Grab bars - toilet;Grab bars - tub/shower;Hand held shower head;Cane - quad;Rolling Walker (2 wheels);Shower seat   Additional Comments: ILF      Prior Functioning/Environment Prior Level of Function : Independent/Modified Independent             Mobility Comments: Ambulates using SPC. 1 fall leading to current admission. ADLs Comments: Indep with ADLs/IADLs. Pt doesn't drive, relies on sister for transportation.    OT Problem List: Decreased strength;Decreased range of motion;Decreased activity tolerance;Impaired balance (sitting and/or standing);Pain   OT Treatment/Interventions: Self-care/ADL training;Therapeutic exercise;Energy conservation;DME and/or AE instruction;Therapeutic activities;Patient/family education;Balance training      OT Goals(Current goals can be found in the care plan section)   Acute Rehab OT Goals Patient Stated Goal: to manage pain OT Goal Formulation: With patient Time For Goal Achievement: 06/28/24 Potential to  Achieve Goals: Good   OT Frequency:  Min 2X/week    Co-evaluation              AM-PAC OT 6 Clicks Daily Activity     Outcome Measure Help from another person eating meals?: None Help from another person taking care of personal grooming?: A Little Help from another person toileting, which includes using toliet, bedpan, or urinal?: A Lot Help from another person bathing (including washing, rinsing, drying)?: A Lot Help from another person to put on and taking off regular upper body clothing?: A Little Help from another person to put on and taking off regular lower body clothing?: A Lot 6 Click Score: 16   End of Session Equipment Utilized During Treatment: Gait belt;Rolling walker (2 wheels) Nurse Communication: Mobility status  Activity Tolerance: Patient tolerated treatment well Patient left: in bed;with call bell/phone within reach  OT Visit Diagnosis: Unsteadiness on feet (R26.81);Other abnormalities of gait and mobility (R26.89);Muscle weakness (generalized) (M62.81);Pain Pain - Right/Left: Left Pain - part of body: Hip                Time: 8479-8448 OT Time Calculation (min): 31 min Charges:  OT General Charges $OT Visit: 1 Visit OT Evaluation $  OT Eval Moderate Complexity: 1 Mod OT Treatments $Self Care/Home Management : 8-22 mins  [place], OTR/L   [name] 06/14/2024, 3:59 PM

## 2024-06-14 NOTE — NC FL2 (Signed)
 [place]  MEDICAID FL2 LEVEL OF CARE FORM     IDENTIFICATION  Patient Name: Michael Chang Birthdate: Sep 21, 1946 Sex: male Admission Date (Current Location): 06/11/2024  [facility] and [place] Number:  Producer, television/film/video and Address:  [facility] [place], [address] [place], [place] [address]      Provider Number: [id number]  Attending Physician Name and Address:  Odell Celinda Balo, MD  Relative Name and Phone Number:       Current Level of Care: Hospital Recommended Level of Care: Skilled Nursing Facility Prior Approval Number:    Date Approved/Denied:   PASRR Number: [id number]  Discharge Plan: SNF    Current Diagnoses: Patient Active Problem List   Diagnosis Date Noted   Mass of oropharynx 06/12/2024   Closed left hip fracture (HCC) 06/11/2024   Hyperkalemia 06/11/2024   AKI (acute kidney injury) (HCC) 06/11/2024   Thrombocytopenia (HCC) 06/11/2024   Oropharyngeal mass 06/11/2024   Cerumen impaction 03/05/2024   Elevated PSA 02/28/2023   Preventative health care 02/10/2021   Retinal holes, right 03/19/2020   Secondary corneal edema, right 03/15/2020   Aphakia of eye, right 03/15/2020   Normocytic anemia 03/02/2020   Proteinuria 03/02/2020   Iron deficiency anemia    Weakness    Cardiomyopathy, ischemic 02/14/2020   Skin ulcer of right foot (HCC) 01/07/2020   CAD (coronary artery disease) 01/07/2020   History of non-ST elevation myocardial infarction (NSTEMI) 01/05/2020   Congestive heart failure (HCC) 01/05/2020   PAD (peripheral artery disease) (HCC) 01/05/2020   Chronic ulcer of right lower extremity with fat layer exposed (HCC) 01/05/2020   Chronic kidney disease, stage IIIb (moderate) (HCC) 01/05/2020   Essential hypertension 01/05/2020   Elevated TSH 01/05/2020    Orientation RESPIRATION BLADDER Height & Weight     Self, Time, Situation, Place  Normal Continent Weight: 143 lb 4.8 oz (65 kg) Height:  6' 0.99 (185.4 cm)   BEHAVIORAL SYMPTOMS/MOOD NEUROLOGICAL BOWEL NUTRITION STATUS      Continent Diet (please see discharge summary)  AMBULATORY STATUS COMMUNICATION OF NEEDS Skin   Extensive Assist Verbally Surgical wounds (closed incision Left Hip)                       Personal Care Assistance Level of Assistance  Bathing, Feeding, Dressing Bathing Assistance: Maximum assistance Feeding assistance: Independent Dressing Assistance: Maximum assistance     Functional Limitations Info  Sight, Hearing, Speech Sight Info: Impaired (glasses) Hearing Info: Adequate Speech Info: Adequate    SPECIAL CARE FACTORS FREQUENCY  PT (By licensed PT), OT (By licensed OT)     PT Frequency: 5x per week OT Frequency: 5x per week            Contractures Contractures Info: Not present    Additional Factors Info  Code Status, Allergies Code Status Info: DNR- Limited Allergies Info: NKA           Current Medications (06/14/2024):  This is the current hospital active medication list Current Facility-Administered Medications  Medication Dose Route Frequency Provider Last Rate Last Admin   0.9 %  sodium chloride  infusion (Manually program via Guardrails IV Fluids)   Intravenous Once Gawne, Meghan M, PA-C   Held at 06/11/24 2059   0.9 %  sodium chloride  infusion (Manually program via Guardrails IV Fluids)   Intravenous Once Gawne, Meghan M, PA-C   Paused at 06/11/24 2059   acetaminophen  (TYLENOL ) tablet 325-650 mg  325-650 mg Oral Q6H PRN  Gawne, Meghan M, PA-C       alum & mag hydroxide-simeth (MAALOX/MYLANTA) 200-200-20 MG/5ML suspension 30 mL  30 mL Oral Q4H PRN Gawne, Meghan M, PA-C       amLODipine  (NORVASC ) tablet 5 mg  5 mg Oral Daily Gawne, Meghan M, PA-C   5 mg at 06/14/24 0920   bisacodyl  (DULCOLAX) suppository 10 mg  10 mg Rectal Daily PRN Gawne, Meghan M, PA-C       docusate sodium  (COLACE) capsule 100 mg  100 mg Oral BID Gawne, Meghan M, PA-C   100 mg at 06/14/24 0920   enoxaparin  (LOVENOX )  injection 30 mg  30 mg Subcutaneous Q24H Gawne, Meghan M, PA-C   30 mg at 06/14/24 0920   HYDROcodone -acetaminophen  (NORCO) 7.5-325 MG per tablet 1-2 tablet  1-2 tablet Oral Q4H PRN Gawne, Meghan M, PA-C   1 tablet at 06/13/24 2116   HYDROcodone -acetaminophen  (NORCO/VICODIN) 5-325 MG per tablet 1-2 tablet  1-2 tablet Oral Q4H PRN Gawne, Meghan M, PA-C       menthol  (CEPACOL) lozenge 3 mg  1 lozenge Oral PRN Gawne, Meghan M, PA-C       Or   phenol (CHLORASEPTIC) mouth spray 1 spray  1 spray Mouth/Throat PRN Gawne, Meghan M, PA-C       methocarbamol  (ROBAXIN ) tablet 500 mg  500 mg Oral Q6H PRN Gawne, Meghan M, PA-C       Or   methocarbamol  (ROBAXIN ) injection 500 mg  500 mg Intravenous Q6H PRN Gawne, Meghan M, PA-C       metoCLOPramide  (REGLAN ) tablet 5-10 mg  5-10 mg Oral Q8H PRN Gawne, Meghan M, PA-C       Or   metoCLOPramide  (REGLAN ) injection 5-10 mg  5-10 mg Intravenous Q8H PRN Gawne, Meghan M, PA-C       morphine  (PF) 2 MG/ML injection 0.5-1 mg  0.5-1 mg Intravenous Q2H PRN Gawne, Meghan M, PA-C       ondansetron  (ZOFRAN ) tablet 4 mg  4 mg Oral Q6H PRN Gawne, Meghan M, PA-C       Or   ondansetron  (ZOFRAN ) injection 4 mg  4 mg Intravenous Q6H PRN Gawne, Meghan M, PA-C       polyethylene glycol (MIRALAX  / GLYCOLAX ) packet 17 g  17 g Oral Daily PRN Gawne, Meghan M, PA-C       polyethylene glycol (MIRALAX  / GLYCOLAX ) packet 17 g  17 g Oral BID Odell Celinda Balo, MD   17 g at 06/14/24 0920     Discharge Medications: Please see discharge summary for a list of discharge medications.  Relevant Imaging Results:  Relevant Lab Results:   Additional Information SSN [id number]  Montie LOISE Louder, LCSW

## 2024-06-15 ENCOUNTER — Inpatient Hospital Stay (HOSPITAL_COMMUNITY)

## 2024-06-15 DIAGNOSIS — D696 Thrombocytopenia, unspecified: Secondary | ICD-10-CM | POA: Diagnosis not present

## 2024-06-15 DIAGNOSIS — R55 Syncope and collapse: Secondary | ICD-10-CM | POA: Diagnosis not present

## 2024-06-15 DIAGNOSIS — S72002A Fracture of unspecified part of neck of left femur, initial encounter for closed fracture: Secondary | ICD-10-CM | POA: Diagnosis not present

## 2024-06-15 DIAGNOSIS — K1379 Other lesions of oral mucosa: Secondary | ICD-10-CM | POA: Diagnosis not present

## 2024-06-15 LAB — BASIC METABOLIC PANEL WITH GFR
Anion gap: 14 (ref 5–15)
BUN: 46 mg/dL — ABNORMAL HIGH (ref 8–23)
CO2: 24 mmol/L (ref 22–32)
Calcium: 8.2 mg/dL — ABNORMAL LOW (ref 8.9–10.3)
Chloride: 101 mmol/L (ref 98–111)
Creatinine, Ser: 2.23 mg/dL — ABNORMAL HIGH (ref 0.61–1.24)
GFR, Estimated: 29 mL/min — ABNORMAL LOW (ref >60)
Glucose, Bld: 86 mg/dL (ref 70–99)
Potassium: 3.5 mmol/L (ref 3.5–5.1)
Sodium: 139 mmol/L (ref 135–145)

## 2024-06-15 LAB — CBC
HCT: 30.6 % — ABNORMAL LOW (ref 39.0–52.0)
Hemoglobin: 9.8 g/dL — ABNORMAL LOW (ref 13.0–17.0)
MCH: 28.2 pg (ref 26.0–34.0)
MCHC: 32 g/dL (ref 30.0–36.0)
MCV: 88.2 fL (ref 80.0–100.0)
Platelets: 264 K/uL (ref 150–400)
RBC: 3.47 MIL/uL — ABNORMAL LOW (ref 4.22–5.81)
RDW: 13.9 % (ref 11.5–15.5)
WBC: 9.1 K/uL (ref 4.0–10.5)
nRBC: 0 % (ref 0.0–0.2)

## 2024-06-15 MED ORDER — HYDROCODONE-ACETAMINOPHEN 5-325 MG PO TABS: 1.0000 | ORAL_TABLET | Freq: Four times a day (QID) | ORAL | 0 refills | Status: AC | PRN

## 2024-06-15 MED ORDER — ASPIRIN 81 MG PO TBEC
81.0000 mg | DELAYED_RELEASE_TABLET | Freq: Every day | ORAL | Status: DC
  Administered 2024-06-15 – 2024-06-16 (×2): 81 mg via ORAL
  Filled 2024-06-15 (×2): qty 1

## 2024-06-15 MED ORDER — SODIUM CHLORIDE 0.9 % IV SOLN: INTRAVENOUS | Status: AC

## 2024-06-15 MED ORDER — CARVEDILOL 12.5 MG PO TABS
12.5000 mg | ORAL_TABLET | Freq: Every day | ORAL | Status: DC
  Administered 2024-06-15 – 2024-06-16 (×2): 12.5 mg via ORAL
  Filled 2024-06-15 (×2): qty 1

## 2024-06-15 MED ORDER — CARVEDILOL 25 MG PO TABS
25.0000 mg | ORAL_TABLET | Freq: Every day | ORAL | Status: DC
  Administered 2024-06-15: 25 mg via ORAL
  Filled 2024-06-15: qty 1

## 2024-06-15 MED ORDER — LOSARTAN POTASSIUM 50 MG PO TABS: 25.0000 mg | ORAL_TABLET | Freq: Every day | ORAL | Status: DC

## 2024-06-15 MED ORDER — ASPIRIN 81 MG PO TBEC: 81.0000 mg | DELAYED_RELEASE_TABLET | Freq: Two times a day (BID) | ORAL | 0 refills | Status: AC

## 2024-06-15 MED ORDER — SODIUM CHLORIDE 0.9 % IV BOLUS
500.0000 mL | Freq: Once | INTRAVENOUS | Status: AC
  Administered 2024-06-15: 500 mL via INTRAVENOUS

## 2024-06-15 NOTE — TOC Progression Note (Signed)
 Transition of Care [facility]) - Progression Note    Patient Details  Name: Michael Chang MRN: [id number] Date of Birth: 10-16-1945  [facility]) CM/SW Contact  Bridget Cordella Simmonds, LCSW Phone Number: 06/15/2024, 1:30 PM  Clinical Narrative:  Bed offers provided to pt on medicare choice document.   1300: CSW spoke with pt about SNF choice. He would like input from his sister.  CSW spoke with sister [name], she requested medicare list be emailed to her and she will talk with pt.  Email sent     Expected Discharge Plan: Skilled Nursing Facility Barriers to Discharge: English as a second language teacher, Continued Medical Work up, SNF Pending bed offer               Expected Discharge Plan and Services In-house Referral: Clinical Social Work     Living arrangements for the past 2 months: Apartment                                       Social Drivers of Health (SDOH) Interventions SDOH Screenings   Food Insecurity: No Food Insecurity (06/12/2024)  Housing: Low Risk  (06/12/2024)  Transportation Needs: No Transportation Needs (06/12/2024)  Utilities: Not At Risk (06/12/2024)  Alcohol Screen: Low Risk  (10/31/2023)  Depression (PHQ2-9): Low Risk  (03/05/2024)  Financial Resource Strain: High Risk (10/31/2023)  Physical Activity: Sufficiently Active (10/31/2023)  Social Connections: Socially Isolated (06/12/2024)  Stress: No Stress Concern Present (10/31/2023)  Tobacco Use: Low Risk  (06/13/2024)  Health Literacy: Adequate Health Literacy (10/31/2023)    Readmission Risk Interventions     No data to display

## 2024-06-15 NOTE — Progress Notes (Signed)
 Subjective: Patient reports pain as mild to moderate. Currently very sleepy. Tolerating soft diet.  Urinating.   No CP, SOB.  Has mobilized OOB with PT/OT some but his sister is worried he is not wanting to move enough.   Objective:   VITALS:   Vitals:   06/14/24 1954 06/15/24 0408 06/15/24 0745 06/15/24 1505  BP: 131/74 (!) 148/94 (!) 158/98 117/74  Pulse: 81 91 93   Resp:   16   Temp: (!) 97.5 F (36.4 C) 98.4 F (36.9 C) 97.8 F (36.6 C) 99.4 F (37.4 C)  TempSrc: Oral Oral  Oral  SpO2: 98% 94% 99% 94%  Weight:      Height:          Latest Ref Rng & Units 06/15/2024    4:28 AM 06/14/2024    7:39 AM 06/13/2024    3:50 AM  CBC  WBC 4.0 - 10.5 K/uL 9.1  11.5  11.5   Hemoglobin 13.0 - 17.0 g/dL 9.8  8.7  8.5   Hematocrit 39.0 - 52.0 % 30.6  27.5  26.7   Platelets 150 - 400 K/uL 264  240  271       Latest Ref Rng & Units 06/15/2024    4:28 AM 06/14/2024    7:39 AM 06/13/2024   12:54 PM  BMP  Glucose 70 - 99 mg/dL 86  891  885   BUN 8 - 23 mg/dL 46  55  47   Creatinine 0.61 - 1.24 mg/dL 7.76  7.26  7.86   Sodium 135 - 145 mmol/L 139  137  140   Potassium 3.5 - 5.1 mmol/L 3.5  3.7  4.0   Chloride 98 - 111 mmol/L 101  100  107   CO2 22 - 32 mmol/L 24  26  21    Calcium 8.9 - 10.3 mg/dL 8.2  7.9  7.9    Intake/Output      09/14 0701 09/15 0700 09/15 0701 09/16 0700   P.O. 600 240   Total Intake(mL/kg) 600 (9.2) 240 (3.7)   Urine (mL/kg/hr) 1280 (0.8) 200 (0.3)   Stool 0 0   Blood     Total Output 1280 200   Net -680 +40        Stool Occurrence 2 x 1 x      Physical Exam: General: NAD.  Sitting up in bed, sleeping, occasionally wakes up for a moment but words mostly mumbled Resp: No increased wob Cardio: regular rate and rhythm ABD soft Neurologically intact MSK Neurovascularly intact Sensation intact distally Intact pulses distally Dorsiflexion/Plantar flexion intact Incision: dressing C/D/I   Assessment: 2 Days Post-Op  S/P Procedure(s)  (LRB): HEMIARTHROPLASTY (BIPOLAR) HIP, POSTERIOR APPROACH FOR FRACTURE (Left) LARYNGOSCOPY, DIRECT (N/A) by Dr. Evalene BIRCH. Beverley on 06/13/24  Principal Problem:   Closed left hip fracture (HCC) Active Problems:   Chronic kidney disease, stage IIIb (moderate) (HCC)   Essential hypertension   Normocytic anemia   Hyperkalemia   AKI (acute kidney injury) (HCC)   Thrombocytopenia (HCC)   Oropharyngeal mass   Mass of oropharynx   Plan:  Up with therapy Incentive Spirometry Elevate and Apply ice  Weightbearing: WBAT LLE Insicional and dressing care: Dressings left intact until follow-up and Reinforce dressings as needed Orthopedic device(s): None Showering: Keep dressing dry VTE prophylaxis: Lovenox  30mg  daily, SCDs, ambulation Pain control: PRN Follow - up plan: 2 weeks postop Contact information:  Evalene Beverley MD, Gerard Large PA-C  Dispo: PT/OT recommending  SNF and patient agreeable. Waiting for bed placement. Ok to d/c from ortho standpoint when medically ready. Will print and sign script for d/c.     Gerard CHRISTELLA Large, PA-C Office [phone] 06/15/2024, 4:55 PM

## 2024-06-15 NOTE — Evaluation (Signed)
 Modified Barium Swallow Study  Patient Details  Name: Michael Chang MRN: [id number] Date of Birth: 10-18-1945  Today's Date: 06/15/2024  Modified Barium Swallow completed.  Full report located under Chart Review in the Imaging Section.  History of Present Illness 78 y.o. male admitted 06/11/24 after fall out of his recliner chair at home, sustaining a mildly displaced subcapital fracture of the  left femoral head and neck junction. CT of the neck, there was a soft tissue mass along the right side of oropharynx, concerning for oropharyngeal carcinoma. Pt underwent laryngoscopy, direct with biopsy 9/13. Pt s/p left bipolar hemiarthroplasty with posterior approach 9/13. PMHx: essential HTN, chronic diastolic dysfunction, hepatitis C, with a remote history of heroin use many years ago, CKD IIIb, and CAD.   Clinical Impression Pt has an oropharyngeal dysphagia with DIGEST score of 3. He has more mild oral symptoms that include weak lingual propulsion and a small collection of oral residue, which he mostly clears spontaneously. Pharyngeally he has a more severe dysphagia, with incomplete velopharyngeal closure, base of tongue retraction, pharyngeal squeeze, and anterior hyoid excursion. This contributes to reduced UES opening and minimal epiglottic deflection, resulting in significant pharyngeal residue. This is diffuse but primarily in the valleculae, and pt does not have awareness of its presence. Multiple swallows help to reduce some of the residue, but it never fully clears. A nectar thick liquids wash also helps to clear some of the residue from purees. Postural strategies, including a chin tuck or head turns, do not impact function. Pt also has reduced laryngeal elevation and laryngeal vestibule closure, and as he is trying to swallow or clear his residue, intermittent aspiration occurs with thin liquids (PAS 7). Penetration is transient with other consistencies (PAS 2), but becomes more frank when  trying to use a nectar thick liquid wash after more solid boluses (PAS 3). Recommend that he start with Dys 1 (puree) diet and nectar thick liquids with strategies including alternating bites/sips and using multiple, effortful swallows. Pt is likely to need reinforcement of strategies before considering advancement, although note that if he is to begin tx for his mass, this may further exacerbate the dysphagia that is already present. SLP f/u is indicated acutely and at next level of care.  Factors that may increase risk of adverse event in presence of aspiration Noe & Lianne 2021): Poor general health and/or compromised immunity;Frail or deconditioned;Limited mobility;Inadequate oral hygiene;Weak cough  Swallow Evaluation Recommendations Recommendations: PO diet PO Diet Recommendation: Dysphagia 1 (Pureed);Mildly thick liquids (Level 2, nectar thick) Liquid Administration via: Cup;No straw Medication Administration: Crushed with puree Supervision: Staff to assist with self-feeding;Full supervision/cueing for swallowing strategies Swallowing strategies  : Slow rate;Small bites/sips;effortful swallow;Multiple dry swallows after each bite/sip;Follow solids with liquids Postural changes: Position pt fully upright for meals;Stay upright 30-60 min after meals Oral care recommendations: Oral care BID (2x/day) Caregiver Recommendations: Avoid jello, ice cream, thin soups, popsicles;Remove water pitcher;Have oral suction available      Leita SAILOR., M.A. CCC-SLP Acute Rehabilitation Services Office: [phone]  Secure chat preferred  06/15/2024,11:24 AM

## 2024-06-15 NOTE — Plan of Care (Signed)
?  Problem: Education: ?Goal: Knowledge of General Education information will improve ?Description: Including pain rating scale, medication(s)/side effects and non-pharmacologic comfort measures ?Outcome: Progressing ?  ?Problem: Health Behavior/Discharge Planning: ?Goal: Ability to manage health-related needs will improve ?Outcome: Progressing ?  ?Problem: Clinical Measurements: ?Goal: Will remain free from infection ?Outcome: Progressing ?Goal: Cardiovascular complication will be avoided ?Outcome: Progressing ?  ?Problem: Activity: ?Goal: Risk for activity intolerance will decrease ?Outcome: Progressing ?  ?

## 2024-06-15 NOTE — Progress Notes (Signed)
 Physical Therapy Treatment Patient Details Name: Michael Chang MRN: [id number] DOB: 1945/12/19 Today's Date: 06/15/2024   History of Present Illness 78 y.o. male admitted 06/11/24 after fall out of his recliner chair at home, sustaining a mildly displaced subcapital fracture of the  left femoral head and neck junction. CT of the neck, there was a soft tissue mass along the right side of oropharynx, concerning for oropharyngeal carcinoma. Pt underwent laryngoscopy, direct with biopsy 9/13. Pt s/p left bipolar hemiarthroplasty with posterior approach 9/13. PMHx: essential HTN, chronic diastolic dysfunction, hepatitis C, with a remote history of heroin use many years ago, CKD IIIb, and CAD.    PT Comments  Pt with much improved pain control; however, overall session limited due to toileting needs then syncopal episode.  Pt transferring to [facility] with mod A bed mobility and min A step pivot.  Increased time sitting on toilet and then standing for ADLs.  Pt developed syncopal symptoms after return to sitting - suspect orthostatic hypotension but by the time pt transferred back to bed and dynamap retrieved BP up to 117/74 and pt alert.  RN present, MD notified.    If plan is discharge home, recommend the following: A lot of help with bathing/dressing/bathroom;Assistance with cooking/housework;Assist for transportation;Help with stairs or ramp for entrance;A lot of help with walking and/or transfers   Can travel by private vehicle     No  Equipment Recommendations  Wheelchair (measurements PT);Wheelchair cushion (measurements PT);Rolling walker (2 wheels);BSC/3in1    Recommendations for Other Services       Precautions / Restrictions Precautions Precautions: Fall Precaution/Restrictions Comments: No posterior hip precautions Restrictions LLE Weight Bearing Per Provider Order: Weight bearing as tolerated     Mobility  Bed Mobility Overal bed mobility: Needs Assistance Bed Mobility: Supine to Sit,  Sit to Supine     Supine to sit: Mod assist Sit to supine: Total assist   General bed mobility comments: Mod A and increased cues for transition to EOB.  Total back to bed due to syncope.    Transfers Overall transfer level: Needs assistance Equipment used: Rolling walker (2 wheels) Transfers: Sit to/from Stand Sit to Stand: Mod assist, From elevated surface   Step pivot transfers: Min assist, From elevated surface Squat pivot transfers: Total assist, +2 physical assistance     General transfer comment: Pt stood from bed and BSC with light mod A and increased time.  Initial pivot to [facility] with min A and cues for RW.  Sat BSC for at least 10 mins.  Pt stood for ADLs for 1-2 mins.  Reports lightheaded when returned for sitting.  Called for assist.  Pt beginning to slur speech and less responsive.  Called for emergent assist.  Totat x 2 pivot back to bed, pt syncopal.  In supine, HR 80's, nursing went to get dynamap.  Took increased time to read and was up to 117/74 in supine and pt alert and oriented.    Ambulation/Gait                   Stairs             Wheelchair Mobility     Tilt Bed    Modified Rankin (Stroke Patients Only)       Balance Overall balance assessment: Needs assistance Sitting-balance support: No upper extremity supported, Feet supported Sitting balance-Leahy Scale: Fair     Standing balance support: Bilateral upper extremity supported, During functional activity, Single extremity supported Standing balance-Leahy Scale: Poor  Standing balance comment: Needs at least single UE support on RW during ADLs.  Stood for 1-2 mins with ADLs.                            Communication Communication Factors Affecting Communication: Hearing impaired  Cognition Arousal: Alert Behavior During Therapy: WFL for tasks assessed/performed   PT - Cognitive impairments: No family/caregiver present to determine baseline, Sequencing, Problem  solving                       PT - Cognition Comments: Pt had BM in bed, aware but not calling nursing.  Nursing reports did this earlier today also. Also needing frequent cues for sequencing and problem solving Following commands: Impaired Following commands impaired: Follows one step commands with increased time    Cueing    Exercises      General Comments        Pertinent Vitals/Pain Pain Assessment Pain Assessment: Faces Faces Pain Scale: Hurts a little bit Pain Location: L hip Pain Descriptors / Indicators: Operative site guarding, Grimacing, Discomfort, Aching Pain Intervention(s): Limited activity within patient's tolerance, Monitored during session, Premedicated before session, Repositioned, Ice applied    Home Living                          Prior Function            PT Goals (current goals can now be found in the care plan section) Progress towards PT goals: Progressing toward goals    Frequency    Min 2X/week      PT Plan      Co-evaluation              AM-PAC PT 6 Clicks Mobility   Outcome Measure  Help needed turning from your back to your side while in a flat bed without using bedrails?: A Lot Help needed moving from lying on your back to sitting on the side of a flat bed without using bedrails?: A Lot Help needed moving to and from a bed to a chair (including a wheelchair)?: A Little Help needed standing up from a chair using your arms (e.g., wheelchair or bedside chair)?: A Little Help needed to walk in hospital room?: Total Help needed climbing 3-5 steps with a railing? : Total 6 Click Score: 12    End of Session Equipment Utilized During Treatment: Gait belt Activity Tolerance: Treatment limited secondary to medical complications (Comment);Other (comment) (Limited due to toileting needs and orthostatic) Patient left: in bed;with call bell/phone within reach;with bed alarm set;with nursing/sitter in room Nurse  Communication: Mobility status;Other (comment) (nursing present after syncopal episode; notified MD of episode) PT Visit Diagnosis: Pain;Muscle weakness (generalized) (M62.81);Other abnormalities of gait and mobility (R26.89);Difficulty in walking, not elsewhere classified (R26.2) Pain - Right/Left: Left     Time: 8575-8493 PT Time Calculation (min) (ACUTE ONLY): 42 min  Charges:    $Therapeutic Activity: 38-52 mins PT General Charges $$ ACUTE PT VISIT: 1 Visit                     Benjiman, PT [facility] [phone]    Benjiman VEAR Mulberry 06/15/2024, 3:21 PM

## 2024-06-15 NOTE — Care Management Important Message (Signed)
 Important Message  Patient Details  Name: Michael Chang MRN: [id number] Date of Birth: [birth year]   Important Message Given:  Yes - Medicare IM     Jon Cruel 06/15/2024, 11:52 AM

## 2024-06-15 NOTE — Progress Notes (Signed)
 [facility] PROGRESS NOTE    Progress Note  Michael Chang  FMW:[id number] DOB: [birth year] DOA: 06/11/2024 PCP: Gretta Comer POUR, NP     Brief Narrative:   Michael Chang is an 78 y.o. male  past medical history of essential hypertension, chronic diastolic dysfunction, history of hepatitis C, with a remote history of heroin use many years ago, chronic kidney disease stage IIIb, coronary artery disease comes into the hospital after fall that happened 2 days prior to admission.  was sitting down he denies any loss of consciousness but he started feeling dizzy and felt from the sitting position to the left side after he fell he could not get up and spent 2 days crawling around the floor in his house, and due to the pain he called EMS was he was able to get to the phone.  He denies any loss of consciousness no change in vision and only dizziness right before the fall. He relates he is also been getting short of breath for the last several months and he has had some dysp of his voice.  And some difficulty swallowing.  Assessment/Plan:   Closed left hip fracture [facility]) Orthopedic surgery was consulted, status post hemiarthroplasty. Out of bed to chair, PT OT to, weightbearing as tolerated. Has not required narcotics Started on a bowel regimen. Will need skilled nursing facility  Severe thrombocytopenia: He status post 2 units of platelet counts.now resolved. Plt's 271. Question if initial lab was erroneous.  Preop evaluation: Patient is at risk of complications due to his elevated potassium and new acute kidney injury.   PLT Count of 271k PT/INR 17/1.4.  Acute kidney injury on chronic kidney disease stage IIIb: With a baseline creatinine of 1.7-1.2 on admission 3.8. He was started on IV fluids, likely hemodynamically mediated in the setting of antihypertensive medication and decreased oral intake. Creatinine has returned to baseline.  Hyperkalemia: With a baseline creatinine  1.7-2.2, on admission his potassium was 5.5. Resolved with IV fluid and Lokelma .  Mild traumatic rhabdomyolysis: Resolved with IV fluids.  Essential hypertension Resume Coreg  blood pressures rising.  Normocytic anemia: normally he runs around 12 on admission 8. Question chronic blood loss from oropharyngeal mass versus infiltrative disease. CBC shows a hemoglobin stable around 8.5.  Oropharyngeal mass: CTA of the head and neck showed a right sided oropharyngeal mass. ENT was consulted who performed a transnasal fiberoptic laryngoscopy and showed retropalatal space mass with restricted space with mass effect.  Status post laryngoscopy with biopsy of the mass on 06/14/2024.  History of hepatitis C: Noted.  Goals of care: Patient is a DNR/DNI. Palliative Care has been consulted.   DVT prophylaxis: SCD Family Communication:sister Status is: Inpatient Remains inpatient appropriate because: hip fracture    Code Status:     Code Status Orders  (From admission, onward)           Start     Ordered   06/11/24 1705  Do not attempt resuscitation (DNR)- Limited -Do Not Intubate (DNI)  Continuous       Question Answer Comment  If pulseless and not breathing No CPR or chest compressions.   In Pre-Arrest Conditions (Patient Is Breathing and Has A Pulse) Do not intubate. Provide all appropriate non-invasive medical interventions. Avoid ICU transfer unless indicated or required.   Consent: Discussion documented in EHR or advanced directives reviewed      06/11/24 1709           Code Status History     Date  Active Date Inactive Code Status Order ID Comments User Context   06/11/2024 1645 06/11/2024 1709 Limited: Do not attempt resuscitation (DNR) -DNR-LIMITED -Do Not Intubate/DNI  [id number]  Odell Celinda Balo, MD ED   02/14/2020 0740 02/15/2020 1735 Full Code [id number]  Lanetta Lingo, MD ED         IV Access:   Peripheral IV   Procedures and diagnostic studies:    No results found.    Medical Consultants:   None.   Subjective:    Yeriel Mineo no pain this morning tolerating his diet had a bowel movement.  Objective:    Vitals:   06/14/24 1738 06/14/24 1954 06/15/24 0408 06/15/24 0745  BP: (!) 151/81 131/74 (!) 148/94 (!) 158/98  Pulse: 93 81 91 93  Resp: 15   16  Temp: 98 F (36.7 C) (!) 97.5 F (36.4 C) 98.4 F (36.9 C) 97.8 F (36.6 C)  TempSrc: Oral Oral Oral   SpO2: 99% 98% 94% 99%  Weight:      Height:       SpO2: 99 % O2 Flow Rate (L/min): 2 L/min   Intake/Output Summary (Last 24 hours) at 06/15/2024 0838 Last data filed at 06/15/2024 0300 Gross per 24 hour  Intake 600 ml  Output 1280 ml  Net -680 ml   Filed Weights   06/11/24 1306 06/13/24 0800  Weight: 65 kg 65 kg    Exam: General exam: In no acute distress. Respiratory system: Good air movement and clear to auscultation. Cardiovascular system: S1 & S2 heard, RRR. No JVD. Gastrointestinal system: Abdomen is nondistended, soft and nontender.  Extremities: No pedal edema. Skin: No rashes, lesions or ulcers Psychiatry: Judgement and insight appear normal. Mood & affect appropriate. Data Reviewed:    Labs: Basic Metabolic Panel: Recent Labs  Lab 06/11/24 2301 06/12/24 0453 06/13/24 0350 06/13/24 1254 06/14/24 0739 06/15/24 0428  NA  --  146* 141 140 137 139  K  --  4.1 3.9 4.0 3.7 3.5  CL  --  107 107 107 100 101  CO2  --  23 23 21* 26 24  GLUCOSE  --  97 106* 114* 108* 86  BUN  --  45* 53* 47* 55* 46*  CREATININE  --  2.53* 2.29* 2.13* 2.73* 2.23*  CALCIUM  --  8.4* 7.8* 7.9* 7.9* 8.2*  MG 2.2  --   --   --   --   --    GFR Estimated Creatinine Clearance: 25.1 mL/min (A) (by C-G formula based on SCr of 2.23 mg/dL (H)). Liver Function Tests: Recent Labs  Lab 06/11/24 1413 06/13/24 1254  AST 70* 42*  ALT 27 25  ALKPHOS 47 48  BILITOT 1.6* 0.7  PROT 7.7 6.5  ALBUMIN 2.9* 2.5*   Recent Labs  Lab 06/11/24 1413  LIPASE 37   No  results for input(s): AMMONIA in the last 168 hours. Coagulation profile Recent Labs  Lab 06/11/24 2301 06/12/24 0453  INR 1.4* 1.2   COVID-19 Labs  No results for input(s): DDIMER, FERRITIN, LDH, CRP in the last 72 hours.  Lab Results  Component Value Date   SARSCOV2NAA NEGATIVE 02/14/2020    CBC: Recent Labs  Lab 06/11/24 1413 06/11/24 1420 06/12/24 0801 06/12/24 1224 06/13/24 0350 06/14/24 0739 06/15/24 0428  WBC 7.5   < > 9.2 9.1 11.5* 11.5* 9.1  NEUTROABS 6.0  --   --  8.2*  --   --   --   HGB 8.9*   < >  10.7* 10.1* 8.5* 8.7* 9.8*  HCT 27.6*   < > 33.7* 31.3* 26.7* 27.5* 30.6*  MCV 92.9   < > 91.6 89.9 91.4 90.5 88.2  PLT 5*   < > 367 353 271 240 264   < > = values in this interval not displayed.   Cardiac Enzymes: Recent Labs  Lab 06/11/24 1413 06/12/24 0801  CKTOTAL 1,667* 784*   BNP (last 3 results) No results for input(s): PROBNP in the last 8760 hours. CBG: No results for input(s): GLUCAP in the last 168 hours. D-Dimer: No results for input(s): DDIMER in the last 72 hours. Hgb A1c: No results for input(s): HGBA1C in the last 72 hours. Lipid Profile: No results for input(s): CHOL, HDL, LDLCALC, TRIG, CHOLHDL, LDLDIRECT in the last 72 hours. Thyroid  function studies: No results for input(s): TSH, T4TOTAL, T3FREE, THYROIDAB in the last 72 hours.  Invalid input(s): FREET3 Anemia work up: No results for input(s): VITAMINB12, FOLATE, FERRITIN, TIBC, IRON, RETICCTPCT in the last 72 hours. Sepsis Labs: Recent Labs  Lab 06/12/24 1224 06/13/24 0350 06/14/24 0739 06/15/24 0428  WBC 9.1 11.5* 11.5* 9.1   Microbiology Recent Results (from the past 240 hours)  Surgical pcr screen     Status: Abnormal   Collection Time: 06/11/24  6:14 PM   Specimen: Nasal Mucosa; Nasal Swab  Result Value Ref Range Status   MRSA, PCR NEGATIVE NEGATIVE Final   Staphylococcus aureus POSITIVE (A) NEGATIVE Final     Comment: (NOTE) The Xpert SA Assay (FDA approved for NASAL specimens in patients 7 years of age and older), is one component of a comprehensive surveillance program. It is not intended to diagnose infection nor to guide or monitor treatment. Performed at [facility], [address] [place], [place] [address]      Medications:    sodium chloride    Intravenous Once   sodium chloride    Intravenous Once   amLODipine   5 mg Oral Daily   docusate sodium   100 mg Oral BID   enoxaparin  (LOVENOX ) injection  30 mg Subcutaneous Q24H   polyethylene glycol  17 g Oral BID   Continuous Infusions:      LOS: 4 days   Erle Odell Castor  [facility]  06/15/2024, 8:38 AM

## 2024-06-16 ENCOUNTER — Encounter (HOSPITAL_COMMUNITY): Payer: Self-pay | Admitting: Orthopedic Surgery

## 2024-06-16 DIAGNOSIS — I10 Essential (primary) hypertension: Secondary | ICD-10-CM | POA: Diagnosis present

## 2024-06-16 DIAGNOSIS — I5032 Chronic diastolic (congestive) heart failure: Secondary | ICD-10-CM | POA: Diagnosis present

## 2024-06-16 DIAGNOSIS — E43 Unspecified severe protein-calorie malnutrition: Secondary | ICD-10-CM | POA: Diagnosis not present

## 2024-06-16 DIAGNOSIS — R531 Weakness: Secondary | ICD-10-CM | POA: Diagnosis not present

## 2024-06-16 DIAGNOSIS — N179 Acute kidney failure, unspecified: Secondary | ICD-10-CM | POA: Diagnosis not present

## 2024-06-16 DIAGNOSIS — Z743 Need for continuous supervision: Secondary | ICD-10-CM | POA: Diagnosis not present

## 2024-06-16 DIAGNOSIS — J392 Other diseases of pharynx: Secondary | ICD-10-CM | POA: Diagnosis not present

## 2024-06-16 DIAGNOSIS — I2510 Atherosclerotic heart disease of native coronary artery without angina pectoris: Secondary | ICD-10-CM | POA: Diagnosis present

## 2024-06-16 DIAGNOSIS — R1312 Dysphagia, oropharyngeal phase: Secondary | ICD-10-CM | POA: Diagnosis not present

## 2024-06-16 DIAGNOSIS — I130 Hypertensive heart and chronic kidney disease with heart failure and stage 1 through stage 4 chronic kidney disease, or unspecified chronic kidney disease: Secondary | ICD-10-CM | POA: Diagnosis not present

## 2024-06-16 DIAGNOSIS — S72002A Fracture of unspecified part of neck of left femur, initial encounter for closed fracture: Secondary | ICD-10-CM | POA: Diagnosis not present

## 2024-06-16 DIAGNOSIS — C099 Malignant neoplasm of tonsil, unspecified: Secondary | ICD-10-CM | POA: Diagnosis not present

## 2024-06-16 DIAGNOSIS — C760 Malignant neoplasm of head, face and neck: Secondary | ICD-10-CM | POA: Diagnosis not present

## 2024-06-16 DIAGNOSIS — R221 Localized swelling, mass and lump, neck: Secondary | ICD-10-CM | POA: Diagnosis not present

## 2024-06-16 DIAGNOSIS — J984 Other disorders of lung: Secondary | ICD-10-CM | POA: Diagnosis not present

## 2024-06-16 DIAGNOSIS — D490 Neoplasm of unspecified behavior of digestive system: Secondary | ICD-10-CM | POA: Diagnosis not present

## 2024-06-16 DIAGNOSIS — R2689 Other abnormalities of gait and mobility: Secondary | ICD-10-CM | POA: Diagnosis not present

## 2024-06-16 DIAGNOSIS — S72002D Fracture of unspecified part of neck of left femur, subsequent encounter for closed fracture with routine healing: Secondary | ICD-10-CM | POA: Diagnosis not present

## 2024-06-16 DIAGNOSIS — R918 Other nonspecific abnormal finding of lung field: Secondary | ICD-10-CM | POA: Diagnosis not present

## 2024-06-16 DIAGNOSIS — M4982 Spondylopathy in diseases classified elsewhere, cervical region: Secondary | ICD-10-CM | POA: Diagnosis not present

## 2024-06-16 DIAGNOSIS — M6281 Muscle weakness (generalized): Secondary | ICD-10-CM | POA: Diagnosis not present

## 2024-06-16 DIAGNOSIS — R55 Syncope and collapse: Secondary | ICD-10-CM | POA: Diagnosis present

## 2024-06-16 DIAGNOSIS — I739 Peripheral vascular disease, unspecified: Secondary | ICD-10-CM | POA: Diagnosis not present

## 2024-06-16 DIAGNOSIS — D649 Anemia, unspecified: Secondary | ICD-10-CM | POA: Diagnosis not present

## 2024-06-16 DIAGNOSIS — I5022 Chronic systolic (congestive) heart failure: Secondary | ICD-10-CM | POA: Diagnosis not present

## 2024-06-16 DIAGNOSIS — R2681 Unsteadiness on feet: Secondary | ICD-10-CM | POA: Diagnosis not present

## 2024-06-16 DIAGNOSIS — Z9181 History of falling: Secondary | ICD-10-CM | POA: Diagnosis not present

## 2024-06-16 DIAGNOSIS — I1310 Hypertensive heart and chronic kidney disease without heart failure, with stage 1 through stage 4 chronic kidney disease, or unspecified chronic kidney disease: Secondary | ICD-10-CM | POA: Diagnosis not present

## 2024-06-16 DIAGNOSIS — N1832 Chronic kidney disease, stage 3b: Secondary | ICD-10-CM | POA: Diagnosis not present

## 2024-06-16 LAB — CBC
HCT: 26.7 % — ABNORMAL LOW (ref 39.0–52.0)
Hemoglobin: 8.5 g/dL — ABNORMAL LOW (ref 13.0–17.0)
MCH: 28.3 pg (ref 26.0–34.0)
MCHC: 31.8 g/dL (ref 30.0–36.0)
MCV: 89 fL (ref 80.0–100.0)
Platelets: 256 K/uL (ref 150–400)
RBC: 3 MIL/uL — ABNORMAL LOW (ref 4.22–5.81)
RDW: 14.2 % (ref 11.5–15.5)
WBC: 7.8 K/uL (ref 4.0–10.5)
nRBC: 0 % (ref 0.0–0.2)

## 2024-06-16 LAB — BASIC METABOLIC PANEL WITH GFR
Anion gap: 9 (ref 5–15)
BUN: 47 mg/dL — ABNORMAL HIGH (ref 8–23)
CO2: 26 mmol/L (ref 22–32)
Calcium: 7.8 mg/dL — ABNORMAL LOW (ref 8.9–10.3)
Chloride: 106 mmol/L (ref 98–111)
Creatinine, Ser: 2.19 mg/dL — ABNORMAL HIGH (ref 0.61–1.24)
GFR, Estimated: 30 mL/min — ABNORMAL LOW
Glucose, Bld: 113 mg/dL — ABNORMAL HIGH (ref 70–99)
Potassium: 3.4 mmol/L — ABNORMAL LOW (ref 3.5–5.1)
Sodium: 141 mmol/L (ref 135–145)

## 2024-06-16 MED ORDER — ENOXAPARIN SODIUM 30 MG/0.3ML IJ SOSY: 30.0000 mg | PREFILLED_SYRINGE | INTRAMUSCULAR | Status: DC

## 2024-06-16 MED ORDER — POTASSIUM CHLORIDE CRYS ER 20 MEQ PO TBCR
40.0000 meq | EXTENDED_RELEASE_TABLET | Freq: Two times a day (BID) | ORAL | Status: DC
  Administered 2024-06-16: 40 meq via ORAL
  Filled 2024-06-16: qty 2

## 2024-06-16 MED ORDER — POLYETHYLENE GLYCOL 3350 17 G PO PACK: 17.0000 g | PACK | Freq: Every day | ORAL | Status: AC

## 2024-06-16 NOTE — TOC Progression Note (Signed)
 Transition of Care [facility]) - Progression Note    Patient Details  Name: Michael Chang MRN: [id number] Date of Birth: Feb 22, 1946  [facility] of [facility]) CM/SW Contact  Bridget Cordella Simmonds, LCSW Phone Number: 06/16/2024, 1:32 PM  Clinical Narrative:  Email from pt sister requesting [facility].    CSW reached out to [facility]: unclear if they will have bed today.  Question about current substance use.  1000: CSW spoke with pt, who is in agreement with Emmalene, reports last substance use more than 10 years ago, [facility] informed.  Bed availability still unclear.    1315: [facility] does have available bed today. SNF auth request submitted in [place] and approved: [id number], 3 days: 9/16-9/18.    Expected Discharge Plan: Skilled Nursing Facility Barriers to Discharge: Insurance Authorization, Continued Medical Work up, SNF Pending bed offer               Expected Discharge Plan and Services In-house Referral: Clinical Social Work     Living arrangements for the past 2 months: Apartment Expected Discharge Date: 06/16/24                                     Social Drivers of Health (SDOH) Interventions SDOH Screenings   Food Insecurity: No Food Insecurity (06/12/2024)  Housing: Low Risk  (06/12/2024)  Transportation Needs: No Transportation Needs (06/12/2024)  Utilities: Not At Risk (06/12/2024)  Alcohol Screen: Low Risk  (10/31/2023)  Depression (PHQ2-9): Low Risk  (03/05/2024)  Financial Resource Strain: High Risk (10/31/2023)  Physical Activity: Sufficiently Active (10/31/2023)  Social Connections: Socially Isolated (06/12/2024)  Stress: No Stress Concern Present (10/31/2023)  Tobacco Use: Low Risk  (06/13/2024)  Health Literacy: Adequate Health Literacy (10/31/2023)    Readmission Risk Interventions     No data to display

## 2024-06-16 NOTE — Discharge Summary (Addendum)
 Physician Discharge Summary  Michael Chang FMW:[id number] DOB: [birth year] DOA: 06/11/2024  PCP: Gretta Comer POUR, NP  Admit date: 06/11/2024 Discharge date: 06/16/2024  Admitted From: Home Disposition:  SNF  Recommendations for Outpatient Follow-up:  Follow up with PCP in 1-2 weeks Please obtain BMP/CBC in one week   Home Health:No Equipment/Devices:None  Discharge Condition:Stable CODE STATUS:Full Diet recommendation: Heart Healthy  Brief/Interim Summary:  78 y.o. male  past medical history of essential hypertension, chronic diastolic dysfunction, history of hepatitis C, with a remote history of heroin use many years ago, chronic kidney disease stage IIIb, coronary artery disease comes into the hospital after fall that happened 2 days prior to admission.  was sitting down he denies any loss of consciousness but he started feeling dizzy and felt from the sitting position to the left side after he fell he could not get up and spent 2 days crawling around the floor in his house, and due to the pain he called EMS was he was able to get to the phone.  He denies any loss of consciousness no change in vision and only dizziness right before the fall. He relates he is also been getting short of breath for the last several months and he has had some dysp of his voice.  And some difficulty swallowing.  Discharge Diagnoses:  Principal Problem:   Closed left hip fracture (HCC) Active Problems:   Chronic kidney disease, stage IIIb (moderate) (HCC)   Essential hypertension   Normocytic anemia   Hyperkalemia   AKI (acute kidney injury) (HCC)   Thrombocytopenia (HCC)   Oropharyngeal mass   Mass of oropharynx  Closed left hip fracture: Orthopedic surgery was consulted status post hemiarthroplasty on 06/14/2024. Weightbearing as tolerated. He will continue Lovenox  for DVT prophylaxis for 30 days. Narcotics have been prescribed. PT evaluated the patient, will need skilled nursing  facility.  Thrombocytopenia: Erroneous lab.  Acute kidney injury on chronic kidney disease stage IIIb: Likely hemodynamic mediated resolved with IV fluids creatinine returned to baseline.  Hyperkalemia: Resolved with IV fluids and Lokelma .  Mild traumatic rhabdomyolysis: Resolved with IV fluids.  Essential hypertension: Natures made to his blood pressure medication.  Normocytic anemia: Hemoglobin remained at baseline.  Oropharyngeal mass biopsy-provenSquamous Cell Carcinoma of Tonsil is a clinically significant diagnosis : CT of the head and neck showed a right sided oropharyngeal mass, ENT was consulted they performed a biopsy on 06/14/2024. Hematology will follow-up as an outpatient on the results.  History of hepatitis C: Noted.  Goals of care: DNR/DNI.  Discharge Instructions  Discharge Instructions     Diet - low sodium heart healthy   Complete by: As directed    Increase activity slowly   Complete by: As directed    No wound care   Complete by: As directed       Allergies as of 06/16/2024   No Known Allergies      Medication List     TAKE these medications    amLODipine  10 MG tablet Commonly known as: NORVASC  Take 10 mg by mouth daily.   aspirin  EC 81 MG tablet Take 1 tablet (81 mg total) by mouth 2 (two) times daily. To prevent blood clots for 30 days after surgery. What changed:  when to take this additional instructions   carvedilol  25 MG tablet Commonly known as: COREG  Take 12.5-25 mg by mouth 2 (two) times daily with a meal. Take 12.5mg  (one-half tablet) in the morning and 25mg  (one tablet) in the evening for a  total daily dose of 37.5mg .   chlorhexidine  4 % external liquid Commonly known as: HIBICLENS  Apply 15 mLs (1 Application total) topically as directed for 30 doses. Use as directed daily for 5 days every other week for 6 weeks.   enoxaparin  30 MG/0.3ML injection Commonly known as: LOVENOX  Inject 0.3 mLs (30 mg total) into the  skin daily.   ferrous sulfate  325 (65 FE) MG tablet Take 325 mg by mouth daily with breakfast.   hydrALAZINE  100 MG tablet Commonly known as: APRESOLINE  Take 100 mg by mouth 2 (two) times daily.   HYDROcodone -acetaminophen  5-325 MG tablet Commonly known as: NORCO/VICODIN Take 1 tablet by mouth every 6 (six) hours as needed for severe pain (pain score 7-10). in hip after surgery   isosorbide  mononitrate 30 MG 24 hr tablet Commonly known as: IMDUR  Take 15-60 mg by mouth 2 (two) times daily. Take 60mg  (two tablets) in the morning and 15mg  (one-half tablet) in the evening for a total daily dose of 75mg .   multivitamin with minerals tablet Take 1 tablet by mouth daily.   mupirocin  ointment 2 % Commonly known as: BACTROBAN  Place 1 Application into the nose 2 (two) times daily for 60 doses. Use as directed 2 times daily for 5 days every other week for 6 weeks.   polyethylene glycol 17 g packet Commonly known as: MIRALAX  / GLYCOLAX  Take 17 g by mouth daily for 7 days.        Follow-up Information     Beverley Evalene BIRCH, MD. Schedule an appointment as soon as possible for a visit in 2 week(s).   Specialty: Orthopedic Surgery Contact information: [address] [place] [address] [phone]                No Known Allergies  Consultations: ENT Orthopedic surgery Hematology   Procedures/Studies: DG Swallowing Func-Speech Pathology Result Date: 06/15/2024 Table formatting from the original result was not included. Modified Barium Swallow Study Patient Details Name: Michael Chang MRN: [id number] Date of Birth: 08-05-1946 Today's Date: 06/15/2024 HPI/PMH: HPI: 78 y.o. male admitted 06/11/24 after fall out of his recliner chair at home, sustaining a mildly displaced subcapital fracture of the  left femoral head and neck junction. CT of the neck, there was a soft tissue mass along the right side of oropharynx, concerning for oropharyngeal carcinoma. Pt  underwent laryngoscopy, direct with biopsy 9/13. Pt s/p left bipolar hemiarthroplasty with posterior approach 9/13. PMHx: essential HTN, chronic diastolic dysfunction, hepatitis C, with a remote history of heroin use many years ago, CKD IIIb, and CAD. Clinical Impression: Clinical Impression: Pt has an oropharyngeal dysphagia with DIGEST score of 3. He has more mild oral symptoms that include weak lingual propulsion and a small collection of oral residue, which he mostly clears spontaneously. Pharyngeally he has a more severe dysphagia, with incomplete velopharyngeal closure, base of tongue retraction, pharyngeal squeeze, and anterior hyoid excursion. This contributes to reduced UES opening and minimal epiglottic deflection, resulting in significant pharyngeal residue. This is diffuse but primarily in the valleculae, and pt does not have awareness of its presence. Multiple swallows help to reduce some of the residue, but it never fully clears. A nectar thick liquids wash also helps to clear some of the residue from purees. Postural strategies, including a chin tuck or head turns, do not impact function. Pt also has reduced laryngeal elevation and laryngeal vestibule closure, and as he is trying to swallow or clear his residue, intermittent aspiration occurs with thin  liquids (PAS 7). Penetration is transient with other consistencies (PAS 2), but becomes more frank when trying to use a nectar thick liquid wash after more solid boluses (PAS 3). Recommend that he start with Dys 1 (puree) diet and nectar thick liquids with strategies including alternating bites/sips and using multiple, effortful swallows. Pt is likely to need reinforcement of strategies before considering advancement, although note that if he is to begin tx for his mass, this may further exacerbate the dysphagia that is already present. SLP f/u is indicated acutely and at next level of care. Factors that may increase risk of adverse event in presence of  aspiration Noe & Lianne 2021): Factors that may increase risk of adverse event in presence of aspiration Noe & Lianne 2021): Poor general health and/or compromised immunity; Frail or deconditioned; Limited mobility; Inadequate oral hygiene; Weak cough Recommendations/Plan: Swallowing Evaluation Recommendations Swallowing Evaluation Recommendations Recommendations: PO diet PO Diet Recommendation: Dysphagia 1 (Pureed); Mildly thick liquids (Level 2, nectar thick) Liquid Administration via: Cup; No straw Medication Administration: Crushed with puree Supervision: Staff to assist with self-feeding; Full supervision/cueing for swallowing strategies Swallowing strategies  : Slow rate; Small bites/sips; effortful swallow; Multiple dry swallows after each bite/sip; Follow solids with liquids Postural changes: Position pt fully upright for meals; Stay upright 30-60 min after meals Oral care recommendations: Oral care BID (2x/day) Caregiver Recommendations: Avoid jello, ice cream, thin soups, popsicles; Remove water pitcher; Have oral suction available Treatment Plan Treatment Plan Treatment recommendations: Therapy as outlined in treatment plan below Follow-up recommendations: Skilled nursing-short term rehab (<3 hours/day) Functional status assessment: Patient has had a recent decline in their functional status and demonstrates the ability to make significant improvements in function in a reasonable and predictable amount of time. Treatment frequency: Min 2x/week Treatment duration: 2 weeks Interventions: Aspiration precaution training; Compensatory techniques; Patient/family education; Trials of upgraded texture/liquids; Oropharyngeal exercises; Diet toleration management by SLP Recommendations Recommendations for follow up therapy are one component of a multi-disciplinary discharge planning process, led by the attending physician.  Recommendations may be updated based on patient status, additional functional  criteria and insurance authorization. Assessment: Orofacial Exam: Orofacial Exam Oral Cavity - Dentition: Missing dentition; Poor condition Orofacial Anatomy: Other (comment) (oropharyngeal mass) Anatomy: Anatomy: Suspected cervical osteophytes; Other (Comment) (oropharyngeal mass per CT Neck and ENT findings) Boluses Administered: Boluses Administered Boluses Administered: Thin liquids (Level 0); Mildly thick liquids (Level 2, nectar thick); Moderately thick liquids (Level 3, honey thick); Puree; Solid  Oral Impairment Domain: Oral Impairment Domain Lip Closure: No labial escape Tongue control during bolus hold: Cohesive bolus between tongue to palatal seal Bolus preparation/mastication: Slow prolonged chewing/mashing with complete recollection Bolus transport/lingual motion: Slow tongue motion Oral residue: Residue collection on oral structures Location of oral residue : Tongue; Palate Initiation of pharyngeal swallow : Valleculae  Pharyngeal Impairment Domain: Pharyngeal Impairment Domain Soft palate elevation: Trace column of contrast or air between SP and PW Laryngeal elevation: Partial superior movement of thyroid  cartilage/partial approximation of arytenoids to epiglottic petiole Anterior hyoid excursion: Partial anterior movement Epiglottic movement: Partial inversion Laryngeal vestibule closure: Incomplete, narrow column air/contrast in laryngeal vestibule Pharyngeal stripping wave : Present - diminished Pharyngeal contraction (A/P view only): N/A Pharyngoesophageal segment opening: Partial distention/partial duration, partial obstruction of flow Tongue base retraction: Wide column of contrast or air between tongue base and PPW Pharyngeal residue: Minimal to no pharyngeal clearance Location of pharyngeal residue: Pharyngeal wall; Pyriform sinuses; Tongue base; Valleculae; Diffuse (>3 areas)  Esophageal Impairment Domain: Esophageal Impairment Domain  Esophageal clearance upright position: Complete clearance,  esophageal coating Pill: Pill Consistency administered: -- (deferred) Penetration/Aspiration Scale Score: Penetration/Aspiration Scale Score 2.  Material enters airway, remains ABOVE vocal cords then ejected out: Moderately thick liquids (Level 3, honey thick); Puree 3.  Material enters airway, remains ABOVE vocal cords and not ejected out: Mildly thick liquids (Level 2, nectar thick) 7.  Material enters airway, passes BELOW cords and not ejected out despite cough attempt by patient: Thin liquids (Level 0) Compensatory Strategies: Compensatory Strategies Compensatory strategies: Yes Effortful swallow: Effective Effective Effortful Swallow: Mildly thick liquid (Level 2, nectar thick); Puree Multiple swallows: Effective Effective Multiple Swallows: Mildly thick liquid (Level 2, nectar thick); Puree Chin tuck: Ineffective Ineffective Chin Tuck: Puree Liquid wash: Effective Effective Liquid Wash: Mildly thick liquid (Level 2, nectar thick); Puree Left head turn: Ineffective Ineffective Left Head Turn: Puree Right head turn: Ineffective Ineffective Right Head Turn: Puree   General Information: Caregiver present: No  Diet Prior to this Study: Regular; Thin liquids (Level 0)   Temperature : Normal   Respiratory Status: WFL   Supplemental O2: None (Room air)   History of Recent Intubation: Yes  Behavior/Cognition: Alert; Cooperative; Pleasant mood; Other (Comment) (hard of hearing) Self-Feeding Abilities: Able to self-feed Baseline vocal quality/speech: Dysphonic Volitional Cough: Able to elicit Volitional Swallow: Able to elicit Exam Limitations: No limitations Goal Planning: Prognosis for improved oropharyngeal function: Good Barriers to Reach Goals: Overall medical prognosis No data recorded Patient/Family Stated Goal: To eat applesauce Consulted and agree with results and recommendations: Patient Pain: Pain Assessment Pain Assessment: Faces Pain Score: 7 Faces Pain Scale: 0 Pain Location: L hip Pain Descriptors /  Indicators: Operative site guarding; Grimacing; Discomfort; Aching End of Session: Start Time:SLP Start Time (ACUTE ONLY): 1017 Stop Time: SLP Stop Time (ACUTE ONLY): 1036 Time Calculation:SLP Time Calculation (min) (ACUTE ONLY): 19 min Charges: SLP Evaluations $ SLP Speech Visit: 1 Visit SLP Evaluations $BSS Swallow: 1 Procedure $MBS Swallow: 1 Procedure SLP visit diagnosis: SLP Visit Diagnosis: Dysphagia, oropharyngeal phase (R13.12) Past Medical History: Past Medical History: Diagnosis Date  Anemia   Anemia in chronic kidney disease 03/02/2020  CAD (coronary artery disease)   Cataract (lens) fragments in eye following cataract surgery, right eye 03/15/2020  Chronic combined systolic (congestive) and diastolic (congestive) heart failure (HCC)   Chronic ulcer of right lower extremity (HCC)   CKD (chronic kidney disease), stage III (HCC)   Essential hypertension   HLD (hyperlipidemia)   Near syncope 02/14/2020  NSTEMI (non-ST elevated myocardial infarction) (HCC)   PVD (peripheral vascular disease) (HCC)  Past Surgical History: Past Surgical History: Procedure Laterality Date  COLONOSCOPY    x2 Leita SAILOR., M.A. CCC-SLP Acute Rehabilitation Services Office: [phone] Secure chat preferred 06/15/2024, 11:25 AM  CT Soft Tissue Neck Wo Contrast Result Date: 06/11/2024 EXAM: CT NECK WITHOUT CONTRAST 06/11/2024 03:09:19 PM TECHNIQUE: CT of the neck was performed without the administration of intravenous contrast. Multiplanar reformatted images are provided for review. Automated exposure control, iterative reconstruction, and/or weight based adjustment of the mA/kV was utilized to reduce the radiation dose to as low as reasonably achievable. COMPARISON: None available. CLINICAL HISTORY: Soft tissue mass of right tonsil. FINDINGS: AERODIGESTIVE TRACT: Soft tissue mass anchored along the right side of the oropharynx measures 3.9 x 3.3 x 3.8 cm. Lesion extends into the glosso tonsillar sulcus. The left palatine tonsil was  unremarkable. SALIVARY GLANDS: The left submandibular gland is unremarkable. Asymmetric right submandibular soft tissue likely reflects enlarged nodes. THYROID : Unremarkable. LYMPH  NODES: No significant lower adenopathy is present. SOFT TISSUES: No mass or fluid collection. BRAIN, ORBITS, SINUSES AND MASTOIDS: No acute abnormality. LUNGS AND MEDIASTINUM: No acute abnormality. BONES: No focal bone abnormality. IMPRESSION: 1. Soft tissue mass anchored along the right side of the oropharynx, measuring 3.9 x 3.3 x 3.8 cm, extending into the glossotonsillar sulcus. 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 Asymmetric right submandibular soft tissue, likely reflecting enlarged nodes and worrisome for metastatic disease . Electronically signed by: Lonni Necessary MD 06/11/2024 03:25 PM EDT RP Workstation: [id number]   DG Hip Unilat With Pelvis 2-3 Views Left Result Date: 06/11/2024 CLINICAL DATA:  Pain after fall 2 days ago. EXAM: DG HIP (WITH OR WITHOUT PELVIS) 2-3V LEFT COMPARISON:  None Available. FINDINGS: Acute impacted and mildly displaced subcapital fracture of the left femoral head and neck junction. No dislocation. Moderate to advanced arthritic changes of the right hip with advanced joint space narrowing. Sacroiliac joints and pubic symphysis appear anatomically aligned. IMPRESSION: 1. Acute impacted and mildly displaced subcapital fracture of the left femoral head and neck junction. 2. Moderate to advanced arthritic changes of the right hip. Electronically Signed   By: Harrietta Sherry M.D.   On: 06/11/2024 14:26   DG Knee 1-2 Views Left Result Date: 06/11/2024 CLINICAL DATA:  Pain after fall two days ago. EXAM: LEFT KNEE - 1-2 VIEW COMPARISON:  None Available. FINDINGS: No evidence of acute fracture, dislocation, or joint effusion. Moderate medial femorotibial compartment predominant osteoarthritis. Patellar enthesopathy. Soft tissues are unremarkable. IMPRESSION: 1. No acute osseous  abnormality. 2. Moderate medial femorotibial compartment predominant osteoarthritis. Electronically Signed   By: Harrietta Sherry M.D.   On: 06/11/2024 14:22   CT Head Wo Contrast Result Date: 06/11/2024 EXAM: CT HEAD WITHOUT CONTRAST 06/11/2024 01:45:49 PM TECHNIQUE: CT of the head was performed without the administration of intravenous contrast. Automated exposure control, iterative reconstruction, and/or weight based adjustment of the mA/kV was utilized to reduce the radiation dose to as low as reasonably achievable. COMPARISON: CT head 02/14/2020 CLINICAL HISTORY: Polytrauma, blunt. Pt BIB EMS with CC of left hip pain s/p GLF after possible near syncopal episode 2 days ago. Pt reported he has been unable to get off the floor and spent two days crawling on floor before his sister found him today. AOx4 FINDINGS: BRAIN AND VENTRICLES: Nonspecific hypoattenuation in the periventricular and subcortical white matter, most likely representing chronic microvascular ischemic changes. Remote lacunar infarcts in the left basal ganglia and in the bilateral corona radiata. Encephalomalacia in the medial left occipital lobe suggestive of remote infarct. Additional small remote infarcts in the posterior left cerebellum. Mild parenchymal volume loss. No acute hemorrhage. No evidence of acute infarct. No hydrocephalus. No extra-axial collection. No mass effect or midline shift. ORBITS: Bilateral lens replacement. Elongation of the globes, particularly on the left, suggestive of axial myopia. No acute abnormality. SINUSES: Mucosal thickening in the frontal sinuses and ethmoid sinuses bilaterally. Additional mucosal thickening and possible mucous retention cysts in the inferior aspect of the maxillary sinuses. No acute abnormality. SOFT TISSUES AND SKULL: There is possible asymmetric soft tissue involving the right palatine tonsil and soft palate appreciated on series 2 image 2. No acute soft tissue abnormality. No skull  fracture. IMPRESSION: 1. No acute intracranial abnormality. 2. Mild-to-moderate chronic microvascular ischemic changes. Mild parenchymal volume loss. 3. Multiple remote infarcts as above. 4. Asymmetric soft tissue involving the right palatine tonsil and soft palate, partially visualized. Recommend correlation with direct visualization and consider CT  neck soft tissue with contrast for further evaluation. Electronically signed by: Donnice Mania MD 06/11/2024 02:14 PM EDT RP Workstation: [id number]   CT Cervical Spine Wo Contrast Result Date: 06/11/2024 CLINICAL DATA:  Polytrauma, blunt Near syncopal episode 2 days ago.  Found down. EXAM: CT CERVICAL SPINE WITHOUT CONTRAST TECHNIQUE: Multidetector CT imaging of the cervical spine was performed without intravenous contrast. Multiplanar CT image reconstructions were also generated. RADIATION DOSE REDUCTION: This exam was performed according to the departmental dose-optimization program which includes automated exposure control, adjustment of the mA and/or kV according to patient size and/or use of iterative reconstruction technique. COMPARISON:  None Available. FINDINGS: Alignment: Normal. Skull base and vertebrae: No evidence of acute cervical spine fracture or traumatic subluxation. Soft tissues and spinal canal: No prevertebral fluid or swelling. No visible canal hematoma. Disc levels: Multilevel relatively mild spondylosis with disc space narrowing and uncinate spurring from C3-4 through C6-7. No evidence of large disc herniation or high-grade spinal stenosis. No more than mild foraminal narrowing identified. Upper chest: Clear lung apices. Other: Probable small mucous retention cysts in the maxillary sinuses bilaterally. Prominent mylohyoid ossification, left greater than right. IMPRESSION: 1. No evidence of acute cervical spine fracture, traumatic subluxation or static signs of instability. 2. Mild cervical spondylosis. Electronically Signed   By: Elsie Perone M.D.   On: 06/11/2024 14:04   (Echo, Carotid, EGD, Colonoscopy, ERCP)    Subjective: No complaints  Discharge Exam: Vitals:   06/16/24 0322 06/16/24 0746  BP: (!) 167/103 (!) 158/89  Pulse: 85 91  Resp: 18 16  Temp: 98.4 F (36.9 C) 97.9 F (36.6 C)  SpO2: 100% 96%   Vitals:   06/15/24 1939 06/15/24 2025 06/16/24 0322 06/16/24 0746  BP: (!) 81/55 99/65 (!) 167/103 (!) 158/89  Pulse: 85 75 85 91  Resp:  18 18 16   Temp: 98.5 F (36.9 C) 98.5 F (36.9 C) 98.4 F (36.9 C) 97.9 F (36.6 C)  TempSrc:   Oral   SpO2: 100% 100% 100% 96%  Weight:      Height:        General: Pt is alert, awake, not in acute distress Cardiovascular: RRR, S1/S2 +, no rubs, no gallops Respiratory: CTA bilaterally, no wheezing, no rhonchi Abdominal: Soft, NT, ND, bowel sounds + Extremities: no edema, no cyanosis    The results of significant diagnostics from this hospitalization (including imaging, microbiology, ancillary and laboratory) are listed below for reference.     Microbiology: Recent Results (from the past 240 hours)  Surgical pcr screen     Status: Abnormal   Collection Time: 06/11/24  6:14 PM   Specimen: Nasal Mucosa; Nasal Swab  Result Value Ref Range Status   MRSA, PCR NEGATIVE NEGATIVE Final   Staphylococcus aureus POSITIVE (A) NEGATIVE Final    Comment: (NOTE) The Xpert SA Assay (FDA approved for NASAL specimens in patients 53 years of age and older), is one component of a comprehensive surveillance program. It is not intended to diagnose infection nor to guide or monitor treatment. Performed at [facility], [address], [place], [place] [address]      Labs: BNP (last 3 results) No results for input(s): BNP in the last 8760 hours. Basic Metabolic Panel: Recent Labs  Lab 06/11/24 2301 06/12/24 0453 06/13/24 0350 06/13/24 1254 06/14/24 0739 06/15/24 0428 06/16/24 0436  NA  --    < > 141 140 137 139 141  K  --    < > 3.9 4.0  3.7 3.5 3.4*   CL  --    < > 107 107 100 101 106  CO2  --    < > 23 21* 26 24 26   GLUCOSE  --    < > 106* 114* 108* 86 113*  BUN  --    < > 53* 47* 55* 46* 47*  CREATININE  --    < > 2.29* 2.13* 2.73* 2.23* 2.19*  CALCIUM  --    < > 7.8* 7.9* 7.9* 8.2* 7.8*  MG 2.2  --   --   --   --   --   --    < > = values in this interval not displayed.   Liver Function Tests: Recent Labs  Lab 06/11/24 1413 06/13/24 1254  AST 70* 42*  ALT 27 25  ALKPHOS 47 48  BILITOT 1.6* 0.7  PROT 7.7 6.5  ALBUMIN 2.9* 2.5*   Recent Labs  Lab 06/11/24 1413  LIPASE 37   No results for input(s): AMMONIA in the last 168 hours. CBC: Recent Labs  Lab 06/11/24 1413 06/11/24 1420 06/12/24 1224 06/13/24 0350 06/14/24 0739 06/15/24 0428 06/16/24 0436  WBC 7.5   < > 9.1 11.5* 11.5* 9.1 7.8  NEUTROABS 6.0  --  8.2*  --   --   --   --   HGB 8.9*   < > 10.1* 8.5* 8.7* 9.8* 8.5*  HCT 27.6*   < > 31.3* 26.7* 27.5* 30.6* 26.7*  MCV 92.9   < > 89.9 91.4 90.5 88.2 89.0  PLT 5*   < > 353 271 240 264 256   < > = values in this interval not displayed.   Cardiac Enzymes: Recent Labs  Lab 06/11/24 1413 06/12/24 0801  CKTOTAL 1,667* 784*   BNP: Invalid input(s): POCBNP CBG: No results for input(s): GLUCAP in the last 168 hours. D-Dimer No results for input(s): DDIMER in the last 72 hours. Hgb A1c No results for input(s): HGBA1C in the last 72 hours. Lipid Profile No results for input(s): CHOL, HDL, LDLCALC, TRIG, CHOLHDL, LDLDIRECT in the last 72 hours. Thyroid  function studies No results for input(s): TSH, T4TOTAL, T3FREE, THYROIDAB in the last 72 hours.  Invalid input(s): FREET3 Anemia work up No results for input(s): VITAMINB12, FOLATE, FERRITIN, TIBC, IRON, RETICCTPCT in the last 72 hours. Urinalysis No results found for: COLORURINE, APPEARANCEUR, LABSPEC, PHURINE, GLUCOSEU, HGBUR, BILIRUBINUR, KETONESUR, PROTEINUR, UROBILINOGEN, NITRITE,  LEUKOCYTESUR Sepsis Labs Recent Labs  Lab 06/13/24 0350 06/14/24 0739 06/15/24 0428 06/16/24 0436  WBC 11.5* 11.5* 9.1 7.8   Microbiology Recent Results (from the past 240 hours)  Surgical pcr screen     Status: Abnormal   Collection Time: 06/11/24  6:14 PM   Specimen: Nasal Mucosa; Nasal Swab  Result Value Ref Range Status   MRSA, PCR NEGATIVE NEGATIVE Final   Staphylococcus aureus POSITIVE (A) NEGATIVE Final    Comment: (NOTE) The Xpert SA Assay (FDA approved for NASAL specimens in patients 52 years of age and older), is one component of a comprehensive surveillance program. It is not intended to diagnose infection nor to guide or monitor treatment. Performed at [facility], [address] [place], [place] [address]      Time coordinating discharge: Over 35 minutes  SIGNED:   Erle Odell Castor, MD  [facility] 06/16/2024, 7:57 AM Pager   If 7PM-7AM, please contact night-coverage [email or web address] Password [email or web address]

## 2024-06-16 NOTE — TOC Transition Note (Signed)
 Transition of Care [facility]) - Discharge Note   Patient Details  Name: Michael Chang MRN: [id number] Date of Birth: 1946-03-04  Transition of Care [facility]) CM/SW Contact:  Bridget Cordella Simmonds, LCSW Phone Number: 06/16/2024, 2:09 PM   Clinical Narrative:   Pt discharging to [facility], room 806.  RN report to [phone].  PTAR called 1400.     Final next level of care: Skilled Nursing Facility Barriers to Discharge: Barriers Resolved   Patient Goals and CMS Choice            Discharge Placement              Patient chooses bed at: [facility] Patient to be transferred to facility by: ptar Name of family member notified: sister Felicia Patient and family notified of of transfer: 06/16/24  Discharge Plan and Services Additional resources added to the After Visit Summary for   In-house Referral: Clinical Social Work                                   Social Drivers of Health (SDOH) Interventions SDOH Screenings   Food Insecurity: No Food Insecurity (06/12/2024)  Housing: Low Risk  (06/12/2024)  Transportation Needs: No Transportation Needs (06/12/2024)  Utilities: Not At Risk (06/12/2024)  Alcohol Screen: Low Risk  (10/31/2023)  Depression (PHQ2-9): Low Risk  (03/05/2024)  Financial Resource Strain: High Risk (10/31/2023)  Physical Activity: Sufficiently Active (10/31/2023)  Social Connections: Socially Isolated (06/12/2024)  Stress: No Stress Concern Present (10/31/2023)  Tobacco Use: Low Risk  (06/13/2024)  Health Literacy: Adequate Health Literacy (10/31/2023)     Readmission Risk Interventions     No data to display

## 2024-06-16 NOTE — Plan of Care (Signed)
  Problem: Education: Goal: Knowledge of General Education information will improve Description: Including pain rating scale, medication(s)/side effects and non-pharmacologic comfort measures Outcome: Progressing   Problem: Clinical Measurements: Goal: Ability to maintain clinical measurements within normal limits will improve Outcome: Progressing   Problem: Elimination: Goal: Will not experience complications related to bowel motility Outcome: Progressing Goal: Will not experience complications related to urinary retention Outcome: Progressing   Problem: Safety: Goal: Ability to remain free from injury will improve Outcome: Progressing

## 2024-06-17 ENCOUNTER — Other Ambulatory Visit: Payer: Self-pay

## 2024-06-17 DIAGNOSIS — N179 Acute kidney failure, unspecified: Secondary | ICD-10-CM | POA: Diagnosis not present

## 2024-06-17 DIAGNOSIS — D649 Anemia, unspecified: Secondary | ICD-10-CM | POA: Diagnosis not present

## 2024-06-17 DIAGNOSIS — I1310 Hypertensive heart and chronic kidney disease without heart failure, with stage 1 through stage 4 chronic kidney disease, or unspecified chronic kidney disease: Secondary | ICD-10-CM | POA: Diagnosis not present

## 2024-06-17 DIAGNOSIS — N1832 Chronic kidney disease, stage 3b: Secondary | ICD-10-CM | POA: Diagnosis not present

## 2024-06-17 DIAGNOSIS — D490 Neoplasm of unspecified behavior of digestive system: Secondary | ICD-10-CM | POA: Diagnosis not present

## 2024-06-17 DIAGNOSIS — C099 Malignant neoplasm of tonsil, unspecified: Secondary | ICD-10-CM

## 2024-06-17 LAB — SURGICAL PATHOLOGY

## 2024-06-17 NOTE — Progress Notes (Signed)
 Oncology Nurse Navigator Documentation   I called Michael Chang to introduce myself as his nurse navigator at the [facility]. I left my direct contact information and asked him to call me back when he was available.  Delon Jefferson RN, BSN, OCN Head & Neck Oncology Nurse Navigator [facility] at [facility] Phone # [phone]  Fax # [phone]

## 2024-06-19 ENCOUNTER — Telehealth: Payer: Self-pay | Admitting: Cardiovascular Disease

## 2024-06-19 ENCOUNTER — Other Ambulatory Visit: Payer: Self-pay

## 2024-06-19 DIAGNOSIS — J392 Other diseases of pharynx: Secondary | ICD-10-CM | POA: Diagnosis not present

## 2024-06-19 DIAGNOSIS — E43 Unspecified severe protein-calorie malnutrition: Secondary | ICD-10-CM | POA: Diagnosis not present

## 2024-06-19 DIAGNOSIS — Z9181 History of falling: Secondary | ICD-10-CM | POA: Diagnosis not present

## 2024-06-19 DIAGNOSIS — C099 Malignant neoplasm of tonsil, unspecified: Secondary | ICD-10-CM

## 2024-06-19 DIAGNOSIS — M6281 Muscle weakness (generalized): Secondary | ICD-10-CM | POA: Diagnosis not present

## 2024-06-19 DIAGNOSIS — R2689 Other abnormalities of gait and mobility: Secondary | ICD-10-CM | POA: Diagnosis not present

## 2024-06-19 DIAGNOSIS — I5032 Chronic diastolic (congestive) heart failure: Secondary | ICD-10-CM | POA: Diagnosis not present

## 2024-06-19 DIAGNOSIS — M4982 Spondylopathy in diseases classified elsewhere, cervical region: Secondary | ICD-10-CM | POA: Diagnosis not present

## 2024-06-19 DIAGNOSIS — S72002D Fracture of unspecified part of neck of left femur, subsequent encounter for closed fracture with routine healing: Secondary | ICD-10-CM | POA: Diagnosis not present

## 2024-06-19 NOTE — Telephone Encounter (Signed)
 Pt c/o BP issue: STAT if pt c/o blurred vision, one-sided weakness or slurred speech.  STAT if BP is GREATER than 180/120 TODAY.  STAT if BP is LESS than 90/60 and SYMPTOMATIC TODAY  1. What is your BP concern? Low BP   2. Have you taken any BP medication today?yes  3. What are your last 5 BP readings? 79/53 yesterday  4. Are you having any other symptoms (ex. Dizziness, headache, blurred vision, passed out)? no  If you need to schedule appt pt is at [facility] [phone]

## 2024-06-19 NOTE — Telephone Encounter (Signed)
 Called Mimi nurse at [facility] where patient is staying right now. She is going to check patient's BP right now and call our office back.

## 2024-06-22 NOTE — Telephone Encounter (Signed)
 Spoke with someone a [facility] officer, [facility] who stated nurse Vernell is who I need to speak with regarding the pt but that she is on break and will call us  back. Office number provided.

## 2024-06-22 NOTE — Telephone Encounter (Signed)
 Pt c/o BP issue: STAT if pt c/o blurred vision, one-sided weakness or slurred speech.  STAT if BP is GREATER than 180/120 TODAY.  STAT if BP is LESS than 90/60 and SYMPTOMATIC TODAY  1. What is your BP concern? [facility] calling back to make an appt and give bp readings   2. Have you taken any BP medication today?not sure but she states pt has been refusing to take his hydralazine    3. What are your last 5 BP readings? 9/21: 134/76 9/20:  105/56  9/18: 182/100   4. Are you having any other symptoms (ex. Dizziness, headache, blurred vision, passed out)? Dizziness

## 2024-06-23 DIAGNOSIS — D649 Anemia, unspecified: Secondary | ICD-10-CM | POA: Diagnosis not present

## 2024-06-23 DIAGNOSIS — I5022 Chronic systolic (congestive) heart failure: Secondary | ICD-10-CM | POA: Diagnosis not present

## 2024-06-23 DIAGNOSIS — Z9181 History of falling: Secondary | ICD-10-CM | POA: Diagnosis not present

## 2024-06-23 DIAGNOSIS — E43 Unspecified severe protein-calorie malnutrition: Secondary | ICD-10-CM | POA: Diagnosis not present

## 2024-06-23 DIAGNOSIS — I130 Hypertensive heart and chronic kidney disease with heart failure and stage 1 through stage 4 chronic kidney disease, or unspecified chronic kidney disease: Secondary | ICD-10-CM | POA: Diagnosis not present

## 2024-06-23 DIAGNOSIS — S72002D Fracture of unspecified part of neck of left femur, subsequent encounter for closed fracture with routine healing: Secondary | ICD-10-CM | POA: Diagnosis not present

## 2024-06-23 DIAGNOSIS — R221 Localized swelling, mass and lump, neck: Secondary | ICD-10-CM | POA: Diagnosis not present

## 2024-06-23 DIAGNOSIS — M6281 Muscle weakness (generalized): Secondary | ICD-10-CM | POA: Diagnosis not present

## 2024-06-23 DIAGNOSIS — R2689 Other abnormalities of gait and mobility: Secondary | ICD-10-CM | POA: Diagnosis not present

## 2024-06-23 DIAGNOSIS — I5032 Chronic diastolic (congestive) heart failure: Secondary | ICD-10-CM | POA: Diagnosis not present

## 2024-06-23 DIAGNOSIS — J392 Other diseases of pharynx: Secondary | ICD-10-CM | POA: Diagnosis not present

## 2024-06-23 DIAGNOSIS — I739 Peripheral vascular disease, unspecified: Secondary | ICD-10-CM | POA: Diagnosis not present

## 2024-06-23 DIAGNOSIS — M4982 Spondylopathy in diseases classified elsewhere, cervical region: Secondary | ICD-10-CM | POA: Diagnosis not present

## 2024-06-23 DIAGNOSIS — N1832 Chronic kidney disease, stage 3b: Secondary | ICD-10-CM | POA: Diagnosis not present

## 2024-06-24 ENCOUNTER — Telehealth: Payer: Self-pay | Admitting: Radiation Oncology

## 2024-06-24 NOTE — Telephone Encounter (Signed)
 9/24 @ 1:54 pm Left voicemail for patient to call our office to be sch for consult.

## 2024-06-24 NOTE — Progress Notes (Unsigned)
 Oncology Nurse Navigator Documentation    I received a return phone call from Mr. Aggarwal' sister [name]. She explained that Mr. Belloso has asked her to communicate with his medical providers for him. She is listed as his emergency contact as well. I explained to [facility] that Dr. Autumn the medical oncologist that saw him while in the hospital would like a PET scan done before he is seen on 07/01/24 for follow up after his hospital stay to discuss treatment for his new head and neck cancer. Felicia said she had spoken with Mr. Lacey and he does not want to receive any type of radioactive dye for imaging, which means he cannot have a PET. I discussed this with Dr. Autumn and he said that while the PET would be ideal a CT chest could be completed. I called Felicia back and she and Mr. Bontrager are agreeable to a CT chest with contrast which will be done on 06/30/24. Mr. Piechota will also see Dr. Izell for consult after the 06/30/24 CT chest.  Delon Jefferson RN, BSN, OCN Head & Neck Oncology Nurse Navigator [facility] at [facility] Phone # [phone]  Fax # [phone]

## 2024-06-24 NOTE — Telephone Encounter (Signed)
 Left message for nurse to call us  back. Vernell is not working today.

## 2024-06-25 ENCOUNTER — Telehealth: Payer: Self-pay | Admitting: Radiation Oncology

## 2024-06-25 NOTE — Telephone Encounter (Signed)
 9/25 @ 8:48 am Called and spoke to patient and Left message with Delon [facility], transportation coordinator want be in till 12:00 pm today.  Will call back to schedule patient for consult.

## 2024-06-26 DIAGNOSIS — E43 Unspecified severe protein-calorie malnutrition: Secondary | ICD-10-CM | POA: Diagnosis not present

## 2024-06-26 DIAGNOSIS — I130 Hypertensive heart and chronic kidney disease with heart failure and stage 1 through stage 4 chronic kidney disease, or unspecified chronic kidney disease: Secondary | ICD-10-CM | POA: Diagnosis not present

## 2024-06-26 DIAGNOSIS — I5032 Chronic diastolic (congestive) heart failure: Secondary | ICD-10-CM | POA: Diagnosis not present

## 2024-06-26 DIAGNOSIS — R2689 Other abnormalities of gait and mobility: Secondary | ICD-10-CM | POA: Diagnosis not present

## 2024-06-26 DIAGNOSIS — M4982 Spondylopathy in diseases classified elsewhere, cervical region: Secondary | ICD-10-CM | POA: Diagnosis not present

## 2024-06-26 DIAGNOSIS — S72002D Fracture of unspecified part of neck of left femur, subsequent encounter for closed fracture with routine healing: Secondary | ICD-10-CM | POA: Diagnosis not present

## 2024-06-26 DIAGNOSIS — J392 Other diseases of pharynx: Secondary | ICD-10-CM | POA: Diagnosis not present

## 2024-06-26 DIAGNOSIS — Z9181 History of falling: Secondary | ICD-10-CM | POA: Diagnosis not present

## 2024-06-26 DIAGNOSIS — M6281 Muscle weakness (generalized): Secondary | ICD-10-CM | POA: Diagnosis not present

## 2024-06-26 NOTE — Addendum Note (Signed)
 Addended by: Lexxus Underhill W on: 06/26/2024 10:52 AM   Modules accepted: Orders

## 2024-06-26 NOTE — Telephone Encounter (Signed)
 Spoke with nurse rachel, she reports the patient is refusing to take the hydralazine  but his blood pressure is running good. 120's. They will call back with concerns if his blood pressure elevates.

## 2024-06-29 NOTE — Assessment & Plan Note (Signed)
 Ischemic CM. Ad[place] in [place] with [place], NSTEMI in 2021. EF was 40-45 at that time. EF improved to normal here in 01/2020. He is weak and limited by his recent hip Fx. Volume status seems stable. -Continue Carvedilol  12.5 mg in the AM and 25 mg in the PM -Decrease Imdur  to 60 mg once daily -Remain off of Hydralazine  for now given issues with low BP -Consider resuming Hydralazine  if BP is uncontrolled.

## 2024-06-29 NOTE — Progress Notes (Signed)
 OFFICE NOTE:    Date:  06/30/2024  ID:  Ebenezer Mccaskey, DOB March 12, 1946, MRN [id number] PCP: Gretta Comer POUR, NP  [facility] Providers Cardiologist:  Deatrice Cage, MD        (HFpEF) heart failure with preserved ejection fraction  [place] ([place] - EF 40-45  TTE 02/14/20: EF 55-60, no RWMA, severe LVH, Gr 1 DD, no RWMA, NL RVSF, mild LAE  Coronary artery disease  Hx of NSTEMI 11/2019 tx medically ([place]) Initial presentation w syncope  Peripheral arterial disease Hx of LE ulceration; LE Arterial US  11/2019 ([place]) normal  Chronic kidney disease  Hypertension Hyperlipidemia  Anemia  Hx of UGI bleed  Hep C  Vision impairment  Tonsillar Squamous Cell CA DNR (Do Not Resuscitate)/DNI        Discussed the use of AI scribe software for clinical note transcription with the patient, who gave verbal consent to proceed. History of Present Illness Michael Chang is a 78 y.o. male who returns for post hospital follow up. He was last seen by Dr. Cage 02/25/24.  He was admitted 9/11-9/16 after a fall at home. Notes indicate he did not lose consciousness but was dizzy and fell out of a chair onto his L side. He could not get up and was down for 2 days before admission to the hospital. He suffered a L hip Fx s/p THR. He was given IVFs to manage mild rhabdomyolysis, AKI, hyperK+. One hsTrop was checked and elevated at 108.  EKG 06/13/2024 demonstrated no acute ST-T wave changes.  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 Bx was c/w tonsillar squamous cell CA. He has follow up w Oncology 07/01/24.   He is here with his sister. He is currently at [facility] and will be DC soon. The fall occurred while he was watching TV and tipped over in a chair. He was unable to get up due to the fracture and crawled around his apartment for two days before being found. He recalls the fall and does not believe he lost consciousness, although he felt dizzy prior to the fall. Similar episodes  have occurred in the past without loss of consciousness. He was previously on hydralazine  but stopped taking it, and his blood pressure is being monitored at his current facility. He has not had chest discomfort, shortness of breath, or swelling. He experiences some lightheadedness when standing.     ROS-See HPI     Studies Reviewed:       03/05/2024: Total cholesterol 135, HDL 39.3, LDL 79, triglycerides 85 06/13/2024: ALT 25 06/16/2024: K 3.4, creatinine 2.19, eGFR 30, Hgb 8.5         Physical Exam:  VS:  BP 112/71   Pulse 71   Ht 6' 1 (1.854 m)   Wt 133 lb (60.3 kg)   SpO2 96%   BMI 17.55 kg/m        Wt Readings from Last 3 Encounters:  06/30/24 133 lb (60.3 kg)  06/13/24 143 lb 4.8 oz (65 kg)  03/05/24 143 lb (64.9 kg)    Constitutional:      Appearance: Not in distress. Frail.  Neck:     Vascular: No JVR.  Pulmonary:     Breath sounds: Normal breath sounds. No wheezing. No rales.  Cardiovascular:     Normal rate. Regular rhythm.     Murmurs: There is no murmur.  Edema:    Peripheral edema absent.  Abdominal:  Palpations: Abdomen is soft.       Assessment and Plan:    Assessment & Plan Syncope, unspecified syncope type He had a near syncopal vs syncopal episode that led to his fall. He feels like he remembers falling. Given his hx, I have recommended an echocardiogram to r/o structural heart disease and a 2 week monitor to r/o significant arrhythmias. He had low BPs at home prior to his event. He has not been taking hydralazine  while at [facility]. His BPs are optimal currently. Question if he was hypotensive, which led to his fall. I would remain off of Hydralazine  for now and simplify his Imdur  to avoid recurrent hypotension. -Echocardiogram  -2 week Zio XT monitor -Decrease Imdur  to 60 mg once daily  -Follow up 6 mos or sooner if needed. Chronic heart failure with preserved ejection fraction (HCC) Ischemic CM. Ad[place] in [place] with [place]F, NSTEMI in 2021. EF  was 40-45 at that time. EF improved to normal here in 01/2020. He is weak and limited by his recent hip Fx. Volume status seems stable. -Continue Carvedilol  12.5 mg in the AM and 25 mg in the PM -Decrease Imdur  to 60 mg once daily -Remain off of Hydralazine  for now given issues with low BP -Consider resuming Hydralazine  if BP is uncontrolled.  Coronary artery disease involving native heart without angina pectoris, unspecified vessel or lesion type Hx of NSTEMI in 11/2019. He was treated medically at that time. Recent admission with ?syncope, fall, hip Fx. He had AKI and his initial hsTrop was mildly elevated. This was likely due to demand ischemia. He has not had chest pain to suggest angina. -Echocardiogram -Continue ASA 81 mg twice daily (twice daily dosing due to hip surgery) -Continue Carvedilol  12.5 mg in the morning, 25 mg in the evening Essential hypertension BP seems to be well controlled. He likely had hypotension that led to his fall. He is no longer on Hydralazine .  -Continue Amlodipine  10 mg once daily, Carvedilol  12.5 mg in the AM and 25 mg in PM -Decrease Imdur  to 60 mg once daily  Primary tonsillar squamous cell carcinoma (HCC) Bx done during recent hospital admission. He sees Oncology 10/1.           Dispo:  Return in about 6 months (around 12/28/2024) for Routine Follow Up w/ Dr. Darron.  Signed, Glendia Ferrier, PA-C

## 2024-06-29 NOTE — Assessment & Plan Note (Signed)
 Hx of NSTEMI in 11/2019. He was treated medically at that time. Recent admission with ?syncope, fall, hip Fx. He had AKI and his initial hsTrop was mildly elevated. This was likely due to demand ischemia. He has not had chest pain to suggest angina. -Echocardiogram -Continue ASA 81 mg twice daily (twice daily dosing due to hip surgery) -Continue Carvedilol  12.5 mg in the morning, 25 mg in the evening

## 2024-06-30 ENCOUNTER — Ambulatory Visit (INDEPENDENT_AMBULATORY_CARE_PROVIDER_SITE_OTHER): Admitting: Physician Assistant

## 2024-06-30 ENCOUNTER — Ambulatory Visit

## 2024-06-30 ENCOUNTER — Encounter: Payer: Self-pay | Admitting: Physician Assistant

## 2024-06-30 ENCOUNTER — Ambulatory Visit (HOSPITAL_COMMUNITY)
Admission: RE | Admit: 2024-06-30 | Discharge: 2024-06-30 | Disposition: A | Discharge status: Home / Self Care | Source: Ambulatory Visit | Attending: Oncology | Admitting: Oncology

## 2024-06-30 VITALS — BP 112/71 | HR 71 | Ht 73.0 in | Wt 133.0 lb

## 2024-06-30 DIAGNOSIS — I10 Essential (primary) hypertension: Secondary | ICD-10-CM | POA: Diagnosis not present

## 2024-06-30 DIAGNOSIS — R55 Syncope and collapse: Secondary | ICD-10-CM

## 2024-06-30 DIAGNOSIS — C760 Malignant neoplasm of head, face and neck: Secondary | ICD-10-CM | POA: Diagnosis not present

## 2024-06-30 DIAGNOSIS — J984 Other disorders of lung: Secondary | ICD-10-CM | POA: Diagnosis not present

## 2024-06-30 DIAGNOSIS — C099 Malignant neoplasm of tonsil, unspecified: Secondary | ICD-10-CM

## 2024-06-30 DIAGNOSIS — I2510 Atherosclerotic heart disease of native coronary artery without angina pectoris: Secondary | ICD-10-CM | POA: Insufficient documentation

## 2024-06-30 DIAGNOSIS — I5032 Chronic diastolic (congestive) heart failure: Secondary | ICD-10-CM | POA: Diagnosis present

## 2024-06-30 DIAGNOSIS — N1832 Chronic kidney disease, stage 3b: Secondary | ICD-10-CM

## 2024-06-30 DIAGNOSIS — R918 Other nonspecific abnormal finding of lung field: Secondary | ICD-10-CM | POA: Diagnosis not present

## 2024-06-30 LAB — POCT I-STAT CREATININE: Creatinine, Ser: 2 mg/dL — ABNORMAL HIGH (ref 0.61–1.24)

## 2024-06-30 MED ORDER — ISOSORBIDE MONONITRATE ER 30 MG PO TB24: 60.0000 mg | ORAL_TABLET | Freq: Every day | ORAL | Status: AC

## 2024-06-30 MED ORDER — IOHEXOL 300 MG/ML  SOLN
60.0000 mL | Freq: Once | INTRAMUSCULAR | Status: AC | PRN
  Administered 2024-06-30: 60 mL via INTRAVENOUS

## 2024-06-30 NOTE — Assessment & Plan Note (Signed)
 BP seems to be well controlled. He likely had hypotension that led to his fall. He is no longer on Hydralazine .  -Continue Amlodipine  10 mg once daily, Carvedilol  12.5 mg in the AM and 25 mg in PM -Decrease Imdur  to 60 mg once daily

## 2024-06-30 NOTE — Patient Instructions (Signed)
 Medication Instructions:  Your physician has recommended you make the following change in your medication:  DECREASE the Imdur  to 60 mg taking only in the mornings   *If you need a refill on your cardiac medications before your next appointment, please call your pharmacy*  Lab Work: None ordered  If you have labs (blood work) drawn today and your tests are completely normal, you will receive your results only by: MyChart Message (if you have MyChart) OR A paper copy in the mail If you have any lab test that is abnormal or we need to change your treatment, we will call you to review the results.  Testing/Procedures: Your physician has requested that you have an echocardiogram. Echocardiography is a painless test that uses sound waves to create images of your heart. It provides your doctor with information about the size and shape of your heart and how well your heart's chambers and valves are working. This procedure takes approximately one hour. There are no restrictions for this procedure. Please do NOT wear cologne, perfume, aftershave, or lotions (deodorant is allowed). Please arrive 15 minutes prior to your appointment time.  Please note: We ask at that you not bring children with you during ultrasound (echo/ vascular) testing. Due to room size and safety concerns, children are not allowed in the ultrasound rooms during exams. Our front office staff cannot provide observation of children in our lobby area while testing is being conducted. An adult accompanying a patient to their appointment will only be allowed in the ultrasound room at the discretion of the ultrasound technician under special circumstances. We apologize for any inconvenience.    ZIO XT- Long Term Monitor Instructions  Your physician has requested you wear a ZIO patch monitor for 14 days.  This is a single patch monitor. [employer] supplies one patch monitor per enrollment. Additional stickers are not available. Please do  not apply patch if you will be having a Nuclear Stress Test,  Echocardiogram, Cardiac CT, MRI, or Chest Xray during the period you would be wearing the  monitor. The patch cannot be worn during these tests. You cannot remove and re-apply the  ZIO XT patch monitor.  Your ZIO patch monitor will be mailed 3 day USPS to your address on file. It may take 3-5 days  to receive your monitor after you have been enrolled.  Once you have received your monitor, please review the enclosed instructions. Your monitor  has already been registered assigning a specific monitor serial # to you.  Billing and Patient Assistance Program Information  We have supplied [employer] with any of your insurance information on file for billing purposes. [employer] offers a sliding scale Patient Assistance Program for patients that do not have  insurance, or whose insurance does not completely cover the cost of the ZIO monitor.  You must apply for the Patient Assistance Program to qualify for this discounted rate.  To apply, please call [employer] at [phone], select option 4, select option 2, ask to apply for  Patient Assistance Program. Meredeth will ask your household income, and how many people  are in your household. They will quote your out-of-pocket cost based on that information.  [employer] will also be able to set up a 4-month, interest-free payment plan if needed.  Applying the monitor   Shave hair from upper left chest.  Hold abrader disc by orange tab. Rub abrader in 40 strokes over the upper left chest as  indicated in your monitor instructions.  Clean area with  4 enclosed alcohol pads. Let dry.  Apply patch as indicated in monitor instructions. Patch will be placed under collarbone on left  side of chest with arrow pointing upward.  Rub patch adhesive wings for 2 minutes. Remove white label marked 1. Remove the white  label marked 2. Rub patch adhesive wings for 2 additional minutes.  While looking in a  mirror, press and release button in center of patch. A small green light will  flash 3-4 times. This will be your only indicator that the monitor has been turned on.  Do not shower for the first 24 hours. You may shower after the first 24 hours.  Press the button if you feel a symptom. You will hear a small click. Record Date, Time and  Symptom in the Patient Logbook.  When you are ready to remove the patch, follow instructions on the last 2 pages of Patient  Logbook. Stick patch monitor onto the last page of Patient Logbook.  Place Patient Logbook in the blue and white box. Use locking tab on box and tape box closed  securely. The blue and white box has prepaid postage on it. Please place it in the mailbox as  soon as possible. Your physician should have your test results approximately 7 days after the  monitor has been mailed back to [facility].  Call [facility] Customer Care at [phone] if you have questions regarding  your ZIO XT patch monitor. Call them immediately if you see an orange light blinking on your  monitor.  If your monitor falls off in less than 4 days, contact our Monitor department at [phone].  If your monitor becomes loose or falls off after 4 days call [employer] at [phone] for  suggestions on securing your monitor   Follow-Up: At [facility], you and your health needs are our priority.  As part of our continuing mission to provide you with exceptional heart care, our providers are all part of one team.  This team includes your primary Cardiologist (physician) and Advanced Practice Providers or APPs (Physician Assistants and Nurse Practitioners) who all work together to provide you with the care you need, when you need it.  Your next appointment:   6 month(s)  Provider:   Deatrice Cage, MD    We recommend signing up for the patient portal called MyChart.  Sign up information is provided on this After Visit Summary.  MyChart is used  to connect with patients for Virtual Visits (Telemedicine).  Patients are able to view lab/test results, encounter notes, upcoming appointments, etc.  Non-urgent messages can be sent to your provider as well.   To learn more about what you can do with MyChart, go to [email or web address].   Other Instructions

## 2024-06-30 NOTE — Progress Notes (Unsigned)
 Enrolled patient for a 14 day Zio XT monitor to be mailed to patients home  Arida to read

## 2024-07-01 ENCOUNTER — Inpatient Hospital Stay

## 2024-07-01 ENCOUNTER — Telehealth: Payer: Self-pay | Admitting: Oncology

## 2024-07-01 ENCOUNTER — Inpatient Hospital Stay: Attending: Oncology | Admitting: Oncology

## 2024-07-01 ENCOUNTER — Other Ambulatory Visit: Payer: Self-pay

## 2024-07-01 DIAGNOSIS — I2510 Atherosclerotic heart disease of native coronary artery without angina pectoris: Secondary | ICD-10-CM | POA: Insufficient documentation

## 2024-07-01 DIAGNOSIS — C099 Malignant neoplasm of tonsil, unspecified: Secondary | ICD-10-CM | POA: Insufficient documentation

## 2024-07-01 DIAGNOSIS — I252 Old myocardial infarction: Secondary | ICD-10-CM | POA: Insufficient documentation

## 2024-07-01 DIAGNOSIS — N281 Cyst of kidney, acquired: Secondary | ICD-10-CM | POA: Insufficient documentation

## 2024-07-01 DIAGNOSIS — E785 Hyperlipidemia, unspecified: Secondary | ICD-10-CM | POA: Insufficient documentation

## 2024-07-01 DIAGNOSIS — I700 Atherosclerosis of aorta: Secondary | ICD-10-CM | POA: Insufficient documentation

## 2024-07-01 DIAGNOSIS — I130 Hypertensive heart and chronic kidney disease with heart failure and stage 1 through stage 4 chronic kidney disease, or unspecified chronic kidney disease: Secondary | ICD-10-CM | POA: Insufficient documentation

## 2024-07-01 DIAGNOSIS — Z79899 Other long term (current) drug therapy: Secondary | ICD-10-CM | POA: Insufficient documentation

## 2024-07-01 DIAGNOSIS — I739 Peripheral vascular disease, unspecified: Secondary | ICD-10-CM | POA: Insufficient documentation

## 2024-07-01 DIAGNOSIS — B1920 Unspecified viral hepatitis C without hepatic coma: Secondary | ICD-10-CM | POA: Insufficient documentation

## 2024-07-01 DIAGNOSIS — Z85818 Personal history of malignant neoplasm of other sites of lip, oral cavity, and pharynx: Secondary | ICD-10-CM | POA: Insufficient documentation

## 2024-07-01 DIAGNOSIS — Z7982 Long term (current) use of aspirin: Secondary | ICD-10-CM | POA: Insufficient documentation

## 2024-07-01 DIAGNOSIS — Z8619 Personal history of other infectious and parasitic diseases: Secondary | ICD-10-CM | POA: Insufficient documentation

## 2024-07-01 DIAGNOSIS — M7062 Trochanteric bursitis, left hip: Secondary | ICD-10-CM | POA: Diagnosis not present

## 2024-07-01 NOTE — Telephone Encounter (Signed)
 Needing to reschedule the patients appointments for today. Tried to call the patients sister, had to leave a voicemail to call back to schedule.

## 2024-07-02 ENCOUNTER — Telehealth: Payer: Self-pay

## 2024-07-02 ENCOUNTER — Telehealth: Payer: Self-pay | Admitting: Oncology

## 2024-07-02 NOTE — Telephone Encounter (Signed)
 Rescheduled patients appointments. Called and left the patients sister a voicemail with the appointment details.

## 2024-07-02 NOTE — Progress Notes (Incomplete)
 Head and Neck Cancer Location of Tumor / Histology:  Squamous Cell Carcinoma of Tonsil  Patient presented *** months ago with symptoms of: *** 06/11/2024 CT Soft Tissue Neck Wo Contrast  CT Chest W Contrast  Biopsies revealed:    Nutrition Status Yes No Comments  Weight changes? []  []    Swallowing concerns? []  []    PEG? []  []     Referrals Yes No Comments  Social Work? []  []    Dentistry? []  []    Swallowing therapy? []  []    Nutrition? []  []    Med/Onc? []  []     Safety Issues Yes No Comments  Prior radiation? []  []    Pacemaker/ICD? []  []    Possible current pregnancy? []  []    Is the patient on methotrexate? []  []     Tobacco/Marijuana/Snuff/ETOH use: {:18581}  Past/Anticipated interventions by otolaryngology, if any: {:18581}  Past/Anticipated interventions by medical oncology, if any: {:18581}     Current Complaints / other details:  ***

## 2024-07-02 NOTE — Telephone Encounter (Signed)
 Unable to reach patient's family member, [name]. Contacted the facility, [facility] to leave a message with Diane, the facility scheduler who coordinates patient's transportation to and from appointments. Shared the patient next appointments including RadOnc and Dr. Autumn along with lab appointments. Desk personnel agreed to pass this information along to Diane, the scheduler.

## 2024-07-03 ENCOUNTER — Encounter (HOSPITAL_COMMUNITY)

## 2024-07-06 ENCOUNTER — Telehealth: Payer: Self-pay

## 2024-07-06 NOTE — Transitions of Care (Post Inpatient/ED Visit) (Signed)
   07/06/2024  Name: Michael Chang MRN: [id number] DOB: 06/21/1946  Today's TOC FU Call Status: Today's TOC FU Call Status:: Unsuccessful Call (1st Attempt) Unsuccessful Call (1st Attempt) Date: 07/06/24  Attempted to reach the patient regarding the most recent Inpatient/ED visit.  Follow Up Plan: Additional outreach attempts will be made to reach the patient to complete the Transitions of Care (Post Inpatient/ED visit) call.   Signature  Avelina Essex, CMA (AAMA)  [facility]- [facility] Program [phone]

## 2024-07-10 ENCOUNTER — Ambulatory Visit
Admission: RE | Admit: 2024-07-10 | Discharge: 2024-07-10 | Disposition: A | Discharge status: Home / Self Care | Source: Ambulatory Visit | Attending: Radiation Oncology | Admitting: Radiation Oncology

## 2024-07-10 ENCOUNTER — Ambulatory Visit

## 2024-07-10 ENCOUNTER — Telehealth: Payer: Self-pay | Admitting: Radiation Oncology

## 2024-07-10 NOTE — Progress Notes (Incomplete)
 Radiation Oncology         [phone] ________________________________  Initial outpatient Consultation  Name: Michael Chang MRN: [id number]  Date: 07/10/2024  DOB: [birth year]  RR:Rojmx, Comer POUR, NP  Autumn Millman, MD   REFERRING PHYSICIAN: Autumn Millman, MD  DIAGNOSIS: No diagnosis found.   Cancer Staging  No matching staging information was found for the patient.  Squamous cell carcinoma of the right tonsil; p16 positive  CHIEF COMPLAINT: Here to discuss management of tonsillar cancer  HISTORY OF PRESENT ILLNESS::Michael Chang is a 78 y.o. male who was brought to the ED via [facility] on 06/11/24 after suffering a fall two days prior which resulted in a left hip fracture (he unfortunately spent 2 days in his home after the incident given that he was unable to stand and reach his telephone-his sister eventually found him).   Upon arrival to the ED, he further endorsed voice changes and difficulties swallowing which prompted a soft tissue neck CT that demonstrated: a soft tissue mass anchored along the right side of the oropharynx, measuring 3.9 x 3.3 x 3.8 cm, extending into the glossotonsillar sulcus, and asymmetric right submandibular soft tissue, likely reflecting enlarged nodes and worrisome for metastatic disease. A CT of the head and cervical spine was also performed which also partially demonstrated asymmetric soft tissue involving the right palatine tonsil and soft palate. Imaging otherwise showed no acute intracranial abnormalities or acute trauma to the cervical spine.   He was accordingly admitted and ENT was consulted for further management. Tissue sampling was recommended and he underwent biopsies of the right tonsil on 06/13/24. Pathology showed findings consistent with squamous cell carcinoma; p16 positive.   Pertaining to his hip fracture, ortho-surgery was consulted and he underwent a left hemiarthroplasty on 06/14/2024.   He was then discharged to an SNF on 06/16/24  [facility]).   Swallowing issues, if any: dysphagia   -- Barium swallow study on 06/15/24: oropharyngeal dysphagia with DIGEST score of 3; with incomplete velopharyngeal closure, base of tongue retraction, pharyngeal squeeze, and anterior hyoid excursion   Weight Changes: ***  Pain status: ***  Other symptoms: voice changes   Tobacco history, if any: none  Drug abuse history: remote history of heroin and cocaine use many years ago (has not used any drugs for at least 10 years)   ETOH abuse, if any: none   Prior cancers, if any: ***  PREVIOUS RADIATION THERAPY: No  PAST MEDICAL HISTORY:  has a past medical history of Anemia, Anemia in chronic kidney disease (03/02/2020), CAD (coronary artery disease), Cataract (lens) fragments in eye following cataract surgery, right eye (03/15/2020), Chronic combined systolic (congestive) and diastolic (congestive) heart failure (HCC), Chronic ulcer of right lower extremity (HCC), CKD (chronic kidney disease), stage III (HCC), Essential hypertension, HLD (hyperlipidemia), Near syncope (02/14/2020), NSTEMI (non-ST elevated myocardial infarction) (HCC), and PVD (peripheral vascular disease).    PAST SURGICAL HISTORY: Past Surgical History:  Procedure Laterality Date   COLONOSCOPY     x2   DIRECT LARYNGOSCOPY N/A 06/13/2024   Procedure: LARYNGOSCOPY, DIRECT;  Surgeon: Llewellyn Gerard LABOR, DO;  Location: [facility] OR;  Service: ENT;  Laterality: N/A;  Direct Laryngoscopy with Biopsy   HIP ARTHROPLASTY Left 06/13/2024   Procedure: HEMIARTHROPLASTY (BIPOLAR) HIP, POSTERIOR APPROACH FOR FRACTURE;  Surgeon: Beverley Evalene BIRCH, MD;  Location: [facility] OR;  Service: Orthopedics;  Laterality: Left;    FAMILY HISTORY: family history includes Dementia in his mother; Diabetes Mellitus II in his father.  SOCIAL HISTORY:  reports  that he has never smoked. He has never used smokeless tobacco. He reports that he does not currently use alcohol. He reports that he does not  currently use drugs after having used the following drugs: Heroin and Cocaine.  ALLERGIES: Patient has no known allergies.  MEDICATIONS:  Current Outpatient Medications  Medication Sig Dispense Refill   amLODipine  (NORVASC ) 10 MG tablet Take 10 mg by mouth daily.     aspirin  EC 81 MG tablet Take 1 tablet (81 mg total) by mouth 2 (two) times daily. To prevent blood clots for 30 days after surgery. 60 tablet 0   carvedilol  (COREG ) 25 MG tablet Take 12.5-25 mg by mouth 2 (two) times daily with a meal. Take 12.5mg  (one-half tablet) in the morning and 25mg  (one tablet) in the evening for a total daily dose of 37.5mg .     enoxaparin  (LOVENOX ) 30 MG/0.3ML injection Inject 0.3 mLs (30 mg total) into the skin daily.     ferrous sulfate  325 (65 FE) MG tablet Take 325 mg by mouth daily with breakfast.     HYDROcodone -acetaminophen  (NORCO/VICODIN) 5-325 MG tablet Take 1 tablet by mouth every 6 (six) hours as needed for severe pain (pain score 7-10). in hip after surgery 20 tablet 0   isosorbide  mononitrate (IMDUR ) 30 MG 24 hr tablet Take 2 tablets (60 mg total) by mouth daily.     Multiple Vitamins-Minerals (MULTIVITAMIN WITH MINERALS) tablet Take 1 tablet by mouth daily.     mupirocin  ointment (BACTROBAN ) 2 % Place 1 Application into the nose 2 (two) times daily for 60 doses. Use as directed 2 times daily for 5 days every other week for 6 weeks. 60 g 0   No current facility-administered medications for this encounter.    REVIEW OF SYSTEMS:  Notable for that above.   PHYSICAL EXAM:  vitals were not taken for this visit.   General: Alert and oriented, in no acute distress HEENT: Head is normocephalic. Extraocular movements are intact. Oropharynx is notable for ***. Neck: Neck is notable for *** Heart: Regular in rate and rhythm with no murmurs, rubs, or gallops. Chest: Clear to auscultation bilaterally, with no rhonchi, wheezes, or rales. Abdomen: Soft, nontender, nondistended, with no rigidity or  guarding. Extremities: No cyanosis or edema. Lymphatics: see Neck Exam Skin: No concerning lesions. Musculoskeletal: symmetric strength and muscle tone throughout. Neurologic: Cranial nerves II through XII are grossly intact. No obvious focalities. Speech is fluent. Coordination is intact. Psychiatric: Judgment and insight are intact. Affect is appropriate.   ECOG = ***  0 - Asymptomatic (Fully active, able to carry on all predisease activities without restriction)  1 - Symptomatic but completely ambulatory (Restricted in physically strenuous activity but ambulatory and able to carry out work of a light or sedentary nature. For example, light housework, office work)  2 - Symptomatic, <50% in bed during the day (Ambulatory and capable of all self care but unable to carry out any work activities. Up and about more than 50% of waking hours)  3 - Symptomatic, >50% in bed, but not bedbound (Capable of only limited self-care, confined to bed or chair 50% or more of waking hours)  4 - Bedbound (Completely disabled. Cannot carry on any self-care. Totally confined to bed or chair)  5 - Death   Raylene MM, Creech RH, Tormey DC, et al. [phone]). Toxicity and response criteria of the [facility]. Am. DOROTHA Bridges. Oncol. 5 (6): 649-55   LABORATORY DATA:  Lab Results  Component  Value Date   WBC 7.8 06/16/2024   HGB 8.5 (L) 06/16/2024   HCT 26.7 (L) 06/16/2024   MCV 89.0 06/16/2024   PLT 256 06/16/2024   CMP     Component Value Date/Time   NA 141 06/16/2024 0436   K 3.4 (L) 06/16/2024 0436   CL 106 06/16/2024 0436   CO2 26 06/16/2024 0436   GLUCOSE 113 (H) 06/16/2024 0436   BUN 47 (H) 06/16/2024 0436   CREATININE 2.00 (H) 06/30/2024 1039   CALCIUM 7.8 (L) 06/16/2024 0436   PROT 6.5 06/13/2024 1254   ALBUMIN 2.5 (L) 06/13/2024 1254   AST 42 (H) 06/13/2024 1254   ALT 25 06/13/2024 1254   ALKPHOS 48 06/13/2024 1254   BILITOT 0.7 06/13/2024 1254   GFRNONAA 30 (L)  06/16/2024 0436   GFRAA 36 (L) 02/15/2020 0516      Lab Results  Component Value Date   TSH 3.41 03/05/2024     RADIOGRAPHY: CT Chest W Contrast Result Date: 06/30/2024 CLINICAL DATA:  Head and neck cancer staging. Squamous cell carcinoma of tonsil. * Tracking Code: BO * EXAM: CT CHEST WITH CONTRAST TECHNIQUE: Multidetector CT imaging of the chest was performed during intravenous contrast administration. RADIATION DOSE REDUCTION: This exam was performed according to the departmental dose-optimization program which includes automated exposure control, adjustment of the mA and/or kV according to patient size and/or use of iterative reconstruction technique. CONTRAST:  60mL OMNIPAQUE IOHEXOL 300 MG/ML  SOLN COMPARISON:  CT neck 06/11/2024. Renal ultrasound 03/08/2020. Chest radiographs 02/14/2020. FINDINGS: Cardiovascular: No acute vascular findings are demonstrated. Extensive coronary artery atherosclerosis with lesser involvement of the aorta and great vessels. The heart size is normal. There is no pericardial effusion. Mediastinum/Nodes: There are no enlarged mediastinal, hilar or axillary lymph nodes.Mild distal esophageal wall thickening. The trachea and thyroid  gland appear unremarkable. Lungs/Pleura: No pleural effusion or pneumothorax. Focal airspace disease in the left lower lobe with associated air bronchograms, most consistent with pneumonia. There are scattered small pulmonary nodules, measuring up to 5 mm anteriorly in the right lower lobe on image 127/7. Upper abdomen: No acute findings are identified in the visualized upper abdomen. There are scattered low-density hepatic lesions which are likely cysts. Heterogeneous renal enhancement scattered cysts. There is a 1.9 cm lesion posteriorly in the upper pole of the left kidney which has a density of 56 HU, indeterminate, without clear corresponding cyst on previous ultrasound. Musculoskeletal/Chest wall: There is no chest wall mass or suspicious  osseous finding. Mild spondylosis. Moderate bilateral gynecomastia. IMPRESSION: 1. No evidence of metastatic disease in the chest. 2. Left lower lobe airspace disease with associated air bronchograms, most consistent with pneumonia. Radiographic follow-up recommended to document clearing and exclude atypical primary thoracic malignancy. 3. Scattered small pulmonary nodules bilaterally, likely benign. Fleischner criteria do not apply. Recommend attention on follow-up. 4. Indeterminate 1.9 cm lesion posteriorly in the upper pole of the left kidney, possibly a complex cyst. Recommend further evaluation with dedicated renal CT or MRI (without and with contrast). 5.  Aortic Atherosclerosis (ICD10-I70.0). Electronically Signed   By: Elsie Perone M.D.   On: 06/30/2024 12:28   DG Swallowing Func-Speech Pathology Result Date: 06/15/2024 Table formatting from the original result was not included. Modified Barium Swallow Study Patient Details Name: Michael Chang MRN: [id number] Date of Birth: 1946/02/02 Today's Date: 06/15/2024 HPI/PMH: HPI: 78 y.o. male admitted 06/11/24 after fall out of his recliner chair at home, sustaining a mildly displaced subcapital fracture of the  left femoral head  and neck junction. CT of the neck, there was a soft tissue mass along the right side of oropharynx, concerning for oropharyngeal carcinoma. Pt underwent laryngoscopy, direct with biopsy 9/13. Pt s/p left bipolar hemiarthroplasty with posterior approach 9/13. PMHx: essential HTN, chronic diastolic dysfunction, hepatitis C, with a remote history of heroin use many years ago, CKD IIIb, and CAD. Clinical Impression: Clinical Impression: Pt has an oropharyngeal dysphagia with DIGEST score of 3. He has more mild oral symptoms that include weak lingual propulsion and a small collection of oral residue, which he mostly clears spontaneously. Pharyngeally he has a more severe dysphagia, with incomplete velopharyngeal closure, base of tongue  retraction, pharyngeal squeeze, and anterior hyoid excursion. This contributes to reduced UES opening and minimal epiglottic deflection, resulting in significant pharyngeal residue. This is diffuse but primarily in the valleculae, and pt does not have awareness of its presence. Multiple swallows help to reduce some of the residue, but it never fully clears. A nectar thick liquids wash also helps to clear some of the residue from purees. Postural strategies, including a chin tuck or head turns, do not impact function. Pt also has reduced laryngeal elevation and laryngeal vestibule closure, and as he is trying to swallow or clear his residue, intermittent aspiration occurs with thin liquids (PAS 7). Penetration is transient with other consistencies (PAS 2), but becomes more frank when trying to use a nectar thick liquid wash after more solid boluses (PAS 3). Recommend that he start with Dys 1 (puree) diet and nectar thick liquids with strategies including alternating bites/sips and using multiple, effortful swallows. Pt is likely to need reinforcement of strategies before considering advancement, although note that if he is to begin tx for his mass, this may further exacerbate the dysphagia that is already present. SLP f/u is indicated acutely and at next level of care. Factors that may increase risk of adverse event in presence of aspiration Noe & Lianne 2021): Factors that may increase risk of adverse event in presence of aspiration Noe & Lianne 2021): Poor general health and/or compromised immunity; Frail or deconditioned; Limited mobility; Inadequate oral hygiene; Weak cough Recommendations/Plan: Swallowing Evaluation Recommendations Swallowing Evaluation Recommendations Recommendations: PO diet PO Diet Recommendation: Dysphagia 1 (Pureed); Mildly thick liquids (Level 2, nectar thick) Liquid Administration via: Cup; No straw Medication Administration: Crushed with puree Supervision: Staff to assist with  self-feeding; Full supervision/cueing for swallowing strategies Swallowing strategies  : Slow rate; Small bites/sips; effortful swallow; Multiple dry swallows after each bite/sip; Follow solids with liquids Postural changes: Position pt fully upright for meals; Stay upright 30-60 min after meals Oral care recommendations: Oral care BID (2x/day) Caregiver Recommendations: Avoid jello, ice cream, thin soups, popsicles; Remove water pitcher; Have oral suction available Treatment Plan Treatment Plan Treatment recommendations: Therapy as outlined in treatment plan below Follow-up recommendations: Skilled nursing-short term rehab (<3 hours/day) Functional status assessment: Patient has had a recent decline in their functional status and demonstrates the ability to make significant improvements in function in a reasonable and predictable amount of time. Treatment frequency: Min 2x/week Treatment duration: 2 weeks Interventions: Aspiration precaution training; Compensatory techniques; Patient/family education; Trials of upgraded texture/liquids; Oropharyngeal exercises; Diet toleration management by SLP Recommendations Recommendations for follow up therapy are one component of a multi-disciplinary discharge planning process, led by the attending physician.  Recommendations may be updated based on patient status, additional functional criteria and insurance authorization. Assessment: Orofacial Exam: Orofacial Exam Oral Cavity - Dentition: Missing dentition; Poor condition Orofacial Anatomy: Other (comment) (oropharyngeal  mass) Anatomy: Anatomy: Suspected cervical osteophytes; Other (Comment) (oropharyngeal mass per CT Neck and ENT findings) Boluses Administered: Boluses Administered Boluses Administered: Thin liquids (Level 0); Mildly thick liquids (Level 2, nectar thick); Moderately thick liquids (Level 3, honey thick); Puree; Solid  Oral Impairment Domain: Oral Impairment Domain Lip Closure: No labial escape Tongue control  during bolus hold: Cohesive bolus between tongue to palatal seal Bolus preparation/mastication: Slow prolonged chewing/mashing with complete recollection Bolus transport/lingual motion: Slow tongue motion Oral residue: Residue collection on oral structures Location of oral residue : Tongue; Palate Initiation of pharyngeal swallow : Valleculae  Pharyngeal Impairment Domain: Pharyngeal Impairment Domain Soft palate elevation: Trace column of contrast or air between SP and PW Laryngeal elevation: Partial superior movement of thyroid  cartilage/partial approximation of arytenoids to epiglottic petiole Anterior hyoid excursion: Partial anterior movement Epiglottic movement: Partial inversion Laryngeal vestibule closure: Incomplete, narrow column air/contrast in laryngeal vestibule Pharyngeal stripping wave : Present - diminished Pharyngeal contraction (A/P view only): N/A Pharyngoesophageal segment opening: Partial distention/partial duration, partial obstruction of flow Tongue base retraction: Wide column of contrast or air between tongue base and PPW Pharyngeal residue: Minimal to no pharyngeal clearance Location of pharyngeal residue: Pharyngeal wall; Pyriform sinuses; Tongue base; Valleculae; Diffuse (>3 areas)  Esophageal Impairment Domain: Esophageal Impairment Domain Esophageal clearance upright position: Complete clearance, esophageal coating Pill: Pill Consistency administered: -- (deferred) Penetration/Aspiration Scale Score: Penetration/Aspiration Scale Score 2.  Material enters airway, remains ABOVE vocal cords then ejected out: Moderately thick liquids (Level 3, honey thick); Puree 3.  Material enters airway, remains ABOVE vocal cords and not ejected out: Mildly thick liquids (Level 2, nectar thick) 7.  Material enters airway, passes BELOW cords and not ejected out despite cough attempt by patient: Thin liquids (Level 0) Compensatory Strategies: Compensatory Strategies Compensatory strategies: Yes Effortful  swallow: Effective Effective Effortful Swallow: Mildly thick liquid (Level 2, nectar thick); Puree Multiple swallows: Effective Effective Multiple Swallows: Mildly thick liquid (Level 2, nectar thick); Puree Chin tuck: Ineffective Ineffective Chin Tuck: Puree Liquid wash: Effective Effective Liquid Wash: Mildly thick liquid (Level 2, nectar thick); Puree Left head turn: Ineffective Ineffective Left Head Turn: Puree Right head turn: Ineffective Ineffective Right Head Turn: Puree   General Information: Caregiver present: No  Diet Prior to this Study: Regular; Thin liquids (Level 0)   Temperature : Normal   Respiratory Status: WFL   Supplemental O2: None (Room air)   History of Recent Intubation: Yes  Behavior/Cognition: Alert; Cooperative; Pleasant mood; Other (Comment) (hard of hearing) Self-Feeding Abilities: Able to self-feed Baseline vocal quality/speech: Dysphonic Volitional Cough: Able to elicit Volitional Swallow: Able to elicit Exam Limitations: No limitations Goal Planning: Prognosis for improved oropharyngeal function: Good Barriers to Reach Goals: Overall medical prognosis No data recorded Patient/Family Stated Goal: To eat applesauce Consulted and agree with results and recommendations: Patient Pain: Pain Assessment Pain Assessment: Faces Pain Score: 7 Faces Pain Scale: 0 Pain Location: L hip Pain Descriptors / Indicators: Operative site guarding; Grimacing; Discomfort; Aching End of Session: Start Time:SLP Start Time (ACUTE ONLY): 1017 Stop Time: SLP Stop Time (ACUTE ONLY): 1036 Time Calculation:SLP Time Calculation (min) (ACUTE ONLY): 19 min Charges: SLP Evaluations $ SLP Speech Visit: 1 Visit SLP Evaluations $BSS Swallow: 1 Procedure $MBS Swallow: 1 Procedure SLP visit diagnosis: SLP Visit Diagnosis: Dysphagia, oropharyngeal phase (R13.12) Past Medical History: Past Medical History: Diagnosis Date  Anemia   Anemia in chronic kidney disease 03/02/2020  CAD (coronary artery disease)   Cataract (lens)  fragments in eye following cataract  surgery, right eye 03/15/2020  Chronic combined systolic (congestive) and diastolic (congestive) heart failure (HCC)   Chronic ulcer of right lower extremity (HCC)   CKD (chronic kidney disease), stage III (HCC)   Essential hypertension   HLD (hyperlipidemia)   Near syncope 02/14/2020  NSTEMI (non-ST elevated myocardial infarction) (HCC)   PVD (peripheral vascular disease) (HCC)  Past Surgical History: Past Surgical History: Procedure Laterality Date  COLONOSCOPY    x2 Leita SAILOR., M.A. CCC-SLP Acute Rehabilitation Services Office: [phone] Secure chat preferred 06/15/2024, 11:25 AM  CT Soft Tissue Neck Wo Contrast Result Date: 06/11/2024 EXAM: CT NECK WITHOUT CONTRAST 06/11/2024 03:09:19 PM TECHNIQUE: CT of the neck was performed without the administration of intravenous contrast. Multiplanar reformatted images are provided for review. Automated exposure control, iterative reconstruction, and/or weight based adjustment of the mA/kV was utilized to reduce the radiation dose to as low as reasonably achievable. COMPARISON: None available. CLINICAL HISTORY: Soft tissue mass of right tonsil. FINDINGS: AERODIGESTIVE TRACT: Soft tissue mass anchored along the right side of the oropharynx measures 3.9 x 3.3 x 3.8 cm. Lesion extends into the glosso tonsillar sulcus. The left palatine tonsil was unremarkable. SALIVARY GLANDS: The left submandibular gland is unremarkable. Asymmetric right submandibular soft tissue likely reflects enlarged nodes. THYROID : Unremarkable. LYMPH NODES: No significant lower adenopathy is present. SOFT TISSUES: No mass or fluid collection. BRAIN, ORBITS, SINUSES AND MASTOIDS: No acute abnormality. LUNGS AND MEDIASTINUM: No acute abnormality. BONES: No focal bone abnormality. IMPRESSION: 1. Soft tissue mass anchored along the right side of the oropharynx, measuring 3.9 x 3.3 x 3.8 cm, extending into the glossotonsillar sulcus. This most likely represents a  squamous cell carcinoma 2. Asymmetric right submandibular soft tissue, likely reflecting enlarged nodes and worrisome for metastatic disease . Electronically signed by: Lonni Necessary MD 06/11/2024 03:25 PM EDT RP Workstation: [id number]   DG Hip Unilat With Pelvis 2-3 Views Left Result Date: 06/11/2024 CLINICAL DATA:  Pain after fall 2 days ago. EXAM: DG HIP (WITH OR WITHOUT PELVIS) 2-3V LEFT COMPARISON:  None Available. FINDINGS: Acute impacted and mildly displaced subcapital fracture of the left femoral head and neck junction. No dislocation. Moderate to advanced arthritic changes of the right hip with advanced joint space narrowing. Sacroiliac joints and pubic symphysis appear anatomically aligned. IMPRESSION: 1. Acute impacted and mildly displaced subcapital fracture of the left femoral head and neck junction. 2. Moderate to advanced arthritic changes of the right hip. Electronically Signed   By: Harrietta Sherry M.D.   On: 06/11/2024 14:26   DG Knee 1-2 Views Left Result Date: 06/11/2024 CLINICAL DATA:  Pain after fall two days ago. EXAM: LEFT KNEE - 1-2 VIEW COMPARISON:  None Available. FINDINGS: No evidence of acute fracture, dislocation, or joint effusion. Moderate medial femorotibial compartment predominant osteoarthritis. Patellar enthesopathy. Soft tissues are unremarkable. IMPRESSION: 1. No acute osseous abnormality. 2. Moderate medial femorotibial compartment predominant osteoarthritis. Electronically Signed   By: Harrietta Sherry M.D.   On: 06/11/2024 14:22   CT Head Wo Contrast Result Date: 06/11/2024 EXAM: CT HEAD WITHOUT CONTRAST 06/11/2024 01:45:49 PM TECHNIQUE: CT of the head was performed without the administration of intravenous contrast. Automated exposure control, iterative reconstruction, and/or weight based adjustment of the mA/kV was utilized to reduce the radiation dose to as low as reasonably achievable. COMPARISON: CT head 02/14/2020 CLINICAL HISTORY: Polytrauma, blunt. Pt  BIB EMS with CC of left hip pain s/p GLF after possible near syncopal episode 2 days ago. Pt reported he has been unable to get  off the floor and spent two days crawling on floor before his sister found him today. AOx4 FINDINGS: BRAIN AND VENTRICLES: Nonspecific hypoattenuation in the periventricular and subcortical white matter, most likely representing chronic microvascular ischemic changes. Remote lacunar infarcts in the left basal ganglia and in the bilateral corona radiata. Encephalomalacia in the medial left occipital lobe suggestive of remote infarct. Additional small remote infarcts in the posterior left cerebellum. Mild parenchymal volume loss. No acute hemorrhage. No evidence of acute infarct. No hydrocephalus. No extra-axial collection. No mass effect or midline shift. ORBITS: Bilateral lens replacement. Elongation of the globes, particularly on the left, suggestive of axial myopia. No acute abnormality. SINUSES: Mucosal thickening in the frontal sinuses and ethmoid sinuses bilaterally. Additional mucosal thickening and possible mucous retention cysts in the inferior aspect of the maxillary sinuses. No acute abnormality. SOFT TISSUES AND SKULL: There is possible asymmetric soft tissue involving the right palatine tonsil and soft palate appreciated on series 2 image 2. No acute soft tissue abnormality. No skull fracture. IMPRESSION: 1. No acute intracranial abnormality. 2. Mild-to-moderate chronic microvascular ischemic changes. Mild parenchymal volume loss. 3. Multiple remote infarcts as above. 4. Asymmetric soft tissue involving the right palatine tonsil and soft palate, partially visualized. Recommend correlation with direct visualization and consider CT neck soft tissue with contrast for further evaluation. Electronically signed by: Donnice Mania MD 06/11/2024 02:14 PM EDT RP Workstation: [id number]   CT Cervical Spine Wo Contrast Result Date: 06/11/2024 CLINICAL DATA:  Polytrauma, blunt Near  syncopal episode 2 days ago.  Found down. EXAM: CT CERVICAL SPINE WITHOUT CONTRAST TECHNIQUE: Multidetector CT imaging of the cervical spine was performed without intravenous contrast. Multiplanar CT image reconstructions were also generated. RADIATION DOSE REDUCTION: This exam was performed according to the departmental dose-optimization program which includes automated exposure control, adjustment of the mA and/or kV according to patient size and/or use of iterative reconstruction technique. COMPARISON:  None Available. FINDINGS: Alignment: Normal. Skull base and vertebrae: No evidence of acute cervical spine fracture or traumatic subluxation. Soft tissues and spinal canal: No prevertebral fluid or swelling. No visible canal hematoma. Disc levels: Multilevel relatively mild spondylosis with disc space narrowing and uncinate spurring from C3-4 through C6-7. No evidence of large disc herniation or high-grade spinal stenosis. No more than mild foraminal narrowing identified. Upper chest: Clear lung apices. Other: Probable small mucous retention cysts in the maxillary sinuses bilaterally. Prominent mylohyoid ossification, left greater than right. IMPRESSION: 1. No evidence of acute cervical spine fracture, traumatic subluxation or static signs of instability. 2. Mild cervical spondylosis. Electronically Signed   By: Elsie Perone M.D.   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 I *** recommend radiotherapy for this patient.  We discussed the potential risks, benefits, and side effects of radiotherapy. We talked in detail about acute and late effects. We discussed that some of the most bothersome acute effects may be mucositis, dysgeusia, salivary changes, skin irritation, hair loss, dehydration, weight loss and fatigue. We talked about late effects which include but are not necessarily limited to dysphagia, hypothyroidism, nerve injury, vascular  injury, spinal cord injury, xerostomia, trismus, neck edema, dental issues, non-healing wound, and potentially fatal injury to any of the tissues in the head and neck region. No guarantees of treatment were given. A consent form was signed and placed in the patient's medical record. The patient is enthusiastic about proceeding with treatment. I look forward to participating in the  patient's care.    Simulation (treatment planning) will take place ***  We also discussed that the treatment of head and neck cancer is a multidisciplinary process to maximize treatment outcomes and quality of life. For this reason the following referrals have been or will be made:  *** Medical oncology to discuss chemotherapy   *** Dentistry for dental evaluation, possible extractions in the radiation fields, and /or advice on reducing risk of cavities, osteoradionecrosis, or other oral issues.  *** Nutritionist for nutrition support during and after treatment.  *** Speech language pathology for swallowing and/or speech therapy.  *** Social work for social support.   *** Physical therapy due to risk of lymphedema in neck and deconditioning.  *** Baseline labs including TSH.  On date of service, in total, I spent *** minutes on this encounter. Patient was seen in person.  __________________________________________   Lauraine Golden, MD  This document serves as a record of services personally performed by Lauraine Golden, MD. It was created on her behalf by Dorthy Fuse, a trained medical scribe. The creation of this record is based on the scribe's personal observations and the provider's statements to them. This document has been checked and approved by the attending provider.

## 2024-07-10 NOTE — Telephone Encounter (Signed)
 10/10 Received secure chat from nursing patient has decline consult/radiation tx at this time.  Called and spoke Dr. Clovis nurse [place]; so they are aware. Cancel appts at this time as requested.

## 2024-07-10 NOTE — Progress Notes (Signed)
 Oncology Nurse Navigator Documentation   I received a message that Mr. Iacovelli was not aware/or cancelled his radiation consult today when nursing called to remind him of his scheduled appointment. I called Mr. Kos and spoke with him directly. He told me that he will not agree to radiation treatment for his cancer because he has heard bad things from people that he knows. I explained that I do understand that radiation has side effects but it was the best treatment for his type of head and neck cancer. I told him that if he didn't receive radiation it was likely that the tumor in his throat would continue to grow and may eventually end his life. He voiced his understanding and told me that he is 78 years old and will not live forever.  I then asked if he wanted to come to his medical oncology appointment with Dr. Autumn on 10/17 and he seemed interested. He is aware of that appointment and wrote down my direct number to call me after he considers his what we talked about. I will follow up next week with Mr. Cousineau if he does not call me by that time. I have notified Dr. Izell and Dr. Remus Nest Damari Hiltz RN, BSN, OCN Head & Neck Oncology Nurse Navigator [facility] Phone # [phone]  Fax # [phone]

## 2024-07-17 ENCOUNTER — Inpatient Hospital Stay: Admitting: Oncology

## 2024-07-17 ENCOUNTER — Other Ambulatory Visit: Payer: Self-pay

## 2024-07-17 ENCOUNTER — Inpatient Hospital Stay

## 2024-07-17 VITALS — BP 117/75 | HR 65 | Temp 97.9°F | Resp 16 | Ht 73.0 in | Wt 129.5 lb

## 2024-07-17 DIAGNOSIS — C099 Malignant neoplasm of tonsil, unspecified: Secondary | ICD-10-CM

## 2024-07-17 DIAGNOSIS — I739 Peripheral vascular disease, unspecified: Secondary | ICD-10-CM | POA: Diagnosis not present

## 2024-07-17 DIAGNOSIS — I2510 Atherosclerotic heart disease of native coronary artery without angina pectoris: Secondary | ICD-10-CM | POA: Diagnosis not present

## 2024-07-17 DIAGNOSIS — I130 Hypertensive heart and chronic kidney disease with heart failure and stage 1 through stage 4 chronic kidney disease, or unspecified chronic kidney disease: Secondary | ICD-10-CM | POA: Diagnosis not present

## 2024-07-17 DIAGNOSIS — I252 Old myocardial infarction: Secondary | ICD-10-CM | POA: Diagnosis not present

## 2024-07-17 DIAGNOSIS — Z85818 Personal history of malignant neoplasm of other sites of lip, oral cavity, and pharynx: Secondary | ICD-10-CM | POA: Diagnosis not present

## 2024-07-17 DIAGNOSIS — N289 Disorder of kidney and ureter, unspecified: Secondary | ICD-10-CM | POA: Diagnosis not present

## 2024-07-17 DIAGNOSIS — E785 Hyperlipidemia, unspecified: Secondary | ICD-10-CM | POA: Diagnosis not present

## 2024-07-17 DIAGNOSIS — N281 Cyst of kidney, acquired: Secondary | ICD-10-CM | POA: Diagnosis not present

## 2024-07-17 DIAGNOSIS — B1920 Unspecified viral hepatitis C without hepatic coma: Secondary | ICD-10-CM | POA: Diagnosis not present

## 2024-07-17 DIAGNOSIS — Z7982 Long term (current) use of aspirin: Secondary | ICD-10-CM | POA: Diagnosis not present

## 2024-07-17 DIAGNOSIS — Z79899 Other long term (current) drug therapy: Secondary | ICD-10-CM | POA: Diagnosis not present

## 2024-07-17 DIAGNOSIS — I700 Atherosclerosis of aorta: Secondary | ICD-10-CM | POA: Diagnosis not present

## 2024-07-17 DIAGNOSIS — Z8619 Personal history of other infectious and parasitic diseases: Secondary | ICD-10-CM | POA: Diagnosis not present

## 2024-07-17 LAB — CBC WITH DIFFERENTIAL (CANCER CENTER ONLY)
Abs Immature Granulocytes: 0.01 K/uL (ref 0.00–0.07)
Basophils Absolute: 0 K/uL (ref 0.0–0.1)
Basophils Relative: 0 %
Eosinophils Absolute: 0.1 K/uL (ref 0.0–0.5)
Eosinophils Relative: 3 %
HCT: 26.2 % — ABNORMAL LOW (ref 39.0–52.0)
Hemoglobin: 8.2 g/dL — ABNORMAL LOW (ref 13.0–17.0)
Immature Granulocytes: 0 %
Lymphocytes Relative: 34 %
Lymphs Abs: 1.6 K/uL (ref 0.7–4.0)
MCH: 27.1 pg (ref 26.0–34.0)
MCHC: 31.3 g/dL (ref 30.0–36.0)
MCV: 86.5 fL (ref 80.0–100.0)
Monocytes Absolute: 0.5 K/uL (ref 0.1–1.0)
Monocytes Relative: 11 %
Neutro Abs: 2.4 K/uL (ref 1.7–7.7)
Neutrophils Relative %: 52 %
Platelet Count: 417 K/uL — ABNORMAL HIGH (ref 150–400)
RBC: 3.03 MIL/uL — ABNORMAL LOW (ref 4.22–5.81)
RDW: 14.6 % (ref 11.5–15.5)
WBC Count: 4.8 K/uL (ref 4.0–10.5)
nRBC: 0 % (ref 0.0–0.2)

## 2024-07-17 LAB — CMP (CANCER CENTER ONLY)
ALT: 5 U/L (ref 0–44)
AST: 11 U/L — ABNORMAL LOW (ref 15–41)
Albumin: 3 g/dL — ABNORMAL LOW (ref 3.5–5.0)
Alkaline Phosphatase: 56 U/L (ref 38–126)
Anion gap: 5 (ref 5–15)
BUN: 20 mg/dL (ref 8–23)
CO2: 29 mmol/L (ref 22–32)
Calcium: 9 mg/dL (ref 8.9–10.3)
Chloride: 106 mmol/L (ref 98–111)
Creatinine: 1.9 mg/dL — ABNORMAL HIGH (ref 0.61–1.24)
GFR, Estimated: 36 mL/min — ABNORMAL LOW (ref >60)
Glucose, Bld: 97 mg/dL (ref 70–99)
Potassium: 4.3 mmol/L (ref 3.5–5.1)
Sodium: 140 mmol/L (ref 135–145)
Total Bilirubin: 0.2 mg/dL (ref 0.0–1.2)
Total Protein: 7.9 g/dL (ref 6.5–8.1)

## 2024-07-17 LAB — TSH: TSH: 3.99 u[IU]/mL (ref 0.350–4.500)

## 2024-07-17 NOTE — Progress Notes (Unsigned)
 [facility]  ONCOLOGY CONSULT NOTE   PATIENT NAME: Michael Chang   MR#: [id number] DOB: 06-11-1946  DATE OF SERVICE: 07/17/2024   REFERRING PROVIDER  Hospital follow-up.  Patient Care Team: Gretta Comer POUR, NP as PCP - General (Internal Medicine) Darron Deatrice LABOR, MD as PCP - Cardiology (Cardiology) Fate Morna SAILOR, [facility] (Inactive) as Pharmacist (Pharmacist) Malmfelt, Delon CROME, RN as Oncology Nurse Navigator Autumn Millman, MD as Consulting Physician (Oncology) Izell Domino, MD as Consulting Physician (Radiation Oncology) Carlie Clark, MD as Consulting Physician (Otolaryngology) Skotnicki, Gerard LABOR, DO as Consulting Physician (Otolaryngology)    CHIEF COMPLAINT/ PURPOSE OF CONSULTATION:   Recently diagnosed right-sided oropharyngeal carcinoma.  ASSESSMENT & PLAN:   Michael Chang is a 78 y.o. gentleman with a past medical history of hypertension, hyperlipidemia, hepatitis C, CKD, anemia, CAD , was referred to our clinic after recent hospitalization, when he was diagnosed with right-sided oropharyngeal carcinoma.  Right tonsillar squamous cell carcinoma (HCC) Please review oncology history for additional details and timeline of events.  Confirmed right tonsillar squamous cell carcinoma with possible cervical lymph node involvement. CT scan of the chest showed no evidence of metastasis. He declined PET scan due to concerns about radioactive contrast.  Discussed diagnosis, tentative staging, prognosis, plan of care and treatment options.  Reviewed NCCN guidelines.  Recommended concurrent chemoradiation with docetaxel or carboplatin/paclitaxel weekly during the course of radiation.  He is not interested in chemotherapy or radiation therapy at this time.   Surgical option discussed involves removal of the tonsils and lymph nodes, but no guarantee of complete resection.   He was advised to consider radiation therapy alone at least to avoid  progression of disease, but he is hesitant. Risks of untreated cancer include progression to other organs, difficulty swallowing, weight loss, and potential breathing issues. His sister is involved in decision-making process.  - Refer to Dr. Izell, radiation oncologist, for discussion of radiation therapy options  - Discuss dental evaluation and potential extractions with Dr. Danial prior to radiation therapy  We will see him in clinic for follow-up depending on when and if he pursues radiation therapies.  Poor dentition requiring dental evaluation prior to radiation therapy Poor dentition with potential need for extractions prior to radiation therapy. Oral hygiene is critical before starting radiation to prevent complications. - Refer to Dr. Danial for dental evaluation and extractions if necessary prior to radiation therapy  Complex cyst of left kidney, possible renal neoplasm CT scan revealed a complex cyst in the left kidney, raising concern for possible renal neoplasm. Due to chronic kidney disease, contrast cannot be used for further imaging. MRI is needed to better characterize the cyst. Complex cysts can be benign or malignant, and further imaging is required to determine the nature of the cyst. - Order MRI of the abdomen to evaluate the complex cyst in the left kidney - Plan for follow-up with urology for further management based on MRI results  Chronic kidney disease Chronic kidney disease with well-managed creatinine levels over the past four years. Current creatinine level is elevated, precluding the use of contrast for imaging studies. Kidney function has not significantly declined recently.  I reviewed lab results and outside records for this visit and discussed relevant results with the patient. Diagnosis, plan of care and treatment options were also discussed in detail with the patient. Opportunity provided to ask questions and answers provided to his apparent satisfaction.  Provided instructions to call our clinic with any problems, questions or concerns prior  to return visit. I recommended to continue follow-up with PCP and sub-specialists. He verbalized understanding and agreed with the plan. No barriers to learning was detected.  NCCN guidelines have been consulted in the planning of this patient's care.  Maelle Sheaffer, MD   [facility] - A DEPT OF [facility] [address] [place] [address] Dept: [phone] Dept Fax: [phone]   HISTORY OF PRESENTING ILLNESS:   I have reviewed his chart and materials related to his cancer extensively and collaborated history with the patient. Summary of oncologic history is as follows:  ONCOLOGY HISTORY:  He was admitted to the hospital on 06/11/2024 after a fall at home that led to left hip fracture.  On arrival, labs showed platelet count of 5000 (likely lab error)   There was prominent right-sided neck mass and CT of the neck on 06/11/2024 showed soft tissue mass anchored along the right side of the oropharynx measuring 3.9 x 3.3 x 3.8 cm, extending into the glossotonsillar sulcus, concerning for oropharyngeal carcinoma.  Asymmetric right submandibular soft tissue, likely reflecting enlarged nodes and worrisome for metastatic disease.  Based on these findings, we were consulted for additional recommendations.   Patient was given 1 dose of prednisone  60 mg on 06/11/2024 and repeat labs on 06/12/2024 showed platelet count of 367,000, which was confirmed on repeat later in the day at 353,000.  This makes us  wonder if the initial lab was an error.  No additional intervention is needed for platelets at this time.   He does have normocytic anemia, likely anemia of chronic disease from CKD.   ENT was consulted during the hospital stay.  On 06/13/2024, Dr. Llewellyn performed biopsy of the right tonsil.  Pathology showed squamous cell carcinoma, p16  positive.  He also underwent left hip hemiarthroplasty on 07/13/2024.  He was eventually discharged to rehab on 06/16/2024.  Patient refused staging PET scan as he did not want any radioactive materials to be given.  He refused radiation oncology consult for similar reason.  On 06/30/2024, CT of the chest showed no evidence of metastatic disease in the chest.  There was left lower lobe airspace disease with associated air bronchograms, most constant with pneumonia.  Follow-up recommended to document clearing and exclude atypical primary thoracic malignancy.  Scattered small pulmonary nodules bilaterally, likely benign.  Indeterminate 1.9 cm lesion posteriorly in the upper pole of the left kidney, possibly a complex cyst.  Dedicated renal CT or MRI was recommended for further evaluation.  On his consultation with us  in our clinic on 07/17/2024, patient refused to pursue chemotherapy.  He was agreeable to considering radiation therapy alone.  Will arrange appointments with Dr. Izell.  For further evaluation of left kidney lesion, MRI of the abdomen will be obtained.  Oncology History  Right tonsillar squamous cell carcinoma (HCC)  07/17/2024 Initial Diagnosis   Right tonsillar squamous cell carcinoma (HCC)   07/17/2024 Cancer Staging   Staging form: Pharynx - HPV-Mediated Oropharynx, AJCC 8th Edition - Clinical: Stage I (cT2, cN1, cM0, p16+) - Signed by Autumn Millman, MD on 07/17/2024     INTERVAL HISTORY:  Discussed the use of AI scribe software for clinical note transcription with the patient, who gave verbal consent to proceed.  History of Present Illness Michael Chang is a 78 year old male with tonsillar cancer who presents for follow-up after hospitalization and hip surgery. He is accompanied by his sister, [name].  He  was recently hospitalized and underwent hip surgery. During this stay, a neck mass was identified, and a biopsy confirmed tonsillar cancer on the right side, with  possible lymph node involvement. He was discharged from rehabilitation on October 3rd, 2025, and has been receiving physical therapy at home. He reports that he uses a cane at home and experiences shortness of breath with long distances.  A CT scan of the chest showed a possible pneumonia in the left lower lung, although he reports only a slight cough. Additionally, the scan incidentally found a cyst in the left kidney, which requires further investigation. He has chronic kidney disease with fluctuating creatinine levels, currently around 2.1 to 2.2, and is not a candidate for contrast imaging due to his kidney function.  He also has significant dental issues, with rotten teeth that may require extraction before any potential radiation treatment. He has not seen a dentist in over 20 years, and his sister notes that his teeth are in poor condition, describing them as 'all rotten' and 'falling out'.  His platelet count was noted to be low during hospitalization but normalized after steroid treatment. He has been cleared to eat solid foods after a swallow test conducted during his hospital stay.     MEDICAL HISTORY:  Past Medical History:  Diagnosis Date   Anemia    Anemia in chronic kidney disease 03/02/2020   CAD (coronary artery disease)    Cataract (lens) fragments in eye following cataract surgery, right eye 03/15/2020   Chronic combined systolic (congestive) and diastolic (congestive) heart failure (HCC)    Chronic ulcer of right lower extremity (HCC)    CKD (chronic kidney disease), stage III (HCC)    Essential hypertension    HLD (hyperlipidemia)    Near syncope 02/14/2020   NSTEMI (non-ST elevated myocardial infarction) (HCC)    PVD (peripheral vascular disease)     SURGICAL HISTORY: Past Surgical History:  Procedure Laterality Date   COLONOSCOPY     x2   DIRECT LARYNGOSCOPY N/A 06/13/2024   Procedure: LARYNGOSCOPY, DIRECT;  Surgeon: Llewellyn Gerard LABOR, DO;  Location: [facility] OR;   Service: ENT;  Laterality: N/A;  Direct Laryngoscopy with Biopsy   HIP ARTHROPLASTY Left 06/13/2024   Procedure: HEMIARTHROPLASTY (BIPOLAR) HIP, POSTERIOR APPROACH FOR FRACTURE;  Surgeon: Beverley Evalene BIRCH, MD;  Location: [facility] OR;  Service: Orthopedics;  Laterality: Left;    SOCIAL HISTORY: He reports that he has never smoked. He has never used smokeless tobacco. He reports that he does not currently use alcohol. He reports that he does not currently use drugs after having used the following drugs: Heroin and Cocaine. Social History   Socioeconomic History   Marital status: Single    Spouse name: Not on file   Number of children: Not on file   Years of education: Not on file   Highest education level: Not on file  Occupational History   Not on file  Tobacco Use   Smoking status: Never   Smokeless tobacco: Never  Substance and Sexual Activity   Alcohol use: Not Currently   Drug use: Not Currently    Types: Heroin, Cocaine   Sexual activity: Not Currently  Other Topics Concern   Not on file  Social History Narrative   Not on file   Social Drivers of Health   Financial Resource Strain: High Risk (10/31/2023)   Overall Financial Resource Strain (CARDIA)    Difficulty of Paying Living Expenses: Very hard  Food Insecurity: No Food Insecurity (06/12/2024)  Hunger Vital Sign    Worried About Running Out of Food in the Last Year: Never true    Ran Out of Food in the Last Year: Never true  Transportation Needs: No Transportation Needs (06/12/2024)   PRAPARE - Administrator, Civil Service (Medical): No    Lack of Transportation (Non-Medical): No  Physical Activity: Sufficiently Active (10/31/2023)   Exercise Vital Sign    Days of Exercise per Week: 4 days    Minutes of Exercise per Session: 40 min  Stress: No Stress Concern Present (10/31/2023)   [employer] of Occupational Health - Occupational Stress Questionnaire    Feeling of Stress : Not at all  Social  Connections: Socially Isolated (06/12/2024)   Social Connection and Isolation Panel    Frequency of Communication with Friends and Family: Once a week    Frequency of Social Gatherings with Friends and Family: Never    Attends Religious Services: Never    Database administrator or Organizations: No    Attends Banker Meetings: Never    Marital Status: Never married  Intimate Partner Violence: Not At Risk (06/12/2024)   Humiliation, Afraid, Rape, and Kick questionnaire    Fear of Current or Ex-Partner: No    Emotionally Abused: No    Physically Abused: No    Sexually Abused: No    FAMILY HISTORY: Family History  Problem Relation Age of Onset   Dementia Mother    Diabetes Mellitus II Father     ALLERGIES:  He has no known allergies.  MEDICATIONS:  Current Outpatient Medications  Medication Sig Dispense Refill   amLODipine  (NORVASC ) 10 MG tablet Take 10 mg by mouth daily.     aspirin  EC 81 MG tablet Take 1 tablet (81 mg total) by mouth 2 (two) times daily. To prevent blood clots for 30 days after surgery. 60 tablet 0   carvedilol  (COREG ) 25 MG tablet Take 12.5-25 mg by mouth 2 (two) times daily with a meal. Take 12.5mg  (one-half tablet) in the morning and 25mg  (one tablet) in the evening for a total daily dose of 37.5mg .     ferrous sulfate  325 (65 FE) MG tablet Take 325 mg by mouth daily with breakfast.     HYDROcodone -acetaminophen  (NORCO/VICODIN) 5-325 MG tablet Take 1 tablet by mouth every 6 (six) hours as needed for severe pain (pain score 7-10). in hip after surgery 20 tablet 0   isosorbide  mononitrate (IMDUR ) 30 MG 24 hr tablet Take 2 tablets (60 mg total) by mouth daily.     Multiple Vitamins-Minerals (MULTIVITAMIN WITH MINERALS) tablet Take 1 tablet by mouth daily.     No current facility-administered medications for this visit.    REVIEW OF SYSTEMS:    Review of Systems - Oncology  All other pertinent systems were reviewed with the patient and are  negative.  PHYSICAL EXAMINATION:    Onc Performance Status - 07/20/24 1600       ECOG Perf Status   ECOG Perf Status Restricted in physically strenuous activity but ambulatory and able to carry out work of a light or sedentary nature, e.g., light house work, office work      KPS SCALE   KPS % SCORE Able to carry on normal activity, minor s/s of disease          Vitals:   07/17/24 1134  BP: 117/75  Pulse: 65  Resp: 16  Temp: 97.9 F (36.6 C)  SpO2: 100%   Filed Weights  07/17/24 1134  Weight: 129 lb 8 oz (58.7 kg)    Physical Exam Constitutional:      General: He is not in acute distress.    Appearance: Normal appearance.  HENT:     Head: Normocephalic and atraumatic.     Mouth/Throat:     Comments: Right-sided tonsillar mass, crossing the midline  Poor dentition Eyes:     Conjunctiva/sclera: Conjunctivae normal.  Neck:     Comments: Some prominence in the right submandibular region Cardiovascular:     Rate and Rhythm: Normal rate and regular rhythm.  Pulmonary:     Effort: Pulmonary effort is normal. No respiratory distress.  Abdominal:     General: There is no distension.  Neurological:     General: No focal deficit present.     Mental Status: He is alert and oriented to person, place, and time.  Psychiatric:        Mood and Affect: Mood normal.        Behavior: Behavior normal.     LABORATORY DATA:   I have reviewed the data as listed.  Results for orders placed or performed in visit on 07/17/24  TSH  Result Value Ref Range   TSH 3.990 0.350 - 4.500 uIU/mL  CMP (Cancer Center only)  Result Value Ref Range   Sodium 140 135 - 145 mmol/L   Potassium 4.3 3.5 - 5.1 mmol/L   Chloride 106 98 - 111 mmol/L   CO2 29 22 - 32 mmol/L   Glucose, Bld 97 70 - 99 mg/dL   BUN 20 8 - 23 mg/dL   Creatinine 8.09 (H) 9.38 - 1.24 mg/dL   Calcium 9.0 8.9 - 89.6 mg/dL   Total Protein 7.9 6.5 - 8.1 g/dL   Albumin 3.0 (L) 3.5 - 5.0 g/dL   AST 11 (L) 15 - 41  U/L   ALT 5 0 - 44 U/L   Alkaline Phosphatase 56 38 - 126 U/L   Total Bilirubin 0.2 0.0 - 1.2 mg/dL   GFR, Estimated 36 (L) >60 mL/min   Anion gap 5 5 - 15  CBC with Differential (Cancer Center Only)  Result Value Ref Range   WBC Count 4.8 4.0 - 10.5 K/uL   RBC 3.03 (L) 4.22 - 5.81 MIL/uL   Hemoglobin 8.2 (L) 13.0 - 17.0 g/dL   HCT 73.7 (L) 60.9 - 47.9 %   MCV 86.5 80.0 - 100.0 fL   MCH 27.1 26.0 - 34.0 pg   MCHC 31.3 30.0 - 36.0 g/dL   RDW 85.3 88.4 - 84.4 %   Platelet Count 417 (H) 150 - 400 K/uL   nRBC 0.0 0.0 - 0.2 %   Neutrophils Relative % 52 %   Neutro Abs 2.4 1.7 - 7.7 K/uL   Lymphocytes Relative 34 %   Lymphs Abs 1.6 0.7 - 4.0 K/uL   Monocytes Relative 11 %   Monocytes Absolute 0.5 0.1 - 1.0 K/uL   Eosinophils Relative 3 %   Eosinophils Absolute 0.1 0.0 - 0.5 K/uL   Basophils Relative 0 %   Basophils Absolute 0.0 0.0 - 0.1 K/uL   Immature Granulocytes 0 %   Abs Immature Granulocytes 0.01 0.00 - 0.07 K/uL     RADIOGRAPHIC STUDIES:  I have personally reviewed the radiological images as listed and agree with the findings in the report.  CT Chest W Contrast Result Date: 06/30/2024 CLINICAL DATA:  Head and neck cancer staging. Squamous cell carcinoma of tonsil. * Tracking Code: BO *  EXAM: CT CHEST WITH CONTRAST TECHNIQUE: Multidetector CT imaging of the chest was performed during intravenous contrast administration. RADIATION DOSE REDUCTION: This exam was performed according to the departmental dose-optimization program which includes automated exposure control, adjustment of the mA and/or kV according to patient size and/or use of iterative reconstruction technique. CONTRAST:  60mL OMNIPAQUE IOHEXOL 300 MG/ML  SOLN COMPARISON:  CT neck 06/11/2024. Renal ultrasound 03/08/2020. Chest radiographs 02/14/2020. FINDINGS: Cardiovascular: No acute vascular findings are demonstrated. Extensive coronary artery atherosclerosis with lesser involvement of the aorta and great vessels.  The heart size is normal. There is no pericardial effusion. Mediastinum/Nodes: There are no enlarged mediastinal, hilar or axillary lymph nodes.Mild distal esophageal wall thickening. The trachea and thyroid  gland appear unremarkable. Lungs/Pleura: No pleural effusion or pneumothorax. Focal airspace disease in the left lower lobe with associated air bronchograms, most consistent with pneumonia. There are scattered small pulmonary nodules, measuring up to 5 mm anteriorly in the right lower lobe on image 127/7. Upper abdomen: No acute findings are identified in the visualized upper abdomen. There are scattered low-density hepatic lesions which are likely cysts. Heterogeneous renal enhancement scattered cysts. There is a 1.9 cm lesion posteriorly in the upper pole of the left kidney which has a density of 56 HU, indeterminate, without clear corresponding cyst on previous ultrasound. Musculoskeletal/Chest wall: There is no chest wall mass or suspicious osseous finding. Mild spondylosis. Moderate bilateral gynecomastia. IMPRESSION: 1. No evidence of metastatic disease in the chest. 2. Left lower lobe airspace disease with associated air bronchograms, most consistent with pneumonia. Radiographic follow-up recommended to document clearing and exclude atypical primary thoracic malignancy. 3. Scattered small pulmonary nodules bilaterally, likely benign. Fleischner criteria do not apply. Recommend attention on follow-up. 4. Indeterminate 1.9 cm lesion posteriorly in the upper pole of the left kidney, possibly a complex cyst. Recommend further evaluation with dedicated renal CT or MRI (without and with contrast). 5.  Aortic Atherosclerosis (ICD10-I70.0). Electronically Signed   By: Elsie Perone M.D.   On: 06/30/2024 12:28    Orders Placed This Encounter  Procedures   MR Abdomen Wo Contrast    Standing Status:   Future    Expected Date:   07/27/2024    Expiration Date:   07/20/2025    What is the patient's sedation  requirement?:   No Sedation    Does the patient have a pacemaker or implanted devices?:   No    Preferred imaging location?:   [facility] (table limit - 500lbs)    CODE STATUS:  Code Status History     Date Active Date Inactive Code Status Order ID Comments User Context   06/11/2024 1709 06/16/2024 2055 Limited: Do not attempt resuscitation (DNR) -DNR-LIMITED -Do Not Intubate/DNI  [id number]  Odell Celinda Balo, MD ED   06/11/2024 1645 06/11/2024 1709 Limited: Do not attempt resuscitation (DNR) -DNR-LIMITED -Do Not Intubate/DNI  [id number]  Odell Celinda Balo, MD ED   02/14/2020 0740 02/15/2020 1735 Full Code [id number]  Lanetta Lingo, MD ED    Questions for Most Recent Historical Code Status (Order [id number])     Question Answer   If pulseless and not breathing No CPR or chest compressions.   In Pre-Arrest Conditions (Patient Is Breathing and Has A Pulse) Do not intubate. Provide all appropriate non-invasive medical interventions. Avoid ICU transfer unless indicated or required.   Consent: Discussion documented in EHR or advanced directives reviewed            Future Appointments  Date Time Provider  Department Center  08/20/2024  1:00 PM [facility] 3 [facility] H[facility]V  11/02/2024 11:30 AM [facility] ANNUAL WELLNESS VISIT 1 [facility] [address]     I spent a total of 50 minutes during this encounter with the patient including review of chart and various tests results, discussions about plan of care and coordination of care plan.  This document was completed utilizing speech recognition software. Grammatical errors, random word insertions, pronoun errors, and incomplete sentences are an occasional consequence of this system due to software limitations, ambient noise, and hardware issues. Any formal questions or concerns about the content, text or information contained within the body of this dictation should be directly addressed to the provider for clarification.

## 2024-07-17 NOTE — Progress Notes (Signed)
 Oncology Nurse Navigator Documentation   Met with patient during initial consult with Dr. Autumn. He was accompanied by his sister.  Further introduced myself as his/their Navigator, explained my role as a member of the Care Team. Provided New Patient resource guide binder: Contact information for physicians, this navigator, other members of the Care Team Advance Directive information; provided [facility] AD booklet at their request,  Fall Prevention Patient Safety Plan Financial Assistance Information sheet Symptom Management Clinic information [facility] campus map with highlight of [facility] SLP Information sheet Head and Neck cancer basics Nutrition information Patient and family support information including Spiritual care/Chaplain information, Peer mentor program, health and wellness classes, and the survivorship program Community resources  He has declined chemotherapy but is now willing to consider radiation therapy. Another referral has been placed to radiation oncology for Dr. Izell.  Dental evaluation was discussed with Mr. Heathcock and his sister. I will refer to Dr. Danial for dental evaluation and possible SPDs.  They verbalized understanding of information provided. I encouraged them to call with questions/concerns moving forward.  Delon Jefferson, RN, BSN, OCN Head & Neck Oncology Nurse Navigator [facility] [place] [phone]

## 2024-07-20 ENCOUNTER — Encounter: Payer: Self-pay | Admitting: Oncology

## 2024-07-20 NOTE — Assessment & Plan Note (Addendum)
 Please review oncology history for additional details and timeline of events.  Confirmed right tonsillar squamous cell carcinoma with possible cervical lymph node involvement. CT scan of the chest showed no evidence of metastasis. He declined PET scan due to concerns about radioactive contrast.  Discussed diagnosis, tentative staging, prognosis, plan of care and treatment options.  Reviewed NCCN guidelines.  Recommended concurrent chemoradiation with docetaxel or carboplatin/paclitaxel weekly during the course of radiation.  He is not interested in chemotherapy or radiation therapy at this time.   Surgical option discussed involves removal of the tonsils and lymph nodes, but no guarantee of complete resection.   He was advised to consider radiation therapy alone at least to avoid progression of disease, but he is hesitant. Risks of untreated cancer include progression to other organs, difficulty swallowing, weight loss, and potential breathing issues. His sister is involved in decision-making process.  - Refer to Dr. Izell, radiation oncologist, for discussion of radiation therapy options  - Discuss dental evaluation and potential extractions with Dr. Danial prior to radiation therapy  We will see him in clinic for follow-up depending on when and if he pursues radiation therapies.

## 2024-07-27 ENCOUNTER — Ambulatory Visit (HOSPITAL_COMMUNITY): Admission: RE | Admit: 2024-07-27 | Source: Ambulatory Visit

## 2024-07-27 NOTE — Progress Notes (Signed)
 Radiation Oncology         [phone] ________________________________  Initial outpatient Consultation  Name: Michael Chang MRN: [id number]  Date: 07/28/2024  DOB: 1946/03/26  RR:Rojmx, Comer POUR, NP  Autumn Millman, MD   REFERRING PHYSICIAN: Autumn Millman, MD  DIAGNOSIS: No diagnosis found.  Squamous cell carcinoma of the right tonsil; p16 positive    Cancer Staging  Right tonsillar squamous cell carcinoma (HCC) Staging form: Pharynx - HPV-Mediated Oropharynx, AJCC 8th Edition - Clinical: Stage I (cT2, cN1, cM0, p16+) - Signed by Autumn Millman, MD on 07/17/2024   CHIEF COMPLAINT: Here to discuss management of tonsillar cancer  HISTORY OF PRESENT ILLNESS::Michael Chang is a 78 y.o. male whomale who was brought to the ED via [facility] on 06/11/24 after suffering a fall two days prior which resulted in a left hip fracture.   In the ED, patient further endorsed voice changes and difficulties swallowing which prompted a soft tissue neck CT that demonstrated: a soft tissue mass anchored along the right side of the oropharynx, measuring 3.9 x 3.3 x 3.8 cm, extending into the glossotonsillar sulcus, and asymmetric right submandibular soft tissue, likely reflecting enlarged nodes and worrisome for metastatic disease. A CT of the head and cervical spine was also performed which also partially demonstrated asymmetric soft tissue involving the right palatine tonsil and soft palate. Imaging otherwise showed no acute intracranial abnormalities or acute trauma to the cervical spine.    Subsequently, he was admitted for inpatient care till 06/16/24. During his stay, ENT was consulted for further management. He then underwent biopsies of the right tonsil on 06/13/24. Pathology showed findings consistent with squamous cell carcinoma; p16 positive.   Barium swallow study on was also conducted on 06/15/24: oropharyngeal dysphagia with DIGEST score of 3; with incomplete velopharyngeal closure, base of  tongue retraction, pharyngeal squeeze, and anterior hyoid excursion.  Most recent follow up with Dr. Autumn on 10/17 to discuss further treatment plan, patient declined to undergo a  PET scan due to concerns about radioactive contrast. During his visit, patient expressed disinterest in chemotherapy or radiation therapy but was advised by Dr. Autumn to consider radiation to avoid disease progression.        Of note: Due his hip fracture, ortho-surgery was consulted and he underwent a left hemiarthroplasty on 06/14/2024.     Swallowing issues, if any: dysphagia   Weight Changes: ***   Pain status: ***   Other symptoms: voice changes    Tobacco history, if any: none   Drug abuse history: remote history of heroin and cocaine use many years ago (has not used any drugs for at least 10 years)    ETOH abuse, if any: none    Prior cancers, if any: none   PREVIOUS RADIATION THERAPY: No  PAST MEDICAL HISTORY:  has a past medical history of Anemia, Anemia in chronic kidney disease (03/02/2020), CAD (coronary artery disease), Cataract (lens) fragments in eye following cataract surgery, right eye (03/15/2020), Chronic combined systolic (congestive) and diastolic (congestive) heart failure (HCC), Chronic ulcer of right lower extremity (HCC), CKD (chronic kidney disease), stage III (HCC), Essential hypertension, HLD (hyperlipidemia), Near syncope (02/14/2020), NSTEMI (non-ST elevated myocardial infarction) (HCC), and PVD (peripheral vascular disease).    PAST SURGICAL HISTORY: Past Surgical History:  Procedure Laterality Date   COLONOSCOPY     x2   DIRECT LARYNGOSCOPY N/A 06/13/2024   Procedure: LARYNGOSCOPY, DIRECT;  Surgeon: Llewellyn Gerard LABOR, DO;  Location: [facility] OR;  Service: ENT;  Laterality: N/A;  Direct Laryngoscopy with Biopsy   HIP ARTHROPLASTY Left 06/13/2024   Procedure: HEMIARTHROPLASTY (BIPOLAR) HIP, POSTERIOR APPROACH FOR FRACTURE;  Surgeon: Beverley Evalene BIRCH, MD;  Location: [facility] OR;   Service: Orthopedics;  Laterality: Left;    FAMILY HISTORY: family history includes Dementia in his mother; Diabetes Mellitus II in his father.  SOCIAL HISTORY:  reports that he has never smoked. He has never used smokeless tobacco. He reports that he does not currently use alcohol. He reports that he does not currently use drugs after having used the following drugs: Heroin and Cocaine.  ALLERGIES: Patient has no known allergies.  MEDICATIONS:  Current Outpatient Medications  Medication Sig Dispense Refill   amLODipine  (NORVASC ) 10 MG tablet Take 10 mg by mouth daily.     aspirin  EC 81 MG tablet Take 1 tablet (81 mg total) by mouth 2 (two) times daily. To prevent blood clots for 30 days after surgery. 60 tablet 0   carvedilol  (COREG ) 25 MG tablet Take 12.5-25 mg by mouth 2 (two) times daily with a meal. Take 12.5mg  (one-half tablet) in the morning and 25mg  (one tablet) in the evening for a total daily dose of 37.5mg .     ferrous sulfate  325 (65 FE) MG tablet Take 325 mg by mouth daily with breakfast.     HYDROcodone -acetaminophen  (NORCO/VICODIN) 5-325 MG tablet Take 1 tablet by mouth every 6 (six) hours as needed for severe pain (pain score 7-10). in hip after surgery 20 tablet 0   isosorbide  mononitrate (IMDUR ) 30 MG 24 hr tablet Take 2 tablets (60 mg total) by mouth daily.     Multiple Vitamins-Minerals (MULTIVITAMIN WITH MINERALS) tablet Take 1 tablet by mouth daily.     No current facility-administered medications for this visit.    REVIEW OF SYSTEMS:  Notable for that above.   PHYSICAL EXAM:  vitals were not taken for this visit.   General: Alert and oriented, in no acute distress HEENT: Head is normocephalic. Extraocular movements are intact. Oropharynx is notable for ***. Neck: Neck is notable for *** Heart: Regular in rate and rhythm with no murmurs, rubs, or gallops. Chest: Clear to auscultation bilaterally, with no rhonchi, wheezes, or rales. Abdomen: Soft, nontender,  nondistended, with no rigidity or guarding. Extremities: No cyanosis or edema. Lymphatics: see Neck Exam Skin: No concerning lesions. Musculoskeletal: symmetric strength and muscle tone throughout. Neurologic: Cranial nerves II through XII are grossly intact. No obvious focalities. Speech is fluent. Coordination is intact. Psychiatric: Judgment and insight are intact. Affect is appropriate.   ECOG = ***  0 - Asymptomatic (Fully active, able to carry on all predisease activities without restriction)  1 - Symptomatic but completely ambulatory (Restricted in physically strenuous activity but ambulatory and able to carry out work of a light or sedentary nature. For example, light housework, office work)  2 - Symptomatic, <50% in bed during the day (Ambulatory and capable of all self care but unable to carry out any work activities. Up and about more than 50% of waking hours)  3 - Symptomatic, >50% in bed, but not bedbound (Capable of only limited self-care, confined to bed or chair 50% or more of waking hours)  4 - Bedbound (Completely disabled. Cannot carry on any self-care. Totally confined to bed or chair)  5 - Death   Raylene MM, Creech RH, Tormey DC, et al. [phone]). Toxicity and response criteria of the [facility]. Am. DOROTHA Bridges. Oncol. 5 (6): 649-55   LABORATORY DATA:  Lab Results  Component Value Date   WBC 4.8 07/17/2024   HGB 8.2 (L) 07/17/2024   HCT 26.2 (L) 07/17/2024   MCV 86.5 07/17/2024   PLT 417 (H) 07/17/2024   CMP     Component Value Date/Time   NA 140 07/17/2024 1112   K 4.3 07/17/2024 1112   CL 106 07/17/2024 1112   CO2 29 07/17/2024 1112   GLUCOSE 97 07/17/2024 1112   BUN 20 07/17/2024 1112   CREATININE 1.90 (H) 07/17/2024 1112   CALCIUM 9.0 07/17/2024 1112   PROT 7.9 07/17/2024 1112   ALBUMIN 3.0 (L) 07/17/2024 1112   AST 11 (L) 07/17/2024 1112   ALT 5 07/17/2024 1112   ALKPHOS 56 07/17/2024 1112   BILITOT 0.2 07/17/2024 1112    GFRNONAA 36 (L) 07/17/2024 1112   GFRAA 36 (L) 02/15/2020 0516      Lab Results  Component Value Date   TSH 3.990 07/17/2024     RADIOGRAPHY: CT Chest W Contrast Result Date: 06/30/2024 CLINICAL DATA:  Head and neck cancer staging. Squamous cell carcinoma of tonsil. * Tracking Code: BO * EXAM: CT CHEST WITH CONTRAST TECHNIQUE: Multidetector CT imaging of the chest was performed during intravenous contrast administration. RADIATION DOSE REDUCTION: This exam was performed according to the departmental dose-optimization program which includes automated exposure control, adjustment of the mA and/or kV according to patient size and/or use of iterative reconstruction technique. CONTRAST:  60mL OMNIPAQUE IOHEXOL 300 MG/ML  SOLN COMPARISON:  CT neck 06/11/2024. Renal ultrasound 03/08/2020. Chest radiographs 02/14/2020. FINDINGS: Cardiovascular: No acute vascular findings are demonstrated. Extensive coronary artery atherosclerosis with lesser involvement of the aorta and great vessels. The heart size is normal. There is no pericardial effusion. Mediastinum/Nodes: There are no enlarged mediastinal, hilar or axillary lymph nodes.Mild distal esophageal wall thickening. The trachea and thyroid  gland appear unremarkable. Lungs/Pleura: No pleural effusion or pneumothorax. Focal airspace disease in the left lower lobe with associated air bronchograms, most consistent with pneumonia. There are scattered small pulmonary nodules, measuring up to 5 mm anteriorly in the right lower lobe on image 127/7. Upper abdomen: No acute findings are identified in the visualized upper abdomen. There are scattered low-density hepatic lesions which are likely cysts. Heterogeneous renal enhancement scattered cysts. There is a 1.9 cm lesion posteriorly in the upper pole of the left kidney which has a density of 56 HU, indeterminate, without clear corresponding cyst on previous ultrasound. Musculoskeletal/Chest wall: There is no chest wall  mass or suspicious osseous finding. Mild spondylosis. Moderate bilateral gynecomastia. IMPRESSION: 1. No evidence of metastatic disease in the chest. 2. Left lower lobe airspace disease with associated air bronchograms, most consistent with pneumonia. Radiographic follow-up recommended to document clearing and exclude atypical primary thoracic malignancy. 3. Scattered small pulmonary nodules bilaterally, likely benign. Fleischner criteria do not apply. Recommend attention on follow-up. 4. Indeterminate 1.9 cm lesion posteriorly in the upper pole of the left kidney, possibly a complex cyst. Recommend further evaluation with dedicated renal CT or MRI (without and with contrast). 5.  Aortic Atherosclerosis (ICD10-I70.0). Electronically Signed   By: Elsie Perone M.D.   On: 06/30/2024 12:28      IMPRESSION/PLAN:  This is a delightful patient with head and neck cancer. I *** recommend radiotherapy for this patient.  We discussed the potential risks, benefits, and side effects of radiotherapy. We talked in detail about acute and late effects. We discussed that some of the most bothersome acute effects may be mucositis, dysgeusia, salivary changes, skin irritation, hair loss, dehydration,  weight loss and fatigue. We talked about late effects which include but are not necessarily limited to dysphagia, hypothyroidism, nerve injury, vascular injury, spinal cord injury, xerostomia, trismus, neck edema, dental issues, non-healing wound, and potentially fatal injury to any of the tissues in the head and neck region. No guarantees of treatment were given. A consent form was signed and placed in the patient's medical record. The patient is enthusiastic about proceeding with treatment. I look forward to participating in the patient's care.    Simulation (treatment planning) will take place ***  We also discussed that the treatment of head and neck cancer is a multidisciplinary process to maximize treatment outcomes and  quality of life. For this reason the following referrals have been or will be made:  *** Medical oncology to discuss chemotherapy   *** Dentistry for dental evaluation, possible extractions in the radiation fields, and /or advice on reducing risk of cavities, osteoradionecrosis, or other oral issues.  *** Nutritionist for nutrition support during and after treatment.  *** Speech language pathology for swallowing and/or speech therapy.  *** Social work for social support.   *** Physical therapy due to risk of lymphedema in neck and deconditioning.  *** Baseline labs including TSH.  On date of service, in total, I spent *** minutes on this encounter. Patient was seen in person.  __________________________________________   Lauraine Golden, MD  This document serves as a record of services personally performed by Lynwood Nasuti, MD. It was created on his behalf by Reymundo Cartwright, a trained medical scribe. The creation of this record is based on the scribe's personal observations and the provider's statements to them. This document has been checked and approved by the attending provider.

## 2024-07-28 ENCOUNTER — Ambulatory Visit
Admission: RE | Admit: 2024-07-28 | Discharge: 2024-07-28 | Disposition: A | Discharge status: Home / Self Care | Source: Ambulatory Visit | Attending: Radiation Oncology | Admitting: Radiation Oncology

## 2024-07-28 ENCOUNTER — Other Ambulatory Visit: Payer: Self-pay

## 2024-07-28 ENCOUNTER — Encounter: Payer: Self-pay | Admitting: Radiation Oncology

## 2024-07-28 VITALS — BP 155/85 | HR 72 | Temp 97.8°F | Resp 20 | Ht 73.0 in | Wt 127.8 lb

## 2024-07-28 DIAGNOSIS — I252 Old myocardial infarction: Secondary | ICD-10-CM | POA: Insufficient documentation

## 2024-07-28 DIAGNOSIS — C090 Malignant neoplasm of tonsillar fossa: Secondary | ICD-10-CM | POA: Diagnosis present

## 2024-07-28 DIAGNOSIS — N62 Hypertrophy of breast: Secondary | ICD-10-CM | POA: Insufficient documentation

## 2024-07-28 DIAGNOSIS — R918 Other nonspecific abnormal finding of lung field: Secondary | ICD-10-CM | POA: Diagnosis not present

## 2024-07-28 DIAGNOSIS — I739 Peripheral vascular disease, unspecified: Secondary | ICD-10-CM | POA: Insufficient documentation

## 2024-07-28 DIAGNOSIS — I2510 Atherosclerotic heart disease of native coronary artery without angina pectoris: Secondary | ICD-10-CM | POA: Insufficient documentation

## 2024-07-28 DIAGNOSIS — F1911 Other psychoactive substance abuse, in remission: Secondary | ICD-10-CM | POA: Diagnosis not present

## 2024-07-28 DIAGNOSIS — I700 Atherosclerosis of aorta: Secondary | ICD-10-CM | POA: Insufficient documentation

## 2024-07-28 DIAGNOSIS — I5042 Chronic combined systolic (congestive) and diastolic (congestive) heart failure: Secondary | ICD-10-CM | POA: Insufficient documentation

## 2024-07-28 DIAGNOSIS — Z7982 Long term (current) use of aspirin: Secondary | ICD-10-CM | POA: Insufficient documentation

## 2024-07-28 DIAGNOSIS — M479 Spondylosis, unspecified: Secondary | ICD-10-CM | POA: Insufficient documentation

## 2024-07-28 DIAGNOSIS — E785 Hyperlipidemia, unspecified: Secondary | ICD-10-CM | POA: Insufficient documentation

## 2024-07-28 DIAGNOSIS — Z79899 Other long term (current) drug therapy: Secondary | ICD-10-CM | POA: Diagnosis not present

## 2024-07-28 DIAGNOSIS — F1491 Cocaine use, unspecified, in remission: Secondary | ICD-10-CM | POA: Diagnosis not present

## 2024-07-28 DIAGNOSIS — I130 Hypertensive heart and chronic kidney disease with heart failure and stage 1 through stage 4 chronic kidney disease, or unspecified chronic kidney disease: Secondary | ICD-10-CM | POA: Insufficient documentation

## 2024-07-28 DIAGNOSIS — C099 Malignant neoplasm of tonsil, unspecified: Secondary | ICD-10-CM

## 2024-07-28 DIAGNOSIS — D631 Anemia in chronic kidney disease: Secondary | ICD-10-CM | POA: Insufficient documentation

## 2024-07-28 DIAGNOSIS — N183 Chronic kidney disease, stage 3 unspecified: Secondary | ICD-10-CM | POA: Insufficient documentation

## 2024-07-28 NOTE — Progress Notes (Signed)
 Head and Neck Cancer Location of Tumor / Histology:  Squamous Cell Carcinoma of Tonsil   Patient presented months ago with symptoms of:  Patient was having some voice hoarseness. Some mild issues with swallowing. 06/11/2024 CT Soft Tissue Neck Wo Contrast  CT Chest W Contrast  Biopsies revealed:     Nutrition Status Yes No Comments  Weight changes? [x]   []    Patient lost weight in the hospital  Swallowing concerns? [x]   []      PEG? []   [x]        Referrals Yes No Comments  Social Work? [x]   []      Dentistry? [x]   []      Swallowing therapy? [x]   []      Nutrition? [x]   []      Med/Onc? [x]   []        Safety Issues Yes No Comments  Prior radiation? []   [x]      Pacemaker/ICD? []   [x]      Possible current pregnancy? []   [x]      Is the patient on methotrexate? []   [x]        Tobacco/Marijuana/Snuff/ETOH use: None   Past/Anticipated interventions by otolaryngology, if any:    Past/Anticipated interventions by medical oncology, if any:          Current Complaints / other details:  None

## 2024-07-28 NOTE — Progress Notes (Signed)
 Oncology Nurse Navigator Documentation   Met with patient during initial consult with Dr. Izell.  He was accompanied by his sister, [name].  Further introduced myself as his/their Navigator, explained my role as a member of the Care Team. Assisted with post-consult appt scheduling. PET scan has been ordered. I will follow for when it's scheduled.  Dr. Izell discussed dental referral to Dr. Danial for dental clearance and possible SPDs before radiation could begin. He is agreeable and a referral has been placed.     They verbalized understanding of information provided. I encouraged them to call with questions/concerns moving forward.  Michael Jefferson, RN, BSN, OCN Head & Neck Oncology Nurse Navigator [facility] [place] [phone]

## 2024-07-29 ENCOUNTER — Other Ambulatory Visit (HOSPITAL_COMMUNITY)

## 2024-07-29 NOTE — Progress Notes (Signed)
 Dental Form with Estimates of Radiation Dose  Michael Chang Date of birth: 12/17/1945   Diagnosis:    ICD-10-CM   1. Cancer of tonsillar fossa (HCC)  C09.0       Cancer Staging  Right tonsillar squamous cell carcinoma (HCC) Staging form: Pharynx - HPV-Mediated Oropharynx, AJCC 8th Edition - Clinical stage from 07/28/2024: Stage II (cT3, cN1, cM0, p16+) - Signed by Izell Domino, MD on 07/29/2024 Stage prefix: Initial diagnosis   Prognosis: pending PET, ?curable  Anticipated # of fractions: 20-35    Daily?: yes  # of weeks of radiotherapy: 4-7  Chemotherapy?: TBD  Anticipated xerostomia:  Mild permanent    Pre-simulation needs:   Scatter protection / extractions as needed/ dental advice. Pt has not seen dentist in decades  Simulation: ASAP   Other Notes:  Patient has some reservations about seeing an oral surgeon but is agreeable to see what your advice is. Thanks!  Please contact Domino Izell, MD, with patient's disposition after evaluation and/or dental treatment. -----------------------------------  Domino Izell, MD

## 2024-07-30 ENCOUNTER — Other Ambulatory Visit: Payer: Self-pay

## 2024-07-30 NOTE — Progress Notes (Signed)
 Oncology Nurse Navigator Documentation   I received a call from Michael Chang' sister, Michael Chang. She informed me that she just spoke with her brother on the phone and he informed her that he has decided to decline treatment for his head and neck cancer including a staging PET, dental referral, and radiation. He has also declined a MRI ordered by Dr. Autumn to work up a complex cyst to his kidney. I then called Michael Chang and he confirmed that he has declined treatment for his head and neck cancer as above. I explained that if he didn't receive treatment it's likely that his cancer will grow, cause pain, cause difficulty breathing and other symptoms. He voiced his understanding and awareness of the risks. I will notify Dr. Izell and Dr. Autumn. I have also cancelled his dental referral to Dr. Danial. Both Michael Chang and his sister have my number to call with any future questions or concerns.   Delon Jefferson RN, BSN, OCN Head & Neck Oncology Nurse Navigator [facility] Phone # [phone]  Fax # [phone]

## 2024-08-13 ENCOUNTER — Telehealth (HOSPITAL_COMMUNITY): Payer: Self-pay | Admitting: Physician Assistant

## 2024-08-13 NOTE — Telephone Encounter (Signed)
 Patient cancelled echocardiogram for the reason below:  08/13/2024 9:18 AM Ab:[facility], [name]  Cancel Rsn: Patient (pt doesn't want to go)   Order will be removed from the active echo WQ and if patient calls back we will reinstate the order. Thank you.

## 2024-08-20 ENCOUNTER — Encounter (HOSPITAL_COMMUNITY)

## 2024-08-20 ENCOUNTER — Ambulatory Visit (HOSPITAL_COMMUNITY): Admission: RE | Admit: 2024-08-20 | Source: Ambulatory Visit

## 2024-10-16 ENCOUNTER — Telehealth: Payer: Self-pay | Admitting: Primary Care

## 2024-10-16 NOTE — Telephone Encounter (Signed)
 Copied from CRM [id number]. Topic: General - Other >> Oct 16, 2024  3:23 PM Thersia BROCKS wrote: Reason for CRM: Newell called in stated she faxed over two home health orders yesterday for NP Comer Gaskins

## 2024-10-19 NOTE — Telephone Encounter (Signed)
 Orders have been printed for providers sig; placed in providers folder.

## 2024-11-02 ENCOUNTER — Ambulatory Visit: Payer: Medicare Other
